# Patient Record
Sex: Male | Born: 1950
Health system: Southern US, Community
[De-identification: ages and names within clinical notes are randomized; demographics above are authoritative.]

## PROBLEM LIST (undated history)

## (undated) DIAGNOSIS — E785 Hyperlipidemia, unspecified: Secondary | ICD-10-CM

## (undated) DIAGNOSIS — G4730 Sleep apnea, unspecified: Secondary | ICD-10-CM

## (undated) DIAGNOSIS — M6530 Trigger finger, unspecified finger: Secondary | ICD-10-CM

## (undated) DIAGNOSIS — I10 Essential (primary) hypertension: Secondary | ICD-10-CM

## (undated) DIAGNOSIS — D563 Thalassemia minor: Secondary | ICD-10-CM

## (undated) DIAGNOSIS — R9439 Abnormal result of other cardiovascular function study: Secondary | ICD-10-CM

## (undated) DIAGNOSIS — Z972 Presence of dental prosthetic device (complete) (partial): Secondary | ICD-10-CM

## (undated) DIAGNOSIS — Z955 Presence of coronary angioplasty implant and graft: Secondary | ICD-10-CM

## (undated) DIAGNOSIS — F0390 Unspecified dementia without behavioral disturbance: Secondary | ICD-10-CM

## (undated) DIAGNOSIS — N183 Chronic kidney disease, stage 3 unspecified: Secondary | ICD-10-CM

## (undated) DIAGNOSIS — I2510 Atherosclerotic heart disease of native coronary artery without angina pectoris: Secondary | ICD-10-CM

## (undated) DIAGNOSIS — I493 Ventricular premature depolarization: Secondary | ICD-10-CM

## (undated) DIAGNOSIS — Z974 Presence of external hearing-aid: Secondary | ICD-10-CM

## (undated) HISTORY — DX: Essential (primary) hypertension: I10

## (undated) HISTORY — PX: APPENDECTOMY: SHX54

## (undated) HISTORY — PX: JOINT REPLACEMENT: SHX530

## (undated) HISTORY — PX: CORONARY ARTERY BYPASS GRAFT: SHX141

## (undated) HISTORY — PX: VASECTOMY: SHX75

## (undated) HISTORY — DX: Trigger finger, unspecified finger: M65.30

## (undated) HISTORY — DX: Abnormal result of other cardiovascular function study: R94.39

## (undated) HISTORY — PX: CARPAL TUNNEL RELEASE: SHX101

## (undated) HISTORY — DX: Atherosclerotic heart disease of native coronary artery without angina pectoris: I25.10

## (undated) HISTORY — PX: REPLACEMENT TOTAL KNEE: SUR1224

## (undated) HISTORY — DX: Presence of coronary angioplasty implant and graft: Z95.5

## (undated) HISTORY — PX: EYE SURGERY: SHX253

## (undated) HISTORY — PX: TONSILLECTOMY: SUR1361

## (undated) HISTORY — DX: Hyperlipidemia, unspecified: E78.5

---

## 2007-12-08 HISTORY — PX: CORONARY ANGIOPLASTY: SHX604

## 2008-07-07 ENCOUNTER — Ambulatory Visit: Payer: Self-pay | Admitting: Internal Medicine

## 2008-07-11 ENCOUNTER — Ambulatory Visit: Payer: Self-pay | Admitting: Internal Medicine

## 2008-07-21 ENCOUNTER — Ambulatory Visit: Payer: Self-pay | Admitting: Internal Medicine

## 2008-07-28 ENCOUNTER — Ambulatory Visit: Payer: Self-pay | Admitting: Internal Medicine

## 2009-04-06 ENCOUNTER — Encounter: Payer: Self-pay | Admitting: Internal Medicine

## 2009-04-08 ENCOUNTER — Encounter: Payer: Self-pay | Admitting: Internal Medicine

## 2009-04-19 ENCOUNTER — Ambulatory Visit: Payer: Self-pay | Admitting: Internal Medicine

## 2009-04-25 ENCOUNTER — Ambulatory Visit: Payer: Self-pay | Admitting: Oncology

## 2009-05-03 LAB — MORPHOLOGY - CHCC SATELLITE

## 2009-05-03 LAB — CBC WITH DIFFERENTIAL (CANCER CENTER ONLY)
BASO#: 0 10*3/uL (ref 0.0–0.2)
EOS%: 2.8 % (ref 0.0–7.0)
HGB: 12.4 g/dL — ABNORMAL LOW (ref 13.0–17.1)
LYMPH#: 1.8 10*3/uL (ref 0.9–3.3)
MCH: 21.8 pg — ABNORMAL LOW (ref 28.0–33.4)
MCHC: 33.6 g/dL (ref 32.0–35.9)
MONO%: 7.3 % (ref 0.0–13.0)
NEUT#: 3.6 10*3/uL (ref 1.5–6.5)
Platelets: 263 10*3/uL (ref 145–400)
RBC: 5.69 10*6/uL (ref 4.20–5.70)

## 2009-05-03 LAB — CMP (CANCER CENTER ONLY)
Alkaline Phosphatase: 96 U/L — ABNORMAL HIGH (ref 26–84)
BUN, Bld: 14 mg/dL (ref 7–22)
Creat: 0.8 mg/dl (ref 0.6–1.2)
Glucose, Bld: 105 mg/dL (ref 73–118)
Total Bilirubin: 0.6 mg/dl (ref 0.20–1.60)

## 2009-05-07 LAB — IRON AND TIBC
%SAT: 23 % (ref 20–55)
Iron: 71 ug/dL (ref 42–165)
TIBC: 306 ug/dL (ref 215–435)
UIBC: 235 ug/dL

## 2009-05-07 LAB — HEMOGLOBINOPATHY EVALUATION
Hemoglobin Other: 0 % (ref 0.0–0.0)
Hgb A2 Quant: 5.2 % — ABNORMAL HIGH (ref 2.2–3.2)
Hgb A: 92.3 % — ABNORMAL LOW (ref 96.8–97.8)
Hgb S Quant: 0 % (ref 0.0–0.0)

## 2009-05-07 LAB — FERRITIN: Ferritin: 25 ng/mL (ref 22–322)

## 2009-05-09 ENCOUNTER — Encounter: Payer: Self-pay | Admitting: Internal Medicine

## 2009-05-15 ENCOUNTER — Inpatient Hospital Stay (HOSPITAL_COMMUNITY): Admission: RE | Admit: 2009-05-15 | Discharge: 2009-05-18 | Payer: Self-pay | Admitting: Orthopedic Surgery

## 2009-07-31 ENCOUNTER — Ambulatory Visit: Payer: Self-pay | Admitting: Oncology

## 2009-08-07 LAB — CBC WITH DIFFERENTIAL (CANCER CENTER ONLY)
BASO#: 0 10*3/uL (ref 0.0–0.2)
BASO%: 0.6 % (ref 0.0–2.0)
EOS%: 2.9 % (ref 0.0–7.0)
LYMPH#: 2 10*3/uL (ref 0.9–3.3)
MCH: 21.7 pg — ABNORMAL LOW (ref 28.0–33.4)
MCHC: 32.3 g/dL (ref 32.0–35.9)
MONO%: 8.4 % (ref 0.0–13.0)
NEUT#: 3.7 10*3/uL (ref 1.5–6.5)
NEUT%: 57 % (ref 40.0–80.0)
RDW: 15.6 % — ABNORMAL HIGH (ref 10.5–14.6)

## 2009-08-07 LAB — FERRITIN: Ferritin: 13 ng/mL — ABNORMAL LOW (ref 22–322)

## 2009-08-07 LAB — IRON AND TIBC
Iron: 47 ug/dL (ref 42–165)
UIBC: 265 ug/dL

## 2009-08-21 ENCOUNTER — Ambulatory Visit: Payer: Self-pay | Admitting: Internal Medicine

## 2009-09-11 ENCOUNTER — Encounter: Payer: Self-pay | Admitting: Cardiovascular Disease

## 2009-09-12 ENCOUNTER — Encounter: Payer: Self-pay | Admitting: Cardiovascular Disease

## 2009-11-16 ENCOUNTER — Ambulatory Visit: Payer: BC Managed Care – PPO | Admitting: Internal Medicine

## 2009-11-19 ENCOUNTER — Encounter: Payer: Self-pay | Admitting: Cardiovascular Disease

## 2009-11-22 DIAGNOSIS — E785 Hyperlipidemia, unspecified: Secondary | ICD-10-CM | POA: Insufficient documentation

## 2009-11-22 DIAGNOSIS — I10 Essential (primary) hypertension: Secondary | ICD-10-CM | POA: Insufficient documentation

## 2009-11-22 DIAGNOSIS — I2510 Atherosclerotic heart disease of native coronary artery without angina pectoris: Secondary | ICD-10-CM | POA: Insufficient documentation

## 2009-12-06 ENCOUNTER — Telehealth: Payer: Self-pay | Admitting: Cardiovascular Disease

## 2010-01-02 ENCOUNTER — Encounter: Payer: Self-pay | Admitting: Cardiovascular Disease

## 2010-01-03 ENCOUNTER — Encounter: Payer: Self-pay | Admitting: Cardiovascular Disease

## 2010-01-07 ENCOUNTER — Ambulatory Visit: Payer: Self-pay | Admitting: Cardiovascular Disease

## 2010-01-16 ENCOUNTER — Encounter: Payer: Self-pay | Admitting: Cardiovascular Disease

## 2010-01-24 ENCOUNTER — Ambulatory Visit: Payer: BC Managed Care – PPO | Admitting: Gastroenterology

## 2010-02-12 ENCOUNTER — Telehealth: Payer: Self-pay | Admitting: Cardiovascular Disease

## 2010-02-22 ENCOUNTER — Ambulatory Visit: Payer: Self-pay | Admitting: Oncology

## 2010-03-19 ENCOUNTER — Emergency Department: Payer: BC Managed Care – PPO | Admitting: Emergency Medicine

## 2010-03-29 ENCOUNTER — Encounter: Payer: Self-pay | Admitting: Cardiovascular Disease

## 2010-04-17 ENCOUNTER — Ambulatory Visit: Payer: Self-pay | Admitting: Cardiovascular Disease

## 2010-05-08 ENCOUNTER — Telehealth: Payer: Self-pay | Admitting: Cardiovascular Disease

## 2010-05-23 ENCOUNTER — Telehealth: Payer: Self-pay | Admitting: Cardiovascular Disease

## 2010-05-23 ENCOUNTER — Ambulatory Visit: Payer: BC Managed Care – PPO | Admitting: Internal Medicine

## 2010-05-31 ENCOUNTER — Ambulatory Visit: Payer: BC Managed Care – PPO | Admitting: Internal Medicine

## 2010-08-16 ENCOUNTER — Telehealth: Payer: Self-pay | Admitting: Cardiovascular Disease

## 2010-08-19 ENCOUNTER — Encounter: Payer: Self-pay | Admitting: Cardiovascular Disease

## 2010-08-21 ENCOUNTER — Ambulatory Visit: Payer: Self-pay | Admitting: Cardiovascular Disease

## 2010-08-22 ENCOUNTER — Encounter: Payer: Self-pay | Admitting: Cardiovascular Disease

## 2010-08-23 LAB — CONVERTED CEMR LAB
ALT: 29 units/L (ref 0–53)
Bilirubin, Direct: 0.2 mg/dL (ref 0.0–0.3)
Cholesterol: 136 mg/dL (ref 0–200)
Indirect Bilirubin: 0.8 mg/dL (ref 0.0–0.9)
LDL Cholesterol: 80 mg/dL (ref 0–99)
Total Bilirubin: 1 mg/dL (ref 0.3–1.2)
Total CHOL/HDL Ratio: 3
VLDL: 11 mg/dL (ref 0–40)

## 2010-10-08 NOTE — Assessment & Plan Note (Signed)
Summary: ec6   CC:  ROV; Diaphoresis-last episode 1 month ago.  History of Present Illness: [name] Is a very pleasant 60 year old gentleman last seen by myself at [facility] January 2011 who has a history of coronary artery disease, bare-metal stents placed to his proximal RCA and LAD in April 2009, history of total knee replacements bilaterally, hypertension, hyperlipidemia, anxiety and ADHD who presents for routine followup.  He states that overall he's been doing well. He has been having periods of nausea starting in the morning lasting until noon. He had an ultrasound which was negative per his report. Zofran did not seem to help his symptoms. He had a low fat snack with his Niaspan before bed and this seemed to help his symptoms.  He also states that he has had episodes of sweating, across his back and underneath his pectoral region and sometimes on his face and in his care. This comes on after a argument with his wife typically over the phone and not with exertion. He does have a history of anxiety. He takes Klonopin for his symptoms on a p.r.n. basis.  He has been walking without chest pain, right his bike that was low resistance with no symptoms.   Problems Prior to Update: 1)  Hyperlipidemia-mixed  (ICD-272.4) 2)  Hypertension, Benign  (ICD-401.1) 3)  Cad, Native Vessel  (ICD-414.01)  Medications Prior to Update: 1)  Plavix 75 Mg Tabs (Clopidogrel Bisulfate) .... Take One Tablet By Mouth Daily 2)  Lipitor 40 Mg Tabs (Atorvastatin Calcium) .... Take One Tablet By Mouth Daily.  Current Medications (verified): 1)  Plavix 75 Mg Tabs (Clopidogrel Bisulfate) .... Take One Tablet By Mouth Daily 2)  Lipitor 40 Mg Tabs (Atorvastatin Calcium) .... Take One Tablet By Mouth Every Other Day Take Half Tablet The Other Days 3)  Niaspan 1000 Mg Cr-Tabs (Niacin (Antihyperlipidemic)) .... Take 1 By Mouth At Bedtime 4)  Requip 0.25 Mg Tabs (Ropinirole Hcl) .... Take  3 By Mouth Once Daily 5)  Wellbutrin Sr 200 Mg Xr12h-Tab (Bupropion Hcl) .... Take 1 By Mouth Once Daily 6)  Wellbutrin Xl 150 Mg Xr24h-Tab (Bupropion Hcl) .... Take 1 By Mouth in The Afternoon 7)  Atenolol 25 Mg Tabs (Atenolol) .... Take Half  Tablet By Mouth Daily 8)  Aspirin 81 Mg Tbec (Aspirin) .... Take One Tablet By Mouth Daily 9)  Caltrate 600+d Plus 600-400 Mg-Unit Tabs (Calcium Carbonate-Vit D-Min) .... Take 2 By Mouth Once Daily  Allergies (verified): 1)  ! Penicillin 2)  ! Codeine 3)  ! Biaxin 4)  ! * Ivp Dye  Past History:  Past Medical History: Last updated: 11/22/2009 CAD Bare metal stents placed to his proximal RCA adn LAD in April 2009 Hyperlipidemia Hypertension stress test Jan 2010 no significant ischemia  Past Surgical History: Last updated: 11/22/2009 total knee replacements  Review of Systems  The patient denies fever, weight loss, weight gain, vision loss, decreased hearing, hoarseness, chest pain, syncope, dyspnea on exertion, peripheral edema, prolonged cough, abdominal pain, incontinence, muscle weakness, depression, and enlarged lymph nodes.         Sweating after stressful phone calls and situations, nausea in Am now resolved.  Vital Signs:  Patient profile:   60 year old male Height:      64 inches Weight:      180 pounds BMI:     31.01 Pulse rate:   69 / minute BP sitting:   118 / 68  (left arm) Cuff size:  regular  Vitals Entered By: Stanton Kidney, EMT-P (Jan 07, 2010 11:40 AM)  Physical Exam  General:  Well-appearing middle-aged gentleman in no apparent distress, HEENT exam is benign, oropharynx is clear, neck is supple with no JVP or carotid bruits, heart sounds are regular with S1-S2 and no murmurs appreciated, lungs are clear to auscultation with no wheezes or rales, abdominal exam is benign with no widened aortic pulsation, no significant lower extremity edema, neurologic exam is grossly nonfocal, skin is warm and  dry.    EKG  Procedure date:  01/07/2010  Findings:      Maisie Fus has rhythm with rate 69 beats per minute, right bundle branch block, left anterior fascicular block. This was previously seen on old EKGs dated January 2000 and  Impression & Recommendations:  Problem # 1:  CAD, NATIVE VESSEL (ICD-414.01) hof coronary artery disease with bare-metal stents placed 2 proximal RCA and LAD in April 2009. Negative stress test in January 2010. His Lipitor had been cut back in the past 2 to a low LDL and low testosterone. We'll try to obtain his most recent labs from Dr. Darrick Huntsman. He will likely be due for a repeat cholesterol check and possibly a repeat testosterone level. If his testosterone level continues to be very low and LDL within adequate range, we could consider decreasing his Lipitor to 10 mg daily.  We did talk about his ADHD and given his underlying anxiety and nervousness about his coronary disease and any sweating and shortness of breath, we have suggested that he try alternatives to ADHD medication. As a last resort if he does need ADHD medication, perhaps we could use very low doses as he does get very symptomatic from elevated heart rates and this may likely cause undue stress and worry about his underlying coronary disease. I would agree with the plan to advance his Wellbutrin, increase his exercise, possibly even try other antianxiety medications.  For GERD, it would be safe to restart a proton pump inhibitor and we will change his Plavix to effient 10 mg daily.   His updated medication list for this problem includes:    Plavix 75 Mg Tabs (Clopidogrel bisulfate) ..... Hold    Atenolol 25 Mg Tabs (Atenolol) .Marland Kitchen... Take half  tablet by mouth daily    Aspirin 81 Mg Tbec (Aspirin) .Marland Kitchen... Take one tablet by mouth daily    Effient 10 Mg Tabs (Prasugrel hcl) .Marland Kitchen... Take 1 tablet by mouth once a day  Problem # 2:  HYPERTENSION, BENIGN (ICD-401.1) Blood pressure is well controlled on his half  dose of atenolol daily, 12.5 mg daily.he has not been taking his lisinopril as his blood pressure has been controlled.  His updated medication list for this problem includes:    Atenolol 12.5 Mg Tabs (Atenolol) .Marland Kitchen... Take half  tablet by mouth daily    Aspirin 81 Mg Tbec (Aspirin) .Marland Kitchen... Take one tablet by mouth daily  Problem # 3:  HYPERLIPIDEMIA-MIXED (ICD-272.4) LDL was low I do not have his actual lab values are available at this time as he is just joined the office at [facility]. We'll try to obtain his most recent lipid panel from Dr. to low or from [facility]. I suspect that we'll need to check his lipid panel again soon on a lower dose of Lipitor. He does take Niaspan 2000 mg daily. If he continues to have episodes of sweating, nausea, we could try to cut back his Niaspan to 1000 mg a day  or try 1500 mg.  His updated medication list for this problem includes:    Lipitor 40 Mg Tabs (Atorvastatin calcium) .Marland Kitchen... Take one tablet by mouth every other day take half tablet the other days    Niaspan 2000 Mg Cr-tabs (Niacin (antihyperlipidemic)) .Marland Kitchen... Take 1 by mouth at bedtime  Patient Instructions: 1)  Your physician recommends that you schedule a follow-up appointment in: 6 months. 2)  Your physician has recommended you make the following change in your medication: Stop your Plavix, and start on Effient 10mg  once daily. A prescription has been [facility] to [facility] pharmacy [facility]. Prescriptions: EFFIENT 10 MG TABS (PRASUGREL HCL) Take 1 tablet by mouth once a day  #30 x 6   Entered by:   Cloyde Reams RN   Authorized by:   Dossie Arbour MD   Signed by:   Cloyde Reams RN on 01/07/2010   Method used:   [facility]        [facility]  [place] (retail)       [address]       [place], [place]  [address]       Ph: [phone] or [phone]       Fax: [phone]   RxID:   [id number]

## 2010-10-08 NOTE — Miscellaneous (Signed)
Summary: plavix  Clinical Lists Changes  Medications: Added new medication of PLAVIX 75 MG TABS (CLOPIDOGREL BISULFATE) Take one tablet by mouth daily - Signed Rx of PLAVIX 75 MG TABS (CLOPIDOGREL BISULFATE) Take one tablet by mouth daily;  #30 x 11;  Signed;  Entered by: Charlena Cross, RN, BSN;  Authorized by: Dossie Arbour MD;  Method used: [facility]  [place], [place], [place]  [address], Ph: [phone] or [phone], Fax: [phone]    Prescriptions: PLAVIX 75 MG TABS (CLOPIDOGREL BISULFATE) Take one tablet by mouth daily  #30 x 11   Entered by:   Charlena Cross, RN, BSN   Authorized by:   Dossie Arbour MD   Signed by:   Charlena Cross, RN, BSN on 11/19/2009   Method used:   [facility]ronically [facility]        [facility]  [place] (retail)       [address]       [place], [place]  [address]       Ph: [phone] or [phone]       Fax: [phone]   RxID:   [id number]

## 2010-10-08 NOTE — Progress Notes (Signed)
Summary: LAB  Phone Note Call from Patient Call back at Home Phone [phone]   Caller: SELF Call For: [facility] Summary of Call: WAITING TO HEAR BACK ABOUT CHOLESTEROL LABS THAT TULLO DREW-WAS TOLD BY Mariah Milling THAT THEY WOULD HEAR FROM OUR OFFICE  Initial call taken by: Harlon Flor,  February 12, 2010 3:17 PM  Follow-up for Phone Call        called pt informed them that the results were not available yet but i would contact Dr. Melina Schools office again.  Follow-up by: Benedict Needy, RN,  February 12, 2010 4:42 PM

## 2010-10-08 NOTE — Progress Notes (Signed)
Summary: SWEATS  Phone Note Call from Patient Call back at Home Phone [phone]   Caller: SELF Call For: [facility] Summary of Call: PT IS GETTING SWEATS AND DOESNT KNOW IF IT ANXIETY Initial call taken by: Harlon Flor,  December 06, 2009 4:12 PM  Follow-up for Phone Call        Left message to call. Charlena Cross RN BSN  Follow-up by: Charlena Cross, RN, BSN,  December 06, 2009 4:50 PM  Additional Follow-up for Phone Call Additional follow up Details #1::        Patient's wife called back and would like for RN to call her today    Additional Follow-up for Phone Call Additional follow up Details #2::    pt would like to be seen by dr Mariah Milling sooner.  pcp wants to start pt on new medications and pt has questions.  also having panic attacks that have been associated with sweating.  Follow-up by: Charlena Cross, RN, BSN,  December 10, 2009 12:20 PM

## 2010-10-08 NOTE — Assessment & Plan Note (Signed)
Summary: ROV   Visit Type:  Follow-up Primary Provider:  Dr. Darrick Huntsman  CC:  c/o mood swings, personality changes, and night insomnia and occas. chest pain with anxiety spells..  History of Present Illness: Mr Formby Is a very pleasant 60 year old gentleman who has a history of coronary artery disease, bare-metal stents placed to his proximal RCA and LAD in April 2009, history of total knee replacements bilaterally, hypertension, hyperlipidemia, anxiety and ADHD who presents for routine followup.  he reports having recent significant problems with his supplemental testosterone. It caused mood swings, irritation, lashing out at his wife. Since he has stopped the testosterone, his symptoms have improved though he reports that he does not feel quite right. He is exercising periodically with his bike but not on a consistent basis. His wife on the other hand has lost significant weight and is exercising for an hour at a time. He does have sweating when he exercises but this is nothing new for him. He denies chest pain, shortness of breath. Otherwise he has no complaints.He is due to see behavioral health and an associate physician. They will look at his medications to determine if any of his current medications might be affecting his behavioral health   He does have a history of anxiety. He takes Klonopin for his symptoms on a p.r.n. basis.    Current Medications (verified): 1)  Niaspan 1000 Mg Cr-Tabs (Niacin (Antihyperlipidemic)) .... Take Two Tablets Daily 2)  Requip 0.25 Mg Tabs (Ropinirole Hcl) .... Take 3 By Mouth Once Daily 3)  Wellbutrin Sr 200 Mg Xr12h-Tab (Bupropion Hcl) .... Take 1 By Mouth Once Daily 4)  Wellbutrin Xl 150 Mg Xr24h-Tab (Bupropion Hcl) .... Take 1 By Mouth in The Afternoon 5)  Atenolol 25 Mg Tabs (Atenolol) .... Take Half  Tablet By Mouth Daily 6)  Aspirin 81 Mg Tbec (Aspirin) .... Take One Tablet By Mouth Daily 7)  Caltrate 600+d Plus 600-400 Mg-Unit Tabs (Calcium  Carbonate-Vit D-Min) .... Take 2 By Mouth Once Daily 8)  Effient 10 Mg Tabs (Prasugrel Hcl) .... Take One Tablet By Mouth Daily 9)  Lipitor 20 Mg Tabs (Atorvastatin Calcium) .Marland Kitchen.. 1 Tablet At Bedtime 10)  Protonix 40 Mg Tbec (Pantoprazole Sodium) .Marland Kitchen.. 1 Once Daily 11)  Uroxatral 10 Mg Xr24h-Tab (Alfuzosin Hcl) .Marland Kitchen.. 1 Once Daily 12)  Klonopin 0.5 Mg Tabs (Clonazepam) .... One To Two Tablets Once Daily  Allergies (verified): 1)  ! Penicillin 2)  ! Codeine 3)  ! Biaxin 4)  ! * Ivp Dye  Past History:  Past Medical History: Last updated: 11/22/2009 CAD Bare metal stents placed to his proximal RCA adn LAD in April 2009 Hyperlipidemia Hypertension stress test Jan 2010 no significant ischemia  Past Surgical History: Last updated: 11/22/2009 total knee replacements  Review of Systems  The patient denies fever, weight loss, weight gain, vision loss, decreased hearing, hoarseness, chest pain, syncope, dyspnea on exertion, peripheral edema, prolonged cough, abdominal pain, incontinence, muscle weakness, depression, and enlarged lymph nodes.         mood swings, anxiety,irritable  Vital Signs:  Patient profile:   60 year old male Height:      64 inches Weight:      175.75 pounds BMI:     30.28 Pulse rate:   68 / minute BP sitting:   105 / 66  (left arm) Cuff size:   regular  Vitals Entered By: Bishop Dublin, CMA (April 17, 2010 2:33 PM)  Physical Exam  General:  Well-appearing middle-aged gentleman in  no apparent distress, HEENT exam is benign, oropharynx is clear, neck is supple with no JVP or carotid bruits, heart sounds are regular with S1-S2 and no murmurs appreciated, lungs are clear to auscultation with no wheezes or rales, abdominal exam is benign with no widened aortic pulsation, no significant lower extremity edema, neurologic exam is grossly nonfocal, skin is warm and dry.   Impression & Recommendations:  Problem # 1:  CAD, NATIVE VESSEL (ICD-414.01) no symptoms  chest pain or worsening shortness of breath concerning for angina. I have encouraged him to increase his biking to several times per week. This may also help with his mood and sleep. He is tolerating his medications well with no symptoms.  The following medications were removed from the medication list:    Plavix 75 Mg Tabs (Clopidogrel bisulfate) ..... Hold His updated medication list for this problem includes:    Atenolol 25 Mg Tabs (Atenolol) .Marland Kitchen... Take half  tablet by mouth daily    Aspirin 81 Mg Tbec (Aspirin) .Marland Kitchen... Take one tablet by mouth daily    Effient 10 Mg Tabs (Prasugrel hcl) .Marland Kitchen... Take one tablet by mouth daily  Problem # 2:  HYPERLIPIDEMIA-MIXED (ICD-272.4) He is currently taking Lipitor 20. We have suggested he could decrease his Niaspan to 1000 mg daily as he is concerned about some medication side effects. His LDL has previously been well controlled and we will check his cholesterol in one month's time. ideally call LDL should be less than 70.  The following medications were removed from the medication list:    Lipitor 40 Mg Tabs (Atorvastatin calcium) .Marland Kitchen... Take one tablet by mouth every other day take half tablet the other days His updated medication list for this problem includes:    Niaspan 1000 Mg Cr-tabs (Niacin (antihyperlipidemic)) .Marland Kitchen... Take two tablets daily    Lipitor 20 Mg Tabs (Atorvastatin calcium) .Marland Kitchen... 1 tablet at bedtime  Problem # 3:  HYPERTENSION, BENIGN (ICD-401.1) Blood pressure has been reasonably well controlled on his current medication regimen.  His updated medication list for this problem includes:    Atenolol 25 Mg Tabs (Atenolol) .Marland Kitchen... Take half  tablet by mouth daily    Aspirin 81 Mg Tbec (Aspirin) .Marland Kitchen... Take one tablet by mouth daily  Patient Instructions: 1)  Your physician recommends that you return for a FASTING lipid profile: LIP/LFT in 1 month  2)  Your physician wants you to follow-up in:   12 months You will receive a reminder  letter in the mail two months in advance. If you don't receive a letter, please call our office to schedule the follow-up appointment.

## 2010-10-08 NOTE — Consult Note (Signed)
Summary: Consultation Report  Consultation Report   Imported By: [place] 04/01/2010 08:07:22  _____________________________________________________________________  External Attachment:    Type:   Image     Comment:   External Document

## 2010-10-08 NOTE — Progress Notes (Signed)
Summary: RELEASE  RELEASE   Imported By: Frazier Butt Chriscoe 01/03/2010 11:52:23  _____________________________________________________________________  External Attachment:    Type:   Image     Comment:   External Document

## 2010-10-08 NOTE — Miscellaneous (Signed)
Summary: lipitor  Clinical Lists Changes  Medications: Added new medication of LIPITOR 40 MG TABS (ATORVASTATIN CALCIUM) Take one tablet by mouth daily. - Signed Rx of LIPITOR 40 MG TABS (ATORVASTATIN CALCIUM) Take one tablet by mouth daily.;  #30 x 6;  Signed;  Entered by: Charlena Cross, RN, BSN;  Authorized by: Dossie Arbour MD;  Method used: [facility]  [place], [place], [place]  [address], Ph: [phone] or [phone], Fax: [phone]    Prescriptions: LIPITOR 40 MG TABS (ATORVASTATIN CALCIUM) Take one tablet by mouth daily.  #30 x 6   Entered by:   Charlena Cross, RN, BSN   Authorized by:   Dossie Arbour MD   Signed by:   Charlena Cross, RN, BSN on 01/02/2010   Method used:   Electronically to        [facility]  [place] (retail)       [address]       [place], [place]  [address]       Ph: [phone] or [phone]       Fax: [phone]   RxID:   [id number]

## 2010-10-08 NOTE — Progress Notes (Signed)
Summary: STENTS  Phone Note Call from Patient Call back at Home Phone [phone]   Caller: WIFE Call For: [facility] Summary of Call: WAS SENT TO [facility] FOR AN MRI BY DR TULLO-NEEDS INFORMATION ABOUT HIS STENTS-PER [facility], WE NEED TO CALL [facility] AND GIVE THEM THE INFORMATION OVER THE PHONE ABOUT WHAT KIND OF STENTS HE HAS Initial call taken by: Harlon Flor,  May 23, 2010 11:15 AM  Follow-up for Phone Call        [facility] at [facility] in MRI department Benedict Needy, RN  May 23, 2010 2:40 PM   Our records only name the stent as bare metal. [facility] need the brand and model they are in contact with [facility] where the stent was placed Benedict Needy, RN  May 24, 2010 10:51 AM  Follow-up by: Benedict Needy, RN,  May 24, 2010 10:51 AM

## 2010-10-08 NOTE — Letter (Signed)
Summary: [facility]  [facility]   Imported By: Harlon Flor 01/17/2010 11:33:51  _____________________________________________________________________  External Attachment:    Type:   Image     Comment:   External Document

## 2010-10-08 NOTE — Letter (Signed)
Summary: PHI  PHI   Imported By: Harlon Flor 01/14/2010 14:23:18  _____________________________________________________________________  External Attachment:    Type:   Image     Comment:   External Document

## 2010-10-08 NOTE — Letter (Signed)
Summary: [facility]  [facility]   Imported By: Harlon Flor 01/14/2010 14:55:02  _____________________________________________________________________  External Attachment:    Type:   Image     Comment:   External Document

## 2010-10-08 NOTE — Progress Notes (Signed)
Summary: MEDICATIONS  Phone Note Call from Patient Call back at Home Phone [phone]   Caller: SELF Call For: [facility] Summary of Call: PT IS HAVING BRUISING IN ARM-SHOULD HE CONTINUE TO TAKE BAYER ASPIRIN Initial call taken by: Harlon Flor,  May 08, 2010 10:05 AM  Follow-up for Phone Call        Pt has noticed increased bruising on arms. Pt wanted to know if he should stop taking aspirin. With pt's hx of stents would not advise pt to stop ASA at this time. Asked pt to call  office to report any black or blood in stools.  Follow-up by: Benedict Needy, RN,  May 08, 2010 10:18 AM

## 2010-10-10 NOTE — Miscellaneous (Signed)
  Clinical Lists Changes  Medications: Added new medication of NITROSTAT 0.4 MG SUBL (NITROGLYCERIN) take as directed - Signed Rx of NITROSTAT 0.4 MG SUBL (NITROGLYCERIN) take as directed;  #25 x 3;  Signed;  Entered by: Lysbeth Galas CMA;  Authorized by: Dossie Arbour MD;  Method used: [facility]  [place] [place], [place]  [address], Ph: [phone] or [phone], Fax: [phone]    Prescriptions: NITROSTAT 0.4 MG SUBL (NITROGLYCERIN) take as directed  #25 x 3   Entered by:   Lysbeth Galas CMA   Authorized by:   Dossie Arbour MD   Signed by:   Lysbeth Galas CMA on 08/19/2010   Method used:   Electronically to        [facility]  [place] (retail)       [address]       [place], [place]  [address]       Ph: [phone] or [phone]       Fax: [phone]   RxID:   [id number]

## 2010-10-10 NOTE — Progress Notes (Signed)
  Phone Note Refill Request Message from:  Fax from Pharmacy on August 16, 2010 8:40 AM  Refills Requested: Medication #1:  nitroglycerin 0.4mg  not in patient's medication list. Do you want to refill?  Initial call taken by: Lysbeth Galas CMA,  August 16, 2010 8:41 AM  Follow-up for Phone Call        ok to presribe NTG SL 0.4 mg  1 bottle , 25 tabs     Appended Document:  pt's [facility] [place]

## 2010-10-16 ENCOUNTER — Ambulatory Visit: Payer: BC Managed Care – PPO | Admitting: Internal Medicine

## 2010-11-06 ENCOUNTER — Ambulatory Visit: Payer: BC Managed Care – PPO | Admitting: Internal Medicine

## 2010-11-27 ENCOUNTER — Other Ambulatory Visit: Payer: BC Managed Care – PPO | Admitting: Internal Medicine

## 2010-12-13 LAB — ABO/RH: ABO/RH(D): B POS

## 2010-12-13 LAB — PROTIME-INR
INR: 1.1 (ref 0.00–1.49)
INR: 2.2 — ABNORMAL HIGH (ref 0.00–1.49)
Prothrombin Time: 14 seconds (ref 11.6–15.2)
Prothrombin Time: 20.9 seconds — ABNORMAL HIGH (ref 11.6–15.2)
Prothrombin Time: 23.8 seconds — ABNORMAL HIGH (ref 11.6–15.2)

## 2010-12-13 LAB — BASIC METABOLIC PANEL
BUN: 10 mg/dL (ref 6–23)
CO2: 27 mEq/L (ref 19–32)
Calcium: 8.5 mg/dL (ref 8.4–10.5)
Chloride: 105 mEq/L (ref 96–112)
Creatinine, Ser: 0.84 mg/dL (ref 0.4–1.5)
GFR calc Af Amer: >60 mL/min (ref >60)
GFR calc Af Amer: >60 mL/min (ref >60)
GFR calc non Af Amer: >60 mL/min (ref >60)
GFR calc non Af Amer: >60 mL/min (ref >60)
Potassium: 3.4 mEq/L — ABNORMAL LOW (ref 3.5–5.1)
Sodium: 138 mEq/L (ref 135–145)

## 2010-12-13 LAB — CBC
HCT: 29.7 % — ABNORMAL LOW (ref 39.0–52.0)
Hemoglobin: 10.2 g/dL — ABNORMAL LOW (ref 13.0–17.0)
MCHC: 32 g/dL (ref 30.0–36.0)
Platelets: 166 10*3/uL (ref 150–400)
RBC: 4.2 MIL/uL — ABNORMAL LOW (ref 4.22–5.81)
RBC: 4.59 MIL/uL (ref 4.22–5.81)
WBC: 10 10*3/uL (ref 4.0–10.5)
WBC: 12.9 10*3/uL — ABNORMAL HIGH (ref 4.0–10.5)

## 2010-12-13 LAB — TYPE AND SCREEN
ABO/RH(D): B POS
Antibody Screen: NEGATIVE

## 2010-12-14 LAB — DIFFERENTIAL
Basophils Absolute: 0 10*3/uL (ref 0.0–0.1)
Eosinophils Relative: 2 % (ref 0–5)
Lymphocytes Relative: 27 % (ref 12–46)
Neutro Abs: 3.8 10*3/uL (ref 1.7–7.7)
Neutrophils Relative %: 64 % (ref 43–77)

## 2010-12-14 LAB — BASIC METABOLIC PANEL
BUN: 10 mg/dL (ref 6–23)
Calcium: 9.3 mg/dL (ref 8.4–10.5)
GFR calc non Af Amer: >60 mL/min (ref >60)
Glucose, Bld: 134 mg/dL — ABNORMAL HIGH (ref 70–99)

## 2010-12-14 LAB — URINALYSIS, ROUTINE W REFLEX MICROSCOPIC
Bilirubin Urine: NEGATIVE
Ketones, ur: NEGATIVE mg/dL
Nitrite: NEGATIVE
Specific Gravity, Urine: 1.022 (ref 1.005–1.030)
Urobilinogen, UA: 0.2 mg/dL (ref 0.0–1.0)

## 2010-12-14 LAB — CBC
Platelets: 214 10*3/uL (ref 150–400)
RDW: 15.3 % (ref 11.5–15.5)

## 2010-12-14 LAB — PROTIME-INR
INR: 1 (ref 0.00–1.49)
Prothrombin Time: 13.4 seconds (ref 11.6–15.2)

## 2010-12-14 LAB — APTT: aPTT: 28 seconds (ref 24–37)

## 2010-12-26 ENCOUNTER — Telehealth: Payer: Self-pay | Admitting: Emergency Medicine

## 2010-12-26 NOTE — Telephone Encounter (Signed)
Dr. Melina Schools office called today wondering if Mr. Edwin Woodward is cleared to exercise with a trainer from a cardiac standpoint.

## 2010-12-26 NOTE — Telephone Encounter (Signed)
Yes...like his wife..he can workout

## 2010-12-27 NOTE — Telephone Encounter (Signed)
Edwin Woodward notified/sab

## 2011-01-21 NOTE — H&P (Signed)
Edwin Woodward, Edwin Woodward            ACCOUNT NO.:  [id number]   MEDICAL RECORD NO.:  [id number]          PATIENT TYPE:  INP   LOCATION:  NA                           FACILITY:  [facility]   PHYSICIAN:  Madlyn Frankel. Charlann Boxer, M.D.  DATE OF BIRTH:  22-Jul-1951   DATE OF ADMISSION:  05/15/2009  DATE OF DISCHARGE:                              HISTORY & PHYSICAL   PLAN:  A right total knee replacement.   CHIEF COMPLAINT:  Right knee pain.   HISTORY OF PRESENT ILLNESS:  A 59 year old male with a history of right  knee pain secondary to osteoarthritis.  It has been refractory to all  conservative treatment.  He also has history of a left total knee  replacement in the past.  He has been presurgically assessed prior to  this knee replacement surgery.   PRIMARY CARE PHYSICIAN:  Dr. Duncan Dull.   CARDIOLOGIST:  Dr. Dossie Arbour.   HEMATOLOGIST:  Dr. Drue Second.   MEDICAL HISTORY:  1. Osteoarthritis.  2. Hypertension.  3. Coronary artery disease.  4. Hypercholesterolemia.  5. Heart stenting.  6. Anxiety/depression.  7. Reflux disease.  8. Irritable bowel syndrome.  9. Thalassemia.  10.Degenerative disk disease.  11.Osteoporosis.   PREVIOUS SURGERIES:  1. Appendectomy.  2. Tonsillectomy.  3. Carpal tunnel release.  4. Left total knee replacement.   DRUG ALLERGIES:  Include:  1. IRON.  2. PENICILLIN.  3. CODEINE CAUSES NAUSEA AND VOMITING.   MEDICATIONS:  1. Lipitor 40 mg p.o. daily.  2. Atenolol 12.5 mg p.o. daily.  3. Niacin 2000 mg daily.  4. Wellbutrin 150 mg p.o. b.i.d.  5. Klonopin 0.5 mg p.o. b.i.d.  6. Plavix 75 mg p.o. daily.  7. Ranitidine 150 mg p.o. b.i.d.  8. Enteric-coated aspirin 325 mg daily.  9. Zestril 5 mg p.r.n.  10.Nitrostat p.r.n.  11.Requip 0.75 mg daily.  12.Vitamin B6 at 100 mg 4 times a day.  13.Vitamin D3 400 units daily.  14.Calcium b.i.d.   REVIEW OF SYSTEMS:  GENERAL:  He has some memory loss and fatigue.  HEENT:  He has blackout spells and  hearing loss.  RESPIRATORY:  He has  allergies.  GENITOURINARY:  He has some incontinence and increased  urination at night.  MUSCULOSKELETAL:  He has joint pain, joint  swelling, back pain, muscular weakness.  Otherwise, see HPI.   PHYSICAL EXAMINATION:  Pulse 72.  Respirations 16.  Blood pressure  114/80.  GENERAL:  Awake, alert, and oriented.  HEENT:  Normocephalic.  NECK:  Supple.  No carotid bruits.  CHEST:  Lung sounds clear to auscultation bilaterally.  BREASTS:  Deferred.  HEART:  S1 and S2, distinct.  ABDOMEN:  Soft, nontender, nondistended.  Bowel sounds present.  PELVIS:  Stable.  GENITOURINARY:  Deferred.  EXTREMITIES:  Right knee, increased pain with weightbearing.  SKIN:  No cellulitis, right lower extremity.  NEUROLOGIC:  Intact distal sensibilities.   LABORATORY DATA:  Labs, EKG, and chest x-ray all pending.   IMPRESSION:  Right knee osteoarthritis.   PLAN OF ACTION:  A right total knee replacement by Dr. Charlann Boxer at [facility], May 15, 2009.  All questions were encouraged, answered, and  reviewed.   Patient cannot tolerate iron and is contraindicated due to his  thalassemia.  He will require clindamycin for antibiotics prior to  surgery as previous use of Ancef has caused allergic reaction.  He is  stopping his Plavix 5 days before surgery and is going to be bridging  with Lovenox.  We did discuss regional anesthesia for his knee  replacement.     ______________________________  Yetta Glassman. [address], [place]      Madlyn Frankel. Charlann Boxer, M.D.  Electronically Signed    BLM/MEDQ  D:  05/04/2009  T:  05/04/2009  Job:  [id number]   cc:   Duncan Dull, M.D.  Fax: [phone]   Antonieta Iba, MD  Fax: [phone]   Drue Second, MD  Fax: [phone]

## 2011-03-25 ENCOUNTER — Encounter: Payer: Self-pay | Admitting: Cardiovascular Disease

## 2011-04-09 ENCOUNTER — Ambulatory Visit: Payer: Self-pay | Admitting: Cardiovascular Disease

## 2011-04-11 ENCOUNTER — Ambulatory Visit (INDEPENDENT_AMBULATORY_CARE_PROVIDER_SITE_OTHER): Payer: Medicare Other | Admitting: Cardiovascular Disease

## 2011-04-11 ENCOUNTER — Encounter: Payer: Self-pay | Admitting: Cardiovascular Disease

## 2011-04-11 DIAGNOSIS — E669 Obesity, unspecified: Secondary | ICD-10-CM

## 2011-04-11 DIAGNOSIS — G4733 Obstructive sleep apnea (adult) (pediatric): Secondary | ICD-10-CM

## 2011-04-11 DIAGNOSIS — I2510 Atherosclerotic heart disease of native coronary artery without angina pectoris: Secondary | ICD-10-CM

## 2011-04-11 DIAGNOSIS — Z9989 Dependence on other enabling machines and devices: Secondary | ICD-10-CM | POA: Insufficient documentation

## 2011-04-11 DIAGNOSIS — I10 Essential (primary) hypertension: Secondary | ICD-10-CM

## 2011-04-11 DIAGNOSIS — E785 Hyperlipidemia, unspecified: Secondary | ICD-10-CM

## 2011-04-11 MED ORDER — PRASUGREL HCL 10 MG PO TABS: 10.0000 mg | ORAL_TABLET | Freq: Every day | ORAL | Status: DC

## 2011-04-11 MED ORDER — LISINOPRIL 10 MG PO TABS: 10.0000 mg | ORAL_TABLET | Freq: Every day | ORAL | Status: DC

## 2011-04-11 MED ORDER — ATORVASTATIN CALCIUM 40 MG PO TABS: 40.0000 mg | ORAL_TABLET | Freq: Every day | ORAL | Status: DC

## 2011-04-11 MED ORDER — NITROGLYCERIN 0.4 MG SL SUBL: 0.4000 mg | SUBLINGUAL_TABLET | SUBLINGUAL | Status: AC | PRN

## 2011-04-11 MED ORDER — ATENOLOL 25 MG PO TABS: 12.5000 mg | ORAL_TABLET | Freq: Every day | ORAL | Status: DC

## 2011-04-11 NOTE — Patient Instructions (Addendum)
You are doing well. No medication changes were made. Please call us if you have new issues that need to be addressed before your next appt.  We will call you for a follow up Appt. In 6 months  

## 2011-04-11 NOTE — Assessment & Plan Note (Signed)
We have encouraged him to be more compliant with his CPAP.

## 2011-04-11 NOTE — Assessment & Plan Note (Signed)
Cholesterol is at goal on the current lipid regimen. No changes to the medications were made.  

## 2011-04-11 NOTE — Assessment & Plan Note (Signed)
Blood pressure is well controlled on today's visit. No changes made to the medications. 

## 2011-04-11 NOTE — Assessment & Plan Note (Signed)
Currently with no symptoms of angina. No further workup at this time. Continue current medication regimen. 

## 2011-04-11 NOTE — Progress Notes (Signed)
Patient ID: Edwin Woodward, male    DOB: 11-Dec-1950, 60 y.o.   MRN: [id number]  HPI Comments: Edwin Woodward Is a very pleasant 60 year old gentleman who has a history of coronary artery disease, bare-metal stents placed to his proximal RCA and LAD in April 2009, history of total knee replacements bilaterally, hypertension, hyperlipidemia, anxiety and ADHD who presents for routine followup.   Overall he is doing well. His weight is up 5 pounds from his last visit in 2011. 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 He is exercising periodically. Is not eating a regular diet and eats sporadically. His wife does not think that he drinks enough and does not eat very well. He has had his anxiety medications changed and he believes this works better. Is not using his CPAP at nighttime for no particular reason.   Cholesterol numbers by report have been excellent in April of this year. Number/ear were under good control. Blood pressure has also been typically well-controlled. EKG shows normal sinus rhythm with rate 68 beats per minute, right bundle branch block, no significant ST or T wave changes, left axis deviation.        Outpatient Encounter Prescriptions as of 04/11/2011  Medication Sig Dispense Refill  . alfuzosin (UROXATRAL) 10 MG 24 hr tablet Take 10 mg by mouth daily.        Marland Kitchen ALPRAZolam (XANAX XR) 1 MG 24 hr tablet Take 1 mg by mouth every morning.        Marland Kitchen ALPRAZolam (XANAX) 0.5 MG tablet Take 0.5 mg by mouth at bedtime as needed.        Marland Kitchen aspirin 81 MG EC tablet Take 81 mg by mouth daily.        Marland Kitchen atenolol (TENORMIN) 25 MG tablet Take 0.5 tablets (12.5 mg total) by mouth daily.  90 tablet  3  . atorvastatin (LIPITOR) 40 MG tablet Take 1 tablet (40 mg total) by mouth daily.  90 tablet  3  . buPROPion (WELLBUTRIN) 100 MG tablet Take 100 mg by mouth 2 (two) times daily.        . Calcium Carbonate-Vitamin D (CALTRATE 600+D) 600-400 MG-UNIT per tablet  Take 2 tablets by mouth daily.        Marland Kitchen lisinopril (PRINIVIL,ZESTRIL) 10 MG tablet Take 1 tablet (10 mg total) by mouth daily.  90 tablet  3  . niacin (NIASPAN) 1000 MG CR tablet Take 2,000 mg by mouth daily.        . nitroGLYCERIN (NITROSTAT) 0.4 MG SL tablet Place 1 tablet (0.4 mg total) under the tongue every 5 (five) minutes as needed.  90 tablet  3  . pantoprazole (PROTONIX) 40 MG tablet Take 40 mg by mouth daily.        Marland Kitchen PARoxetine (PAXIL-CR) 25 MG 24 hr tablet Take 25 mg by mouth every morning.        . prasugrel (EFFIENT) 10 MG TABS Take 1 tablet (10 mg total) by mouth daily.  30 tablet  6  . rOPINIRole (REQUIP) 0.25 MG tablet Take 0.25 mg by mouth 3 (three) times daily.          Review of Systems  Constitutional: Negative.   HENT: Negative.   Eyes: Negative.   Respiratory: Negative.   Cardiovascular: Negative.   Gastrointestinal: Negative.   Musculoskeletal: Positive for joint swelling.  Skin: Negative.   Neurological: Negative.   Hematological: Negative.   Psychiatric/Behavioral: The patient  is nervous/anxious.   All other systems reviewed and are negative.    BP 134/87  Pulse 71  Ht 5\' 4"  (1.626 m)  Wt 181 lb (82.101 kg)  BMI 31.07 kg/m2  Physical Exam  Nursing note and vitals reviewed. Constitutional: He is oriented to person, place, and time. He appears well-developed and well-nourished.       Mild obesity  HENT:  Head: Normocephalic.  Nose: Nose normal.  Mouth/Throat: Oropharynx is clear and moist.  Eyes: Conjunctivae are normal. Pupils are equal, round, and reactive to light.  Neck: Normal range of motion. Neck supple. No JVD present.  Cardiovascular: Normal rate, regular rhythm, S1 normal, S2 normal, normal heart sounds and intact distal pulses.  Exam reveals no gallop and no friction rub.   No murmur heard. Pulmonary/Chest: Effort normal and breath sounds normal. No respiratory distress. He has no wheezes. He has no rales. He exhibits no tenderness.    Abdominal: Soft. Bowel sounds are normal. He exhibits no distension. There is no tenderness.  Musculoskeletal: Normal range of motion. He exhibits no edema and no tenderness.  Lymphadenopathy:    He has no cervical adenopathy.  Neurological: He is alert and oriented to person, place, and time. Coordination normal.  Skin: Skin is warm and dry. No rash noted. No erythema.  Psychiatric: He has a normal mood and affect. His behavior is normal. Judgment and thought content normal.           Assessment and Plan

## 2011-04-11 NOTE — Assessment & Plan Note (Signed)
We have encouraged continued exercise, careful diet management in an effort to lose weight. 

## 2011-04-21 ENCOUNTER — Telehealth: Payer: Self-pay | Admitting: Cardiovascular Disease

## 2011-04-21 NOTE — Telephone Encounter (Signed)
The patient's wife called back with concern of what to do regarding Edwin Woodward and his cough/pneumonia symptoms.  Told the wife the patient needs to be seen by one of the local acute care clinics.  Told the wife to also after seen by the acute care clinic call after the 20th and get an appointment with Dr.Tullo if not any better. The wife will relay the message to Edwin Woodward.

## 2011-04-21 NOTE — Telephone Encounter (Signed)
Attempted to contact pt, LMOM TCB.  

## 2011-04-21 NOTE — Telephone Encounter (Signed)
Pt states that he has had a feeling of "someone sitting on his chest" off an on for several weeks.  Also states that he is coughing up green stuff and that he thinks he might have pneumonia.  Wanted to be seen today to get an antibiotic.  I explained to him that we do not see patients pneumonia and that he needs to see his primary care.  His primary care doctor is Tullo and he stated that he could not get an appointment with her until the 20th, as she is starting at the new location then.  I explained to him that her old office should be accommodating to her patients that need to be seen and that they should be able to work him in with someone else.  He would still like a return call.

## 2011-04-29 ENCOUNTER — Ambulatory Visit: Payer: BC Managed Care – PPO | Admitting: Internal Medicine

## 2011-04-29 ENCOUNTER — Telehealth: Payer: Self-pay | Admitting: Internal Medicine

## 2011-04-29 ENCOUNTER — Ambulatory Visit (INDEPENDENT_AMBULATORY_CARE_PROVIDER_SITE_OTHER): Payer: Medicare Other | Admitting: Internal Medicine

## 2011-04-29 ENCOUNTER — Encounter: Payer: Self-pay | Admitting: Internal Medicine

## 2011-04-29 VITALS — BP 139/69 | HR 82 | Temp 98.7°F | Resp 16 | Ht 64.0 in | Wt 181.0 lb

## 2011-04-29 DIAGNOSIS — J189 Pneumonia, unspecified organism: Secondary | ICD-10-CM

## 2011-04-29 DIAGNOSIS — R05 Cough: Secondary | ICD-10-CM

## 2011-04-29 DIAGNOSIS — R059 Cough, unspecified: Secondary | ICD-10-CM

## 2011-04-29 DIAGNOSIS — R058 Other specified cough: Secondary | ICD-10-CM

## 2011-04-29 MED ORDER — HYDROCODONE-ACETAMINOPHEN 7.5-500 MG PO TABS: 1.0000 | ORAL_TABLET | ORAL | Status: DC | PRN

## 2011-04-29 MED ORDER — AZITHROMYCIN 500 MG PO TABS: 500.0000 mg | ORAL_TABLET | Freq: Every day | ORAL | Status: DC

## 2011-04-29 MED ORDER — GUAIFENESIN ER 600 MG PO TB12: 600.0000 mg | ORAL_TABLET | Freq: Two times a day (BID) | ORAL | Status: DC | PRN

## 2011-04-29 MED ORDER — AZITHROMYCIN 500 MG PO TABS: 500.0000 mg | ORAL_TABLET | Freq: Every day | ORAL | Status: AC

## 2011-04-29 NOTE — Telephone Encounter (Signed)
Patient is coming into see Dr. Dan Humphreys today for sinus infection.

## 2011-04-29 NOTE — Progress Notes (Signed)
Subjective:    Patient ID: Edwin Woodward, male    DOB: September 27, 1950, 60 y.o.   MRN: [id number]  Cough This is a new problem. The current episode started 1 to 4 weeks ago. The problem has been gradually worsening. The problem occurs constantly. The cough is productive of purulent sputum. Associated symptoms include chest pain, chills, ear pain, a fever, nasal congestion, postnasal drip, rhinorrhea and shortness of breath. Pertinent negatives include no headaches, myalgias, rash, sore throat, sweats or wheezing. Risk factors for lung disease include smoking/tobacco exposure. He has tried OTC cough suppressant (took 2 of his wife's azithromycin and mucinex) for the symptoms. The treatment provided mild relief.      Review of Systems  Constitutional: Positive for fever, chills and fatigue. Negative for diaphoresis and appetite change.  HENT: Positive for ear pain, congestion, rhinorrhea, neck pain (chronic) and postnasal drip. Negative for sore throat, trouble swallowing and neck stiffness.   Eyes: Negative for visual disturbance.  Respiratory: Positive for cough, chest tightness and shortness of breath. Negative for wheezing.   Cardiovascular: Positive for chest pain.  Gastrointestinal: Negative for nausea, abdominal pain and diarrhea.  Musculoskeletal: Negative for myalgias and arthralgias.  Skin: Negative for color change, pallor, rash and wound.  Neurological: Negative for dizziness, weakness and headaches.  Hematological: Negative for adenopathy.  Psychiatric/Behavioral: The patient is nervous/anxious.    BP 139/69  Pulse 82  Temp(Src) 98.7 F (37.1 C) (Oral)  Resp 16  Ht 5\' 4"  (1.626 m)  Wt 181 lb (82.101 kg)  BMI 31.07 kg/m2  SpO2 97% Outpatient Encounter Prescriptions as of 04/29/2011  Medication Sig Dispense Refill  . alfuzosin (UROXATRAL) 10 MG 24 hr tablet Take 10 mg by mouth daily.        Marland Kitchen ALPRAZolam (XANAX XR) 1 MG 24 hr tablet Take 1 mg by mouth every morning.          Marland Kitchen ALPRAZolam (XANAX) 0.5 MG tablet Take 0.5 mg by mouth at bedtime as needed.        Marland Kitchen aspirin 81 MG EC tablet Take 81 mg by mouth daily.        Marland Kitchen atenolol (TENORMIN) 25 MG tablet Take 0.5 tablets (12.5 mg total) by mouth daily.  90 tablet  3  . atorvastatin (LIPITOR) 40 MG tablet Take 1 tablet (40 mg total) by mouth daily.  90 tablet  3  . buPROPion (WELLBUTRIN) 100 MG tablet Take 100 mg by mouth 2 (two) times daily.        . Calcium Carbonate-Vitamin D (CALTRATE 600+D) 600-400 MG-UNIT per tablet Take 2 tablets by mouth daily.        Marland Kitchen lisinopril (PRINIVIL,ZESTRIL) 10 MG tablet Take 1 tablet (10 mg total) by mouth daily.  90 tablet  3  . niacin (NIASPAN) 1000 MG CR tablet Take 2,000 mg by mouth daily.        . nitroGLYCERIN (NITROSTAT) 0.4 MG SL tablet Place 1 tablet (0.4 mg total) under the tongue every 5 (five) minutes as needed.  90 tablet  3  . pantoprazole (PROTONIX) 40 MG tablet Take 40 mg by mouth daily.        Marland Kitchen PARoxetine (PAXIL-CR) 25 MG 24 hr tablet Take 25 mg by mouth every morning.        . prasugrel (EFFIENT) 10 MG TABS Take 1 tablet (10 mg total) by mouth daily.  30 tablet  6  . rOPINIRole (REQUIP) 0.25 MG tablet Take 0.25 mg by mouth 3 (three)  times daily.             Objective:   Physical Exam  Constitutional: He is oriented to person, place, and time. He appears well-developed and well-nourished. No distress.  HENT:  Head: Normocephalic and atraumatic.  Right Ear: External ear normal.  Left Ear: External ear normal.  Nose: Nose normal.  Mouth/Throat: Oropharynx is clear and moist. No oropharyngeal exudate.  Eyes: Conjunctivae and EOM are normal. Pupils are equal, round, and reactive to light. Right eye exhibits no discharge. Left eye exhibits no discharge. No scleral icterus.  Neck: Normal range of motion. Neck supple. No tracheal deviation present. No thyromegaly present.  Cardiovascular: Normal rate, regular rhythm and normal heart sounds.  Exam reveals no gallop and  no friction rub.   No murmur heard. Pulmonary/Chest: Effort normal. No accessory muscle usage. Not tachypneic. No respiratory distress. He has no wheezes. He has rales in the left lower field. He exhibits no tenderness.  Musculoskeletal: Normal range of motion. He exhibits no edema.  Lymphadenopathy:    He has no cervical adenopathy.  Neurological: He is alert and oriented to person, place, and time. No cranial nerve deficit. Coordination normal.  Skin: Skin is warm and dry. No rash noted. He is not diaphoretic. No erythema. No pallor.  Psychiatric: He has a normal mood and affect. His behavior is normal. Judgment and thought content normal.       Assessment:    Community acquired pneumonia  Pt has h/o cough for approximately 4 weeks, associated with fever/chills and left sided pleuritic chest pain. Crackles noted left lower lobe on exam.  Consistent with pneumonia.  Plan:   Will get CXR.  The diagnosis was discussed along with the usual course and treatment. Agricultural engineer distributed. Started azithromycin antibiotics per medication orders. Will use hydrocodone and mucinex for cough (pt has codeine allergy, but has tolerated hydrocodone several times in the past, per his report.) Follow up with PCP in 1 weeks.  He will call sooner if any problems, worsening cough, fever >101.22F.

## 2011-04-29 NOTE — Telephone Encounter (Signed)
Patient is calling back to get results of chest xray

## 2011-04-29 NOTE — Telephone Encounter (Signed)
Patient called back again requesting to be seen today.

## 2011-04-29 NOTE — Patient Instructions (Signed)
Take azithromycin as directed. Use vicodin and mucinex for cough. Follow up in 1 week.  Pneumonia Pneumonia is an infection of the lungs. It may be caused by a bacteria or virus. Most forms are bacterial. Usually, these infections are caused by breathing infectious particles into the lungs (respiratory tract). SYMPTOMS  The most common problems (symptoms) are:   Cough.  Fever.   Chest pain.  Increased rate of breathing.   Wheezing.  Mucus production.   DIAGNOSIS  Often these infections are diagnosed on exam by your caregiver. Sometimes the diagnosis may require:   Chest X-rays.   Blood analysis.   Cultures. Blood cultures may be done to help find the cause of your pneumonia.  Your caregiver may do tests (blood gasses or pulse oximetry) to see how well your lungs are working. TREATMENT The bacterial pneumonias generally respond well to medicines (antibiotics) that kill germs. Viral infections must run their course. These infections will not respond to antibiotics. A pneumococcal shot (vaccine) is available to prevent a common bacterial pneumonia. This is usually suggested for the elderly and for other groups of higher risk individuals, such as those on chemotherapy or those who have problems with their immune system.  You will have pneumococcal screening or vaccination if you are over 42 years old and are not immunized.   If you are a smoker, it is time to quit. You may receive instructions on how to best stop smoking. Your caregiver can provide medicines and counseling to help you quit.  HOME CARE INSTRUCTIONS  Cough suppressants may be used if you are losing too much rest. However, coughing protects you by clearing your lungs. This is one reason for not using cough suppressants, if able, as they take away this protection.   Your caregiver may have prescribed an antibiotic if she or he feels your cough is caused by a bacterial infection. Take all your medicine until you are  finished.   Your caregiver may also prescribe an expectorant to loosen the mucus to be coughed up.   Only take over-the-counter or prescription medicines for pain, discomfort, or fever as directed by your caregiver.   Smoking is a common cause of bronchitis and can contribute to pneumonia. Stopping this habit is an important self-help step.   If you are a smoker and continue to smoke, your cough may last several weeks after your pneumonia has cleared.   A cold steam vaporizer or humidifier in your room or home may help loosen mucus.   Coughing is often worse at night. Sleeping in a semi-upright position in a recliner or using a couple pillows under your head will help with this.   Get rest as you feel it is needed. Your body will usually let you know when to rest.  SEEK IMMEDIATE MEDICAL CARE IF:  You develop pus-like mucus (sputum) or your illness becomes worse. This is especially true if you are elderly or weakened from any other disease.   You cannot control your cough with suppressants and are losing sleep.   You begin coughing up blood.   You develop pain which is getting worse or is uncontrolled with medicines.   You or your child has an oral temperature above 101.5, not controlled by medicine.   Any of the symptoms which initially brought you in for treatment are getting worse rather than better.   You develop shortness of breath or chest pain.  MAKE SURE YOU:   Understand these instructions.   Will watch  your condition.   Will get help right away if you are not doing well or get worse.  Document Released: 08/25/2005 Document Re-Released: 02/12/2010 [facility] Patient Information 2011 [place], [place].

## 2011-04-30 NOTE — Telephone Encounter (Signed)
Can you let pt know that his CXR showed possible early pneumonia in left lower lung? I left message on his answering machine but he may not have received it.

## 2011-05-01 ENCOUNTER — Telehealth: Payer: Self-pay | Admitting: Internal Medicine

## 2011-05-02 NOTE — Telephone Encounter (Signed)
Is the pain not alleviated with vicodin? Is he taking 2 tablets at one time? If not, he can take 2 tablets.  If he is already taking 2 tablets, we can start Flexeril 5mg  po tid prn, disp 30 no refill

## 2011-05-02 NOTE — Telephone Encounter (Signed)
Informed pt. That his CXR showed possible early pneumonia in lower left lung.

## 2011-05-02 NOTE — Telephone Encounter (Signed)
Addended by: Virgina Evener on: 05/02/2011 03:03 PM   Modules accepted: Orders

## 2011-05-02 NOTE — Telephone Encounter (Signed)
Patient wants a refill on his Hydrocodone-APAP since we informed him he can take 2 at one time.  It says that to take Q6hr do you still want him to take 2 tablets every 6 hours.  Please advise.

## 2011-05-02 NOTE — Telephone Encounter (Signed)
Vicodin 2 tablets as needed every 6 hours. No more than 6 tablets per day, absolute maximum. If he is needing this much pain medication,then should be seen back.

## 2011-05-02 NOTE — Telephone Encounter (Signed)
Informed patient that he can take 2 tablets at one time. He was taking 1.5 at a time and informed him if that didn't help to call me back and I would call him in Flexeril.

## 2011-05-05 ENCOUNTER — Telehealth: Payer: Self-pay | Admitting: Internal Medicine

## 2011-05-05 MED ORDER — CYCLOBENZAPRINE HCL 5 MG PO TABS: 5.0000 mg | ORAL_TABLET | Freq: Three times a day (TID) | ORAL | Status: DC

## 2011-05-05 NOTE — Telephone Encounter (Signed)
Not feeling any better  Send to [facility] in [place]

## 2011-05-05 NOTE — Telephone Encounter (Signed)
Patient says that he tried taking 2 of the hydrocodone at one time and he is still feeling terrible. Asked that we call in the flexeril. Flexeril sent to pharmacy.

## 2011-05-08 ENCOUNTER — Other Ambulatory Visit: Payer: Self-pay | Admitting: *Deleted

## 2011-05-08 DIAGNOSIS — J069 Acute upper respiratory infection, unspecified: Secondary | ICD-10-CM

## 2011-05-08 MED ORDER — AZITHROMYCIN 250 MG PO TABS: 250.0000 mg | ORAL_TABLET | Freq: Two times a day (BID) | ORAL | Status: DC

## 2011-05-08 NOTE — Telephone Encounter (Signed)
Patient says that he is still not feeling any better and is still coughing up green stuff. He is asking if he could get another round of antibiotics called in. Also he says that the flexeril is not helping, he would like to go back to the hydrocodone, but will need rx called in.

## 2011-05-08 NOTE — Telephone Encounter (Signed)
We can call in 7 more days of azithromycin. If no improvement after this, he needs to be seen back.

## 2011-05-08 NOTE — Telephone Encounter (Signed)
Fine to fill the azithromycin. But, we cannot refill the hydrocodone.

## 2011-05-08 NOTE — Telephone Encounter (Signed)
Needs to be seen if he has used all hydrocodone and pain persistent

## 2011-05-13 ENCOUNTER — Encounter: Payer: Self-pay | Admitting: Internal Medicine

## 2011-05-13 ENCOUNTER — Ambulatory Visit (INDEPENDENT_AMBULATORY_CARE_PROVIDER_SITE_OTHER): Payer: Medicare Other | Admitting: Internal Medicine

## 2011-05-13 DIAGNOSIS — F411 Generalized anxiety disorder: Secondary | ICD-10-CM

## 2011-05-13 DIAGNOSIS — J189 Pneumonia, unspecified organism: Secondary | ICD-10-CM

## 2011-05-13 DIAGNOSIS — F419 Anxiety disorder, unspecified: Secondary | ICD-10-CM

## 2011-05-13 MED ORDER — AZITHROMYCIN 500 MG PO TABS: ORAL_TABLET | ORAL | Status: DC

## 2011-05-13 NOTE — Patient Instructions (Signed)
increase the paxil to 25 mg starting tomorrow.  Take the wellbutrin tonight but omit the morning dose for the next week,  And after one week ,  Stop the evening dose.    Take a second 250 mg dose of azithromycin today,  Then start the 500 mg daily dose tomorrow.   We will repeat your chest x ray in 4 weeks (early October)

## 2011-05-14 DIAGNOSIS — F411 Generalized anxiety disorder: Secondary | ICD-10-CM | POA: Insufficient documentation

## 2011-05-14 NOTE — Progress Notes (Signed)
Subjective:    Patient ID: Edwin Woodward, male    DOB: 1950/12/20, 60 y.o.   MRN: [id number]  HPI  60 yo male recently treated for pneumonia with short dose of azithromycin and cough suppressant returns for continued malaise and purulent sputum.  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   Past Medical History  Diagnosis Date  . CAD (coronary artery disease)   . Presence of bare metal stent in right coronary artery     proximal RCA adn LAD in April 2009  . Hyperlipidemia   . Hypertension   . Cardiovascular stress test abnormal     Jan 2010 no significant ischemia    Current Outpatient Prescriptions on File Prior to Visit  Medication Sig Dispense Refill  . alfuzosin (UROXATRAL) 10 MG 24 hr tablet Take 10 mg by mouth daily.        Marland Kitchen ALPRAZolam (XANAX XR) 1 MG 24 hr tablet Take 1 mg by mouth every morning.        Marland Kitchen ALPRAZolam (XANAX) 0.5 MG tablet Take 0.5 mg by mouth at bedtime as needed.        Marland Kitchen aspirin 81 MG EC tablet Take 81 mg by mouth daily.        Marland Kitchen atenolol (TENORMIN) 25 MG tablet Take 0.5 tablets (12.5 mg total) by mouth daily.  90 tablet  3  . atorvastatin (LIPITOR) 40 MG tablet Take 1 tablet (40 mg total) by mouth daily.  90 tablet  3  . azithromycin (ZITHROMAX Z-PAK) 250 MG tablet Take 1 tablet (250 mg total) by mouth 2 (two) times daily.  14 each  0  . buPROPion (WELLBUTRIN) 100 MG tablet Take 100 mg by mouth 2 (two) times daily.        . Calcium Carbonate-Vitamin D (CALTRATE 600+D) 600-400 MG-UNIT per tablet Take 2 tablets by mouth daily.        Marland Kitchen guaiFENesin (MUCINEX) 600 MG 12 hr tablet Take 1 tablet (600 mg total) by mouth 2 (two) times daily as needed for congestion.  60 tablet  2  . lisinopril (PRINIVIL,ZESTRIL) 10 MG tablet Take 1 tablet (10 mg total) by mouth daily.  90 tablet  3  . niacin (NIASPAN) 1000 MG CR tablet Take 2,000 mg by mouth daily.        .  nitroGLYCERIN (NITROSTAT) 0.4 MG SL tablet Place 1 tablet (0.4 mg total) under the tongue every 5 (five) minutes as needed.  90 tablet  3  . pantoprazole (PROTONIX) 40 MG tablet Take 40 mg by mouth daily.        Marland Kitchen PARoxetine (PAXIL-CR) 25 MG 24 hr tablet Take 25 mg by mouth every morning.        . prasugrel (EFFIENT) 10 MG TABS Take 1 tablet (10 mg total) by mouth daily.  30 tablet  6  . rOPINIRole (REQUIP) 0.25 MG tablet Take 0.25 mg by mouth 3 (three) times daily.               Review of Systems  Constitutional: Positive for fever, chills and fatigue. Negative for diaphoresis, activity change, appetite change and unexpected weight change.  HENT: Positive for ear pain, congestion, rhinorrhea, neck pain (chronic) and postnasal drip. Negative for hearing loss, nosebleeds, sore throat, facial swelling, sneezing, drooling, mouth sores, trouble swallowing, neck stiffness, dental problem, voice change, sinus pressure, tinnitus and ear  discharge.   Eyes: Negative for photophobia, pain, discharge, redness, itching and visual disturbance.  Respiratory: Positive for cough, chest tightness and shortness of breath. Negative for apnea, choking, wheezing and stridor.   Cardiovascular: Positive for chest pain. Negative for palpitations and leg swelling.  Gastrointestinal: Negative for nausea, vomiting, abdominal pain, diarrhea, constipation, blood in stool, abdominal distention, anal bleeding and rectal pain.  Genitourinary: Negative for dysuria, urgency, frequency, hematuria, flank pain, decreased urine volume, scrotal swelling, difficulty urinating and testicular pain.  Musculoskeletal: Negative for myalgias, back pain, joint swelling, arthralgias and gait problem.  Skin: Negative for color change, pallor, rash and wound.  Neurological: Negative for dizziness, tremors, seizures, syncope, speech difficulty, weakness, light-headedness, numbness and headaches.  Hematological: Negative for adenopathy.    Psychiatric/Behavioral: Negative for suicidal ideas, hallucinations, behavioral problems, confusion, sleep disturbance, dysphoric mood, decreased concentration and agitation. The patient is nervous/anxious.    BP 152/84  Pulse 87  Temp(Src) 98.3 F (36.8 C) (Oral)  Resp 14  Wt 178 lb 4 oz (80.854 kg)  SpO2 96%      Objective:   Physical Exam  Constitutional: He is oriented to person, place, and time. He appears well-developed and well-nourished. No distress.  HENT:  Head: Normocephalic and atraumatic.  Right Ear: External ear normal.  Left Ear: External ear normal.  Nose: Nose normal.  Mouth/Throat: Oropharynx is clear and moist. No oropharyngeal exudate.  Eyes: Conjunctivae and EOM are normal. Pupils are equal, round, and reactive to light. Right eye exhibits no discharge. Left eye exhibits no discharge. No scleral icterus.  Neck: Normal range of motion. Neck supple. No tracheal deviation present. No thyromegaly present.  Cardiovascular: Normal rate, regular rhythm and normal heart sounds.  Exam reveals no gallop and no friction rub.   No murmur heard. Pulmonary/Chest: Effort normal. No accessory muscle usage. Not tachypneic. No respiratory distress. He has no wheezes. He has rales in the left lower field. He exhibits no tenderness.  Musculoskeletal: Normal range of motion. He exhibits no edema.  Lymphadenopathy:    He has no cervical adenopathy.  Neurological: He is alert and oriented to person, place, and time. No cranial nerve deficit. Coordination normal.  Skin: Skin is warm and dry. No rash noted. He is not diaphoretic. No erythema. No pallor.  Psychiatric: He has a normal mood and affect. His behavior is normal. Judgment and thought content normal.          Assessment & Plan:  Community acquired pneumonia:  Suggested by exam, confiemd by last weeks CXR.  He has responded to short course of axithromycin but continues to have purulent sputum.  Since he is not having  fevers and has multiple drug allergies,  Will continue azithromycin at 500 mg aily for 5 more days.  Avoiding f/q due to risk of tendon rupture given his current history of tendonitis.   CTS:  Pt is scheduled to see Dr. 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

## 2011-05-14 NOTE — Assessment & Plan Note (Signed)
We are in the process of weanign him from wellbutrin and changing to Paxil for better relief of symptoms.  Cross Tapering instructions provided.

## 2011-05-23 ENCOUNTER — Encounter: Payer: Self-pay | Admitting: Internal Medicine

## 2011-06-12 ENCOUNTER — Telehealth: Payer: Self-pay | Admitting: Internal Medicine

## 2011-06-12 NOTE — Telephone Encounter (Signed)
Patients wife called and stated you had been trying to taper patient off of Wellbutrin, but he has not been doing well on this low dose.  She told him today that he was taking his last pill today and he got upset stating he just could not get off this medication completley.  She wanted to know if he could go back on the 150 mg dose in the afternoon.  Please advise.

## 2011-06-13 MED ORDER — BUPROPION HCL ER (SR) 150 MG PO TB12: 150.0000 mg | ORAL_TABLET | Freq: Every day | ORAL | Status: DC

## 2011-06-13 NOTE — Telephone Encounter (Signed)
Notified patient he can try the wellbutrin in the afternoon, Rx has been called in.

## 2011-06-13 NOTE — Telephone Encounter (Signed)
Yes, he can try taking 150 mg once daily in the afternoon.  Call him in #30 with 3 refills of wellbutrin sr

## 2011-06-23 ENCOUNTER — Telehealth: Payer: Self-pay | Admitting: Internal Medicine

## 2011-06-23 DIAGNOSIS — J189 Pneumonia, unspecified organism: Secondary | ICD-10-CM

## 2011-06-23 NOTE — Telephone Encounter (Signed)
I believe Edwin Woodward was supposed to handle this as a 6 wk followup on previosu cxr  But nothing is documented.  Did the patient have the x ray done?  If Not he needs to. I will place order in EPIc.  Please forward to Edwin Woodward so we can figure this out when she returns.

## 2011-06-23 NOTE — Telephone Encounter (Signed)
Radiology request form was faxed to [facility] patient will go over on Wednesday or Thursday of this week .

## 2011-06-23 NOTE — Telephone Encounter (Signed)
Went to have follow up CXR Friday Oct. 12,2012 no order was in the system. Patient coughing up green mucous .

## 2011-06-27 ENCOUNTER — Ambulatory Visit: Payer: BC Managed Care – PPO | Admitting: Internal Medicine

## 2011-06-27 NOTE — Telephone Encounter (Signed)
Pt called to check status of cxr results.   Pt advised that results have not been received and if resulted before 5 pm, he will receive a call

## 2011-06-28 NOTE — Telephone Encounter (Signed)
I have not received the results but will check on Monday

## 2011-06-30 ENCOUNTER — Telehealth: Payer: Self-pay | Admitting: Internal Medicine

## 2011-06-30 NOTE — Telephone Encounter (Signed)
See below Wants to know results  From chest xray he had done Friday [facility].   [phone] home   [phone] cell Please call cell will be out all day  She called dr Mariah Milling office they explained why he was seening dr Graciela Husbands instead of dr Mariah Milling

## 2011-06-30 NOTE — Telephone Encounter (Signed)
Sent message to [place] earlier re chest x ray all abnormalities have completely resolved

## 2011-06-30 NOTE — Telephone Encounter (Signed)
Patient's  wife wants to speak to Dr. Darrick Huntsman about her husbands dx's and why he was referred to a cardiologist that was not Dr. Mariah Milling .

## 2011-06-30 NOTE — Telephone Encounter (Signed)
Left voicemail asking patient to call the office to make an appt.

## 2011-06-30 NOTE — Telephone Encounter (Signed)
I can't do anything else by phone,.  Please have him make appt so I can reevaluate him.

## 2011-06-30 NOTE — Telephone Encounter (Signed)
Notified patient of results.  He stated he is still spitting up green mucus and wanted to know what else should he do for this.  Please advise.

## 2011-07-08 ENCOUNTER — Institutional Professional Consult (permissible substitution): Payer: Medicare Other | Admitting: Internal Medicine

## 2011-07-16 ENCOUNTER — Encounter: Payer: Self-pay | Admitting: Internal Medicine

## 2011-07-17 ENCOUNTER — Other Ambulatory Visit: Payer: Self-pay | Admitting: Internal Medicine

## 2011-07-17 NOTE — Telephone Encounter (Signed)
[phone] zantax    [facility] [place]  Pt wants you to call him when you call in his rx  [facility] [place] [facility] not [facility] it

## 2011-07-18 MED ORDER — ALPRAZOLAM 0.5 MG PO TABS: 0.5000 mg | ORAL_TABLET | Freq: Every evening | ORAL | Status: DC | PRN

## 2011-07-18 NOTE — Telephone Encounter (Signed)
Correction: the qty on the alprazolam refill is #30 no 330

## 2011-07-18 NOTE — Telephone Encounter (Signed)
Ok to caLL IN REFILL ON ALPRAZOLAM 0.5 MG  330 with 4 refills

## 2011-07-29 ENCOUNTER — Telehealth: Payer: Self-pay | Admitting: Internal Medicine

## 2011-07-29 MED ORDER — ALPRAZOLAM ER 1 MG PO TB24: 1.0000 mg | ORAL_TABLET | ORAL | Status: DC

## 2011-07-29 NOTE — Telephone Encounter (Signed)
Rx for xanax was called in on 07-18-11. I spoke with the pharmacy and they do have that rx and per the pharmacy they did not tell the patient that we had denied it. I tried calling patient to tell them that the rx should be ready for him, but got no answer. Left message asking patient to call me.

## 2011-07-29 NOTE — Telephone Encounter (Signed)
Yes, ok to call in the 1 mg alprazolam XR one tabllt daily  #30 with 5 refills

## 2011-07-29 NOTE — Telephone Encounter (Signed)
[phone] Pt wife called Edwin Woodward has only 2 pills left of xanax xr 1 mg  [facility] [place] Please advise pt if there is a problem with this rx.  He only has 2 pills left   Please advise when this is called in pt wife is making him appointment with dr Darrick Huntsman

## 2011-07-29 NOTE — Telephone Encounter (Signed)
Spoke with patient's wife, she says that they did get the rx for the 0.5 mg alprazolam, they are requesting the 1mg  xr. We never received the request from the pharmacy for this. Is it okay to call in?

## 2011-07-30 NOTE — Telephone Encounter (Signed)
Rx phoned to pharmacy, patient notified.

## 2011-08-13 ENCOUNTER — Encounter: Payer: Self-pay | Admitting: Internal Medicine

## 2011-08-13 ENCOUNTER — Ambulatory Visit (INDEPENDENT_AMBULATORY_CARE_PROVIDER_SITE_OTHER): Payer: Medicare Other | Admitting: Internal Medicine

## 2011-08-13 DIAGNOSIS — D569 Thalassemia, unspecified: Secondary | ICD-10-CM

## 2011-08-13 DIAGNOSIS — D510 Vitamin B12 deficiency anemia due to intrinsic factor deficiency: Secondary | ICD-10-CM

## 2011-08-13 DIAGNOSIS — Z23 Encounter for immunization: Secondary | ICD-10-CM

## 2011-08-13 DIAGNOSIS — M65839 Other synovitis and tenosynovitis, unspecified forearm: Secondary | ICD-10-CM

## 2011-08-13 DIAGNOSIS — E785 Hyperlipidemia, unspecified: Secondary | ICD-10-CM

## 2011-08-13 DIAGNOSIS — I10 Essential (primary) hypertension: Secondary | ICD-10-CM

## 2011-08-13 DIAGNOSIS — D649 Anemia, unspecified: Secondary | ICD-10-CM

## 2011-08-13 DIAGNOSIS — I2510 Atherosclerotic heart disease of native coronary artery without angina pectoris: Secondary | ICD-10-CM

## 2011-08-13 DIAGNOSIS — M65849 Other synovitis and tenosynovitis, unspecified hand: Secondary | ICD-10-CM

## 2011-08-13 DIAGNOSIS — M778 Other enthesopathies, not elsewhere classified: Secondary | ICD-10-CM

## 2011-08-13 NOTE — Patient Instructions (Signed)
Requip taper:  2 pills per night for one week, then 1 pill per night for one week, then stop  (requires #21)  DeXA scan to be scheduled at [facility] in January   Vitamin D level tpday  Ask pharmacist about taking caltrate with effient   You are getting  a flu shot today

## 2011-08-14 LAB — CBC WITH DIFFERENTIAL/PLATELET
Basophils Absolute: 0 10*3/uL (ref 0.0–0.1)
Eosinophils Relative: 1.3 % (ref 0.0–5.0)
HCT: 39.7 % (ref 39.0–52.0)
Lymphocytes Relative: 30.3 % (ref 12.0–46.0)
Lymphs Abs: 2.6 10*3/uL (ref 0.7–4.0)
Monocytes Relative: 11.1 % (ref 3.0–12.0)
Platelets: 209 10*3/uL (ref 150.0–400.0)
RDW: 16.5 % — ABNORMAL HIGH (ref 11.5–14.6)
WBC: 8.4 10*3/uL (ref 4.5–10.5)

## 2011-08-14 LAB — FOLATE RBC: RBC Folate: 1750 ng/mL (ref >=366)

## 2011-08-14 LAB — VITAMIN B12: Vitamin B-12: 520 pg/mL (ref 211–911)

## 2011-08-14 LAB — VITAMIN D 25 HYDROXY (VIT D DEFICIENCY, FRACTURES): Vit D, 25-Hydroxy: 43 ng/mL (ref 30–89)

## 2011-08-16 ENCOUNTER — Encounter: Payer: Self-pay | Admitting: Internal Medicine

## 2011-08-16 DIAGNOSIS — D569 Thalassemia, unspecified: Secondary | ICD-10-CM | POA: Insufficient documentation

## 2011-08-16 DIAGNOSIS — M778 Other enthesopathies, not elsewhere classified: Secondary | ICD-10-CM | POA: Insufficient documentation

## 2011-08-16 NOTE — Assessment & Plan Note (Signed)
With prior CABG and PTCA/stent.  Continue asa, ACE Inhibitor, statin and beta blocker .  He has had no anginal symptoms in over 6 months.

## 2011-08-16 NOTE — Progress Notes (Signed)
Subjective:    Patient ID: Edwin Woodward, male    DOB: Apr 02, 1951, 60 y.o.   MRN: [id number]  HPIMr. Behrens returns for followup of chronic medical issues including anxiety, thalassemia, and recurrent right hand pain.  He has been evaluated by Dr. Amanda Pea who has initiated conservative therapy with a brace and total rest of the hand ( requiring suspension of all drumming activities) for 6 weeks priro to consideration of need for surgery,  Mr. Acy has had prior surgeries, including carpal tunnel release.   Past Medical History  Diagnosis Date  . CAD (coronary artery disease)   . Presence of bare metal stent in right coronary artery     proximal RCA adn LAD in April 2009  . Hyperlipidemia   . Hypertension   . Cardiovascular stress test abnormal     Jan 2010 no significant ischemia   Past Surgical History  Procedure Date  . Replacement total knee   . Coronary artery bypass graft     2 vessel  . Joint replacement     total knee  . Appendectomy   . Carpal tunnel release   . Vasectomy     Current Outpatient Prescriptions on File Prior to Visit  Medication Sig Dispense Refill  . alfuzosin (UROXATRAL) 10 MG 24 hr tablet Take 10 mg by mouth daily.        Marland Kitchen ALPRAZolam (XANAX XR) 1 MG 24 hr tablet Take 1 tablet (1 mg total) by mouth every morning.  30 tablet  5  . ALPRAZolam (XANAX) 0.5 MG tablet Take 1 tablet (0.5 mg total) by mouth at bedtime as needed.  30 tablet  3  . aspirin 81 MG EC tablet Take 81 mg by mouth daily.        Marland Kitchen atenolol (TENORMIN) 25 MG tablet Take 0.5 tablets (12.5 mg total) by mouth daily.  90 tablet  3  . atorvastatin (LIPITOR) 40 MG tablet Take 1 tablet (40 mg total) by mouth daily.  90 tablet  3  . buPROPion (WELLBUTRIN SR) 150 MG 12 hr tablet Take 1 tablet (150 mg total) by mouth daily at 2 PM daily at 2 PM.  30 tablet  3  . Calcium Carbonate-Vitamin D (CALTRATE 600+D) 600-400 MG-UNIT per tablet Take 2 tablets by mouth daily.        Marland Kitchen lisinopril  (PRINIVIL,ZESTRIL) 10 MG tablet Take 1 tablet (10 mg total) by mouth daily.  90 tablet  3  . niacin (NIASPAN) 1000 MG CR tablet Take 2,000 mg by mouth daily.        . nitroGLYCERIN (NITROSTAT) 0.4 MG SL tablet Place 1 tablet (0.4 mg total) under the tongue every 5 (five) minutes as needed.  90 tablet  3  . pantoprazole (PROTONIX) 40 MG tablet Take 40 mg by mouth daily.        Marland Kitchen PARoxetine (PAXIL-CR) 25 MG 24 hr tablet Take 25 mg by mouth every morning.        . prasugrel (EFFIENT) 10 MG TABS Take 1 tablet (10 mg total) by mouth daily.  30 tablet  6  . rOPINIRole (REQUIP) 0.25 MG tablet Take 0.25 mg by mouth 3 (three) times daily.           Review of Systems  Constitutional: Negative for fever, chills, diaphoresis, activity change, appetite change, fatigue and unexpected weight change.  HENT: Negative for hearing loss, ear pain, nosebleeds, congestion, sore throat, facial swelling, rhinorrhea, sneezing, drooling, mouth sores, trouble swallowing, neck pain,  neck stiffness, dental problem, voice change, postnasal drip, sinus pressure, tinnitus and ear discharge.   Eyes: Negative for photophobia, pain, discharge, redness, itching and visual disturbance.  Respiratory: Negative for apnea, cough, choking, chest tightness, shortness of breath, wheezing and stridor.   Cardiovascular: Negative for chest pain, palpitations and leg swelling.  Gastrointestinal: Negative for nausea, vomiting, abdominal pain, diarrhea, constipation, blood in stool, abdominal distention, anal bleeding and rectal pain.  Genitourinary: Negative for dysuria, urgency, frequency, hematuria, flank pain, decreased urine volume, scrotal swelling, difficulty urinating and testicular pain.  Musculoskeletal: Negative for myalgias, back pain, joint swelling, arthralgias and gait problem.  Skin: Negative for color change, rash and wound.  Neurological: Negative for dizziness, tremors, seizures, syncope, speech difficulty, weakness,  light-headedness, numbness and headaches.  Psychiatric/Behavioral: Positive for sleep disturbance and decreased concentration. Negative for suicidal ideas, hallucinations, behavioral problems, confusion and dysphoric mood. The patient is nervous/anxious.        Objective:   Physical Exam  Constitutional: He is oriented to person, place, and time.  HENT:  Head: Normocephalic and atraumatic.  Mouth/Throat: Oropharynx is clear and moist.  Eyes: Conjunctivae and EOM are normal.  Neck: Normal range of motion. Neck supple. No JVD present. No thyromegaly present.  Cardiovascular: Normal rate, regular rhythm and normal heart sounds.   Pulmonary/Chest: Effort normal and breath sounds normal. He has no wheezes. He has no rales.  Abdominal: Soft. Bowel sounds are normal. He exhibits no mass. There is no tenderness. There is no rebound.  Musculoskeletal: Normal range of motion. He exhibits no edema.  Neurological: He is alert and oriented to person, place, and time.  Skin: Skin is warm and dry.  Psychiatric: He has a normal mood and affect.          Assessment & Plan:   HYPERTENSION, BENIGN Well controlled on current regimen. No changes today.  HYPERLIPIDEMIA-MIXED Managed with lipitor 40 mg daily ,LDL currently 80,  Total 136.  No changes today.   CAD, NATIVE VESSEL With prior CABG and PTCA/stent.  Continue asa, ACE Inhibitor, statin and beta blocker .  He has had no anginal symptoms in over 6 months.   Thalassemia He hgb is stable at 12 by recheck today. B12 and folate levels are norma.  Tendonitis of wrist, right continue conservative therapy and rest.  Advised again recurrent massaging of hand which he appears to have been doing excessively to aid healing.  Follow up with Gramig as directed.     Updated Medication List Outpatient Encounter Prescriptions as of 08/13/2011  Medication Sig Dispense Refill  . alfuzosin (UROXATRAL) 10 MG 24 hr tablet Take 10 mg by mouth daily.          Marland Kitchen ALPRAZolam (XANAX XR) 1 MG 24 hr tablet Take 1 tablet (1 mg total) by mouth every morning.  30 tablet  5  . ALPRAZolam (XANAX) 0.5 MG tablet Take 1 tablet (0.5 mg total) by mouth at bedtime as needed.  30 tablet  3  . aspirin 81 MG EC tablet Take 81 mg by mouth daily.        Marland Kitchen atenolol (TENORMIN) 25 MG tablet Take 0.5 tablets (12.5 mg total) by mouth daily.  90 tablet  3  . atorvastatin (LIPITOR) 40 MG tablet Take 1 tablet (40 mg total) by mouth daily.  90 tablet  3  . buPROPion (WELLBUTRIN SR) 150 MG 12 hr tablet Take 1 tablet (150 mg total) by mouth daily at 2 PM daily at 2 PM.  30 tablet  3  . Calcium Carbonate-Vitamin D (CALTRATE 600+D) 600-400 MG-UNIT per tablet Take 2 tablets by mouth daily.        Marland Kitchen lisinopril (PRINIVIL,ZESTRIL) 10 MG tablet Take 1 tablet (10 mg total) by mouth daily.  90 tablet  3  . niacin (NIASPAN) 1000 MG CR tablet Take 2,000 mg by mouth daily.        . nitroGLYCERIN (NITROSTAT) 0.4 MG SL tablet Place 1 tablet (0.4 mg total) under the tongue every 5 (five) minutes as needed.  90 tablet  3  . pantoprazole (PROTONIX) 40 MG tablet Take 40 mg by mouth daily.        Marland Kitchen PARoxetine (PAXIL-CR) 25 MG 24 hr tablet Take 25 mg by mouth every morning.        . prasugrel (EFFIENT) 10 MG TABS Take 1 tablet (10 mg total) by mouth daily.  30 tablet  6  . rOPINIRole (REQUIP) 0.25 MG tablet Take 0.25 mg by mouth 3 (three) times daily.        Marland Kitchen DISCONTD: azithromycin (ZITHROMAX Z-PAK) 250 MG tablet Take 1 tablet (250 mg total) by mouth 2 (two) times daily.  14 each  0  . DISCONTD: azithromycin (ZITHROMAX) 500 MG tablet Take 1 tablet daily for 5 days  5 tablet  0  . DISCONTD: guaiFENesin (MUCINEX) 600 MG 12 hr tablet Take 1 tablet (600 mg total) by mouth 2 (two) times daily as needed for congestion.  60 tablet  2

## 2011-08-16 NOTE — Assessment & Plan Note (Addendum)
Well-controlled on current regimen. No changes today. 

## 2011-08-16 NOTE — Assessment & Plan Note (Signed)
continue conservative therapy and rest.  Advised again recurrent massaging of hand which he appears to have been doing excessively to aid healing.  Follow up with Gramig as directed.

## 2011-08-16 NOTE — Assessment & Plan Note (Addendum)
He hgb is stable at 12 by recheck today. B12 and folate levels are norma.

## 2011-08-16 NOTE — Assessment & Plan Note (Addendum)
Managed with lipitor 40 mg daily ,LDL currently 80,  Total 136.  No changes today.

## 2011-08-19 ENCOUNTER — Encounter: Payer: Self-pay | Admitting: Internal Medicine

## 2011-08-26 ENCOUNTER — Telehealth: Payer: Self-pay

## 2011-08-26 MED ORDER — NIACIN ER (ANTIHYPERLIPIDEMIC) 1000 MG PO TBCR: 2000.0000 mg | EXTENDED_RELEASE_TABLET | Freq: Every day | ORAL | Status: DC

## 2011-08-26 NOTE — Telephone Encounter (Signed)
Refill sent for niaspan

## 2011-09-09 HISTORY — PX: CATARACT EXTRACTION: SUR2

## 2011-09-15 DIAGNOSIS — F331 Major depressive disorder, recurrent, moderate: Secondary | ICD-10-CM | POA: Diagnosis not present

## 2011-09-22 ENCOUNTER — Telehealth: Payer: Self-pay | Admitting: Internal Medicine

## 2011-09-22 DIAGNOSIS — F331 Major depressive disorder, recurrent, moderate: Secondary | ICD-10-CM | POA: Diagnosis not present

## 2011-09-22 NOTE — Telephone Encounter (Signed)
Pt wife called pt has sore throat,coughing fever very tired

## 2011-09-22 NOTE — Telephone Encounter (Signed)
Patient has an appt Friday  

## 2011-09-23 ENCOUNTER — Ambulatory Visit: Payer: Medicare Other | Admitting: Internal Medicine

## 2011-09-26 ENCOUNTER — Encounter: Payer: Self-pay | Admitting: Internal Medicine

## 2011-09-26 ENCOUNTER — Ambulatory Visit (INDEPENDENT_AMBULATORY_CARE_PROVIDER_SITE_OTHER): Payer: Medicare Other | Admitting: Internal Medicine

## 2011-09-26 VITALS — BP 122/60 | HR 74 | Temp 97.6°F | Wt 187.0 lb

## 2011-09-26 DIAGNOSIS — J310 Chronic rhinitis: Secondary | ICD-10-CM

## 2011-09-26 DIAGNOSIS — H6690 Otitis media, unspecified, unspecified ear: Secondary | ICD-10-CM

## 2011-09-26 MED ORDER — LEVOFLOXACIN 500 MG PO TABS: 500.0000 mg | ORAL_TABLET | Freq: Every day | ORAL | Status: AC

## 2011-09-26 MED ORDER — MOMETASONE FUROATE 50 MCG/ACT NA SUSP: 2.0000 | Freq: Every day | NASAL | Status: DC

## 2011-09-26 NOTE — Progress Notes (Signed)
Subjective:    Patient ID: Edwin [facility], male    DOB: 1950/11/17, 61 y.o.   MRN: [id number]  HPI  Mr. villella is a 61 yr old white male with a history of generalized anxiety disorder who presents with a one week history of ear fullness, right greater than left, and cough productive of green sputum.Marland Kitchen  He reports subjective fevers , frontal and maxillary facial pain, and his eyes have been watering .  He has been using OTC decongestants and mucinex without any improvement. Past Medical History  Diagnosis Date  . CAD (coronary artery disease)   . Presence of bare metal stent in right coronary artery     proximal RCA adn LAD in April 2009  . Hyperlipidemia   . Hypertension   . Cardiovascular stress test abnormal     Jan 2010 no significant ischemia   Current Outpatient Prescriptions on File Prior to Visit  Medication Sig Dispense Refill  . alfuzosin (UROXATRAL) 10 MG 24 hr tablet Take 10 mg by mouth daily.        Marland Kitchen ALPRAZolam (XANAX XR) 1 MG 24 hr tablet Take 1 tablet (1 mg total) by mouth every morning.  30 tablet  5  . ALPRAZolam (XANAX) 0.5 MG tablet Take 1 tablet (0.5 mg total) by mouth at bedtime as needed.  30 tablet  3  . aspirin 81 MG EC tablet Take 81 mg by mouth daily.        Marland Kitchen atenolol (TENORMIN) 25 MG tablet Take 0.5 tablets (12.5 mg total) by mouth daily.  90 tablet  3  . atorvastatin (LIPITOR) 40 MG tablet Take 1 tablet (40 mg total) by mouth daily.  90 tablet  3  . buPROPion (WELLBUTRIN SR) 150 MG 12 hr tablet Take 1 tablet (150 mg total) by mouth daily at 2 PM daily at 2 PM.  30 tablet  3  . Calcium Carbonate-Vitamin D (CALTRATE 600+D) 600-400 MG-UNIT per tablet Take 2 tablets by mouth daily.        Marland Kitchen lisinopril (PRINIVIL,ZESTRIL) 10 MG tablet Take 1 tablet (10 mg total) by mouth daily.  90 tablet  3  . niacin (NIASPAN) 1000 MG CR tablet Take 2 tablets (2,000 mg total) by mouth daily.  60 tablet  11  . nitroGLYCERIN (NITROSTAT) 0.4 MG SL tablet Place 1 tablet (0.4 mg  total) under the tongue every 5 (five) minutes as needed.  90 tablet  3  . pantoprazole (PROTONIX) 40 MG tablet Take 40 mg by mouth daily.        Marland Kitchen PARoxetine (PAXIL-CR) 25 MG 24 hr tablet Take 25 mg by mouth every morning.        . prasugrel (EFFIENT) 10 MG TABS Take 1 tablet (10 mg total) by mouth daily.  30 tablet  6  . rOPINIRole (REQUIP) 0.25 MG tablet Take 0.25 mg by mouth 3 (three) times daily.          Review of Systems  Constitutional: Negative for fever, chills, diaphoresis, activity change, appetite change, fatigue and unexpected weight change.  HENT: Positive for ear pain, congestion, rhinorrhea, postnasal drip and sinus pressure. Negative for hearing loss, nosebleeds, sore throat, facial swelling, sneezing, drooling, mouth sores, trouble swallowing, neck pain, neck stiffness, dental problem, voice change, tinnitus and ear discharge.   Eyes: Negative for photophobia, pain, discharge, redness, itching and visual disturbance.  Respiratory: Positive for cough. Negative for apnea, choking, chest tightness, shortness of breath, wheezing and stridor.   Cardiovascular: Negative  for chest pain, palpitations and leg swelling.  Gastrointestinal: Negative for nausea, vomiting, abdominal pain, diarrhea, constipation, blood in stool, abdominal distention, anal bleeding and rectal pain.  Genitourinary: Negative for dysuria, urgency, frequency, hematuria, flank pain, decreased urine volume, scrotal swelling, difficulty urinating and testicular pain.  Musculoskeletal: Negative for myalgias, back pain, joint swelling, arthralgias and gait problem.  Skin: Negative for color change, rash and wound.  Neurological: Negative for dizziness, tremors, seizures, syncope, speech difficulty, weakness, light-headedness, numbness and headaches.  Psychiatric/Behavioral: Negative for suicidal ideas, hallucinations, behavioral problems, confusion, sleep disturbance, dysphoric mood, decreased concentration and  agitation. The patient is not nervous/anxious.        Objective:   Physical Exam  Constitutional: He is oriented to person, place, and time.  HENT:  Head: Normocephalic and atraumatic.  Right Ear: A middle ear effusion is present.  Left Ear: A middle ear effusion is present.  Mouth/Throat: Oropharynx is clear and moist.  Eyes: Conjunctivae and EOM are normal.  Neck: Normal range of motion. Neck supple. No JVD present. No thyromegaly present.  Cardiovascular: Normal rate, regular rhythm and normal heart sounds.   Pulmonary/Chest: Effort normal and breath sounds normal. He has no wheezes. He has no rales.  Abdominal: Soft. Bowel sounds are normal. He exhibits no mass. There is no tenderness. There is no rebound.  Musculoskeletal: Normal range of motion. He exhibits no edema.  Neurological: He is alert and oriented to person, place, and time.  Skin: Skin is warm and dry.  Psychiatric: He has a normal mood and affect.          Assessment & Plan:

## 2011-09-26 NOTE — Patient Instructions (Signed)
Use Delsym for cough suppression (over the counter) or Mucinex DM which has both a mucolytic and cough suppressant  Use Simply Saline nasal rise twice daily,  And Afrin twice daily for 3 days,  Then once daily for 2 days, then stop Afrin   Levaquin  1 tablet daly for 7 days for infection.    Call if you need something stronger for the cough.     Start the Nasonex spray to overlap wit the effect of Afrin .    1) Saline,  Then Nasonex,  Then Afrin   Continue Nasonex and saline indefinitely

## 2011-09-28 DIAGNOSIS — H6690 Otitis media, unspecified, unspecified ear: Secondary | ICD-10-CM | POA: Insufficient documentation

## 2011-09-28 NOTE — Assessment & Plan Note (Signed)
Secondary to prolonged sinus congestion .  Levaquin, Afrin, steorid nasal spray and saline rinses.

## 2011-09-29 DIAGNOSIS — F331 Major depressive disorder, recurrent, moderate: Secondary | ICD-10-CM | POA: Diagnosis not present

## 2011-09-30 ENCOUNTER — Other Ambulatory Visit: Payer: Self-pay | Admitting: *Deleted

## 2011-10-01 MED ORDER — BUPROPION HCL ER (SR) 150 MG PO TB12: ORAL_TABLET | ORAL | Status: DC

## 2011-10-06 DIAGNOSIS — F331 Major depressive disorder, recurrent, moderate: Secondary | ICD-10-CM | POA: Diagnosis not present

## 2011-10-09 DIAGNOSIS — M6530 Trigger finger, unspecified finger: Secondary | ICD-10-CM | POA: Diagnosis not present

## 2011-10-20 DIAGNOSIS — F331 Major depressive disorder, recurrent, moderate: Secondary | ICD-10-CM | POA: Diagnosis not present

## 2011-10-27 DIAGNOSIS — F331 Major depressive disorder, recurrent, moderate: Secondary | ICD-10-CM | POA: Diagnosis not present

## 2011-11-03 DIAGNOSIS — F331 Major depressive disorder, recurrent, moderate: Secondary | ICD-10-CM | POA: Diagnosis not present

## 2011-11-05 ENCOUNTER — Other Ambulatory Visit: Payer: Self-pay | Admitting: Internal Medicine

## 2011-11-05 MED ORDER — ALPRAZOLAM 0.5 MG PO TABS: 0.5000 mg | ORAL_TABLET | Freq: Every evening | ORAL | Status: DC | PRN

## 2011-11-07 DIAGNOSIS — K209 Esophagitis, unspecified without bleeding: Secondary | ICD-10-CM | POA: Diagnosis not present

## 2011-11-10 DIAGNOSIS — F331 Major depressive disorder, recurrent, moderate: Secondary | ICD-10-CM | POA: Diagnosis not present

## 2011-11-14 ENCOUNTER — Telehealth: Payer: Self-pay | Admitting: Internal Medicine

## 2011-11-14 NOTE — Telephone Encounter (Signed)
I talked to patient he stated when this happened before he wore a boot Dr. Penni Bombard prescribed.  He stated he put the boot on this afternoon and will wear it until he can get into see Dr. Penni Bombard.  He is on a cancellation list with Dr. Penni Bombard as well.  He is fine with waiting to see Dr. Penni Bombard.

## 2011-11-14 NOTE — Telephone Encounter (Signed)
[phone] Pt wife called to get appointment with dr Darrick Huntsman today. Pt broke toe 10 month ago  Had 2 pedicure   Last month and 2 months ago.  Pt stated the lady broke his toe again.  Now he cannot  walk without pain, pt states the pain goes all the way up to his groan.  He walk on the side of the foot and this is causing his foot hurt.  Dr Penni Bombard office can not see him till him next week (thursday)

## 2011-11-18 ENCOUNTER — Other Ambulatory Visit: Payer: Self-pay | Admitting: Cardiovascular Disease

## 2011-11-18 ENCOUNTER — Telehealth: Payer: Self-pay | Admitting: Internal Medicine

## 2011-11-18 NOTE — Telephone Encounter (Signed)
[phone] Pt went to see nureo muscular therpist  Harriett Sine yow She wanted mr Sawin to call you to see if you could call him in voltaren or ibprophen cream [facility]   Pt stated that the shot in his hands did not work with [facility]

## 2011-11-19 ENCOUNTER — Telehealth: Payer: Self-pay | Admitting: Internal Medicine

## 2011-11-19 DIAGNOSIS — M6530 Trigger finger, unspecified finger: Secondary | ICD-10-CM

## 2011-11-19 MED ORDER — DICLOFENAC SODIUM 1 % TD GEL: 1.0000 "application " | Freq: Four times a day (QID) | TRANSDERMAL | Status: DC

## 2011-11-19 NOTE — Telephone Encounter (Signed)
Patients wife notified

## 2011-11-19 NOTE — Telephone Encounter (Signed)
volatren cream sent to [facility]

## 2011-11-19 NOTE — Telephone Encounter (Signed)
Patients wife called and stated he recently saw a therapist for his hand and the injections he is receiving are not working.  She suggested Voltaren cream or an ibuprofen cream.  She wanted to know if you would prescribe it.  Please advise.

## 2011-11-20 DIAGNOSIS — M79609 Pain in unspecified limb: Secondary | ICD-10-CM | POA: Diagnosis not present

## 2011-11-25 DIAGNOSIS — F331 Major depressive disorder, recurrent, moderate: Secondary | ICD-10-CM | POA: Diagnosis not present

## 2011-12-01 DIAGNOSIS — F331 Major depressive disorder, recurrent, moderate: Secondary | ICD-10-CM | POA: Diagnosis not present

## 2011-12-10 DIAGNOSIS — F331 Major depressive disorder, recurrent, moderate: Secondary | ICD-10-CM | POA: Diagnosis not present

## 2011-12-15 DIAGNOSIS — F331 Major depressive disorder, recurrent, moderate: Secondary | ICD-10-CM | POA: Diagnosis not present

## 2011-12-17 ENCOUNTER — Other Ambulatory Visit: Payer: Self-pay | Admitting: Internal Medicine

## 2011-12-17 MED ORDER — PAROXETINE HCL ER 25 MG PO TB24: 25.0000 mg | ORAL_TABLET | ORAL | Status: DC

## 2011-12-22 ENCOUNTER — Ambulatory Visit (INDEPENDENT_AMBULATORY_CARE_PROVIDER_SITE_OTHER): Payer: Medicare Other | Admitting: Internal Medicine

## 2011-12-22 ENCOUNTER — Encounter: Payer: Self-pay | Admitting: Internal Medicine

## 2011-12-22 VITALS — BP 114/74 | HR 72 | Temp 97.9°F | Resp 14 | Wt 182.5 lb

## 2011-12-22 DIAGNOSIS — F329 Major depressive disorder, single episode, unspecified: Secondary | ICD-10-CM

## 2011-12-22 DIAGNOSIS — F411 Generalized anxiety disorder: Secondary | ICD-10-CM | POA: Diagnosis not present

## 2011-12-22 DIAGNOSIS — M778 Other enthesopathies, not elsewhere classified: Secondary | ICD-10-CM

## 2011-12-22 DIAGNOSIS — F32A Depression, unspecified: Secondary | ICD-10-CM

## 2011-12-22 DIAGNOSIS — F331 Major depressive disorder, recurrent, moderate: Secondary | ICD-10-CM | POA: Diagnosis not present

## 2011-12-22 DIAGNOSIS — M65839 Other synovitis and tenosynovitis, unspecified forearm: Secondary | ICD-10-CM | POA: Diagnosis not present

## 2011-12-22 DIAGNOSIS — F3289 Other specified depressive episodes: Secondary | ICD-10-CM

## 2011-12-22 DIAGNOSIS — M65849 Other synovitis and tenosynovitis, unspecified hand: Secondary | ICD-10-CM

## 2011-12-22 NOTE — Progress Notes (Signed)
Patient ID: Edwin Woodward, male   DOB: 01/12/1951, 61 y.o.   MRN: [id number]   Patient Active Problem List  Diagnoses  . HYPERLIPIDEMIA-MIXED  . HYPERTENSION, BENIGN  . CAD, NATIVE VESSEL  . Obesity  . OSA on CPAP  . GAD (generalized anxiety disorder)  . Thalassemia  . Tendonitis of wrist, right  . Otitis media    Subjective:  CC:   Chief Complaint  Patient presents with  . Follow-up    HPI:   Edwin Woodward a 61 y.o. male who presentsFollow up on generalized anxiety disorder complicated by depression and ADD and decreased ability to play drums due to his right hand pain. He has episodes of despair and frustration but is not suicidal.   He has multiple somatic complaints today, none of which are major or require further workup. He has seen Dr. Amanda Woodward who has treated him with steroids shots to the palm but is apprehensive about more surgery.  He is seeing [facility] for Neuromuscular therapy since Edwin Woodward has given 3 shots to the palm .  He does have relief with the holistic therapy but it is quite expensive.  He has been smoking a lot of marijuana to manage his anxiety,  Eating poorly so Edwin Woodward has stopped in and started cooking for him since he has declined since their separation. She is with him today.     Past Medical History  Diagnosis Date  . CAD (coronary artery disease)   . Presence of bare metal stent in right coronary artery     proximal RCA adn LAD in April 2009  . Hyperlipidemia   . Hypertension   . Cardiovascular stress test abnormal     Jan 2010 no significant ischemia    Past Surgical History  Procedure Date  . Replacement total knee   . Coronary artery bypass graft     2 vessel  . Joint replacement     total knee  . Appendectomy   . Carpal tunnel release   . Vasectomy          The following portions of the patient's history were reviewed and updated as appropriate: Allergies, current medications, and problem list.    Review of  Systems:   12 Pt  review of systems was negative except those addressed in the HPI,     History   Social History  . Marital Status: Married    Spouse Name: N/A    Number of Children: N/A  . Years of Education: N/A   Occupational History  . Not on file.   Social History Main Topics  . Smoking status: Former Smoker    Types: Cigarettes  . Smokeless tobacco: Never Used  . Alcohol Use: No  . Drug Use: Yes    Special: Marijuana  . Sexually Active: Not on file   Other Topics Concern  . Not on file   Social History Narrative   Lives with wife.    Objective:  BP 114/74  Pulse 72  Temp(Src) 97.9 F (36.6 C) (Oral)  Resp 14  Wt 182 lb 8 oz (82.781 kg)  SpO2 95%  General appearance: alert, cooperative and appears stated age Ears: normal TM's and external ear canals both ears Throat: lips, mucosa, and tongue normal; teeth and gums normal Neck: no adenopathy, no carotid bruit, supple, symmetrical, trachea midline and thyroid not enlarged, symmetric, no tenderness/mass/nodules Back: symmetric, no curvature. ROM normal. No CVA tenderness. Lungs: clear to auscultation bilaterally Heart: regular rate  and rhythm, S1, S2 normal, no murmur, click, rub or gallop Abdomen: soft, non-tender; bowel sounds normal; no masses,  no organomegaly Pulses: 2+ and symmetric Skin: Skin color, texture, turgor normal. No rashes or lesions Lymph nodes: Cervical, supraclavicular, and axillary nodes normal.  Assessment and Plan:  Tendonitis of wrist, right Being managed conservatively with steroid injections and PT>  Will refer to [facility] as the MD there does acupuncture and may provide insurance-covered therapy.   GAD (generalized anxiety disorder) He has not responded well to titration of wellbutrin doses and there is concern about concurrent ADD and depression vs possible bipolar disorder.  He was unhappy with prior management by [facility] bc he only  saw her NP.  I have dicsussed referral to [facility] at [facility] and he is willing to see him. In the interim weill increase the wellbutrin to twice daily 150 jmg,  Followed by increase in morning dose to 200 mg after one week.     Updated Medication List Outpatient Encounter Prescriptions as of 12/22/2011  Medication Sig Dispense Refill  . alfuzosin (UROXATRAL) 10 MG 24 hr tablet Take 10 mg by mouth daily.        Marland Kitchen ALPRAZolam (XANAX XR) 1 MG 24 hr tablet Take 1 tablet (1 mg total) by mouth every morning.  30 tablet  5  . ALPRAZolam (XANAX) 0.5 MG tablet Take 1 tablet (0.5 mg total) by mouth at bedtime as needed.  30 tablet  3  . aspirin 81 MG EC tablet Take 81 mg by mouth daily.        Marland Kitchen atenolol (TENORMIN) 25 MG tablet Take 0.5 tablets (12.5 mg total) by mouth daily.  90 tablet  3  . atorvastatin (LIPITOR) 40 MG tablet Take 1 tablet (40 mg total) by mouth daily.  90 tablet  3  . buPROPion (WELLBUTRIN SR) 150 MG 12 hr tablet One tablet daily at 2 pm  30 tablet  6  . Calcium Carbonate-Vitamin D (CALTRATE 600+D) 600-400 MG-UNIT per tablet Take 2 tablets by mouth daily.        . diclofenac sodium (VOLTAREN) 1 % GEL Apply 1 application topically 4 (four) times daily.  100 g  3  . EFFIENT 10 MG TABS TAKE 1 TABLET BY MOUTH DAILY  30 tablet  6  . lisinopril (PRINIVIL,ZESTRIL) 10 MG tablet Take 1 tablet (10 mg total) by mouth daily.  90 tablet  3  . mometasone (NASONEX) 50 MCG/ACT nasal spray Place 2 sprays into the nose daily.  17 g  12  . niacin (NIASPAN) 1000 MG CR tablet Take 2 tablets (2,000 mg total) by mouth daily.  60 tablet  11  . nitroGLYCERIN (NITROSTAT) 0.4 MG SL tablet Place 1 tablet (0.4 mg total) under the tongue every 5 (five) minutes as needed.  90 tablet  3  . pantoprazole (PROTONIX) 40 MG tablet Take 40 mg by mouth daily.        Marland Kitchen PARoxetine (PAXIL-CR) 25 MG 24 hr tablet Take 1 tablet (25 mg total) by mouth every morning.  30 tablet  6  . DISCONTD: rOPINIRole (REQUIP) 0.25 MG tablet  Take 0.25 mg by mouth 3 (three) times daily.           Orders Placed This Encounter  Procedures  . Ambulatory referral to Psychiatry  . Ambulatory referral to Physical Therapy    No Follow-up on file.

## 2011-12-22 NOTE — Patient Instructions (Addendum)
Increase the wellbutrin to 150 mg twice daily for one week,  Then increase the morning dose to 200 mg and continue 150 mg in the afternoon   Continue the paxil daily and the alprazolam as you are doing   I am referring you to Dr. Antonietta Breach,  The psychiatrist at [facility].     Please do 20 to 3 minutes of fast  walking or bicycling daily to get your heart rate up and help you lose weight    Consider the Low Glycemic Index Diet and 6 smaller meals daily :   7 AM Low carbohydrate Protein  Shakes (EAS Carb Control  Or Atkins ,  Available everywhere,   In  cases at [employer] )  2.5 carbs  (Add or substitute a toasted sandwhich thin w/ peanut butter)  10 AM: Protein bar by Atkins (snack size,  Chocolate lover's variety at  [employer])    Lunch: sandwich on pita bread or flatbread (Joseph's makes a pita bread and a flat bread , available at [employer] and [employer]; Toufayah makes a low carb flatbread available at [employer] and [employer])   3 PM:  Mid day :  Another protein bar,  Or a  cheese stick, 1/4 cup of almonds, walnuts, pistachios, pecans, peanuts,  Macadamia nuts  6 PM  Dinner:  "mean and green:"  Meat/chicken/fish, salad, and green veggie : use ranch, vinagrette,  Blue cheese, etc  9 PM snack : Breyer's low carb fudgiscle or  ice cream bar (Carb Smart) Weight Watcher's ice cream bar , or another protein shake

## 2011-12-23 ENCOUNTER — Telehealth: Payer: Self-pay | Admitting: Internal Medicine

## 2011-12-23 NOTE — Telephone Encounter (Signed)
He is supposed to take 150 mg mg twice daily , and Carley Hammed has some 200's at home that she was going to reuse when we increase his dose.  Just call in the 150's for refill. #60 with 1 refill

## 2011-12-23 NOTE — Telephone Encounter (Signed)
Amanda at [facility] called and stated the wellbutrin tablets can't be cut in half for the 200 mg morning dose because they are sustained release tablets.  She stated he could do 300 mg in the morning but she didn't know if that would be too much for him.  Please advise.

## 2011-12-24 ENCOUNTER — Telehealth: Payer: Self-pay | Admitting: Internal Medicine

## 2011-12-24 NOTE — Telephone Encounter (Signed)
Yes, she is the one I want him to see. Thank you

## 2011-12-24 NOTE — Assessment & Plan Note (Signed)
Being managed conservatively with steroid injections and PT>  Will refer to [facility] as the MD there does acupuncture and may provide insurance-covered therapy.

## 2011-12-24 NOTE — Telephone Encounter (Signed)
[facility] is not making appointments for patients at this time.  The only dr Lenise Arena I could find at [facility] is Lenice Pressman who specializes in hand therapy.  Do you want me to schedule this patient with her.  No one in the physical therapy department does acupuncture.

## 2011-12-24 NOTE — Telephone Encounter (Signed)
Pharmacy has been notified.  Rx called in.

## 2011-12-24 NOTE — Assessment & Plan Note (Addendum)
He has not responded well to titration of wellbutrin doses and there is concern about concurrent ADD and depression vs possible bipolar disorder.  He was unhappy with prior management by [facility] psychiatrist Nolen Mu bc he only saw her NP.  I have dicsussed referral to Rico Junker at [facility] and he is willing to see him. In the interim weill increase the wellbutrin to twice daily 150 jmg,  Followed by increase in morning dose to 200 mg after one week.

## 2011-12-29 ENCOUNTER — Telehealth: Payer: Self-pay | Admitting: Internal Medicine

## 2011-12-29 DIAGNOSIS — F331 Major depressive disorder, recurrent, moderate: Secondary | ICD-10-CM | POA: Diagnosis not present

## 2011-12-29 DIAGNOSIS — M79641 Pain in right hand: Secondary | ICD-10-CM

## 2011-12-29 NOTE — Telephone Encounter (Signed)
SPOKE WITH SUE MEYER.  SHE SAID THE ORDER NEEDS TO READ OCCUPATIONAL THERAPY NOT PHYSICAL THERAPY.  PLEASE CHANGE THE ORDER TO REFLECT THAT.  ALSO THE ORDER NEEDS TO BE SIGNED BY DR Darrick Huntsman AND THEN FAXED TO [phone] WHICH IS THE SCHEDULER FOR SUE MEYER.  THEY WILL CALL THE PATIENT AND SCHEDULE

## 2011-12-29 NOTE — Telephone Encounter (Signed)
thanks

## 2011-12-29 NOTE — Telephone Encounter (Signed)
Order has been faxed

## 2011-12-31 NOTE — Telephone Encounter (Signed)
Close  

## 2012-01-01 ENCOUNTER — Telehealth: Payer: Self-pay | Admitting: Internal Medicine

## 2012-01-01 DIAGNOSIS — M65839 Other synovitis and tenosynovitis, unspecified forearm: Secondary | ICD-10-CM

## 2012-01-01 NOTE — Telephone Encounter (Signed)
The referral for physical therapy needs to be changed to an order for occupational therapy and faxed to [facility] [phone].  Her staff will call and schedule with the patient

## 2012-01-02 NOTE — Telephone Encounter (Signed)
Can you please place order for occupational therapy instead of physical therapy.  Per Marj see previous note.

## 2012-01-02 NOTE — Telephone Encounter (Signed)
The order has been ordered, signed, and faxed to [facility].

## 2012-01-05 ENCOUNTER — Other Ambulatory Visit: Payer: Self-pay

## 2012-01-05 DIAGNOSIS — E785 Hyperlipidemia, unspecified: Secondary | ICD-10-CM

## 2012-01-05 DIAGNOSIS — I2510 Atherosclerotic heart disease of native coronary artery without angina pectoris: Secondary | ICD-10-CM

## 2012-01-12 DIAGNOSIS — F331 Major depressive disorder, recurrent, moderate: Secondary | ICD-10-CM | POA: Diagnosis not present

## 2012-01-13 ENCOUNTER — Other Ambulatory Visit: Payer: Medicare Other

## 2012-01-13 DIAGNOSIS — I2510 Atherosclerotic heart disease of native coronary artery without angina pectoris: Secondary | ICD-10-CM

## 2012-01-13 DIAGNOSIS — E785 Hyperlipidemia, unspecified: Secondary | ICD-10-CM | POA: Diagnosis not present

## 2012-01-14 LAB — LIPID PANEL
Chol/HDL Ratio: 2.1 ratio units (ref 0.0–5.0)
Cholesterol, Total: 97 mg/dL — ABNORMAL LOW (ref 100–199)
LDL Calculated: 40 mg/dL (ref 0–99)
Triglycerides: 56 mg/dL (ref 0–149)
VLDL Cholesterol Cal: 11 mg/dL (ref 5–40)

## 2012-01-14 LAB — HEPATIC FUNCTION PANEL
Albumin: 4.4 g/dL (ref 3.6–4.8)
Alkaline Phosphatase: 83 IU/L (ref 25–160)
Total Protein: 6.8 g/dL (ref 6.0–8.5)

## 2012-01-19 DIAGNOSIS — F331 Major depressive disorder, recurrent, moderate: Secondary | ICD-10-CM | POA: Diagnosis not present

## 2012-01-26 DIAGNOSIS — F331 Major depressive disorder, recurrent, moderate: Secondary | ICD-10-CM | POA: Diagnosis not present

## 2012-01-27 ENCOUNTER — Other Ambulatory Visit: Payer: Self-pay | Admitting: Internal Medicine

## 2012-01-28 MED ORDER — ALPRAZOLAM ER 1 MG PO TB24: 1.0000 mg | ORAL_TABLET | ORAL | Status: DC

## 2012-01-29 ENCOUNTER — Ambulatory Visit: Payer: Medicare Other | Admitting: Cardiovascular Disease

## 2012-01-30 ENCOUNTER — Encounter: Payer: Self-pay | Admitting: Cardiovascular Disease

## 2012-01-30 ENCOUNTER — Ambulatory Visit (INDEPENDENT_AMBULATORY_CARE_PROVIDER_SITE_OTHER): Payer: Medicare Other | Admitting: Cardiovascular Disease

## 2012-01-30 VITALS — BP 120/80 | HR 67 | Ht 63.0 in | Wt 174.0 lb

## 2012-01-30 DIAGNOSIS — I10 Essential (primary) hypertension: Secondary | ICD-10-CM

## 2012-01-30 DIAGNOSIS — E785 Hyperlipidemia, unspecified: Secondary | ICD-10-CM

## 2012-01-30 DIAGNOSIS — I2510 Atherosclerotic heart disease of native coronary artery without angina pectoris: Secondary | ICD-10-CM

## 2012-01-30 DIAGNOSIS — E669 Obesity, unspecified: Secondary | ICD-10-CM

## 2012-01-30 MED ORDER — CLOPIDOGREL BISULFATE 75 MG PO TABS: 75.0000 mg | ORAL_TABLET | Freq: Every day | ORAL | Status: DC

## 2012-01-30 NOTE — Patient Instructions (Signed)
You are doing well.  Take lipitor 40 mg alternating with 20 mg pill every other day  When you run out of effient, change to plavix Continue on aspirin 81 mg daily  Please call us if you have new issues that need to be addressed before your next appt.  Your physician wants you to follow-up in: 6 months.  You will receive a reminder letter in the mail two months in advance. If you don't receive a letter, please call our office to schedule the follow-up appointment.

## 2012-01-30 NOTE — Assessment & Plan Note (Signed)
Cholesterol is very low. I have suggested he take Lipitor 40 alternating with 20 mg every other day. His weight has been slowly trending downward which may explain the drop in his cholesterol.

## 2012-01-30 NOTE — Assessment & Plan Note (Signed)
Blood pressure is well controlled on today's visit. No changes made to the medications. 

## 2012-01-30 NOTE — Progress Notes (Signed)
Patient ID: Edwin Woodward, male    DOB: Apr 17, 1951, 61 y.o.   MRN: [id number]  HPI Comments: Edwin Woodward Is a very pleasant 61 year old gentleman who has a history of coronary artery disease, bare-metal stents placed to his proximal RCA and LAD in April 2009, history of total knee replacements bilaterally, hypertension, hyperlipidemia, anxiety and ADHD who presents for routine followup.   Overall he is doing well. He is currently separated from his wife though she still takes care of his medications and bills. He has been receiving massage treatment for his trigger finger which has been helping. He continues to play with his drums. He would like to start working out with his friend who is a Pharmacist, hospital. He has no new complaints. No significant chest pain or shortness of breath.  Most recent cholesterol is in the 90 range   EKG shows normal sinus rhythm with rate 67 beats per minute, right bundle branch block, no significant ST or T wave changes, LAFB        Outpatient Encounter Prescriptions as of 01/30/2012  Medication Sig Dispense Refill  . alfuzosin (UROXATRAL) 10 MG 24 hr tablet Take 10 mg by mouth daily.        Marland Kitchen ALPRAZolam (XANAX XR) 1 MG 24 hr tablet Take 1 tablet (1 mg total) by mouth every morning.  30 tablet  5  . ALPRAZolam (XANAX) 0.5 MG tablet Take 1 tablet (0.5 mg total) by mouth at bedtime as needed.  30 tablet  3  . aspirin 81 MG EC tablet Take 81 mg by mouth daily.        Marland Kitchen atenolol (TENORMIN) 12.5 mg TABS Take 12.5 mg by mouth daily.      Marland Kitchen atorvastatin (LIPITOR) 40 MG tablet Take 1 tablet (40 mg total) by mouth daily.  90 tablet  3  . buPROPion (WELLBUTRIN SR) 150 MG 12 hr tablet Take one tablet in the am & one tablet @@ 2 pm daily.      . Calcium Carbonate-Vitamin D (CALTRATE 600+D) 600-400 MG-UNIT per tablet Take 2 tablets by mouth daily.        . diclofenac sodium (VOLTAREN) 1 % GEL Apply 1 application topically 4 (four) times daily.  100 g  3  . lisinopril  (PRINIVIL,ZESTRIL) 5 MG tablet Take 5 mg by mouth daily as needed.      . mometasone (NASONEX) 50 MCG/ACT nasal spray Place 2 sprays into the nose daily.  17 g  12  . niacin (NIASPAN) 1000 MG CR tablet Take 1,000 mg by mouth daily.      . nitroGLYCERIN (NITROSTAT) 0.4 MG SL tablet Place 1 tablet (0.4 mg total) under the tongue every 5 (five) minutes as needed.  90 tablet  3  . pantoprazole (PROTONIX) 40 MG tablet Take 40 mg by mouth daily.        Marland Kitchen PARoxetine (PAXIL-CR) 25 MG 24 hr tablet Take 1 tablet (25 mg total) by mouth every morning.  30 tablet  6  .  EFFIENT 10 MG TABS TAKE 1 TABLET BY MOUTH DAILY  30 tablet  6      Review of Systems  Constitutional: Negative.   HENT: Negative.   Eyes: Negative.   Respiratory: Negative.   Cardiovascular: Negative.   Gastrointestinal: Negative.   Musculoskeletal:       Trigger finger  Skin: Negative.   Neurological: Negative.   Hematological: Negative.   Psychiatric/Behavioral: The patient is nervous/anxious.   All other systems  reviewed and are negative.    BP 120/80  Pulse 67  Ht 5\' 3"  (1.6 m)  Wt 174 lb (78.926 kg)  BMI 30.82 kg/m2  Physical Exam  Nursing note and vitals reviewed. Constitutional: He is oriented to person, place, and time. He appears well-developed and well-nourished.       Mild obesity  HENT:  Head: Normocephalic.  Nose: Nose normal.  Mouth/Throat: Oropharynx is clear and moist.  Eyes: Conjunctivae are normal. Pupils are equal, round, and reactive to light.  Neck: Normal range of motion. Neck supple. No JVD present.  Cardiovascular: Normal rate, regular rhythm, S1 normal, S2 normal, normal heart sounds and intact distal pulses.  Exam reveals no gallop and no friction rub.   No murmur heard. Pulmonary/Chest: Effort normal and breath sounds normal. No respiratory distress. He has no wheezes. He has no rales. He exhibits no tenderness.  Abdominal: Soft. Bowel sounds are normal. He exhibits no distension. There is  no tenderness.  Musculoskeletal: Normal range of motion. He exhibits no edema and no tenderness.  Lymphadenopathy:    He has no cervical adenopathy.  Neurological: He is alert and oriented to person, place, and time. Coordination normal.  Skin: Skin is warm and dry. No rash noted. No erythema.  Psychiatric: He has a normal mood and affect. His behavior is normal. Judgment and thought content normal.           Assessment and Plan

## 2012-01-30 NOTE — Assessment & Plan Note (Signed)
We have encouraged continued exercise, careful diet management in an effort to lose weight. 

## 2012-01-30 NOTE — Assessment & Plan Note (Addendum)
Currently with no symptoms of angina. No further workup at this time. Continue current medication regimen. We will change his effient to Plavix. Effient is no longer needed as it has been years since his stent placement and he is not a diabetic.

## 2012-02-05 DIAGNOSIS — F321 Major depressive disorder, single episode, moderate: Secondary | ICD-10-CM | POA: Diagnosis not present

## 2012-02-05 DIAGNOSIS — F411 Generalized anxiety disorder: Secondary | ICD-10-CM | POA: Diagnosis not present

## 2012-02-12 DIAGNOSIS — F331 Major depressive disorder, recurrent, moderate: Secondary | ICD-10-CM | POA: Diagnosis not present

## 2012-02-16 DIAGNOSIS — H2589 Other age-related cataract: Secondary | ICD-10-CM | POA: Diagnosis not present

## 2012-02-26 DIAGNOSIS — F331 Major depressive disorder, recurrent, moderate: Secondary | ICD-10-CM | POA: Diagnosis not present

## 2012-03-02 DIAGNOSIS — F331 Major depressive disorder, recurrent, moderate: Secondary | ICD-10-CM | POA: Diagnosis not present

## 2012-03-15 DIAGNOSIS — IMO0002 Reserved for concepts with insufficient information to code with codable children: Secondary | ICD-10-CM | POA: Diagnosis not present

## 2012-03-18 DIAGNOSIS — F331 Major depressive disorder, recurrent, moderate: Secondary | ICD-10-CM | POA: Diagnosis not present

## 2012-03-18 DIAGNOSIS — IMO0002 Reserved for concepts with insufficient information to code with codable children: Secondary | ICD-10-CM | POA: Diagnosis not present

## 2012-03-25 DIAGNOSIS — F331 Major depressive disorder, recurrent, moderate: Secondary | ICD-10-CM | POA: Diagnosis not present

## 2012-03-28 ENCOUNTER — Emergency Department: Payer: Self-pay | Admitting: Emergency Medicine

## 2012-03-28 DIAGNOSIS — K089 Disorder of teeth and supporting structures, unspecified: Secondary | ICD-10-CM | POA: Diagnosis not present

## 2012-03-28 DIAGNOSIS — R51 Headache: Secondary | ICD-10-CM | POA: Diagnosis not present

## 2012-03-28 LAB — CBC WITH DIFFERENTIAL/PLATELET
Basophil #: 0 10*3/uL (ref 0.0–0.1)
Eosinophil %: 1.2 %
Lymphocyte %: 21.7 %
MCV: 68 fL — ABNORMAL LOW (ref 80–100)
Monocyte #: 1.1 x10 3/mm — ABNORMAL HIGH (ref 0.2–1.0)
Monocyte %: 10.4 %
Platelet: 223 10*3/uL (ref 150–440)
RBC: 6.14 10*6/uL — ABNORMAL HIGH (ref 4.40–5.90)
RDW: 15.9 % — ABNORMAL HIGH (ref 11.5–14.5)
WBC: 10.7 10*3/uL — ABNORMAL HIGH (ref 3.8–10.6)

## 2012-03-28 LAB — URINALYSIS, COMPLETE
Blood: NEGATIVE
Nitrite: NEGATIVE
Protein: NEGATIVE
RBC,UR: 1 /HPF (ref 0–5)
Specific Gravity: 1.026 (ref 1.003–1.030)
Squamous Epithelial: NONE SEEN
WBC UR: 5 /HPF (ref 0–5)

## 2012-03-28 LAB — BASIC METABOLIC PANEL
BUN: 18 mg/dL (ref 7–18)
Chloride: 104 mmol/L (ref 98–107)
Creatinine: 0.87 mg/dL (ref 0.60–1.30)
EGFR (African American): >60
EGFR (Non-African Amer.): >60
Glucose: 100 mg/dL — ABNORMAL HIGH (ref 65–99)
Osmolality: 281 (ref 275–301)
Potassium: 4.1 mmol/L (ref 3.5–5.1)

## 2012-03-29 ENCOUNTER — Telehealth: Payer: Self-pay

## 2012-03-29 NOTE — Telephone Encounter (Signed)
Pt to have tooth extraction in near future. DDS wants to know if ok to hold plavix/ASA. Last stent 2009. Thanks

## 2012-04-01 NOTE — Telephone Encounter (Signed)
Would hold Plavix, continue aspirin 81 mg daily This should not cause excessive bleeding

## 2012-04-02 NOTE — Telephone Encounter (Signed)
LMTCB

## 2012-04-02 NOTE — Telephone Encounter (Signed)
Pt's wife called back.   Says we can fax info to [facility] in [place] Will fax to them

## 2012-04-05 ENCOUNTER — Telehealth: Payer: Self-pay | Admitting: Internal Medicine

## 2012-04-05 ENCOUNTER — Encounter: Payer: Self-pay | Admitting: Internal Medicine

## 2012-04-05 DIAGNOSIS — R0789 Other chest pain: Secondary | ICD-10-CM

## 2012-04-05 DIAGNOSIS — F331 Major depressive disorder, recurrent, moderate: Secondary | ICD-10-CM | POA: Diagnosis not present

## 2012-04-05 NOTE — Telephone Encounter (Signed)
I will send x ray order to [facility] to rule out fracture. I can call him in hydrocodone for the pain if he can tolerate Vicodin. He has a codeine allergy. I mean L., ; he needs to apply ice to the swollen area for 15 minutes at a time several times daily.

## 2012-04-05 NOTE — Telephone Encounter (Signed)
Caller: Eva/Spouse; PCP: Duncan Dull; CB#: [phone]; Call regarding Pt Fell On 07/20 x 2 - FSBG "in diabetic range" by paramedics on the scene.  Went to [facility] 7/21.  Spent 10 hours at [facility] and he fell again on 7/22- landing on his ribs. ,  but refused to go to ED.  Dx w/ Tooth abscess that caused inner ear problems and that seems to have improved.      Right Rib pain.  Softball to melon sized bruising  on rib area. He will not wear C-PAP at night because lying on the right side causes pain; he is irritable, not sleeping.   See in 4 hours per Chest Injury protocol.  No appointments available at office today.  Note to office for follow up. Home care for the interim.

## 2012-04-06 NOTE — Telephone Encounter (Signed)
Left message asking patient to return my call.

## 2012-04-07 ENCOUNTER — Ambulatory Visit (INDEPENDENT_AMBULATORY_CARE_PROVIDER_SITE_OTHER)
Admission: RE | Admit: 2012-04-07 | Discharge: 2012-04-07 | Disposition: A | Discharge status: Home / Self Care | Payer: Medicare Other | Source: Ambulatory Visit | Attending: Internal Medicine | Admitting: Internal Medicine

## 2012-04-07 ENCOUNTER — Other Ambulatory Visit: Payer: Self-pay

## 2012-04-07 ENCOUNTER — Telehealth: Payer: Self-pay | Admitting: Internal Medicine

## 2012-04-07 ENCOUNTER — Other Ambulatory Visit: Payer: Self-pay | Admitting: Internal Medicine

## 2012-04-07 DIAGNOSIS — R0789 Other chest pain: Secondary | ICD-10-CM

## 2012-04-07 DIAGNOSIS — Z043 Encounter for examination and observation following other accident: Secondary | ICD-10-CM | POA: Diagnosis not present

## 2012-04-07 DIAGNOSIS — S2239XA Fracture of one rib, unspecified side, initial encounter for closed fracture: Secondary | ICD-10-CM | POA: Diagnosis not present

## 2012-04-07 MED ORDER — CLINDAMYCIN HCL 300 MG PO CAPS: ORAL_CAPSULE | ORAL | Status: DC

## 2012-04-07 NOTE — Telephone Encounter (Signed)
Pt calling [facility] to get anitbodic for dentist appointment today need rx called in by 10 am today.   [facility].

## 2012-04-07 NOTE — Telephone Encounter (Signed)
Pt called to say he is having dental work at 1100 this am and needs prophylactic abx sent to pharmacy by 1000 this am. He denies having valve replacement/other cardiac conditions that require prophylactic abx prior to dental work. I explained this to him. He verb. Understanding and thinks it is more for knee replacement protocol. I told him Dr. Mariah Milling would be in office this am and I will ask him at that time  I realized Dr. Mariah Milling not in office until 1000 this morning and Dr. Ignacia Palma. Dan Humphreys have been the MDs in the past that have been prescribing his abx for dental work. I was able to call their office and leave urgent message for them to send abx RX by 1000  Pt's wife made aware. Understanding verb.

## 2012-04-07 NOTE — Telephone Encounter (Signed)
Patients wife came by the office and I gave her the message, she stated he does not need the hydrocodone because of past problems.  He will go for the x ray.

## 2012-04-08 DIAGNOSIS — F321 Major depressive disorder, single episode, moderate: Secondary | ICD-10-CM | POA: Diagnosis not present

## 2012-04-10 ENCOUNTER — Other Ambulatory Visit: Payer: Self-pay | Admitting: Cardiovascular Disease

## 2012-04-12 ENCOUNTER — Telehealth: Payer: Self-pay | Admitting: Internal Medicine

## 2012-04-12 ENCOUNTER — Other Ambulatory Visit: Payer: Self-pay | Admitting: *Deleted

## 2012-04-12 MED ORDER — ATORVASTATIN CALCIUM 40 MG PO TABS: 40.0000 mg | ORAL_TABLET | Freq: Every day | ORAL | Status: DC

## 2012-04-12 NOTE — Telephone Encounter (Signed)
Refilled Lipitor

## 2012-04-12 NOTE — Telephone Encounter (Signed)
[facility] told him to call us For his rx lipator Pt is going out of town today on emergency  Can you call this in ASAP Please adivse pt when called him

## 2012-04-12 NOTE — Telephone Encounter (Signed)
Dr. Windell Hummingbird office has already called this medication in today.  [facility] and [facility] did [facility] the [facility].

## 2012-04-22 ENCOUNTER — Other Ambulatory Visit: Payer: Self-pay | Admitting: Cardiovascular Disease

## 2012-04-22 MED ORDER — NIACIN ER (ANTIHYPERLIPIDEMIC) 1000 MG PO TBCR: 1000.0000 mg | EXTENDED_RELEASE_TABLET | Freq: Every day | ORAL | Status: DC

## 2012-04-22 NOTE — Telephone Encounter (Signed)
Refilled Niaspan. 

## 2012-04-22 NOTE — Telephone Encounter (Signed)
Pt would like script sent into pharmacy also. Picked up samples

## 2012-04-26 DIAGNOSIS — IMO0002 Reserved for concepts with insufficient information to code with codable children: Secondary | ICD-10-CM | POA: Diagnosis not present

## 2012-04-26 DIAGNOSIS — F331 Major depressive disorder, recurrent, moderate: Secondary | ICD-10-CM | POA: Diagnosis not present

## 2012-05-04 ENCOUNTER — Ambulatory Visit: Payer: Self-pay | Admitting: Ophthalmology

## 2012-05-04 DIAGNOSIS — IMO0002 Reserved for concepts with insufficient information to code with codable children: Secondary | ICD-10-CM | POA: Diagnosis not present

## 2012-05-04 DIAGNOSIS — N400 Enlarged prostate without lower urinary tract symptoms: Secondary | ICD-10-CM | POA: Diagnosis not present

## 2012-05-04 DIAGNOSIS — K219 Gastro-esophageal reflux disease without esophagitis: Secondary | ICD-10-CM | POA: Diagnosis not present

## 2012-05-04 DIAGNOSIS — D563 Thalassemia minor: Secondary | ICD-10-CM | POA: Diagnosis not present

## 2012-05-04 DIAGNOSIS — R51 Headache: Secondary | ICD-10-CM | POA: Diagnosis not present

## 2012-05-04 DIAGNOSIS — F411 Generalized anxiety disorder: Secondary | ICD-10-CM | POA: Diagnosis not present

## 2012-05-04 DIAGNOSIS — R0602 Shortness of breath: Secondary | ICD-10-CM | POA: Diagnosis not present

## 2012-05-04 DIAGNOSIS — I10 Essential (primary) hypertension: Secondary | ICD-10-CM | POA: Diagnosis not present

## 2012-05-04 DIAGNOSIS — M19049 Primary osteoarthritis, unspecified hand: Secondary | ICD-10-CM | POA: Diagnosis not present

## 2012-05-04 DIAGNOSIS — G4730 Sleep apnea, unspecified: Secondary | ICD-10-CM | POA: Diagnosis not present

## 2012-05-04 DIAGNOSIS — F909 Attention-deficit hyperactivity disorder, unspecified type: Secondary | ICD-10-CM | POA: Diagnosis not present

## 2012-05-04 DIAGNOSIS — Z79899 Other long term (current) drug therapy: Secondary | ICD-10-CM | POA: Diagnosis not present

## 2012-05-04 DIAGNOSIS — Z96659 Presence of unspecified artificial knee joint: Secondary | ICD-10-CM | POA: Diagnosis not present

## 2012-05-04 DIAGNOSIS — H9190 Unspecified hearing loss, unspecified ear: Secondary | ICD-10-CM | POA: Diagnosis not present

## 2012-05-04 DIAGNOSIS — E7800 Pure hypercholesterolemia, unspecified: Secondary | ICD-10-CM | POA: Diagnosis not present

## 2012-05-04 DIAGNOSIS — Z7982 Long term (current) use of aspirin: Secondary | ICD-10-CM | POA: Diagnosis not present

## 2012-05-04 DIAGNOSIS — Z87891 Personal history of nicotine dependence: Secondary | ICD-10-CM | POA: Diagnosis not present

## 2012-05-04 DIAGNOSIS — R209 Unspecified disturbances of skin sensation: Secondary | ICD-10-CM | POA: Diagnosis not present

## 2012-05-04 DIAGNOSIS — M949 Disorder of cartilage, unspecified: Secondary | ICD-10-CM | POA: Diagnosis not present

## 2012-05-04 DIAGNOSIS — F988 Other specified behavioral and emotional disorders with onset usually occurring in childhood and adolescence: Secondary | ICD-10-CM | POA: Diagnosis not present

## 2012-05-04 DIAGNOSIS — Z9109 Other allergy status, other than to drugs and biological substances: Secondary | ICD-10-CM | POA: Diagnosis not present

## 2012-05-04 DIAGNOSIS — M899 Disorder of bone, unspecified: Secondary | ICD-10-CM | POA: Diagnosis not present

## 2012-05-12 DIAGNOSIS — F331 Major depressive disorder, recurrent, moderate: Secondary | ICD-10-CM | POA: Diagnosis not present

## 2012-05-19 DIAGNOSIS — F331 Major depressive disorder, recurrent, moderate: Secondary | ICD-10-CM | POA: Diagnosis not present

## 2012-05-27 DIAGNOSIS — F331 Major depressive disorder, recurrent, moderate: Secondary | ICD-10-CM | POA: Diagnosis not present

## 2012-05-31 DIAGNOSIS — F331 Major depressive disorder, recurrent, moderate: Secondary | ICD-10-CM | POA: Diagnosis not present

## 2012-06-07 ENCOUNTER — Telehealth: Payer: Self-pay

## 2012-06-07 NOTE — Telephone Encounter (Signed)
What other medication do you recommend he replace his niaspan since taken off the market. Please advise what to do. Patient would like [facility] [place]. His phone number is not listed at this moment.

## 2012-06-07 NOTE — Telephone Encounter (Signed)
Would do without for now. No other good substitute Will recheck lipids in follow up If hdl ok, may not need

## 2012-06-07 NOTE — Telephone Encounter (Signed)
The patient states we may call his home phone number with instructions.

## 2012-06-08 DIAGNOSIS — F321 Major depressive disorder, single episode, moderate: Secondary | ICD-10-CM | POA: Diagnosis not present

## 2012-06-08 NOTE — Telephone Encounter (Signed)
No answer

## 2012-06-09 DIAGNOSIS — F331 Major depressive disorder, recurrent, moderate: Secondary | ICD-10-CM | POA: Diagnosis not present

## 2012-06-09 NOTE — Telephone Encounter (Signed)
Notified patient per Dr. Mariah Milling would do without for now; no other substitute and would need to have lipids checked. Told if HDL ok, may not need the niaspan.

## 2012-06-14 DIAGNOSIS — F331 Major depressive disorder, recurrent, moderate: Secondary | ICD-10-CM | POA: Diagnosis not present

## 2012-06-17 ENCOUNTER — Other Ambulatory Visit: Payer: Self-pay | Admitting: Cardiovascular Disease

## 2012-06-18 ENCOUNTER — Other Ambulatory Visit: Payer: Self-pay | Admitting: *Deleted

## 2012-06-18 MED ORDER — ATENOLOL 12.5 MG HALF TABLET: 12.5000 mg | ORAL_TABLET | Freq: Every day | ORAL | Status: DC

## 2012-06-18 NOTE — Telephone Encounter (Signed)
Refilled Atenolol

## 2012-06-21 DIAGNOSIS — F331 Major depressive disorder, recurrent, moderate: Secondary | ICD-10-CM | POA: Diagnosis not present

## 2012-06-28 DIAGNOSIS — K4090 Unilateral inguinal hernia, without obstruction or gangrene, not specified as recurrent: Secondary | ICD-10-CM | POA: Insufficient documentation

## 2012-06-28 DIAGNOSIS — N401 Enlarged prostate with lower urinary tract symptoms: Secondary | ICD-10-CM | POA: Diagnosis not present

## 2012-06-28 DIAGNOSIS — N430 Encysted hydrocele: Secondary | ICD-10-CM | POA: Diagnosis not present

## 2012-06-28 DIAGNOSIS — N411 Chronic prostatitis: Secondary | ICD-10-CM | POA: Diagnosis not present

## 2012-07-15 DIAGNOSIS — F331 Major depressive disorder, recurrent, moderate: Secondary | ICD-10-CM | POA: Diagnosis not present

## 2012-07-21 ENCOUNTER — Telehealth: Payer: Self-pay

## 2012-07-21 NOTE — Telephone Encounter (Signed)
Refill request for Xanax XR 1 mg ok to refill

## 2012-07-23 ENCOUNTER — Other Ambulatory Visit: Payer: Self-pay

## 2012-07-23 MED ORDER — ALPRAZOLAM ER 1 MG PO TB24: 1.0000 mg | ORAL_TABLET | ORAL | Status: DC

## 2012-07-23 NOTE — Telephone Encounter (Signed)
Xanax 1 mg XR [facility]

## 2012-07-23 NOTE — Telephone Encounter (Signed)
Ok to refill,  Authorized in epic 

## 2012-07-23 NOTE — Telephone Encounter (Signed)
Refill request for Xanax XR 1. Ok to refill?

## 2012-07-30 ENCOUNTER — Encounter: Payer: Self-pay | Admitting: Internal Medicine

## 2012-07-30 ENCOUNTER — Ambulatory Visit (INDEPENDENT_AMBULATORY_CARE_PROVIDER_SITE_OTHER): Payer: Medicare Other | Admitting: Internal Medicine

## 2012-07-30 VITALS — BP 130/76 | HR 67 | Temp 98.2°F | Resp 12 | Ht 63.5 in | Wt 195.5 lb

## 2012-07-30 DIAGNOSIS — J069 Acute upper respiratory infection, unspecified: Secondary | ICD-10-CM | POA: Diagnosis not present

## 2012-07-30 DIAGNOSIS — Z139 Encounter for screening, unspecified: Secondary | ICD-10-CM

## 2012-07-30 LAB — POCT INFLUENZA A/B: Influenza B, POC: NEGATIVE

## 2012-07-30 MED ORDER — LEVOFLOXACIN 500 MG PO TABS: 500.0000 mg | ORAL_TABLET | Freq: Every day | ORAL | Status: DC

## 2012-07-30 NOTE — Patient Instructions (Addendum)
You do not have the flu  You have a sinus/ear infection. I am prescribing an antibiotic (levaquin)  To manage the infection   I also advise use of the following OTC meds to help with your other symptoms.   Take generic OTC benadryl 25 mg every 8 hours for the drainage,  Sudafed PE  10 to 30 mg every 8 hours for the congestion, you may substitute Afrin nasal spray for the nighttime dose of sudafed PE  If needed to prevent insomnia.  flushes your sinuses twice daily with Simply Saline (do over the sink because if you do it right you will spit out globs of mucus)  Use  OTC  Delsym (dextromethorphan, a cough suppressant)   FOR THE COUGH.  Use mucinex/ guaifenesin (expectorant) to thin out the mucus  Gargle with salt water as needed for sore throat.

## 2012-07-30 NOTE — Progress Notes (Signed)
Patient ID: Edwin Woodward, male   DOB: 1951/04/15, 61 y.o.   MRN: [id number]  Patient Active Problem List  Diagnosis  . HYPERLIPIDEMIA-MIXED  . HYPERTENSION, BENIGN  . CAD, NATIVE VESSEL  . Obesity  . OSA on CPAP  . GAD (generalized anxiety disorder)  . Thalassemia  . Tendonitis of wrist, right  . Otitis media  . Acute URI of multiple sites    Subjective:  CC:   Chief Complaint  Patient presents with  . Cough    HPI:   Edwin Woodward a 61 y.o. male who presents with   Post nasal drip,  Occasional cough,  Ears feel full. Subjective fever 2 days ago .  No usual myalgias.   symptoms started about 2 weeks ago and nothing has improved.  Has been taking mucinex 2 days ago along with nasonex. Morning sputum production is brown and green for over a week,.     Past Medical History  Diagnosis Date  . CAD (coronary artery disease)   . Presence of bare metal stent in right coronary artery     proximal RCA adn LAD in April 2009  . Hyperlipidemia   . Hypertension   . Cardiovascular stress test abnormal     Jan 2010 no significant ischemia  . Trigger finger     Past Surgical History  Procedure Date  . Replacement total knee   . Coronary artery bypass graft     2 vessel  . Joint replacement     total knee  . Appendectomy   . Carpal tunnel release   . Vasectomy   . Coronary angioplasty 12/2007  . Appendectomy   . Tonsillectomy          The following portions of the patient's history were reviewed and updated as appropriate: Allergies, current medications, and problem list.    Review of Systems:   12 Pt  review of systems was negative except those addressed in the HPI,     History   Social History  . Marital Status: Married    Spouse Name: N/A    Number of Children: N/A  . Years of Education: N/A   Occupational History  . Not on file.   Social History Main Topics  . Smoking status: Current Some Day Smoker  . Smokeless tobacco: Never Used    . Alcohol Use: No  . Drug Use: Yes    Special: Marijuana  . Sexually Active: Not on file   Other Topics Concern  . Not on file   Social History Narrative   Lives with wife.    Objective:  BP 130/76  Pulse 67  Temp 98.2 F (36.8 C) (Oral)  Resp 12  Ht 5' 3.5" (1.613 m)  Wt 195 lb 8 oz (88.678 kg)  BMI 34.09 kg/m2  SpO2 96%  General appearance: alert, cooperative and appears stated age Ears: erythematous left TM with serous effusion.  Normal Right TM Nose: maxillary sinus tenderness on the left.  Throat: lips, mucosa, and tongue normal; teeth and gums normal Neck: cervical LADF noted, no  carotid bruit, supple, symmetrical, trachea midline and thyroid not enlarged, symmetric, no tenderness/mass/nodules Back: symmetric, no curvature. ROM normal. No CVA tenderness. Lungs: clear to auscultation bilaterally Heart: regular rate and rhythm, S1, S2 normal, no murmur, click, rub or gallop Abdomen: soft, non-tender; bowel sounds normal; no masses,  no organomegaly Pulses: 2+ and symmetric Skin: Skin color, texture, turgor normal. No rashes or lesions Lymph nodes: Cervical, supraclavicular, and  axillary nodes normal.  Assessment and Plan:  Acute URI of multiple sites With sinusitis and otitis on exam. Levaquin prescribed over-the-counter decongestants cough suppressants also prescribed. Influenza test was negative.   Updated Medication List Outpatient Encounter Prescriptions as of 07/30/2012  Medication Sig Dispense Refill  . alfuzosin (UROXATRAL) 10 MG 24 hr tablet Take 10 mg by mouth daily.        Marland Kitchen ALPRAZolam (XANAX XR) 1 MG 24 hr tablet Take 1 tablet (1 mg total) by mouth every morning.  30 tablet  5  . ALPRAZolam (XANAX) 0.5 MG tablet Take 1 tablet (0.5 mg total) by mouth at bedtime as needed.  30 tablet  3  . aspirin 81 MG EC tablet Take 81 mg by mouth daily.        Marland Kitchen atenolol (TENORMIN) 12.5 mg TABS Take 0.5 tablets (12.5 mg total) by mouth daily.  30 each  3  .  atenolol (TENORMIN) 25 MG tablet TAKE 1/2 TABLET BY MOUTH DAILY  90 tablet  2  . atorvastatin (LIPITOR) 40 MG tablet Take 1 tablet (40 mg total) by mouth daily.  90 tablet  3  . buPROPion (WELLBUTRIN SR) 150 MG 12 hr tablet Take one tablet in the am & one tablet @@ 2 pm daily.      . Calcium Carbonate-Vitamin D (CALTRATE 600+D) 600-400 MG-UNIT per tablet Take 2 tablets by mouth daily.        . clindamycin (CLEOCIN) 300 MG capsule 2 capsules one hour before dental procedure  2 capsule  1  . clopidogrel (PLAVIX) 75 MG tablet Take 1 tablet (75 mg total) by mouth daily.  90 tablet  3  . diclofenac sodium (VOLTAREN) 1 % GEL Apply 1 application topically 4 (four) times daily.  100 g  3  . lisinopril (PRINIVIL,ZESTRIL) 5 MG tablet Take 5 mg by mouth daily as needed.      . mometasone (NASONEX) 50 MCG/ACT nasal spray Place 2 sprays into the nose daily.  17 g  12  . niacin (NIASPAN) 1000 MG CR tablet Take 1 tablet (1,000 mg total) by mouth daily.  30 tablet  5  . nitroGLYCERIN (NITROSTAT) 0.4 MG SL tablet Place 1 tablet (0.4 mg total) under the tongue every 5 (five) minutes as needed.  90 tablet  3  . pantoprazole (PROTONIX) 40 MG tablet Take 40 mg by mouth daily.        Marland Kitchen PARoxetine (PAXIL-CR) 25 MG 24 hr tablet Take 1 tablet (25 mg total) by mouth every morning.  30 tablet  6  . levofloxacin (LEVAQUIN) 500 MG tablet Take 1 tablet (500 mg total) by mouth daily.  7 tablet  0     Orders Placed This Encounter  Procedures  . POCT Influenza A/B    No Follow-up on file.

## 2012-08-01 ENCOUNTER — Encounter: Payer: Self-pay | Admitting: Internal Medicine

## 2012-08-01 DIAGNOSIS — J069 Acute upper respiratory infection, unspecified: Secondary | ICD-10-CM | POA: Insufficient documentation

## 2012-08-01 NOTE — Assessment & Plan Note (Signed)
With sinusitis and otitis on exam. Levaquin prescribed over-the-counter decongestants cough suppressants also prescribed. Influenza test was negative.

## 2012-08-02 DIAGNOSIS — H1130 Conjunctival hemorrhage, unspecified eye: Secondary | ICD-10-CM | POA: Diagnosis not present

## 2012-08-13 ENCOUNTER — Ambulatory Visit (INDEPENDENT_AMBULATORY_CARE_PROVIDER_SITE_OTHER): Payer: Medicare Other | Admitting: Internal Medicine

## 2012-08-13 ENCOUNTER — Encounter: Payer: Self-pay | Admitting: Internal Medicine

## 2012-08-13 VITALS — BP 120/76 | HR 72 | Temp 98.5°F | Resp 12 | Ht 63.0 in | Wt 193.5 lb

## 2012-08-13 DIAGNOSIS — H6690 Otitis media, unspecified, unspecified ear: Secondary | ICD-10-CM | POA: Diagnosis not present

## 2012-08-13 MED ORDER — AZITHROMYCIN 500 MG PO TABS: 500.0000 mg | ORAL_TABLET | Freq: Every day | ORAL | Status: DC

## 2012-08-13 MED ORDER — PREDNISONE (PAK) 10 MG PO TABS: ORAL_TABLET | ORAL | Status: DC

## 2012-08-13 NOTE — Patient Instructions (Addendum)
Your left ear looks infected.  I am starting you on another antibiotic to take for 7 days, along with a prednisone taper for the swelling   Start the azithromycin today and the prednisone tomorrow.  We gave you a shot of Depo Medrol today.  Continue the the nasal sprays.   Meclizine to use as needed for dizziness.

## 2012-08-13 NOTE — Progress Notes (Signed)
Patient ID: Edwin Woodward, male   DOB: 1950-12-14, 61 y.o.   MRN: [id number]  Patient Active Problem List  Diagnosis  . HYPERLIPIDEMIA-MIXED  . HYPERTENSION, BENIGN  . CAD, NATIVE VESSEL  . Obesity  . OSA on CPAP  . GAD (generalized anxiety disorder)  . Thalassemia  . Tendonitis of wrist, right  . Otitis media  . Acute URI of multiple sites    Subjective:  CC:   Chief Complaint  Patient presents with  . Follow-up    HPI:   Edwin Woodward a 61 y.o. male who presents for follow up on recent treatment for acute URI with Levaquin x 7 days,  Decongestants (oral)) avoided due to cardiac history.  He has new complaint of vertigo, cheek pain and left ear pain.  Sputum and nasal discharge is green.     Past Medical History  Diagnosis Date  . CAD (coronary artery disease)   . Presence of bare metal stent in right coronary artery     proximal RCA adn LAD in April 2009  . Hyperlipidemia   . Hypertension   . Cardiovascular stress test abnormal     Jan 2010 no significant ischemia  . Trigger finger     Past Surgical History  Procedure Date  . Replacement total knee   . Coronary artery bypass graft     2 vessel  . Joint replacement     total knee  . Appendectomy   . Carpal tunnel release   . Vasectomy   . Coronary angioplasty 12/2007  . Appendectomy   . Tonsillectomy     The following portions of the patient's history were reviewed and updated as appropriate: Allergies, current medications, and problem list.    Review of Systems:   12 Pt  review of systems was negative except those addressed in the HPI,     History   Social History  . Marital Status: Married    Spouse Name: N/A    Number of Children: N/A  . Years of Education: N/A   Occupational History  . Not on file.   Social History Main Topics  . Smoking status: Current Some Day Smoker  . Smokeless tobacco: Never Used  . Alcohol Use: No  . Drug Use: Yes    Special: Marijuana  .  Sexually Active: Not on file   Other Topics Concern  . Not on file   Social History Narrative   Lives with wife.    Objective:  BP 120/76  Pulse 72  Temp 98.5 F (36.9 C) (Oral)  Resp 12  Ht 5\' 3"  (1.6 m)  Wt 193 lb 8 oz (87.771 kg)  BMI 34.28 kg/m2  SpO2 97%  General appearance: alert, cooperative and appears stated age Ears: left TM is bulging and red, Right TM is normal. normal external ear canals both ears Throat: lips, mucosa, and tongue normal; teeth and gums normal Neck: cervical adenopathy, no carotid bruit, supple, symmetrical, trachea midline and thyroid not enlarged, symmetric, no tenderness/mass/nodules Back: symmetric, no curvature. ROM normal. No CVA tenderness. Lungs: clear to auscultation bilaterally Heart: regular rate and rhythm, S1, S2 normal, no murmur, click, rub or gallop Abdomen: soft, non-tender; bowel sounds normal; no masses,  no organomegaly Pulses: 2+ and symmetric Skin: Skin color, texture, turgor normal. No rashes or lesions Lymph nodes: Cervical, supraclavicular, and axillary nodes normal.  Assessment and Plan:  Otitis media Despite 7 days of levaquin. Change to azithromycin,,  Add prednisone taper and IM dose  today, topical afrin.  meclizine for vertigo.    Updated Medication List Outpatient Encounter Prescriptions as of 08/13/2012  Medication Sig Dispense Refill  . alfuzosin (UROXATRAL) 10 MG 24 hr tablet Take 10 mg by mouth daily.        Marland Kitchen ALPRAZolam (XANAX XR) 1 MG 24 hr tablet Take 1 tablet (1 mg total) by mouth every morning.  30 tablet  5  . ALPRAZolam (XANAX) 0.5 MG tablet Take 1 tablet (0.5 mg total) by mouth at bedtime as needed.  30 tablet  3  . aspirin 81 MG EC tablet Take 81 mg by mouth daily.        Marland Kitchen atenolol (TENORMIN) 12.5 mg TABS Take 0.5 tablets (12.5 mg total) by mouth daily.  30 each  3  . atenolol (TENORMIN) 25 MG tablet TAKE 1/2 TABLET BY MOUTH DAILY  90 tablet  2  . atorvastatin (LIPITOR) 40 MG tablet Take 1 tablet  (40 mg total) by mouth daily.  90 tablet  3  . buPROPion (WELLBUTRIN SR) 150 MG 12 hr tablet Take one tablet in the am & one tablet @@ 2 pm daily.      . Calcium Carbonate-Vitamin D (CALTRATE 600+D) 600-400 MG-UNIT per tablet Take 2 tablets by mouth daily.        . clindamycin (CLEOCIN) 300 MG capsule 2 capsules one hour before dental procedure  2 capsule  1  . clopidogrel (PLAVIX) 75 MG tablet Take 1 tablet (75 mg total) by mouth daily.  90 tablet  3  . diclofenac sodium (VOLTAREN) 1 % GEL Apply 1 application topically 4 (four) times daily.  100 g  3  . levofloxacin (LEVAQUIN) 500 MG tablet Take 1 tablet (500 mg total) by mouth daily.  7 tablet  0  . lisinopril (PRINIVIL,ZESTRIL) 5 MG tablet Take 5 mg by mouth daily as needed.      . mometasone (NASONEX) 50 MCG/ACT nasal spray Place 2 sprays into the nose daily.  17 g  12  . niacin (NIASPAN) 1000 MG CR tablet Take 1 tablet (1,000 mg total) by mouth daily.  30 tablet  5  . nitroGLYCERIN (NITROSTAT) 0.4 MG SL tablet Place 1 tablet (0.4 mg total) under the tongue every 5 (five) minutes as needed.  90 tablet  3  . pantoprazole (PROTONIX) 40 MG tablet Take 40 mg by mouth daily.        Marland Kitchen PARoxetine (PAXIL-CR) 25 MG 24 hr tablet Take 1 tablet (25 mg total) by mouth every morning.  30 tablet  6  . azithromycin (ZITHROMAX) 500 MG tablet Take 1 tablet (500 mg total) by mouth daily.  7 tablet  0  . predniSONE (STERAPRED UNI-PAK) 10 MG tablet 6 tablets on day 1, decrease by tablet daily until gone  21 tablet  0     No orders of the defined types were placed in this encounter.    No Follow-up on file.

## 2012-08-15 ENCOUNTER — Encounter: Payer: Self-pay | Admitting: Internal Medicine

## 2012-08-15 NOTE — Assessment & Plan Note (Signed)
Despite 7 days of levaquin. Change to azithromycin,,  Add prednisone taper and IM dose today, topical afrin.  meclizine for vertigo.

## 2012-08-24 DIAGNOSIS — F321 Major depressive disorder, single episode, moderate: Secondary | ICD-10-CM | POA: Diagnosis not present

## 2012-08-24 DIAGNOSIS — F411 Generalized anxiety disorder: Secondary | ICD-10-CM | POA: Diagnosis not present

## 2012-08-24 DIAGNOSIS — F331 Major depressive disorder, recurrent, moderate: Secondary | ICD-10-CM | POA: Diagnosis not present

## 2012-09-02 ENCOUNTER — Telehealth: Payer: Self-pay | Admitting: Internal Medicine

## 2012-09-02 DIAGNOSIS — H6690 Otitis media, unspecified, unspecified ear: Secondary | ICD-10-CM

## 2012-09-02 NOTE — Telephone Encounter (Signed)
without more information I will have to assume what he is asking for is a referral to Dr. Jearld Fenton for unresolved otitis media given his office visit on Dec 6th .  Edwin Woodward, in the future can you get a little more information (what is the reason for the referral?  ) it would save me about 10 minutes of  time.  Thanks

## 2012-09-02 NOTE — Telephone Encounter (Signed)
Pt is saying that ENT office is saying that they need a note from Dr. Darrick Huntsman saying that he needs to follow up with Dr. Jearld Fenton.  Pt goes to [facility] he sees Dr. Jearld Fenton.

## 2012-09-10 DIAGNOSIS — F331 Major depressive disorder, recurrent, moderate: Secondary | ICD-10-CM | POA: Diagnosis not present

## 2012-09-10 DIAGNOSIS — N430 Encysted hydrocele: Secondary | ICD-10-CM | POA: Diagnosis not present

## 2012-09-10 DIAGNOSIS — K4090 Unilateral inguinal hernia, without obstruction or gangrene, not specified as recurrent: Secondary | ICD-10-CM | POA: Diagnosis not present

## 2012-09-10 DIAGNOSIS — N401 Enlarged prostate with lower urinary tract symptoms: Secondary | ICD-10-CM | POA: Diagnosis not present

## 2012-09-10 DIAGNOSIS — R339 Retention of urine, unspecified: Secondary | ICD-10-CM | POA: Diagnosis not present

## 2012-09-17 DIAGNOSIS — J019 Acute sinusitis, unspecified: Secondary | ICD-10-CM | POA: Diagnosis not present

## 2012-09-17 DIAGNOSIS — H6500 Acute serous otitis media, unspecified ear: Secondary | ICD-10-CM | POA: Diagnosis not present

## 2012-09-17 DIAGNOSIS — H905 Unspecified sensorineural hearing loss: Secondary | ICD-10-CM | POA: Diagnosis not present

## 2012-09-21 DIAGNOSIS — F331 Major depressive disorder, recurrent, moderate: Secondary | ICD-10-CM | POA: Diagnosis not present

## 2012-09-28 DIAGNOSIS — F331 Major depressive disorder, recurrent, moderate: Secondary | ICD-10-CM | POA: Diagnosis not present

## 2012-10-05 DIAGNOSIS — F331 Major depressive disorder, recurrent, moderate: Secondary | ICD-10-CM | POA: Diagnosis not present

## 2012-10-11 ENCOUNTER — Other Ambulatory Visit: Payer: Self-pay

## 2012-10-11 DIAGNOSIS — I2510 Atherosclerotic heart disease of native coronary artery without angina pectoris: Secondary | ICD-10-CM

## 2012-10-11 DIAGNOSIS — E785 Hyperlipidemia, unspecified: Secondary | ICD-10-CM

## 2012-10-12 ENCOUNTER — Ambulatory Visit (INDEPENDENT_AMBULATORY_CARE_PROVIDER_SITE_OTHER): Payer: Medicare Other

## 2012-10-12 ENCOUNTER — Ambulatory Visit: Payer: Medicare Other | Admitting: Cardiovascular Disease

## 2012-10-12 DIAGNOSIS — I2510 Atherosclerotic heart disease of native coronary artery without angina pectoris: Secondary | ICD-10-CM | POA: Diagnosis not present

## 2012-10-12 DIAGNOSIS — E785 Hyperlipidemia, unspecified: Secondary | ICD-10-CM | POA: Diagnosis not present

## 2012-10-13 ENCOUNTER — Telehealth: Payer: Self-pay | Admitting: Internal Medicine

## 2012-10-13 DIAGNOSIS — M778 Other enthesopathies, not elsewhere classified: Secondary | ICD-10-CM

## 2012-10-13 LAB — LIPID PANEL
Chol/HDL Ratio: 3.4 ratio (ref 0.0–5.0)
Cholesterol, Total: 124 mg/dL (ref 100–199)
HDL: 37 mg/dL — ABNORMAL LOW (ref >39)
LDL Calculated: 73 mg/dL (ref 0–99)
Triglycerides: 68 mg/dL (ref 0–149)
VLDL Cholesterol Cal: 14 mg/dL (ref 5–40)

## 2012-10-13 LAB — HEPATIC FUNCTION PANEL
ALT: 29 IU/L (ref 0–44)
AST: 35 IU/L (ref 0–40)
Bilirubin, Direct: 0.17 mg/dL (ref 0.00–0.40)

## 2012-10-13 NOTE — Telephone Encounter (Signed)
Pt found hand Dr. In [facility] and they are saying that we need to put in referral for this. Pt says to please contact him with any questions.

## 2012-10-13 NOTE — Telephone Encounter (Signed)
Called pt back and was advised that pt found a therapist in [facility] called [facility]. Pt wants to know if pt needs to be seen again since you evaluated him for this condition last spring or could we place a referral for this? Pt  Had been seeing an independent neuomuscular therapist called Harriett Sine yow. Pt cannot afford to continue this treatment.

## 2012-10-15 NOTE — Telephone Encounter (Signed)
Referral to hand therapy in process.

## 2012-10-15 NOTE — Assessment & Plan Note (Signed)
Referral  to [facility] in [facility] requested.

## 2012-10-16 NOTE — Telephone Encounter (Signed)
patient states that does not need an appt made with [facility] bc he has  one on monday

## 2012-10-16 NOTE — Telephone Encounter (Signed)
Pt notified. Pt has a consult with [facility] on Monday stated he can do this without the referral being completed.

## 2012-10-18 DIAGNOSIS — M25549 Pain in joints of unspecified hand: Secondary | ICD-10-CM | POA: Diagnosis not present

## 2012-10-19 ENCOUNTER — Encounter: Payer: Self-pay | Admitting: Cardiovascular Disease

## 2012-10-19 ENCOUNTER — Ambulatory Visit (INDEPENDENT_AMBULATORY_CARE_PROVIDER_SITE_OTHER): Payer: Medicare Other | Admitting: Cardiovascular Disease

## 2012-10-19 VITALS — BP 120/70 | HR 74 | Ht 64.0 in | Wt 197.2 lb

## 2012-10-19 DIAGNOSIS — E785 Hyperlipidemia, unspecified: Secondary | ICD-10-CM | POA: Diagnosis not present

## 2012-10-19 DIAGNOSIS — I10 Essential (primary) hypertension: Secondary | ICD-10-CM | POA: Diagnosis not present

## 2012-10-19 DIAGNOSIS — I2510 Atherosclerotic heart disease of native coronary artery without angina pectoris: Secondary | ICD-10-CM

## 2012-10-19 DIAGNOSIS — E669 Obesity, unspecified: Secondary | ICD-10-CM | POA: Diagnosis not present

## 2012-10-19 NOTE — Assessment & Plan Note (Signed)
Currently with no symptoms of angina. No further workup at this time. Continue current medication regimen. 

## 2012-10-19 NOTE — Assessment & Plan Note (Signed)
We spent most of his visit discussing his weight which has jumped exponentially. He is living alone, eating the wrong foods. He we'll try to eat better, start increasing his exercise.

## 2012-10-19 NOTE — Assessment & Plan Note (Signed)
Cholesterol is at goal on the current lipid regimen. No changes to the medications were made.  

## 2012-10-19 NOTE — Patient Instructions (Addendum)
You are doing well. No medication changes were made.  Please call us if you have new issues that need to be addressed before your next appt.  Your physician wants you to follow-up in: 6 months.  You will receive a reminder letter in the mail two months in advance. If you don't receive a letter, please call our office to schedule the follow-up appointment.   

## 2012-10-19 NOTE — Progress Notes (Signed)
Patient ID: REDMOND WHITTLEY, male    DOB: [birth year], 62 y.o.   MRN: [id number]  HPI Comments: Mr Lunt is a very pleasant 62 year old gentleman who has a history of coronary artery disease, bare-metal stents placed to his proximal RCA and LAD in April 2009, history of total knee replacements bilaterally, hypertension, hyperlipidemia, anxiety and ADHD who presents for routine followup.   Overall he is doing well. He has gained over 20 pounds since his last clinic visit in the middle of 2013. He was separated from his wife, eating a poor diet. His ex-wife reports that he is eating the wrong foods, eating late at night, lots of pizza. She still takes care of his medications and bills. He continues to play with his drums though only short periods of time.  He has no new complaints. No significant chest pain or shortness of breath.  Most recent cholesterol is in the 120 range up from the 90 range  EKG shows normal sinus rhythm with rate 74 beats per minute, right bundle branch block, no significant ST or T wave changes, LAFB        Outpatient Encounter Prescriptions as of 10/19/2012  Medication Sig Dispense Refill  . alfuzosin (UROXATRAL) 10 MG 24 hr tablet Take 10 mg by mouth daily.        Marland Kitchen ALPRAZolam (XANAX XR) 1 MG 24 hr tablet Take 1 tablet (1 mg total) by mouth every morning.  30 tablet  5  . aspirin 81 MG EC tablet Take 81 mg by mouth daily.        Marland Kitchen atenolol (TENORMIN) 25 MG tablet TAKE 1/2 TABLET BY MOUTH DAILY  90 tablet  2  . atorvastatin (LIPITOR) 40 MG tablet 20 mg every other day.      Marland Kitchen buPROPion (WELLBUTRIN SR) 150 MG 12 hr tablet Take one tablet in the am & one tablet @@ 2 pm daily.      . Calcium Carbonate-Vitamin D (CALTRATE 600+D) 600-400 MG-UNIT per tablet Take 2 tablets by mouth daily.        . clindamycin (CLEOCIN) 300 MG capsule as needed. 2 capsules one hour before dental procedure      . clopidogrel (PLAVIX) 75 MG tablet Take 1 tablet (75 mg total) by mouth daily.   90 tablet  3  . diclofenac sodium (VOLTAREN) 1 % GEL Apply 1 application topically as needed.      Marland Kitchen guaiFENesin (MUCINEX) 600 MG 12 hr tablet Take 1,200 mg by mouth as needed for congestion.      Marland Kitchen ibuprofen (ADVIL) 200 MG tablet Take 200 mg by mouth every 6 (six) hours as needed for pain.      Marland Kitchen lisinopril (PRINIVIL,ZESTRIL) 5 MG tablet Take 5 mg by mouth daily as needed.      . loratadine (CLARITIN) 10 MG tablet Take 10 mg by mouth as needed for allergies.      . mometasone (NASONEX) 50 MCG/ACT nasal spray Place 2 sprays into the nose as needed.      . naproxen (NAPROSYN) 500 MG tablet Take 500 mg by mouth as needed.      . naproxen sodium (ANAPROX) 220 MG tablet Take 220 mg by mouth as needed.      . nitroGLYCERIN (NITROSTAT) 0.4 MG SL tablet Place 1 tablet (0.4 mg total) under the tongue every 5 (five) minutes as needed.  90 tablet  3  . pantoprazole (PROTONIX) 40 MG tablet Take 40 mg by mouth daily.        Marland Kitchen  PARoxetine (PAXIL-CR) 25 MG 24 hr tablet Take 1 tablet (25 mg total) by mouth every morning.  30 tablet  6  . QUEtiapine (SEROQUEL) 25 MG tablet Take 50 mg by mouth at bedtime.       No facility-administered encounter medications on file as of 10/19/2012.      Review of Systems  Constitutional: Positive for appetite change and unexpected weight change.  HENT: Negative.   Eyes: Negative.   Respiratory: Negative.   Cardiovascular: Negative.   Gastrointestinal: Negative.   Musculoskeletal: Negative.        Trigger finger  Skin: Negative.   Neurological: Negative.   Psychiatric/Behavioral: The patient is nervous/anxious.   All other systems reviewed and are negative.    BP 120/70  Pulse 74  Ht 5\' 4"  (1.626 m)  Wt 197 lb 4 oz (89.472 kg)  BMI 33.84 kg/m2  Physical Exam  Nursing note and vitals reviewed. Constitutional: He is oriented to person, place, and time. He appears well-developed and well-nourished.  Mild obesity  HENT:  Head: Normocephalic.  Nose: Nose  normal.  Mouth/Throat: Oropharynx is clear and moist.  Eyes: Conjunctivae are normal. Pupils are equal, round, and reactive to light.  Neck: Normal range of motion. Neck supple. No JVD present.  Cardiovascular: Normal rate, regular rhythm, S1 normal, S2 normal, normal heart sounds and intact distal pulses.  Exam reveals no gallop and no friction rub.   No murmur heard. Pulmonary/Chest: Effort normal and breath sounds normal. No respiratory distress. He has no wheezes. He has no rales. He exhibits no tenderness.  Abdominal: Soft. Bowel sounds are normal. He exhibits no distension. There is no tenderness.  Musculoskeletal: Normal range of motion. He exhibits no edema and no tenderness.  Lymphadenopathy:    He has no cervical adenopathy.  Neurological: He is alert and oriented to person, place, and time. Coordination normal.  Skin: Skin is warm and dry. No rash noted. No erythema.  Psychiatric: He has a normal mood and affect. His behavior is normal. Judgment and thought content normal.      Assessment and Plan

## 2012-10-19 NOTE — Assessment & Plan Note (Signed)
Blood pressure for the most part is well controlled. When he is stressed, blood pressure does go up and he takes lisinopril 5 mg as needed for his headaches and for high blood pressure. This seems to work for him.

## 2012-10-25 DIAGNOSIS — M25549 Pain in joints of unspecified hand: Secondary | ICD-10-CM | POA: Diagnosis not present

## 2012-10-27 DIAGNOSIS — F331 Major depressive disorder, recurrent, moderate: Secondary | ICD-10-CM | POA: Diagnosis not present

## 2012-11-01 DIAGNOSIS — M25549 Pain in joints of unspecified hand: Secondary | ICD-10-CM | POA: Diagnosis not present

## 2012-11-03 DIAGNOSIS — F331 Major depressive disorder, recurrent, moderate: Secondary | ICD-10-CM | POA: Diagnosis not present

## 2012-11-10 DIAGNOSIS — F331 Major depressive disorder, recurrent, moderate: Secondary | ICD-10-CM | POA: Diagnosis not present

## 2012-11-15 DIAGNOSIS — M25549 Pain in joints of unspecified hand: Secondary | ICD-10-CM | POA: Diagnosis not present

## 2012-11-17 DIAGNOSIS — F331 Major depressive disorder, recurrent, moderate: Secondary | ICD-10-CM | POA: Diagnosis not present

## 2012-11-19 ENCOUNTER — Telehealth: Payer: Self-pay | Admitting: General Practice

## 2012-11-19 DIAGNOSIS — M778 Other enthesopathies, not elsewhere classified: Secondary | ICD-10-CM

## 2012-11-19 NOTE — Telephone Encounter (Signed)
Pt wife called today and stated that her husband does indeed still need the referral to Hand therapy please see previous phone note. States she misspoke when she advised me last month.

## 2012-11-21 NOTE — Addendum Note (Signed)
Addended by: Sherlene Shams on: 11/21/2012 04:50 PM   Modules accepted: Orders

## 2012-11-21 NOTE — Telephone Encounter (Signed)
Amber ,  the previous referral that was cancelled  Per patiet's wifee needs to be reinstated ,  Please see preio rreferal for specific group he is requesting referral to.  thanks

## 2012-11-22 DIAGNOSIS — M25549 Pain in joints of unspecified hand: Secondary | ICD-10-CM | POA: Diagnosis not present

## 2012-11-23 DIAGNOSIS — K219 Gastro-esophageal reflux disease without esophagitis: Secondary | ICD-10-CM | POA: Diagnosis not present

## 2012-11-24 DIAGNOSIS — F331 Major depressive disorder, recurrent, moderate: Secondary | ICD-10-CM | POA: Diagnosis not present

## 2012-11-29 DIAGNOSIS — M25549 Pain in joints of unspecified hand: Secondary | ICD-10-CM | POA: Diagnosis not present

## 2012-12-01 DIAGNOSIS — F331 Major depressive disorder, recurrent, moderate: Secondary | ICD-10-CM | POA: Diagnosis not present

## 2012-12-08 DIAGNOSIS — F331 Major depressive disorder, recurrent, moderate: Secondary | ICD-10-CM | POA: Diagnosis not present

## 2012-12-14 ENCOUNTER — Ambulatory Visit (INDEPENDENT_AMBULATORY_CARE_PROVIDER_SITE_OTHER): Payer: Medicare Other | Admitting: Adult Health

## 2012-12-14 ENCOUNTER — Encounter: Payer: Self-pay | Admitting: Adult Health

## 2012-12-14 VITALS — BP 124/70 | HR 70 | Temp 98.0°F | Resp 18 | Wt 198.5 lb

## 2012-12-14 DIAGNOSIS — J329 Chronic sinusitis, unspecified: Secondary | ICD-10-CM | POA: Diagnosis not present

## 2012-12-14 MED ORDER — DOXYCYCLINE HYCLATE 100 MG PO TABS: 100.0000 mg | ORAL_TABLET | Freq: Two times a day (BID) | ORAL | Status: DC

## 2012-12-14 NOTE — Assessment & Plan Note (Signed)
Start doxycycline 100 mg bid x 10 days. Start using your Nasonex nasal spray daily. Continue using saline spray as needed. RTC if no improvement in symptoms within 3-4 days.

## 2012-12-14 NOTE — Progress Notes (Signed)
Subjective:    Patient ID: Edwin Woodward, male    DOB: 1951-07-10, 62 y.o.   MRN: [id number]  HPI  Patient is a 62 y/o male who presents to clinic with c/o sinus pressure and pressure within ears. He has also been experiencing some episodes of lightheadedness with his sinus symptoms. Patient reports having a bout of diarrhea approximately 2 weeks ago which lasted 2 days. This has subsided. He denies fever, chills, shortness of breath.  Current Outpatient Prescriptions on File Prior to Visit  Medication Sig Dispense Refill  . alfuzosin (UROXATRAL) 10 MG 24 hr tablet Take 10 mg by mouth daily.        Marland Kitchen ALPRAZolam (XANAX XR) 1 MG 24 hr tablet Take 1 tablet (1 mg total) by mouth every morning.  30 tablet  5  . aspirin 81 MG EC tablet Take 81 mg by mouth daily.        Marland Kitchen atenolol (TENORMIN) 25 MG tablet TAKE 1/2 TABLET BY MOUTH DAILY  90 tablet  2  . atorvastatin (LIPITOR) 40 MG tablet 20 mg every other day.      Marland Kitchen buPROPion (WELLBUTRIN SR) 150 MG 12 hr tablet Take one tablet in the am & one tablet @@ 2 pm daily.      . Calcium Carbonate-Vitamin D (CALTRATE 600+D) 600-400 MG-UNIT per tablet Take 2 tablets by mouth daily.        . clopidogrel (PLAVIX) 75 MG tablet Take 1 tablet (75 mg total) by mouth daily.  90 tablet  3  . diclofenac sodium (VOLTAREN) 1 % GEL Apply 1 application topically as needed.      Marland Kitchen guaiFENesin (MUCINEX) 600 MG 12 hr tablet Take 1,200 mg by mouth as needed for congestion.      Marland Kitchen ibuprofen (ADVIL) 200 MG tablet Take 200 mg by mouth every 6 (six) hours as needed for pain.      Marland Kitchen lisinopril (PRINIVIL,ZESTRIL) 5 MG tablet Take 5 mg by mouth daily as needed.      . loratadine (CLARITIN) 10 MG tablet Take 10 mg by mouth as needed for allergies.      . naproxen (NAPROSYN) 500 MG tablet Take 500 mg by mouth as needed.      . naproxen sodium (ANAPROX) 220 MG tablet Take 220 mg by mouth as needed.      . nitroGLYCERIN (NITROSTAT) 0.4 MG SL tablet Place 1 tablet (0.4 mg total)  under the tongue every 5 (five) minutes as needed.  90 tablet  3  . pantoprazole (PROTONIX) 40 MG tablet Take 40 mg by mouth daily.        Marland Kitchen PARoxetine (PAXIL-CR) 25 MG 24 hr tablet Take 1 tablet (25 mg total) by mouth every morning.  30 tablet  6  . QUEtiapine (SEROQUEL) 25 MG tablet Take 50 mg by mouth at bedtime.      . mometasone (NASONEX) 50 MCG/ACT nasal spray Place 2 sprays into the nose as needed.       No current facility-administered medications on file prior to visit.     Review of Systems  Constitutional: Positive for fatigue. Negative for chills.  HENT: Positive for ear pain, congestion, postnasal drip and sinus pressure.   Respiratory: Positive for cough.   Gastrointestinal: Positive for diarrhea.       Diarrhea approximately 2 weeks ago which lasted 2 days.   Neurological: Positive for headaches.   BP 124/70  Pulse 70  Temp(Src) 98 F (36.7 C) (Oral)  Resp 18  Wt 198 lb 8 oz (90.039 kg)  BMI 34.06 kg/m2  SpO2 94%     Objective:   Physical Exam  Constitutional: He is oriented to person, place, and time. He appears well-developed and well-nourished. No distress.  HENT:  Right Ear: External ear normal.  Left Ear: External ear normal.  Erythema posterior pharynx with evidence of drainage  Cardiovascular: Normal rate and regular rhythm.   Pulmonary/Chest: Effort normal and breath sounds normal. No respiratory distress. He has no wheezes. He has no rales.  Lymphadenopathy:    He has no cervical adenopathy.  Neurological: He is alert and oriented to person, place, and time.  Psychiatric: He has a normal mood and affect. His behavior is normal. Judgment and thought content normal.  Appears nervous and anxious          Assessment & Plan:

## 2012-12-14 NOTE — Patient Instructions (Addendum)
Start your antibiotic today. You will take this twice daily for 10 days.  Use your Nasonex daily and your saline nasal spray.   What is sinusitis? - Sinusitis is a condition that can cause a stuffy nose, pain in the face, and yellow or green discharge (mucus) from the nose. The sinuses are hollow areas in the bones of the face. They have a thin lining that normally makes a small amount of mucus. When this lining gets infected, it swells and makes extra mucus. This causes symptoms.   Sinusitis can occur when a person gets sick with a cold. The germs causing the cold can also infect the sinuses. Many times, a person feels like his or her cold is getting better. But then he or she gets sinusitis and begins to feel sick again.  What are the symptoms of sinusitis? - Common symptoms of sinusitis include: Stuffy or blocked nose  Thick yellow or green discharge from the nose  Pain in the teeth  Pain or pressure in the face - This often feels worse when a person bends forward.   People with sinusitis can also have other symptoms that include: Fever  Cough  Trouble smelling  Ear pressure or fullness  Headache  Bad breath  Feeling tired   Most of the time, symptoms start to improve in 7 to 10 days.  Should I see a doctor or nurse? - See your doctor or nurse if your symptoms last more than 7 days, or if your symptoms get better at first but then get worse. Sometimes, sinusitis can lead to serious problems. See your doctor or nurse right away (do not wait 7 days) if you have: Fever higher than 102.39F (39.2C)  Sudden and severe pain in the face and head  Trouble seeing or seeing double  Trouble thinking clearly  Swelling or redness around 1 or both eyes  Trouble breathing or a stiff neck   Is there anything I can do on my own to feel better? - Yes. To reduce your symptoms, you can: Take an over-the-counter pain reliever to reduce the pain  Rinse your nose and sinuses with salt water a  few times a day - Ask your doctor or nurse about the best way to do this.  Use a decongestant nose spray - These sprays are sold in a pharmacy. But do not use decongestant nose sprays for more than 2 to 3 days in a row. Using them more than 3 days in a row can make symptoms worse.   You should NOT take an antihistamine for sinusitis. Common antihistamines include diphenhydramine (sample brand name: Benadryl), chlorpheniramine (sample brand name: Chlor-Trimeton), loratadine (sample brand name: Claritin), and cetirizine (sample brand name: Zyrtec). They can treat allergies, but not sinus infections, and could increase your discomfort by drying the lining of your nose and sinuses, or making you tired.   Your doctor might also prescribe a steroid nose spray to reduce the swelling in your nose. (Steroid nose sprays do not contain the same steroids that athletes take to build muscle.)  How is sinusitis treated? - Most of the time, sinusitis does not need to be treated with antibiotic medicines. This is because most sinusitis is caused by viruses - not bacteria - and antibiotics do not kill viruses. Many people get over sinus infections without antibiotics.  Some people with sinusitis do need treatment with antibiotics. If your symptoms have not improved after 7 to 10 days, ask your doctor if you should  take antibiotics. Your doctor might recommend that you wait 1 more week to see if your symptoms improve. But if you have symptoms such as a fever or a lot of pain, he or she might prescribe antibiotics. It is important to follow your doctor's instructions about taking your antibiotics.  What if my symptoms do not get better? - If your symptoms do not get better, talk with your doctor or nurse. He or she might order tests to figure out why you still have symptoms. These can include:  CT scan or other imaging tests - Imaging tests create pictures of the inside of the body.  A test to look inside the sinuses -  For this test, a doctor puts a thin tube with a camera on the end into the nose and up into the sinuses.   Some people get a lot of sinus infections or have symptoms that last at least 3 months. These people can have a different type of sinusitis called "chronic sinusitis." Chronic sinusitis can be caused by different things. For example, some people have growths in their nose or sinuses that are called "polyps." Other people have allergies that cause their symptoms.  Chronic sinusitis can be treated in different ways. If you have chronic sinusitis, talk with your doctor about which treatments are right for you.

## 2012-12-15 DIAGNOSIS — F331 Major depressive disorder, recurrent, moderate: Secondary | ICD-10-CM | POA: Diagnosis not present

## 2012-12-20 DIAGNOSIS — M25549 Pain in joints of unspecified hand: Secondary | ICD-10-CM | POA: Diagnosis not present

## 2012-12-22 DIAGNOSIS — F331 Major depressive disorder, recurrent, moderate: Secondary | ICD-10-CM | POA: Diagnosis not present

## 2012-12-29 DIAGNOSIS — F331 Major depressive disorder, recurrent, moderate: Secondary | ICD-10-CM | POA: Diagnosis not present

## 2013-01-03 DIAGNOSIS — M25549 Pain in joints of unspecified hand: Secondary | ICD-10-CM | POA: Diagnosis not present

## 2013-01-05 DIAGNOSIS — H2510 Age-related nuclear cataract, unspecified eye: Secondary | ICD-10-CM | POA: Diagnosis not present

## 2013-01-05 DIAGNOSIS — F331 Major depressive disorder, recurrent, moderate: Secondary | ICD-10-CM | POA: Diagnosis not present

## 2013-01-12 DIAGNOSIS — F331 Major depressive disorder, recurrent, moderate: Secondary | ICD-10-CM | POA: Diagnosis not present

## 2013-01-17 DIAGNOSIS — M25549 Pain in joints of unspecified hand: Secondary | ICD-10-CM | POA: Diagnosis not present

## 2013-01-19 DIAGNOSIS — F331 Major depressive disorder, recurrent, moderate: Secondary | ICD-10-CM | POA: Diagnosis not present

## 2013-01-24 DIAGNOSIS — M25549 Pain in joints of unspecified hand: Secondary | ICD-10-CM | POA: Diagnosis not present

## 2013-01-25 DIAGNOSIS — F321 Major depressive disorder, single episode, moderate: Secondary | ICD-10-CM | POA: Diagnosis not present

## 2013-01-25 DIAGNOSIS — F411 Generalized anxiety disorder: Secondary | ICD-10-CM | POA: Diagnosis not present

## 2013-01-26 DIAGNOSIS — F331 Major depressive disorder, recurrent, moderate: Secondary | ICD-10-CM | POA: Diagnosis not present

## 2013-02-01 DIAGNOSIS — M25549 Pain in joints of unspecified hand: Secondary | ICD-10-CM | POA: Diagnosis not present

## 2013-02-02 DIAGNOSIS — F331 Major depressive disorder, recurrent, moderate: Secondary | ICD-10-CM | POA: Diagnosis not present

## 2013-02-07 DIAGNOSIS — M25549 Pain in joints of unspecified hand: Secondary | ICD-10-CM | POA: Diagnosis not present

## 2013-02-10 ENCOUNTER — Other Ambulatory Visit: Payer: Self-pay | Admitting: Cardiovascular Disease

## 2013-02-10 NOTE — Telephone Encounter (Signed)
Refilled Plavix sent to [facility]. #[phone] R#3

## 2013-02-14 DIAGNOSIS — M25549 Pain in joints of unspecified hand: Secondary | ICD-10-CM | POA: Diagnosis not present

## 2013-02-23 DIAGNOSIS — F331 Major depressive disorder, recurrent, moderate: Secondary | ICD-10-CM | POA: Diagnosis not present

## 2013-02-28 DIAGNOSIS — M25549 Pain in joints of unspecified hand: Secondary | ICD-10-CM | POA: Diagnosis not present

## 2013-03-02 DIAGNOSIS — F331 Major depressive disorder, recurrent, moderate: Secondary | ICD-10-CM | POA: Diagnosis not present

## 2013-03-07 DIAGNOSIS — M25549 Pain in joints of unspecified hand: Secondary | ICD-10-CM | POA: Diagnosis not present

## 2013-03-14 DIAGNOSIS — M25549 Pain in joints of unspecified hand: Secondary | ICD-10-CM | POA: Diagnosis not present

## 2013-03-16 DIAGNOSIS — F331 Major depressive disorder, recurrent, moderate: Secondary | ICD-10-CM | POA: Diagnosis not present

## 2013-03-23 DIAGNOSIS — N430 Encysted hydrocele: Secondary | ICD-10-CM | POA: Diagnosis not present

## 2013-03-23 DIAGNOSIS — N401 Enlarged prostate with lower urinary tract symptoms: Secondary | ICD-10-CM | POA: Diagnosis not present

## 2013-03-23 DIAGNOSIS — F331 Major depressive disorder, recurrent, moderate: Secondary | ICD-10-CM | POA: Diagnosis not present

## 2013-03-23 DIAGNOSIS — E291 Testicular hypofunction: Secondary | ICD-10-CM | POA: Diagnosis not present

## 2013-03-23 DIAGNOSIS — N139 Obstructive and reflux uropathy, unspecified: Secondary | ICD-10-CM | POA: Diagnosis not present

## 2013-03-23 DIAGNOSIS — N411 Chronic prostatitis: Secondary | ICD-10-CM | POA: Diagnosis not present

## 2013-03-28 DIAGNOSIS — M25549 Pain in joints of unspecified hand: Secondary | ICD-10-CM | POA: Diagnosis not present

## 2013-03-30 DIAGNOSIS — F331 Major depressive disorder, recurrent, moderate: Secondary | ICD-10-CM | POA: Diagnosis not present

## 2013-04-06 DIAGNOSIS — F331 Major depressive disorder, recurrent, moderate: Secondary | ICD-10-CM | POA: Diagnosis not present

## 2013-04-11 DIAGNOSIS — M25549 Pain in joints of unspecified hand: Secondary | ICD-10-CM | POA: Diagnosis not present

## 2013-04-13 DIAGNOSIS — F331 Major depressive disorder, recurrent, moderate: Secondary | ICD-10-CM | POA: Diagnosis not present

## 2013-04-25 DIAGNOSIS — M25549 Pain in joints of unspecified hand: Secondary | ICD-10-CM | POA: Diagnosis not present

## 2013-04-27 DIAGNOSIS — F331 Major depressive disorder, recurrent, moderate: Secondary | ICD-10-CM | POA: Diagnosis not present

## 2013-05-04 DIAGNOSIS — F331 Major depressive disorder, recurrent, moderate: Secondary | ICD-10-CM | POA: Diagnosis not present

## 2013-05-05 ENCOUNTER — Other Ambulatory Visit: Payer: Self-pay | Admitting: Internal Medicine

## 2013-05-11 DIAGNOSIS — F331 Major depressive disorder, recurrent, moderate: Secondary | ICD-10-CM | POA: Diagnosis not present

## 2013-05-11 NOTE — Telephone Encounter (Signed)
Refill sent patient to see dentist this week.

## 2013-05-16 DIAGNOSIS — M25549 Pain in joints of unspecified hand: Secondary | ICD-10-CM | POA: Diagnosis not present

## 2013-05-18 DIAGNOSIS — F331 Major depressive disorder, recurrent, moderate: Secondary | ICD-10-CM | POA: Diagnosis not present

## 2013-05-19 ENCOUNTER — Other Ambulatory Visit: Payer: Self-pay | Admitting: Cardiovascular Disease

## 2013-05-24 ENCOUNTER — Telehealth: Payer: Self-pay | Admitting: Internal Medicine

## 2013-05-24 MED ORDER — PANTOPRAZOLE SODIUM 40 MG PO TBEC: 40.0000 mg | DELAYED_RELEASE_TABLET | Freq: Every day | ORAL | Status: DC

## 2013-05-24 NOTE — Telephone Encounter (Signed)
rx for protonix sent to [facility].  What is the reason for the referral?  Anything going on right now?  i can refill the protonix indefinitely

## 2013-05-24 NOTE — Telephone Encounter (Signed)
Needs medication prescribed by [facility], which is now closed.  Medication:  Protonix generic = pantoprazole soddr 40 mg tab.   Pt now has no gastroenterologist.  Dr. Darrick Huntsman told them if they were in a bind to just let her know because the pt cannot just stop taking his meds.  Said Dr. Darrick Huntsman told them she would check with their pharmacy to confirm his medication and that she would continue to prescribe it until they could find a new provider.  Pt will now need to be referred to new gastroenterologist.  States pt only has #1 pill left. Pt states they tried to contact [facility] for the rx but they have shut the building down.  Pharmacy:  [facility].

## 2013-05-25 DIAGNOSIS — F331 Major depressive disorder, recurrent, moderate: Secondary | ICD-10-CM | POA: Diagnosis not present

## 2013-05-25 NOTE — Telephone Encounter (Signed)
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 Just assumed he needed a GI doctor to prescribe. FYI

## 2013-06-01 DIAGNOSIS — F331 Major depressive disorder, recurrent, moderate: Secondary | ICD-10-CM | POA: Diagnosis not present

## 2013-06-14 ENCOUNTER — Ambulatory Visit (INDEPENDENT_AMBULATORY_CARE_PROVIDER_SITE_OTHER): Payer: Medicare Other

## 2013-06-14 DIAGNOSIS — Z23 Encounter for immunization: Secondary | ICD-10-CM | POA: Diagnosis not present

## 2013-06-22 DIAGNOSIS — F331 Major depressive disorder, recurrent, moderate: Secondary | ICD-10-CM | POA: Diagnosis not present

## 2013-07-05 DIAGNOSIS — N139 Obstructive and reflux uropathy, unspecified: Secondary | ICD-10-CM | POA: Diagnosis not present

## 2013-07-05 DIAGNOSIS — R972 Elevated prostate specific antigen [PSA]: Secondary | ICD-10-CM | POA: Diagnosis not present

## 2013-07-05 DIAGNOSIS — N4340 Spermatocele of epididymis, unspecified: Secondary | ICD-10-CM | POA: Diagnosis not present

## 2013-07-05 DIAGNOSIS — F331 Major depressive disorder, recurrent, moderate: Secondary | ICD-10-CM | POA: Diagnosis not present

## 2013-07-05 DIAGNOSIS — N430 Encysted hydrocele: Secondary | ICD-10-CM | POA: Diagnosis not present

## 2013-07-05 DIAGNOSIS — N401 Enlarged prostate with lower urinary tract symptoms: Secondary | ICD-10-CM | POA: Diagnosis not present

## 2013-07-12 DIAGNOSIS — D239 Other benign neoplasm of skin, unspecified: Secondary | ICD-10-CM | POA: Diagnosis not present

## 2013-07-12 DIAGNOSIS — L821 Other seborrheic keratosis: Secondary | ICD-10-CM | POA: Diagnosis not present

## 2013-07-12 DIAGNOSIS — D1800 Hemangioma unspecified site: Secondary | ICD-10-CM | POA: Diagnosis not present

## 2013-07-13 DIAGNOSIS — F331 Major depressive disorder, recurrent, moderate: Secondary | ICD-10-CM | POA: Diagnosis not present

## 2013-07-20 DIAGNOSIS — F331 Major depressive disorder, recurrent, moderate: Secondary | ICD-10-CM | POA: Diagnosis not present

## 2013-07-27 DIAGNOSIS — F331 Major depressive disorder, recurrent, moderate: Secondary | ICD-10-CM | POA: Diagnosis not present

## 2013-08-17 DIAGNOSIS — F331 Major depressive disorder, recurrent, moderate: Secondary | ICD-10-CM | POA: Diagnosis not present

## 2013-08-25 DIAGNOSIS — F331 Major depressive disorder, recurrent, moderate: Secondary | ICD-10-CM | POA: Diagnosis not present

## 2013-08-29 ENCOUNTER — Encounter: Payer: Self-pay | Admitting: Cardiovascular Disease

## 2013-08-29 ENCOUNTER — Encounter (INDEPENDENT_AMBULATORY_CARE_PROVIDER_SITE_OTHER): Payer: Self-pay

## 2013-08-29 ENCOUNTER — Ambulatory Visit (INDEPENDENT_AMBULATORY_CARE_PROVIDER_SITE_OTHER): Payer: Medicare Other | Admitting: Cardiovascular Disease

## 2013-08-29 VITALS — BP 130/82 | HR 58 | Ht 63.0 in | Wt 206.5 lb

## 2013-08-29 DIAGNOSIS — E785 Hyperlipidemia, unspecified: Secondary | ICD-10-CM | POA: Diagnosis not present

## 2013-08-29 DIAGNOSIS — E669 Obesity, unspecified: Secondary | ICD-10-CM | POA: Diagnosis not present

## 2013-08-29 DIAGNOSIS — Z79899 Other long term (current) drug therapy: Secondary | ICD-10-CM

## 2013-08-29 DIAGNOSIS — I2510 Atherosclerotic heart disease of native coronary artery without angina pectoris: Secondary | ICD-10-CM | POA: Diagnosis not present

## 2013-08-29 DIAGNOSIS — I10 Essential (primary) hypertension: Secondary | ICD-10-CM

## 2013-08-29 NOTE — Progress Notes (Signed)
Patient ID: Edwin Woodward, male    DOB: Apr 15, 1951, 62 y.o.   MRN: [id number]  HPI Comments: Mr Nix is a very pleasant 62 year-old gentleman who has a history of coronary artery disease, bare-metal stents placed to his proximal RCA and LAD in April 2009, history of total knee replacements bilaterally, hypertension, hyperlipidemia, anxiety and ADHD who presents for routine followup.   Overall he is doing well. He reports that he continues to gain weight. Weight is now more than 200 pounds, up to 206 which is 9 pounds higher than February 2014. Otherwise she reports that he feels well. He is not exercising on a regular basis. He continues to play with his drums though only short periods of time.  He has no new complaints. No significant chest pain or shortness of breath.   cholesterol at goal less than 150  EKG shows normal sinus rhythm with rate 58 beats per minute, right bundle branch block, no significant ST or T wave changes, LAFB        Outpatient Encounter Prescriptions as of 08/29/2013  Medication Sig  . alfuzosin (UROXATRAL) 10 MG 24 hr tablet Take 10 mg by mouth daily.    Marland Kitchen ALPRAZolam (XANAX XR) 1 MG 24 hr tablet Take 1 tablet (1 mg total) by mouth every morning.  Marland Kitchen aspirin 81 MG EC tablet Take 81 mg by mouth daily.    Marland Kitchen atenolol (TENORMIN) 25 MG tablet TAKE 1/2 TABLET BY MOUTH DAILY  . atorvastatin (LIPITOR) 40 MG tablet 20 mg every other day.  Marland Kitchen buPROPion (WELLBUTRIN SR) 150 MG 12 hr tablet Take one tablet in the am & one tablet @@ 2 pm daily.  . Calcium Carbonate-Vitamin D (CALTRATE 600+D) 600-400 MG-UNIT per tablet Take 2 tablets by mouth daily.    . clindamycin (CLEOCIN) 300 MG capsule 2 CAPSULES ONE HOUR BEFORE DENTAL PROCEDURE  . diclofenac sodium (VOLTAREN) 1 % GEL Apply 1 application topically as needed.  . doxycycline (VIBRA-TABS) 100 MG tablet Take 1 tablet (100 mg total) by mouth 2 (two) times daily.  Marland Kitchen guaiFENesin (MUCINEX) 600 MG 12 hr tablet Take 1,200 mg  by mouth as needed for congestion.  Marland Kitchen ibuprofen (ADVIL) 200 MG tablet Take 200 mg by mouth every 6 (six) hours as needed for pain.  Marland Kitchen LIPITOR 40 MG tablet TAKE 1 TABLET (40 MG TOTAL) BY MOUTH DAILY.  Marland Kitchen lisinopril (PRINIVIL,ZESTRIL) 5 MG tablet Take 5 mg by mouth daily as needed.  . loratadine (CLARITIN) 10 MG tablet Take 10 mg by mouth as needed for allergies.  . mometasone (NASONEX) 50 MCG/ACT nasal spray Place 2 sprays into the nose as needed.  . naproxen (NAPROSYN) 500 MG tablet Take 500 mg by mouth as needed.  . naproxen sodium (ANAPROX) 220 MG tablet Take 220 mg by mouth as needed.  . nitroGLYCERIN (NITROSTAT) 0.4 MG SL tablet Place 1 tablet (0.4 mg total) under the tongue every 5 (five) minutes as needed.  . pantoprazole (PROTONIX) 40 MG tablet Take 1 tablet (40 mg total) by mouth daily.  Marland Kitchen PARoxetine (PAXIL-CR) 25 MG 24 hr tablet Take 1 tablet (25 mg total) by mouth every morning.  Marland Kitchen PLAVIX 75 MG tablet TAKE 1 TABLET (75 MG TOTAL) BY MOUTH DAILY.  Marland Kitchen QUEtiapine (SEROQUEL) 25 MG tablet Take 50 mg by mouth at bedtime.  . [DISCONTINUED] atenolol (TENORMIN) 25 MG tablet TAKE 1/2 TABLETS (12.5 MG TOTAL) BY MOUTH DAILY.      Review of Systems  Constitutional: Positive  for unexpected weight change.  HENT: Negative.   Eyes: Negative.   Respiratory: Negative.   Cardiovascular: Negative.   Gastrointestinal: Negative.   Endocrine: Negative.   Musculoskeletal: Negative.        Trigger finger  Skin: Negative.   Allergic/Immunologic: Negative.   Neurological: Negative.   Hematological: Negative.   Psychiatric/Behavioral: The patient is nervous/anxious.   All other systems reviewed and are negative.    BP 130/82  Pulse 58  Ht 5\' 3"  (1.6 m)  Wt 206 lb 8 oz (93.668 kg)  BMI 36.59 kg/m2  Physical Exam  Nursing note and vitals reviewed. Constitutional: He is oriented to person, place, and time. He appears well-developed and well-nourished.  obesity  HENT:  Head: Normocephalic.   Nose: Nose normal.  Mouth/Throat: Oropharynx is clear and moist.  Eyes: Conjunctivae are normal. Pupils are equal, round, and reactive to light.  Neck: Normal range of motion. Neck supple. No JVD present.  Cardiovascular: Normal rate, regular rhythm, S1 normal, S2 normal, normal heart sounds and intact distal pulses.  Exam reveals no gallop and no friction rub.   No murmur heard. Pulmonary/Chest: Effort normal and breath sounds normal. No respiratory distress. He has no wheezes. He has no rales. He exhibits no tenderness.  Abdominal: Soft. Bowel sounds are normal. He exhibits no distension. There is no tenderness.  Musculoskeletal: Normal range of motion. He exhibits no edema and no tenderness.  Lymphadenopathy:    He has no cervical adenopathy.  Neurological: He is alert and oriented to person, place, and time. Coordination normal.  Skin: Skin is warm and dry. No rash noted. No erythema.  Psychiatric: He has a normal mood and affect. His behavior is normal. Judgment and thought content normal.      Assessment and Plan

## 2013-08-29 NOTE — Assessment & Plan Note (Signed)
Cholesterol is at goal on the current lipid regimen. No changes to the medications were made.  

## 2013-08-29 NOTE — Patient Instructions (Addendum)
You are doing well. No medication changes were made.  Please hold plavix for 5 days before the tooth extraction Stay on aspirin  Go back on plavix after surgery 1 to 2 days when bleeding stops  Please call us if you have new issues that need to be addressed before your next appt.  Your physician wants you to follow-up in: 12 months.  You will receive a reminder letter in the mail two months in advance. If you don't receive a letter, please call our office to schedule the follow-up appointment.

## 2013-08-29 NOTE — Assessment & Plan Note (Signed)
We have encouraged continued exercise, careful diet management in an effort to lose weight. 

## 2013-08-29 NOTE — Assessment & Plan Note (Signed)
Blood pressure is well controlled on today's visit. No changes made to the medications. 

## 2013-08-29 NOTE — Addendum Note (Signed)
Addended by: Festus Aloe on: 08/29/2013 02:48 PM   Modules accepted: Orders

## 2013-08-29 NOTE — Assessment & Plan Note (Signed)
Currently with no symptoms of angina. No further workup at this time. Continue current medication regimen. 

## 2013-08-30 LAB — LIPID PANEL
Chol/HDL Ratio: 3.1 ratio units (ref 0.0–5.0)
LDL Calculated: 64 mg/dL (ref 0–99)
Triglycerides: 84 mg/dL (ref 0–149)

## 2013-08-30 LAB — HEPATIC FUNCTION PANEL
AST: 22 IU/L (ref 0–40)
Albumin: 4.2 g/dL (ref 3.6–4.8)
Alkaline Phosphatase: 86 IU/L (ref 39–117)
Bilirubin, Direct: 0.1 mg/dL (ref 0.00–0.40)
Total Bilirubin: 0.3 mg/dL (ref 0.0–1.2)
Total Protein: 6.4 g/dL (ref 6.0–8.5)

## 2013-08-30 LAB — BASIC METABOLIC PANEL
BUN: 13 mg/dL (ref 8–27)
CO2: 23 mmol/L (ref 18–29)
Calcium: 9.1 mg/dL (ref 8.6–10.2)
Creatinine, Ser: 0.77 mg/dL (ref 0.76–1.27)
GFR calc non Af Amer: 97 mL/min/{1.73_m2} (ref >59)
Glucose: 98 mg/dL (ref 65–99)

## 2013-09-14 DIAGNOSIS — F331 Major depressive disorder, recurrent, moderate: Secondary | ICD-10-CM | POA: Diagnosis not present

## 2013-09-28 DIAGNOSIS — F331 Major depressive disorder, recurrent, moderate: Secondary | ICD-10-CM | POA: Diagnosis not present

## 2013-10-04 ENCOUNTER — Other Ambulatory Visit: Payer: Self-pay | Admitting: Cardiovascular Disease

## 2013-10-05 ENCOUNTER — Ambulatory Visit (INDEPENDENT_AMBULATORY_CARE_PROVIDER_SITE_OTHER): Payer: Medicare Other | Admitting: Cardiovascular Disease

## 2013-10-05 ENCOUNTER — Encounter: Payer: Self-pay | Admitting: Cardiovascular Disease

## 2013-10-05 ENCOUNTER — Other Ambulatory Visit: Payer: Self-pay | Admitting: *Deleted

## 2013-10-05 VITALS — BP 155/80 | Ht 63.0 in | Wt 206.0 lb

## 2013-10-05 DIAGNOSIS — I10 Essential (primary) hypertension: Secondary | ICD-10-CM | POA: Diagnosis not present

## 2013-10-05 DIAGNOSIS — I2510 Atherosclerotic heart disease of native coronary artery without angina pectoris: Secondary | ICD-10-CM

## 2013-10-05 DIAGNOSIS — F331 Major depressive disorder, recurrent, moderate: Secondary | ICD-10-CM | POA: Diagnosis not present

## 2013-10-05 DIAGNOSIS — F411 Generalized anxiety disorder: Secondary | ICD-10-CM | POA: Diagnosis not present

## 2013-10-05 MED ORDER — ATENOLOL 25 MG PO TABS: ORAL_TABLET | ORAL | Status: DC

## 2013-10-05 MED ORDER — ATORVASTATIN CALCIUM 40 MG PO TABS: ORAL_TABLET | ORAL | Status: DC

## 2013-10-05 MED ORDER — LISINOPRIL 10 MG PO TABS: 10.0000 mg | ORAL_TABLET | Freq: Every day | ORAL | Status: DC

## 2013-10-05 NOTE — Assessment & Plan Note (Signed)
He reports no chest pain or dyspnea. EKG is unchanged. Continue medical therapy.

## 2013-10-05 NOTE — Assessment & Plan Note (Signed)
BP  has been consistently running high and is associated with a headache. I recommend taking lisinopril on a regular basis and increasing the dose to 10 mg once daily. This can be increased if needed. I've asked him to continue monitoring blood pressure heart rate at home. Atenolol cannot be increased due to bifascicular block.

## 2013-10-05 NOTE — Progress Notes (Signed)
Patient ID: Edwin Woodward, male    DOB: 06-Oct-1950, 63 y.o.   MRN: [id number]  HPI Comments: Edwin Woodward is a very pleasant 63 year-old gentleman (patient of Dr.Gollan) who has a history of coronary artery disease, bare-metal stents placed to his proximal RCA and LAD in April 2009, history of total knee replacements bilaterally, hypertension, hyperlipidemia, anxiety and ADHD who presents for urgent evaluation regarding elevated blood pressure..   He had significant stress and anxiety related to a stressful situation with his ex-wife. She called him yesterday and they had an argument. After that, he started getting dizziness, weakness and headache. He reports chronic headaches related to stress. He had a routine visit with his dentist this morning but was noted to have a blood pressure of 140/106. Thus, he was sent to [place]. He denies any chest pain or shortness of breath. He is on small dose atenolol. He takes lisinopril 5 mg only as needed has been using this on a regular basis lately due to elevated blood pressure.      Outpatient Encounter Prescriptions as of 10/05/2013  Medication Sig  . alfuzosin (UROXATRAL) 10 MG 24 hr tablet Take 10 mg by mouth daily.    Marland Kitchen ALPRAZolam (XANAX XR) 1 MG 24 hr tablet Take 1 tablet (1 mg total) by mouth every morning.  Marland Kitchen aspirin 81 MG EC tablet Take 81 mg by mouth daily.    Marland Kitchen atenolol (TENORMIN) 25 MG tablet TAKE 1/2 TABLET BY MOUTH DAILY.  Marland Kitchen atenolol (TENORMIN) 25 MG tablet TAKE 1/2 TABLET BY MOUTH DAILY  . atorvastatin (LIPITOR) 40 MG tablet 20 mg every other day.  Marland Kitchen buPROPion (WELLBUTRIN SR) 150 MG 12 hr tablet Take one tablet in the am & one tablet @@ 2 pm daily.  . Calcium Carbonate-Vitamin D (CALTRATE 600+D) 600-400 MG-UNIT per tablet Take 2 tablets by mouth daily.    . clindamycin (CLEOCIN) 300 MG capsule 2 CAPSULES ONE HOUR BEFORE DENTAL PROCEDURE  . diclofenac sodium (VOLTAREN) 1 % GEL Apply 1 application topically as needed.  . doxycycline  (VIBRA-TABS) 100 MG tablet Take 1 tablet (100 mg total) by mouth 2 (two) times daily.  Marland Kitchen guaiFENesin (MUCINEX) 600 MG 12 hr tablet Take 1,200 mg by mouth as needed for congestion.  Marland Kitchen ibuprofen (ADVIL) 200 MG tablet Take 200 mg by mouth every 6 (six) hours as needed for pain.  Marland Kitchen LIPITOR 40 MG tablet TAKE 1 TABLET (40 MG TOTAL) BY MOUTH DAILY.  Marland Kitchen lisinopril (PRINIVIL,ZESTRIL) 5 MG tablet Take 5 mg by mouth daily as needed.  . loratadine (CLARITIN) 10 MG tablet Take 10 mg by mouth as needed for allergies.  . mometasone (NASONEX) 50 MCG/ACT nasal spray Place 2 sprays into the nose as needed.  . naproxen (NAPROSYN) 500 MG tablet Take 500 mg by mouth as needed.  . naproxen sodium (ANAPROX) 220 MG tablet Take 220 mg by mouth as needed.  . nitroGLYCERIN (NITROSTAT) 0.4 MG SL tablet Place 1 tablet (0.4 mg total) under the tongue every 5 (five) minutes as needed.  . pantoprazole (PROTONIX) 40 MG tablet Take 1 tablet (40 mg total) by mouth daily.  Marland Kitchen PARoxetine (PAXIL-CR) 25 MG 24 hr tablet Take 1 tablet (25 mg total) by mouth every morning.  Marland Kitchen PLAVIX 75 MG tablet TAKE 1 TABLET (75 MG TOTAL) BY MOUTH DAILY.  Marland Kitchen QUEtiapine (SEROQUEL) 25 MG tablet Take 50 mg by mouth at bedtime.      Review of Systems  Constitutional: Positive for unexpected  weight change.  HENT: Negative.   Eyes: Negative.   Respiratory: Negative.   Cardiovascular: Negative.   Gastrointestinal: Negative.   Endocrine: Negative.   Musculoskeletal: Negative.        Trigger finger  Skin: Negative.   Allergic/Immunologic: Negative.   Neurological: Negative.   Hematological: Negative.   Psychiatric/Behavioral: The patient is nervous/anxious.   All other systems reviewed and are negative.    There were no vitals taken for this visit.  Physical Exam  Nursing note and vitals reviewed. Constitutional: He is oriented to person, place, and time. He appears well-developed and well-nourished.  obesity  HENT:  Head: Normocephalic.   Nose: Nose normal.  Mouth/Throat: Oropharynx is clear and moist.  Eyes: Conjunctivae are normal. Pupils are equal, round, and reactive to light.  Neck: Normal range of motion. Neck supple. No JVD present.  Cardiovascular: Normal rate, regular rhythm, S1 normal, S2 normal, normal heart sounds and intact distal pulses.  Exam reveals no gallop and no friction rub.   No murmur heard. Pulmonary/Chest: Effort normal and breath sounds normal. No respiratory distress. He has no wheezes. He has no rales. He exhibits no tenderness.  Abdominal: Soft. Bowel sounds are normal. He exhibits no distension. There is no tenderness.  Musculoskeletal: Normal range of motion. He exhibits no edema and no tenderness.  Lymphadenopathy:    He has no cervical adenopathy.  Neurological: He is alert and oriented to person, place, and time. Coordination normal.  Skin: Skin is warm and dry. No rash noted. No erythema.  Psychiatric: He has a normal mood and affect. His behavior is normal. Judgment and thought content normal.     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   Assessment and Plan

## 2013-10-05 NOTE — Telephone Encounter (Signed)
Requested Prescriptions   Signed Prescriptions Disp Refills  . atenolol (TENORMIN) 25 MG tablet 90 tablet 3    Sig: TAKE 1/2 TABLET BY MOUTH DAILY    Authorizing Provider: Minna Merritts    Ordering User: Britt Bottom

## 2013-10-05 NOTE — Patient Instructions (Signed)
Your physician has recommended you make the following change in your medication:  Increase Lisinopril 10 mg daily   Keep your follow appt with Dr. Rockey Situ

## 2013-10-05 NOTE — Assessment & Plan Note (Signed)
He is followed by psychiatry for this.

## 2013-10-10 DIAGNOSIS — F331 Major depressive disorder, recurrent, moderate: Secondary | ICD-10-CM | POA: Diagnosis not present

## 2013-10-19 DIAGNOSIS — F331 Major depressive disorder, recurrent, moderate: Secondary | ICD-10-CM | POA: Diagnosis not present

## 2013-11-02 DIAGNOSIS — F331 Major depressive disorder, recurrent, moderate: Secondary | ICD-10-CM | POA: Diagnosis not present

## 2013-11-09 DIAGNOSIS — F331 Major depressive disorder, recurrent, moderate: Secondary | ICD-10-CM | POA: Diagnosis not present

## 2013-11-16 DIAGNOSIS — F331 Major depressive disorder, recurrent, moderate: Secondary | ICD-10-CM | POA: Diagnosis not present

## 2013-11-18 ENCOUNTER — Other Ambulatory Visit: Payer: Self-pay | Admitting: *Deleted

## 2013-11-18 MED ORDER — PANTOPRAZOLE SODIUM 40 MG PO TBEC: 40.0000 mg | DELAYED_RELEASE_TABLET | Freq: Every day | ORAL | Status: DC

## 2013-11-18 NOTE — Telephone Encounter (Signed)
Please advise to fill no oV since 4/14

## 2013-11-23 DIAGNOSIS — F331 Major depressive disorder, recurrent, moderate: Secondary | ICD-10-CM | POA: Diagnosis not present

## 2013-12-01 ENCOUNTER — Encounter: Payer: Self-pay | Admitting: Internal Medicine

## 2013-12-01 ENCOUNTER — Ambulatory Visit (INDEPENDENT_AMBULATORY_CARE_PROVIDER_SITE_OTHER): Payer: Medicare Other | Admitting: Internal Medicine

## 2013-12-01 ENCOUNTER — Encounter (INDEPENDENT_AMBULATORY_CARE_PROVIDER_SITE_OTHER): Payer: Self-pay

## 2013-12-01 VITALS — BP 138/84 | HR 88 | Temp 98.5°F | Resp 16 | Wt 196.8 lb

## 2013-12-01 DIAGNOSIS — E669 Obesity, unspecified: Secondary | ICD-10-CM

## 2013-12-01 DIAGNOSIS — I2510 Atherosclerotic heart disease of native coronary artery without angina pectoris: Secondary | ICD-10-CM

## 2013-12-01 DIAGNOSIS — J309 Allergic rhinitis, unspecified: Secondary | ICD-10-CM

## 2013-12-01 DIAGNOSIS — Z9989 Dependence on other enabling machines and devices: Secondary | ICD-10-CM

## 2013-12-01 DIAGNOSIS — I10 Essential (primary) hypertension: Secondary | ICD-10-CM

## 2013-12-01 DIAGNOSIS — G4733 Obstructive sleep apnea (adult) (pediatric): Secondary | ICD-10-CM | POA: Diagnosis not present

## 2013-12-01 MED ORDER — PREDNISONE (PAK) 10 MG PO TABS: ORAL_TABLET | ORAL | Status: DC

## 2013-12-01 NOTE — Patient Instructions (Addendum)
Your ears do NOT look infected.  But the pressure you are feeling may be due to inflammation from allergies  I have called in a 6 day prednisone taper for the full feeling and inflammation in your ear .   You can add Afrin nasal spray for a few days if needed to resolve the pressure in your ear and resume Zyrtec (cetirizine) for allergies   2) Obesity:  congratulations on the weight loss !  keep going!  Your first goal is 19 lbs (10% of your body weight )   Your  ultimate goal is 168 lbs ,  To get your BMI < 30 (the threshhold for obesity)

## 2013-12-01 NOTE — Progress Notes (Signed)
Patient ID: Edwin Woodward, male   DOB: Aug 27, 1951, 63 y.o.   MRN: [id number]  Patient Active Problem List   Diagnosis Date Noted  . Allergic rhinitis 12/04/2013  . Sinusitis Jan 13, 2013  . Acute URI of multiple sites 08/01/2012  . Otitis media 09/28/2011  . Thalassemia 08/16/2011  . Tendonitis of wrist, right 08/16/2011  . GAD (generalized anxiety disorder) 05/14/2011  . Obesity 04/11/2011  . OSA on CPAP 04/11/2011  . HYPERLIPIDEMIA-MIXED 11/22/2009  . HYPERTENSION, BENIGN 11/22/2009  . CAD, NATIVE VESSEL 11/22/2009    Subjective:  CC:   Chief Complaint  Patient presents with  . Depression    Due to death in family since 2023-01-14 things have been really bad.    HPI:   Edwin Woodward is a 63 y.o. male who presents for  Multiple issues to discuss:  1) tongue ulcer 1.5 wks ago in the setting of head cold  And ears popping and allergies.  Has been using an OTC med for canker sore, and nasonex , saline nasal lmist,  And flushing with Netti pot   2) Obesity .  Last seen in 2013  Has lost weight of  10 lb since December .   3) allergic rhinitis.  Taking nasonex at night  but not allegra. Not sneezing a lot bur feels off balance feels like he has fluid on the ear.    4) Needs supplies for CPAP .last sleep study was 3 yrs ago in [place]   5) anxiety  Seeing Dr Weber Cooks for management of GAD  .   Eva's mother died 3 weeks ago after a progressive decline and this has been a stress on him .      Past Medical History  Diagnosis Date  . CAD (coronary artery disease)   . Presence of bare metal stent in right coronary artery     proximal RCA adn LAD in April 2009  . Hyperlipidemia   . Hypertension   . Cardiovascular stress test abnormal     Jan 2010 no significant ischemia  . Trigger finger     Past Surgical History  Procedure Laterality Date  . Replacement total knee    . Coronary artery bypass graft      2 vessel  . Joint replacement      total knee  . Appendectomy     . Carpal tunnel release    . Vasectomy    . Coronary angioplasty  12/2007  . Appendectomy    . Tonsillectomy    . Cataract extraction      right       The following portions of the patient's history were reviewed and updated as appropriate: Allergies, current medications, and problem list.    Review of Systems:   Patient denies headache, fevers, malaise, unintentional weight loss, skin rash, eye pain, sinus congestion and sinus pain, sore throat, dysphagia,  hemoptysis , cough, dyspnea, wheezing, chest pain, palpitations, orthopnea, edema, abdominal pain, nausea, melena, diarrhea, constipation, flank pain, dysuria, hematuria, urinary  Frequency, nocturia, numbness, tingling, seizures,  Focal weakness, Loss of consciousness,  Tremor, insomnia, depression, anxiety, and suicidal ideation.     History   Social History  . Marital Status: Married    Spouse Name: N/A    Number of Children: N/A  . Years of Education: N/A   Occupational History  . Not on file.   Social History Main Topics  . Smoking status: Former Research scientist (life sciences)  . Smokeless tobacco: Never Used  . Alcohol  Use: No  . Drug Use: Yes    Special: Marijuana  . Sexual Activity: Not on file   Other Topics Concern  . Not on file   Social History Narrative   Lives with wife.    Objective:  Filed Vitals:   12/01/13 1522  BP: 138/84  Pulse: 88  Temp: 98.5 F (36.9 C)  Resp: 16     General appearance: alert, cooperative and appears stated age Ears: normal TM's and external ear canals both ears Throat: lips, mucosa, and tongue normal; teeth and gums normal Neck: no adenopathy, no carotid bruit, supple, symmetrical, trachea midline and thyroid not enlarged, symmetric, no tenderness/mass/nodules Back: symmetric, no curvature. ROM normal. No CVA tenderness. Lungs: clear to auscultation bilaterally Heart: regular rate and rhythm, S1, S2 normal, no murmur, click, rub or gallop Abdomen: soft, non-tender; bowel sounds  normal; no masses,  no organomegaly Pulses: 2+ and symmetric Skin: Skin color, texture, turgor normal. No rashes or lesions Lymph nodes: Cervical, supraclavicular, and axillary nodes normal.  Assessment and Plan:  HYPERTENSION, BENIGN Well controlled on current regimen. Renal function stable, no changes today.  Lab Results  Component Value Date   CREATININE 0.77 08/29/2013     Obesity I have addressed  BMI and recommended wt loss of 10% of body weigh over the next 6 months using a low glycemic index diet and regular exercise a minimum of 5 days per week.    OSA on CPAP Diagnosed by sleep study. he is wearing her CPAP every night a minimum of 6 hours per night.   Allergic rhinitis With some congestion reported.   Exam normal  Prednisone taper , Afrin and zyrtec.    Updated Medication List Outpatient Encounter Prescriptions as of 12/01/2013  Medication Sig  . alfuzosin (UROXATRAL) 10 MG 24 hr tablet Take 10 mg by mouth daily.    Marland Kitchen ALPRAZolam (XANAX XR) 1 MG 24 hr tablet Take 1 tablet (1 mg total) by mouth every morning.  Marland Kitchen aspirin 81 MG EC tablet Take 81 mg by mouth daily.    Marland Kitchen atenolol (TENORMIN) 25 MG tablet TAKE 1/2 TABLET BY MOUTH DAILY.  Marland Kitchen atorvastatin (LIPITOR) 40 MG tablet Take 40 mg alt with 20 mg daily.  Marland Kitchen atorvastatin (LIPITOR) 40 MG tablet TAKE 1 TABLET (40 MG TOTAL) BY MOUTH DAILY.  Marland Kitchen buPROPion (WELLBUTRIN SR) 150 MG 12 hr tablet Take one tablet in the am & one tablet @@ 2 pm daily.  . Calcium Carbonate-Vitamin D (CALTRATE 600+D) 600-400 MG-UNIT per tablet Take 2 tablets by mouth daily.    . diclofenac sodium (VOLTAREN) 1 % GEL Apply 1 application topically as needed.  Marland Kitchen ibuprofen (ADVIL) 200 MG tablet Take 200 mg by mouth every 6 (six) hours as needed for pain.  Marland Kitchen lisinopril (PRINIVIL,ZESTRIL) 10 MG tablet Take 1 tablet (10 mg total) by mouth daily.  Marland Kitchen loratadine (CLARITIN) 10 MG tablet Take 10 mg by mouth as needed for allergies.  . naproxen (NAPROSYN) 500 MG  tablet Take 500 mg by mouth as needed.  . nitroGLYCERIN (NITROSTAT) 0.4 MG SL tablet Place 1 tablet (0.4 mg total) under the tongue every 5 (five) minutes as needed.  . pantoprazole (PROTONIX) 40 MG tablet Take 1 tablet (40 mg total) by mouth daily.  Marland Kitchen PARoxetine (PAXIL-CR) 25 MG 24 hr tablet Take 1 tablet (25 mg total) by mouth every morning.  Marland Kitchen PLAVIX 75 MG tablet TAKE 1 TABLET (75 MG TOTAL) BY MOUTH DAILY.  Marland Kitchen QUEtiapine (SEROQUEL) 25  MG tablet Take 50 mg by mouth at bedtime.  . clindamycin (CLEOCIN) 300 MG capsule 2 CAPSULES ONE HOUR BEFORE DENTAL PROCEDURE  . mometasone (NASONEX) 50 MCG/ACT nasal spray Place 2 sprays into the nose as needed.  . predniSONE (STERAPRED UNI-PAK) 10 MG tablet 6 tablets on Day 1 , then reduce by 1 tablet daily until gone     No orders of the defined types were placed in this encounter.    Return in about 6 months (around 06/03/2014).

## 2013-12-04 DIAGNOSIS — J309 Allergic rhinitis, unspecified: Secondary | ICD-10-CM | POA: Insufficient documentation

## 2013-12-04 NOTE — Assessment & Plan Note (Addendum)
With some congestion reported.   Exam normal  Prednisone taper , Afrin and zyrtec.

## 2013-12-04 NOTE — Assessment & Plan Note (Signed)
I have addressed  BMI and recommended wt loss of 10% of body weigh over the next 6 months using a low glycemic index diet and regular exercise a minimum of 5 days per week.   

## 2013-12-04 NOTE — Assessment & Plan Note (Signed)
Well controlled on current regimen. Renal function stable, no changes today.  Lab Results  Component Value Date   CREATININE 0.77 08/29/2013

## 2013-12-04 NOTE — Assessment & Plan Note (Signed)
Diagnosed by sleep study. he is wearing her CPAP every night a minimum of 6 hours per night.  

## 2013-12-07 DIAGNOSIS — F331 Major depressive disorder, recurrent, moderate: Secondary | ICD-10-CM | POA: Diagnosis not present

## 2013-12-14 DIAGNOSIS — F331 Major depressive disorder, recurrent, moderate: Secondary | ICD-10-CM | POA: Diagnosis not present

## 2013-12-21 DIAGNOSIS — F331 Major depressive disorder, recurrent, moderate: Secondary | ICD-10-CM | POA: Diagnosis not present

## 2014-01-03 ENCOUNTER — Telehealth: Payer: Self-pay | Admitting: Internal Medicine

## 2014-01-03 NOTE — Telephone Encounter (Signed)
Patient has an apointment with Raquel for 01/04/14 please advise, patient currently in shower and cannot come to phone.

## 2014-01-03 NOTE — Telephone Encounter (Signed)
Tried to call patient , was reported patient in bed could not talk to patient informed by wife patient sleeping informed patient wife I could not Discuss with her due to no DPR signed.

## 2014-01-03 NOTE — Telephone Encounter (Signed)
Since Friday having trouble walking.  If sits down he cannot get up, no momentum.  Fever on and off.  Herniated disk in upper and lower back.  Concerned because equilibrium is off.  Says no vehicle today, will have a vehicle tomorrow.  Would like to speak with someone for advice.  Transferred to triage but pt states it asked for a call back number.  Pt states he borrowed a phone and does not know what the number is so cannot leave a call back number.  Pt is going to make an appt to be seen tomorrow but would like to speak with someone today about his concerns.

## 2014-01-04 ENCOUNTER — Ambulatory Visit (INDEPENDENT_AMBULATORY_CARE_PROVIDER_SITE_OTHER): Payer: Medicare Other | Admitting: Adult Health

## 2014-01-04 ENCOUNTER — Encounter: Payer: Self-pay | Admitting: Adult Health

## 2014-01-04 VITALS — BP 130/84 | HR 73 | Temp 98.0°F | Resp 16 | Wt 191.8 lb

## 2014-01-04 DIAGNOSIS — R519 Headache, unspecified: Secondary | ICD-10-CM

## 2014-01-04 DIAGNOSIS — I2510 Atherosclerotic heart disease of native coronary artery without angina pectoris: Secondary | ICD-10-CM

## 2014-01-04 DIAGNOSIS — R51 Headache: Secondary | ICD-10-CM | POA: Diagnosis not present

## 2014-01-04 DIAGNOSIS — IMO0001 Reserved for inherently not codable concepts without codable children: Secondary | ICD-10-CM | POA: Diagnosis not present

## 2014-01-04 DIAGNOSIS — M791 Myalgia, unspecified site: Secondary | ICD-10-CM

## 2014-01-04 MED ORDER — METHOCARBAMOL 750 MG PO TABS: 750.0000 mg | ORAL_TABLET | Freq: Three times a day (TID) | ORAL | Status: DC | PRN

## 2014-01-04 MED ORDER — PREDNISONE 10 MG PO TABS: ORAL_TABLET | ORAL | Status: DC

## 2014-01-04 NOTE — Progress Notes (Signed)
Pre visit review using our clinic review tool, if applicable. No additional management support is needed unless otherwise documented below in the visit note. 

## 2014-01-04 NOTE — Progress Notes (Signed)
Subjective:    Patient ID: Edwin Woodward, male    DOB: 31-May-1951, 63 y.o.   MRN: [id number]  HPI Pt is a pleasant 63 year old male with history of 3 herniated discs in the cervical spine presents to clinic with an 11 day history of increasing neck pain. He reports the pain is a shooting pain with certain movements. He also reports that the shooting sensation will move into his jaw. He also reports occasional temporal pain bilaterally. He was seen one month ago by Dr. Derrel Nip with c/o feeling "off balance". He was treated with a prednisone taper and reports improvement. He denies any recent illnesses. Denies fever, headache. He denies any chest pain or shortness of breath. He c/o dry mouth but no trouble swallowing.    Past Medical History  Diagnosis Date  . CAD (coronary artery disease)   . Presence of bare metal stent in right coronary artery     proximal RCA adn LAD in April 2009  . Hyperlipidemia   . Hypertension   . Cardiovascular stress test abnormal     Jan 2010 no significant ischemia  . Trigger finger      Past Surgical History  Procedure Laterality Date  . Replacement total knee    . Coronary artery bypass graft      2 vessel  . Joint replacement      total knee  . Appendectomy    . Carpal tunnel release    . Vasectomy    . Coronary angioplasty  12/2007  . Appendectomy    . Tonsillectomy    . Cataract extraction      right     Family History  Problem Relation Age of Onset  . Hypertension Mother   . Diabetes Mother   . Thalassemia Mother   . Heart disease Father      History   Social History  . Marital Status: Married    Spouse Name: N/A    Number of Children: N/A  . Years of Education: N/A   Occupational History  . Not on file.   Social History Main Topics  . Smoking status: Former Research scientist (life sciences)  . Smokeless tobacco: Never Used  . Alcohol Use: No  . Drug Use: Yes    Special: Marijuana  . Sexual Activity: Not on file   Other Topics Concern  .  Not on file   Social History Narrative   Lives with wife.    Review of Systems  Constitutional: Negative for fever and chills.  Respiratory: Negative for chest tightness and shortness of breath.   Cardiovascular: Negative for chest pain and palpitations.  Musculoskeletal: Positive for neck pain and neck stiffness.       Shooting pain in neck with certain movements of his head  Neurological: Negative for dizziness and headaches.       Reports occasional temporal pain       Objective:   Physical Exam  Constitutional: He is oriented to person, place, and time.  Appears shaky and uncomfortable. No distress.  HENT:  Head: Normocephalic and atraumatic.  Eyes: Conjunctivae and EOM are normal.  Neck: Neck supple.  Cardiovascular: Normal rate and regular rhythm.   Pulmonary/Chest: Effort normal. No respiratory distress.  Musculoskeletal: He exhibits tenderness.  Tenderness with palpation of cervical spine. Pt with decreased neck ROM. Stiff. Moving all 4 extremities.  Neurological: He is alert and oriented to person, place, and time.  Skin: Skin is warm and dry.  Psychiatric: He has a normal  mood and affect. His behavior is normal. Judgment and thought content normal.      Assessment & Plan:   1. Muscle pain Suspect his neck pain is 2/2 herniated disk flare up. Also having some muscle pain 2/2 compensatory response. I have started him a a prednisone taper. Instructed not to take any NSAIDs while on the taper. I am checking labs to make sure this may not be rhabdomyolysis. He does not have any fever so I do not suspect infection. I have also given him a short course of muscle relaxers.  - CBC with Differential - Comprehensive metabolic panel - CK  2. Temporal pain As he was getting ready to leave, he mentioned that he did have some temporal pain that was intermittent. I will check a sed rate. If elevated will refer for further evaluation of temporal arteritis.  - Sedimentation  rate

## 2014-01-04 NOTE — Patient Instructions (Addendum)
  Start prednisone taper as follows:  Day #1 - take 6 tablets Day #2 - take 5 tablets Day #3 - take 4 tablets Day #4 - take 3 tablets Day #5 - take 2 tablets Day #6 - take 1 tablet   Do not take any over the counter NSAIDS such as Aleve, ibuprofen, motrin, aspirin while taking the prednisone taper. Once you are done you can resume taking your anti-inflammatory medications.  Start Robaxin (muscle relaxer) 3 times a day for muscle spasms.  I will call you with the lab results and further instruction, if any, once the labs are available.  I want you to return to see Dr. Derrel Nip for follow up on May 11 and 2:30pm. Please arrive 10 min before your appointment.

## 2014-01-05 ENCOUNTER — Telehealth: Payer: Self-pay | Admitting: *Deleted

## 2014-01-05 LAB — COMPREHENSIVE METABOLIC PANEL
ALT: 27 U/L (ref 0–53)
AST: 27 U/L (ref 0–37)
Albumin: 3.6 g/dL (ref 3.5–5.2)
Alkaline Phosphatase: 61 U/L (ref 39–117)
BUN: 13 mg/dL (ref 6–23)
CALCIUM: 9.6 mg/dL (ref 8.4–10.5)
CHLORIDE: 100 meq/L (ref 96–112)
CO2: 29 meq/L (ref 19–32)
CREATININE: 0.8 mg/dL (ref 0.4–1.5)
GFR: 97.9 mL/min (ref >60.00)
GLUCOSE: 81 mg/dL (ref 70–99)
POTASSIUM: 4.1 meq/L (ref 3.5–5.1)
Sodium: 139 mEq/L (ref 135–145)
Total Bilirubin: 0.9 mg/dL (ref 0.3–1.2)
Total Protein: 7.4 g/dL (ref 6.0–8.3)

## 2014-01-05 LAB — SEDIMENTATION RATE: SED RATE: 64 mm/h — AB (ref 0–22)

## 2014-01-05 LAB — CK: Total CK: 103 U/L (ref 7–232)

## 2014-01-05 NOTE — Telephone Encounter (Signed)
Lab called they would like another sample for the cbc due to clumping

## 2014-01-05 NOTE — Telephone Encounter (Signed)
Can you call pt and let him know? Thanks

## 2014-01-05 NOTE — Telephone Encounter (Signed)
Called pt didn't get an answer left a vmail for pt to call back

## 2014-01-10 ENCOUNTER — Other Ambulatory Visit: Payer: Self-pay | Admitting: Adult Health

## 2014-01-10 DIAGNOSIS — R700 Elevated erythrocyte sedimentation rate: Secondary | ICD-10-CM

## 2014-01-10 NOTE — Progress Notes (Signed)
Spoke with patient and he request that I speak with Harmon Pier (his wife) patient could not hear me very well. Notified wife of Raquel's comments. Lab appointment schedule for Thursday 01/12/14 at 2pm for repeat sed rate.

## 2014-01-10 NOTE — Progress Notes (Signed)
Called patient no answer unable to leave a voicemail.

## 2014-01-12 ENCOUNTER — Other Ambulatory Visit (INDEPENDENT_AMBULATORY_CARE_PROVIDER_SITE_OTHER): Payer: Medicare Other

## 2014-01-12 DIAGNOSIS — IMO0001 Reserved for inherently not codable concepts without codable children: Secondary | ICD-10-CM

## 2014-01-12 DIAGNOSIS — R700 Elevated erythrocyte sedimentation rate: Secondary | ICD-10-CM | POA: Diagnosis not present

## 2014-01-12 LAB — SEDIMENTATION RATE: Sed Rate: 22 mm/hr (ref 0–22)

## 2014-01-13 LAB — CBC WITH DIFFERENTIAL/PLATELET
BASOS ABS: 0 10*3/uL (ref 0.0–0.1)
Basophils Relative: 0.2 % (ref 0.0–3.0)
Eosinophils Absolute: 0.1 10*3/uL (ref 0.0–0.7)
Eosinophils Relative: 1 % (ref 0.0–5.0)
HEMATOCRIT: 37.6 % — AB (ref 39.0–52.0)
Hemoglobin: 12 g/dL — ABNORMAL LOW (ref 13.0–17.0)
LYMPHS ABS: 2.4 10*3/uL (ref 0.7–4.0)
Lymphocytes Relative: 26 % (ref 12.0–46.0)
MCHC: 31.8 g/dL (ref 30.0–36.0)
MCV: 68.2 fl — ABNORMAL LOW (ref 78.0–100.0)
MONO ABS: 0.6 10*3/uL (ref 0.1–1.0)
Monocytes Relative: 6.4 % (ref 3.0–12.0)
NEUTROS PCT: 66.4 % (ref 43.0–77.0)
Neutro Abs: 6 10*3/uL (ref 1.4–7.7)
Platelets: 510 10*3/uL — ABNORMAL HIGH (ref 150.0–400.0)
RBC: 5.51 Mil/uL (ref 4.22–5.81)
RDW: 16.2 % — AB (ref 11.5–15.5)
WBC: 9.1 10*3/uL (ref 4.0–10.5)

## 2014-01-16 ENCOUNTER — Ambulatory Visit: Payer: Medicare Other | Admitting: Internal Medicine

## 2014-01-17 ENCOUNTER — Other Ambulatory Visit: Payer: Self-pay | Admitting: Cardiovascular Disease

## 2014-01-18 ENCOUNTER — Encounter: Payer: Self-pay | Admitting: Internal Medicine

## 2014-01-18 ENCOUNTER — Ambulatory Visit (INDEPENDENT_AMBULATORY_CARE_PROVIDER_SITE_OTHER): Payer: Medicare Other | Admitting: Internal Medicine

## 2014-01-18 VITALS — BP 124/78 | HR 85 | Temp 98.6°F | Resp 16 | Wt 193.2 lb

## 2014-01-18 DIAGNOSIS — D649 Anemia, unspecified: Secondary | ICD-10-CM | POA: Diagnosis not present

## 2014-01-18 DIAGNOSIS — E669 Obesity, unspecified: Secondary | ICD-10-CM

## 2014-01-18 DIAGNOSIS — Z9989 Dependence on other enabling machines and devices: Secondary | ICD-10-CM

## 2014-01-18 DIAGNOSIS — R5383 Other fatigue: Secondary | ICD-10-CM

## 2014-01-18 DIAGNOSIS — D62 Acute posthemorrhagic anemia: Secondary | ICD-10-CM

## 2014-01-18 DIAGNOSIS — R5381 Other malaise: Secondary | ICD-10-CM

## 2014-01-18 DIAGNOSIS — D569 Thalassemia, unspecified: Secondary | ICD-10-CM

## 2014-01-18 DIAGNOSIS — G4733 Obstructive sleep apnea (adult) (pediatric): Secondary | ICD-10-CM | POA: Diagnosis not present

## 2014-01-18 DIAGNOSIS — M542 Cervicalgia: Secondary | ICD-10-CM

## 2014-01-18 LAB — COMPREHENSIVE METABOLIC PANEL
ALT: 26 U/L (ref 0–53)
AST: 30 U/L (ref 0–37)
Albumin: 3.7 g/dL (ref 3.5–5.2)
Alkaline Phosphatase: 62 U/L (ref 39–117)
BUN: 12 mg/dL (ref 6–23)
CALCIUM: 8.8 mg/dL (ref 8.4–10.5)
CHLORIDE: 100 meq/L (ref 96–112)
CO2: 29 mEq/L (ref 19–32)
Creatinine, Ser: 0.9 mg/dL (ref 0.4–1.5)
GFR: 91.57 mL/min (ref >60.00)
GLUCOSE: 81 mg/dL (ref 70–99)
Potassium: 3.8 mEq/L (ref 3.5–5.1)
Sodium: 140 mEq/L (ref 135–145)
Total Bilirubin: 0.8 mg/dL (ref 0.2–1.2)
Total Protein: 6.3 g/dL (ref 6.0–8.3)

## 2014-01-18 LAB — IRON AND TIBC
%SAT: 24 % (ref 20–55)
Iron: 75 ug/dL (ref 42–165)
TIBC: 317 ug/dL (ref 215–435)
UIBC: 242 ug/dL (ref 125–400)

## 2014-01-18 LAB — VITAMIN B12: VITAMIN B 12: 556 pg/mL (ref 211–911)

## 2014-01-18 LAB — FERRITIN: Ferritin: 101.6 ng/mL (ref 22.0–322.0)

## 2014-01-18 LAB — TSH: TSH: 2.1 u[IU]/mL (ref 0.35–4.50)

## 2014-01-18 NOTE — Progress Notes (Signed)
Patient ID: Edwin Woodward, male   DOB: 1951-02-19, 63 y.o.   MRN: [id number]   Patient Active Problem List   Diagnosis Date Noted  . Musculoskeletal neck pain 01/19/2014  . Allergic rhinitis 12/04/2013  . Thalassemia 08/16/2011  . Tendonitis of wrist, right 08/16/2011  . GAD (generalized anxiety disorder) 05/14/2011  . Obesity 04/11/2011  . OSA on CPAP 04/11/2011  . HYPERLIPIDEMIA-MIXED 11/22/2009  . HYPERTENSION, BENIGN 11/22/2009  . CAD, NATIVE VESSEL 11/22/2009    Subjective:  CC:   Chief Complaint  Patient presents with  . Follow-up    Lab results and folowing up on pain in neck.    HPI: Follow up on abnormal labs. Patient was evaluated recently by NP Raquel Rey for neck pain and was referred back to me for evaluation of anemia.   His neck pain has improved with prednisone taper   .has a history of thalassemia,  And he has no recent episodes of bleeding but hgb has dropped a little bit. He denies rectal bleeding .  He takes  protonix for GI prophylaxis  For NSAIDs taken during hand therapy and has not had any symptoms of reflux.    Edwin Woodward is a 63 y.o. male who presents for   Past Medical History  Diagnosis Date  . CAD (coronary artery disease)   . Presence of bare metal stent in right coronary artery     proximal RCA adn LAD in April 2009  . Hyperlipidemia   . Hypertension   . Cardiovascular stress test abnormal     Jan 2010 no significant ischemia  . Trigger finger     Past Surgical History  Procedure Laterality Date  . Replacement total knee    . Coronary artery bypass graft      2 vessel  . Joint replacement      total knee  . Appendectomy    . Carpal tunnel release    . Vasectomy    . Coronary angioplasty  12/2007  . Appendectomy    . Tonsillectomy    . Cataract extraction      right       The following portions of the patient's history were reviewed and updated as appropriate: Allergies, current medications, and problem  list.    Review of Systems:   Patient denies headache, fevers, malaise, unintentional weight loss, skin rash, eye pain, sinus congestion and sinus pain, sore throat, dysphagia,  hemoptysis , cough, dyspnea, wheezing, chest pain, palpitations, orthopnea, edema, abdominal pain, nausea, melena, diarrhea, constipation, flank pain, dysuria, hematuria, urinary  Frequency, nocturia, numbness, tingling, seizures,  Focal weakness, Loss of consciousness,  Tremor, insomnia, depression, anxiety, and suicidal ideation.     History   Social History  . Marital Status: Married    Spouse Name: N/A    Number of Children: N/A  . Years of Education: N/A   Occupational History  . Not on file.   Social History Main Topics  . Smoking status: Former Research scientist (life sciences)  . Smokeless tobacco: Never Used  . Alcohol Use: No  . Drug Use: Yes    Special: Marijuana  . Sexual Activity: Not on file   Other Topics Concern  . Not on file   Social History Narrative   Lives with wife.    Objective:  Filed Vitals:   01/18/14 1038  BP: 124/78  Pulse: 85  Temp: 98.6 F (37 C)  Resp: 16     General appearance: alert, cooperative and appears stated age  Ears: normal TM's and external ear canals both ears Throat: lips, mucosa, and tongue normal; teeth and gums normal Neck: no adenopathy, no carotid bruit, supple, symmetrical, trachea midline and thyroid not enlarged, symmetric, no tenderness/mass/nodules Back: symmetric, no curvature. ROM normal. No CVA tenderness. Lungs: clear to auscultation bilaterally Heart: regular rate and rhythm, S1, S2 normal, no murmur, click, rub or gallop Abdomen: soft, non-tender; bowel sounds normal; no masses,  no organomegaly Pulses: 2+ and symmetric Skin: Skin color, texture, turgor normal. No rashes or lesions Lymph nodes: Cervical, supraclavicular, and axillary nodes normal.  Assessment and Plan:  OSA on CPAP Diagnosed by sleep study. he is not wearing his CPAP consistently.   Discussed the risk of untreated sleep apnea and advised patient to increase compliance .     Thalassemia His mild drop in hgb is asymptomatic.  There is no sign of  Concurrent iron , b12 or folate deficiency  Obesity I have addressed  BMI and recommended wt loss of 10% of body weigh over the next 6 months using a low glycemic index diet and regular exercise a minimum of 5 days per week.    Musculoskeletal neck pain Aggravated by drumming,  Now improved.  May resume naproxen and PPI   Updated Medication List Outpatient Encounter Prescriptions as of 01/18/2014  Medication Sig  . alfuzosin (UROXATRAL) 10 MG 24 hr tablet Take 10 mg by mouth daily.    Marland Kitchen ALPRAZolam (XANAX XR) 1 MG 24 hr tablet Take 1 tablet (1 mg total) by mouth every morning.  Marland Kitchen aspirin 81 MG EC tablet Take 81 mg by mouth daily.    Marland Kitchen atenolol (TENORMIN) 25 MG tablet TAKE 1/2 TABLET BY MOUTH DAILY.  Marland Kitchen atorvastatin (LIPITOR) 40 MG tablet Take 40 mg alt with 20 mg daily.  Marland Kitchen buPROPion (WELLBUTRIN SR) 150 MG 12 hr tablet Take one tablet in the am & one tablet @@ 2 pm daily.  . Calcium Carbonate-Vitamin D (CALTRATE 600+D) 600-400 MG-UNIT per tablet Take 2 tablets by mouth daily.    . Cetirizine HCl (ZYRTEC ALLERGY) 10 MG CAPS Take 10 mg by mouth daily.  . clindamycin (CLEOCIN) 300 MG capsule 2 CAPSULES ONE HOUR BEFORE DENTAL PROCEDURE  . diclofenac sodium (VOLTAREN) 1 % GEL Apply 1 application topically as needed.  Marland Kitchen ibuprofen (ADVIL) 200 MG tablet Take 200 mg by mouth every 6 (six) hours as needed for pain.  Marland Kitchen lisinopril (PRINIVIL,ZESTRIL) 10 MG tablet Take 1 tablet (10 mg total) by mouth daily.  . methocarbamol (ROBAXIN) 750 MG tablet Take 1 tablet (750 mg total) by mouth 3 (three) times daily as needed for muscle spasms.  . nitroGLYCERIN (NITROSTAT) 0.4 MG SL tablet Place 1 tablet (0.4 mg total) under the tongue every 5 (five) minutes as needed.  . pantoprazole (PROTONIX) 40 MG tablet Take 1 tablet (40 mg total) by mouth  daily.  Marland Kitchen PARoxetine (PAXIL-CR) 25 MG 24 hr tablet Take 1 tablet (25 mg total) by mouth every morning.  Marland Kitchen PLAVIX 75 MG tablet TAKE 1 TABLET (75 MG TOTAL) BY MOUTH DAILY.  Marland Kitchen QUEtiapine (SEROQUEL) 25 MG tablet Take 50 mg by mouth at bedtime.  . mometasone (NASONEX) 50 MCG/ACT nasal spray Place 2 sprays into the nose as needed.  . naproxen (NAPROSYN) 500 MG tablet Take 500 mg by mouth as needed.  . predniSONE (DELTASONE) 10 MG tablet Take 60 mg (6 tablets) on the first day then decrease by 10 mg (1 tablet) daily until done.  . [DISCONTINUED] atorvastatin (  LIPITOR) 40 MG tablet TAKE 1 TABLET (40 MG TOTAL) BY MOUTH DAILY.  . [DISCONTINUED] LIPITOR 40 MG tablet TAKE 1 TABLET (40 MG TOTAL) BY MOUTH DAILY.     Orders Placed This Encounter  Procedures  . TSH  . Ferritin  . Iron and TIBC  . Comp Met (CMET)  . Vitamin B12  . Folate RBC    No Follow-up on file.

## 2014-01-18 NOTE — Progress Notes (Signed)
Pre-visit discussion using our clinic review tool. No additional management support is needed unless otherwise documented below in the visit note.  

## 2014-01-18 NOTE — Patient Instructions (Addendum)
We will suspend protonix for a few months to see if you have reflux. You can continue to use the muscle relaxer     If your neck pain worsens in one week after the prednisone has worn off you may resume naproxen 500 mg twice daily and you can use tylenol  Up to 2000 mg daily (500 mg every 6 hours)   We are checking iron  Studies  And thyroid today   This is  One version of a  "Low GI"  Diet:  It will still lower your blood sugars and allow you to lose 4 to 8  lbs  per month if you follow it carefully.  Your goal with exercise is a minimum of 30 minutes of aerobic exercise 5 days per week (Walking does not count once it becomes easy!)    All of the foods can be found at grocery stores and in bulk at [employer].  The Atkins protein bars and shakes are available in more varieties at [employer], [employer] and [place].     7 AM Breakfast:  Choose from the following:  Low carbohydrate Protein  Shakes (I recommend the EAS AdvantEdge "Carb Control" shakes  Or the low carb shakes by Atkins.    2.5 carbs   Arnold's "Sandwhich Thin"toasted  w/ peanut butter (no jelly: about 20 net carbs  "Bagel Thin" with cream cheese and salmon: about 20 carbs   a scrambled egg/bacon/cheese burrito made with Mission's "carb balance" whole wheat tortilla  (about 10 net carbs )   Avoid cereal and bananas, oatmeal and cream of wheat and grits. They are loaded with carbohydrates!   10 AM: high protein snack  Protein bar by Atkins (the snack size, under 200 cal, usually < 6 net carbs).    A stick of cheese:  Around 1 carb,  100 cal     Dannon Light n Fit [place] Yogurt  (80 cal, 8 carbs)  Other so called "protein bars" and Greek yogurts tend to be loaded with carbohydrates.  Remember, in food advertising, the word "energy" is synonymous for " carbohydrate."  Lunch:   A Sandwich using the bread choices listed, Can use any  Eggs,  lunchmeat, grilled meat or canned tuna), avocado, regular mayo/mustard  and cheese.  A Salad  using blue cheese, ranch,  Goddess or vinagrette,  No croutons or "confetti" and no "candied nuts" but regular nuts OK.   No pretzels or chips.  Pickles and miniature sweet peppers are a good low carb alternative that provide a "crunch"  The bread is the only source of carbohydrate in a sandwich and  can be decreased by trying some of these alternatives to traditional loaf bread  Joseph's makes a pita bread and a flat bread that are 50 cal and 4 net carbs available at [place] and [place].  This can be toasted to use with hummous as well  Toufayan makes a low carb flatbread that's 100 cal and 9 net carbs available at [employer] and [employer]J's makes 2 sizes of  Low carb whole wheat tortilla  (The large one is 210 cal and 6 net carbs) Avoid "Low fat dressings, as well as Barry Brunner and [place] dressings They are loaded with sugar!   3 PM/ Mid day  Snack:  Consider  1 ounce of  almonds, walnuts, pistachios, pecans, peanuts,  Macadamia nuts or a nut medley.  Avoid "granola"; the dried cranberries and raisins are loaded with carbohydrates. Mixed  nuts as long as there are no raisins,  cranberries or dried fruit.    Try the prosciutto/mozzarella cheese sticks by Fiorruci  In deli /backery section   High protein      6 PM  Dinner:     Meat/fowl/fish with a green salad, and either broccoli, cauliflower, green beans, spinach, brussel sprouts or  Lima beans. DO NOT BREAD THE PROTEIN!!      There is a low carb pasta by Dreamfield's that is acceptable and tastes great: only 5 digestible carbs/serving.( All grocery stores but BJs carry it )  Try Hurley Cisco Angelo's chicken piccata or chicken or eggplant parm over low carb pasta.(Lowes and BJs)   Marjory Lies Sanchez's "Carnitas" (pulled pork, no sauce,  0 carbs) or his beef pot roast to make a dinner burrito (at BJ's)  Pesto over low carb pasta (bj's sells a good quality pesto in the center refrigerated section of the deli   Try satueeing  Cheral Marker with  mushroooms  Whole wheat pasta is still full of digestible carbs and  Not as low in glycemic index as Dreamfield's.   Brown rice is still rice,  So skip the rice and noodles if you eat [place] or [place] (or at least limit to 1/2 cup)  9 PM snack :   Breyer's "low carb" fudgsicle or  ice cream bar (Carb Smart line), or  Weight Watcher's ice cream bar , or another "no sugar added" ice cream;  a serving of fresh berries/cherries with whipped cream   Cheese or DANNON'S LlGHT N FIT G[place] YOGURT  8 ounces of [place] unsweetened almond/cococunut milk    Avoid bananas, pineapple, grapes  and watermelon on a regular basis because they are high in sugar.  THINK OF THEM AS DESSERT  Remember that snack Substitutions should be less than 10 NET carbs per serving and meals < 20 carbs. Remember to subtract fiber grams to get the "net carbs."

## 2014-01-19 ENCOUNTER — Encounter: Payer: Self-pay | Admitting: Internal Medicine

## 2014-01-19 DIAGNOSIS — M542 Cervicalgia: Secondary | ICD-10-CM | POA: Insufficient documentation

## 2014-01-19 DIAGNOSIS — F331 Major depressive disorder, recurrent, moderate: Secondary | ICD-10-CM | POA: Diagnosis not present

## 2014-01-19 LAB — FOLATE RBC: RBC Folate: 952 ng/mL (ref >280)

## 2014-01-19 NOTE — Assessment & Plan Note (Signed)
His mild drop in hgb is asymptomatic.  There is no sign of  Concurrent iron , b12 or folate deficiency

## 2014-01-19 NOTE — Assessment & Plan Note (Signed)
I have addressed  BMI and recommended wt loss of 10% of body weigh over the next 6 months using a low glycemic index diet and regular exercise a minimum of 5 days per week.   

## 2014-01-19 NOTE — Assessment & Plan Note (Signed)
Aggravated by drumming,  Now improved.  May resume naproxen and PPI

## 2014-01-19 NOTE — Assessment & Plan Note (Signed)
Diagnosed by sleep study. he is not wearing his CPAP consistently.  Discussed the risk of untreated sleep apnea and advised patient to increase compliance .

## 2014-01-20 ENCOUNTER — Encounter: Payer: Self-pay | Admitting: *Deleted

## 2014-02-01 DIAGNOSIS — F331 Major depressive disorder, recurrent, moderate: Secondary | ICD-10-CM | POA: Diagnosis not present

## 2014-02-15 DIAGNOSIS — F331 Major depressive disorder, recurrent, moderate: Secondary | ICD-10-CM | POA: Diagnosis not present

## 2014-02-16 ENCOUNTER — Other Ambulatory Visit: Payer: Self-pay | Admitting: Cardiovascular Disease

## 2014-03-14 DIAGNOSIS — F411 Generalized anxiety disorder: Secondary | ICD-10-CM | POA: Diagnosis not present

## 2014-03-15 DIAGNOSIS — F331 Major depressive disorder, recurrent, moderate: Secondary | ICD-10-CM | POA: Diagnosis not present

## 2014-03-29 DIAGNOSIS — F331 Major depressive disorder, recurrent, moderate: Secondary | ICD-10-CM | POA: Diagnosis not present

## 2014-05-12 ENCOUNTER — Other Ambulatory Visit: Payer: Self-pay | Admitting: Internal Medicine

## 2014-06-07 ENCOUNTER — Encounter: Payer: Self-pay | Admitting: Internal Medicine

## 2014-06-07 ENCOUNTER — Other Ambulatory Visit: Payer: Self-pay | Admitting: *Deleted

## 2014-06-07 ENCOUNTER — Ambulatory Visit (INDEPENDENT_AMBULATORY_CARE_PROVIDER_SITE_OTHER): Payer: Medicare Other | Admitting: Internal Medicine

## 2014-06-07 VITALS — BP 132/78 | HR 73 | Temp 98.2°F | Resp 16 | Ht 63.0 in | Wt 191.2 lb

## 2014-06-07 DIAGNOSIS — R4189 Other symptoms and signs involving cognitive functions and awareness: Secondary | ICD-10-CM

## 2014-06-07 DIAGNOSIS — F411 Generalized anxiety disorder: Secondary | ICD-10-CM

## 2014-06-07 DIAGNOSIS — Z23 Encounter for immunization: Secondary | ICD-10-CM

## 2014-06-07 DIAGNOSIS — R419 Unspecified symptoms and signs involving cognitive functions and awareness: Secondary | ICD-10-CM

## 2014-06-07 DIAGNOSIS — G252 Other specified forms of tremor: Secondary | ICD-10-CM | POA: Diagnosis not present

## 2014-06-07 DIAGNOSIS — M5430 Sciatica, unspecified side: Secondary | ICD-10-CM

## 2014-06-07 DIAGNOSIS — I2510 Atherosclerotic heart disease of native coronary artery without angina pectoris: Secondary | ICD-10-CM

## 2014-06-07 DIAGNOSIS — G4733 Obstructive sleep apnea (adult) (pediatric): Secondary | ICD-10-CM | POA: Diagnosis not present

## 2014-06-07 DIAGNOSIS — G250 Essential tremor: Secondary | ICD-10-CM | POA: Diagnosis not present

## 2014-06-07 DIAGNOSIS — M5432 Sciatica, left side: Secondary | ICD-10-CM

## 2014-06-07 DIAGNOSIS — Z9989 Dependence on other enabling machines and devices: Secondary | ICD-10-CM

## 2014-06-07 MED ORDER — ATENOLOL 25 MG PO TABS: ORAL_TABLET | ORAL | Status: DC

## 2014-06-07 NOTE — Assessment & Plan Note (Signed)
diagnosed with prior sleep study but treatment has been intermittent  by patient.  Discussed the long term history of OSA , the risks of long term damage to heart and the signs and symptoms attributable to OSA.  Advised patient to consider  significant weight loss and/or use of CPAP.

## 2014-06-07 NOTE — Progress Notes (Signed)
Patient ID: Edwin Woodward, male   DOB: 09/16/1950, 63 y.o.   MRN: [id number]   Patient Active Problem List   Diagnosis Date Noted  . Intention tremor 06/07/2014  . Cognitive complaints with normal exam 06/07/2014  . Left sided sciatica 06/07/2014  . Musculoskeletal neck pain 01/19/2014  . Allergic rhinitis 12/04/2013  . Thalassemia 08/16/2011  . Tendonitis of wrist, right 08/16/2011  . GAD (generalized anxiety disorder) 05/14/2011  . Obesity 04/11/2011  . OSA on CPAP 04/11/2011  . HYPERLIPIDEMIA-MIXED 11/22/2009  . HYPERTENSION, BENIGN 11/22/2009  . CAD, NATIVE VESSEL 11/22/2009    Subjective:  CC:   Chief Complaint  Patient presents with  . Follow-up    Saw NP for allergies, Patient staed he is having sciatic pain and goes into arch of foot on left side    HPI:   Edwin Woodward is a 63 y.o. male who presents for  Follow up multiple chronic and acute issues  1) Back pain with radiculopathy: He has DDD involving three lumbar disks  With episodes of exacerbation and radiculopathy.  Has been having increased low back pain with radiation to the left foot on and off for a week,  Improves when he ices his back .  Worried that his foot pain was due to shingles.  No blisters,  Has  pain and numbness in the left leg. Usually takes aleve or advil but has not taken any this week.    His chronic DDD is treated with manipulation and ice, also has d 3 cervical disks .  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 we discussed gabapentin and MRI  Wants to hold off on both for now  2) Tremor:  Getting worse.   Tremor is worse with outstretched hands not present at rest. History of  Carpal tunnel,  Not operable due to prior surgeries , and cervical DDD.    Tremor not present with drumming,  Only with signing the name . 2 uncles with Parkinson's disease. Worried he may be getting it.   3) allergic rhinitis ,  Ears popping and congestion.  Using nasonex ,  Stopped the zyrtec .   Prednisone  makes him too jittery   4) OSA:  Continues to use his  CPAP intermittently despite prior discussion of the  L/T effects of untreated OSA on his heart.  5) Word finding difficulty:  Describes frequent episodes of 'drawing a blank" while having a conversation.  Not getting lost or forgetting appts.  Worried about getting dementia.    Past Medical History  Diagnosis Date  . CAD (coronary artery disease)   . Presence of bare metal stent in right coronary artery     proximal RCA adn LAD in April 2009  . Hyperlipidemia   . Hypertension   . Cardiovascular stress test abnormal     Jan 2010 no significant ischemia  . Trigger finger     Past Surgical History  Procedure Laterality Date  . Replacement total knee    . Coronary artery bypass graft      2 vessel  . Joint replacement      total knee  . Appendectomy    . Carpal tunnel release    . Vasectomy    . Coronary angioplasty  12/2007  . Appendectomy    . Tonsillectomy    . Cataract extraction      right       The following portions of the patient's history were reviewed and updated  as appropriate: Allergies, current medications, and problem list.    Review of Systems:   Patient denies headache, fevers, malaise, unintentional weight loss, skin rash, eye pain, sinus congestion and sinus pain, sore throat, dysphagia,  hemoptysis , cough, dyspnea, wheezing, chest pain, palpitations, orthopnea, edema, abdominal pain, nausea, melena, diarrhea, constipation, flank pain, dysuria, hematuria, urinary  Frequency, nocturia, numbness, tingling, seizures,  Focal weakness, Loss of consciousness,  Tremor, insomnia, depression, anxiety, and suicidal ideation.     History   Social History  . Marital Status: Married    Spouse Name: N/A    Number of Children: N/A  . Years of Education: N/A   Occupational History  . Not on file.   Social History Main Topics  . Smoking status: Former Research scientist (life sciences)  . Smokeless tobacco: Never Used   . Alcohol Use: No  . Drug Use: Yes    Special: Marijuana  . Sexual Activity: Not on file   Other Topics Concern  . Not on file   Social History Narrative   Lives with wife.    Objective:  Filed Vitals:   06/07/14 1051  BP: 132/78  Pulse: 73  Temp: 98.2 F (36.8 C)  Resp: 16     General appearance: alert, cooperative and appears stated age Ears: normal TM's and external ear canals both ears Throat: lips, mucosa, and tongue normal; teeth and gums normal Neck: no adenopathy, no carotid bruit, supple, symmetrical, trachea midline and thyroid not enlarged, symmetric, no tenderness/mass/nodules Back: symmetric, no curvature. ROM normal. No CVA tenderness. Lungs: clear to auscultation bilaterally Heart: regular rate and rhythm, S1, S2 normal, no murmur, click, rub or gallop Abdomen: soft, non-tender; bowel sounds normal; no masses,  no organomegaly Pulses: 2+ and symmetric Skin: Skin color, texture, turgor normal. No rashes or lesions Lymph nodes: Cervical, supraclavicular, and axillary nodes normal.  Assessment and Plan:  Intention tremor He does not have the pill rolling tremor , the masked facies,  Or bradykinesia characteristic of PD.  His tremor appears to be aggravated by stretching out his arms which may be due to muscle weakness imposed by his cervical DDD.Marland Kitchen  Referral to neurology offered but deferred for now.   GAD (generalized anxiety disorder) He remains very anxious in affect despite management by psychiatry.   OSA on CPAP diagnosed with prior sleep study but treatment has been intermittent  by patient.  Discussed the long term history of OSA , the risks of long term damage to heart and the signs and symptoms attributable to OSA.  Advised patient to consider  significant weight loss and/or use of CPAP.   Cognitive complaints with normal exam MMSE done today.  His deficits appear to be due to "freezing" under pressure. Reassurance provided.   Left sided  sciatica Reassurance provided that he does not appear to have shingles.  Treatment with prednisone taper ad gabapentin offered but deferred.   A total of 40 minutes was spent with patient more than half of which was spent in counseling patient on the above mentioned issues  and coordination of care.  Updated Medication List Outpatient Encounter Prescriptions as of 06/07/2014  Medication Sig  . alfuzosin (UROXATRAL) 10 MG 24 hr tablet Take 10 mg by mouth daily.    Marland Kitchen ALPRAZolam (XANAX XR) 1 MG 24 hr tablet Take 1 tablet (1 mg total) by mouth every morning.  Marland Kitchen aspirin 81 MG EC tablet Take 81 mg by mouth daily.    Marland Kitchen atorvastatin (LIPITOR) 40 MG tablet Take 40  mg alt with 20 mg daily.  Marland Kitchen buPROPion (WELLBUTRIN SR) 150 MG 12 hr tablet Take one tablet in the am & one tablet @@ 2 pm daily.  . Calcium Carbonate-Vitamin D (CALTRATE 600+D) 600-400 MG-UNIT per tablet Take 2 tablets by mouth daily.    . Cetirizine HCl (ZYRTEC ALLERGY) 10 MG CAPS Take 10 mg by mouth daily.  . clindamycin (CLEOCIN) 300 MG capsule 2 CAPSULES ONE HOUR BEFORE DENTAL PROCEDURE  . diclofenac sodium (VOLTAREN) 1 % GEL Apply 1 application topically as needed.  Marland Kitchen ibuprofen (ADVIL) 200 MG tablet Take 200 mg by mouth every 6 (six) hours as needed for pain.  Marland Kitchen lisinopril (PRINIVIL,ZESTRIL) 10 MG tablet Take 1 tablet (10 mg total) by mouth daily.  . methocarbamol (ROBAXIN) 750 MG tablet Take 1 tablet (750 mg total) by mouth 3 (three) times daily as needed for muscle spasms.  . naproxen (NAPROSYN) 500 MG tablet Take 500 mg by mouth as needed.  Marland Kitchen NASONEX 50 MCG/ACT nasal spray USE 2 SPRAYS IN EACH NOSTRIL DAILY  . nitroGLYCERIN (NITROSTAT) 0.4 MG SL tablet Place 1 tablet (0.4 mg total) under the tongue every 5 (five) minutes as needed.  . pantoprazole (PROTONIX) 40 MG tablet Take 1 tablet (40 mg total) by mouth daily.  Marland Kitchen PARoxetine (PAXIL-CR) 25 MG 24 hr tablet Take 1 tablet (25 mg total) by mouth every morning.  Marland Kitchen PLAVIX 75 MG tablet  TAKE 1 TABLET (75 MG TOTAL) BY MOUTH DAILY.  Marland Kitchen QUEtiapine (SEROQUEL) 25 MG tablet Take 50 mg by mouth at bedtime.  . [DISCONTINUED] atenolol (TENORMIN) 25 MG tablet TAKE 1/2 TABLET BY MOUTH DAILY.  . [DISCONTINUED] predniSONE (DELTASONE) 10 MG tablet Take 60 mg (6 tablets) on the first day then decrease by 10 mg (1 tablet) daily until done.  . mometasone (NASONEX) 50 MCG/ACT nasal spray Place 2 sprays into the nose as needed.     No orders of the defined types were placed in this encounter.    No Follow-up on file.

## 2014-06-07 NOTE — Patient Instructions (Addendum)
1) You can add Afrin nasal spray  1 squirt each side twice daily.  Continue the nasonex too once a day .    You need both  Medications to prevent getting addicted to the [place] .  The Afrin can be used as needed,  The nasonex should be continued indefinitely     2) The foot pain is NOT shingles.  It's coming from your back.  If the foot pain and back pain doesn't improve in 2 weeks  We should consider MRI of your lower spine     3)  Your tremor is NOT the  tremor of Parkinson's Disease  4) Your memory issues are very mild and do NOT suggest that you are developing dementia

## 2014-06-07 NOTE — Assessment & Plan Note (Signed)
He does not have the pill rolling tremor , the masked facies,  Or bradykinesia characteristic of PD.  His tremor appears to be aggravated by stretching out his arms which may be due to muscle weakness imposed by his cervical DDD.Marland Kitchen  Referral to neurology offered but deferred for now.

## 2014-06-07 NOTE — Progress Notes (Signed)
Pre-visit discussion using our clinic review tool. No additional management support is needed unless otherwise documented below in the visit note.  

## 2014-06-07 NOTE — Assessment & Plan Note (Signed)
Reassurance provided that he does not appear to have shingles.  Treatment with prednisone taper ad gabapentin offered but deferred.

## 2014-06-07 NOTE — Assessment & Plan Note (Signed)
He remains very anxious in affect despite management by psychiatry.

## 2014-06-07 NOTE — Assessment & Plan Note (Signed)
MMSE done today.  His deficits appear to be due to "freezing" under pressure. Reassurance provided.

## 2014-06-13 ENCOUNTER — Telehealth: Payer: Self-pay | Admitting: Internal Medicine

## 2014-06-13 NOTE — Telephone Encounter (Signed)
Notified patient ready for pick up and placed at front desk.

## 2014-06-13 NOTE — Telephone Encounter (Signed)
Patient brought in handi-cap parking placard form to be signed placed in quick sign.

## 2014-07-11 ENCOUNTER — Other Ambulatory Visit: Payer: Self-pay | Admitting: Internal Medicine

## 2014-07-21 ENCOUNTER — Ambulatory Visit (INDEPENDENT_AMBULATORY_CARE_PROVIDER_SITE_OTHER): Payer: Medicare Other | Admitting: Internal Medicine

## 2014-07-21 ENCOUNTER — Encounter: Payer: Self-pay | Admitting: Internal Medicine

## 2014-07-21 VITALS — BP 142/86 | HR 88 | Temp 98.2°F | Wt 188.0 lb

## 2014-07-21 DIAGNOSIS — I2510 Atherosclerotic heart disease of native coronary artery without angina pectoris: Secondary | ICD-10-CM | POA: Diagnosis not present

## 2014-07-21 DIAGNOSIS — M25571 Pain in right ankle and joints of right foot: Secondary | ICD-10-CM | POA: Diagnosis not present

## 2014-07-21 DIAGNOSIS — M25572 Pain in left ankle and joints of left foot: Secondary | ICD-10-CM | POA: Diagnosis not present

## 2014-07-21 DIAGNOSIS — M79672 Pain in left foot: Secondary | ICD-10-CM

## 2014-07-21 LAB — URIC ACID: URIC ACID, SERUM: 5.2 mg/dL (ref 4.0–7.8)

## 2014-07-21 NOTE — Progress Notes (Signed)
Pre visit review using our clinic review tool, if applicable. No additional management support is needed unless otherwise documented below in the visit note. 

## 2014-07-21 NOTE — Progress Notes (Signed)
Subjective:    Patient ID: Edwin Woodward, male    DOB: 05-01-1951, 63 y.o.   MRN: [id number]  HPI   Pt presents to the clinic today with c/o left foot pain.  Pt states the pain started 4-5 days ago.  Pt denies any injury to the foot, fall from great height or previous injury.  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  Pt has an extensive orthopedic history including b/l knee replacements.  Pt is able to put minimal weight on the foot.  Pt denies any radiation of the pain, with it staying in the bottom of his foot, heel area and ankle joint.  Pt denies fever, n/v, weakness or tingling.   Review of Systems  Past Medical History  Diagnosis Date  . CAD (coronary artery disease)   . Presence of bare metal stent in right coronary artery     proximal RCA adn LAD in April 2009  . Hyperlipidemia   . Hypertension   . Cardiovascular stress test abnormal     Jan 2010 no significant ischemia  . Trigger finger     Current Outpatient Prescriptions  Medication Sig Dispense Refill  . alfuzosin (UROXATRAL) 10 MG 24 hr tablet Take 10 mg by mouth daily.      Marland Kitchen ALPRAZolam (XANAX XR) 1 MG 24 hr tablet Take 1 tablet (1 mg total) by mouth every morning. 30 tablet 5  . aspirin 81 MG EC tablet Take 81 mg by mouth daily.      Marland Kitchen atenolol (TENORMIN) 25 MG tablet TAKE 1/2 TABLET BY MOUTH DAILY. 30 tablet 3  . atorvastatin (LIPITOR) 40 MG tablet Take 40 mg alt with 20 mg daily.    Marland Kitchen buPROPion (WELLBUTRIN SR) 150 MG 12 hr tablet Take one tablet in the am & one tablet @@ 2 pm daily.    . Calcium Carbonate-Vitamin D (CALTRATE 600+D) 600-400 MG-UNIT per tablet Take 2 tablets by mouth daily.      . Cetirizine HCl (ZYRTEC ALLERGY) 10 MG CAPS Take 10 mg by mouth daily.    . clindamycin (CLEOCIN) 300 MG capsule 2 CAPSULES ONE HOUR BEFORE DENTAL PROCEDURE 2 capsule 0  . diclofenac sodium (VOLTAREN) 1 % GEL Apply 1 application topically as  needed.    Marland Kitchen ibuprofen (ADVIL) 200 MG tablet Take 200 mg by mouth every 6 (six) hours as needed for pain.    Marland Kitchen lisinopril (PRINIVIL,ZESTRIL) 10 MG tablet Take 1 tablet (10 mg total) by mouth daily. 90 tablet 3  . methocarbamol (ROBAXIN) 750 MG tablet Take 1 tablet (750 mg total) by mouth 3 (three) times daily as needed for muscle spasms. 45 tablet 0  . naproxen (NAPROSYN) 500 MG tablet Take 500 mg by mouth as needed.    Marland Kitchen NASONEX 50 MCG/ACT nasal spray USE 2 SPRAYS IN EACH NOSTRIL DAILY 17 g 5  . nitroGLYCERIN (NITROSTAT) 0.4 MG SL tablet Place 1 tablet (0.4 mg total) under the tongue every 5 (five) minutes as needed. 90 tablet 3  . pantoprazole (PROTONIX) 40 MG tablet TAKE 1 TABLET (40 MG TOTAL) BY MOUTH DAILY. 90 tablet 1  . PARoxetine (PAXIL-CR) 25 MG 24 hr tablet Take 1 tablet (25 mg total) by mouth every morning. 30 tablet 6  . PLAVIX 75 MG tablet TAKE 1 TABLET (75 MG TOTAL) BY MOUTH DAILY. 90 tablet 3  . QUEtiapine (SEROQUEL) 25 MG tablet  Take 50 mg by mouth at bedtime.    . mometasone (NASONEX) 50 MCG/ACT nasal spray Place 2 sprays into the nose as needed.     No current facility-administered medications for this visit.    Allergies  Allergen Reactions  . Biaxin [Clarithromycin]   . Clarithromycin   . Codeine   . Penicillins   . Promethazine     Family History  Problem Relation Age of Onset  . Hypertension Mother   . Diabetes Mother   . Thalassemia Mother   . Heart disease Father     History   Social History  . Marital Status: Married    Spouse Name: N/A    Number of Children: N/A  . Years of Education: N/A   Occupational History  . Not on file.   Social History Main Topics  . Smoking status: Former Research scientist (life sciences)  . Smokeless tobacco: Never Used  . Alcohol Use: No  . Drug Use: Yes    Special: Marijuana  . Sexual Activity: Not on file   Other Topics Concern  . Not on file   Social History Narrative   Lives with wife.    Musculoskeletal: Positive difficulty  with gait, muscle pain, joint pain and swelling. Denies decrease in range of motion.  Skin:  Positive redness.  Denies rashes, lesions or ulcercations.   No other specific complaints in a complete review of systems (except as listed in HPI above).    Objective:   Physical Exam   BP 142/86 mmHg  Pulse 88  Temp(Src) 98.2 F (36.8 C) (Oral)  Wt 188 lb (85.276 kg)  SpO2 98% Wt Readings from Last 3 Encounters:  07/21/14 188 lb (85.276 kg)  06/07/14 191 lb 4 oz (86.75 kg)  01/18/14 193 lb 4 oz (87.658 kg)    General: Appears his stated age, very anxious, very unsteady on his feet. Skin: Warm, dry and intact. No rashes, lesions or ulcerations noted. Small amount of redness noted on medial ankle. Musculoskeletal: Normal flexion, extension and rotation of the left ankle.  No signs of joint swelling.  Pt is experiencing pain with palpation of calcaneous area of left foot, medial side of arch and around the ankle region of his foot.  Pt is extremely unsteady on his feet, needed assistance to the table from his chair Neurological: Coordination is abnormal.     BMET    Component Value Date/Time   NA 140 01/18/2014 1124   NA 141 08/29/2013 1448   NA 144 05/03/2009 1308   K 3.8 01/18/2014 1124   K 4.0 05/03/2009 1308   CL 100 01/18/2014 1124   CL 107 05/03/2009 1308   CO2 29 01/18/2014 1124   CO2 27 05/03/2009 1308   GLUCOSE 81 01/18/2014 1124   GLUCOSE 98 08/29/2013 1448   GLUCOSE 105 05/03/2009 1308   BUN 12 01/18/2014 1124   BUN 13 08/29/2013 1448   BUN 14 05/03/2009 1308   CREATININE 0.9 01/18/2014 1124   CREATININE 0.8 05/03/2009 1308   CALCIUM 8.8 01/18/2014 1124   CALCIUM 8.4 05/03/2009 1308   GFRNONAA 97 08/29/2013 1448   GFRAA 112 08/29/2013 1448    Lipid Panel     Component Value Date/Time   CHOL 136 08/22/2010 2158   TRIG 84 08/29/2013 1448   HDL 39* 08/29/2013 1448   HDL 45 08/22/2010 2158   CHOLHDL 3.1 08/29/2013 1448   CHOLHDL 3.0 Ratio 08/22/2010 2158    VLDL 11 08/22/2010 2158   LDLCALC 64 08/29/2013  [place] 80 08/22/2010 2158    CBC    Component Value Date/Time   WBC 9.1 01/12/2014 1354   WBC 6.5 08/07/2009 0940   RBC 5.51 01/12/2014 1354   RBC 5.59 08/07/2009 0940   HGB 12.0* 01/12/2014 1354   HGB 12.1* 08/07/2009 0940   HCT 37.6* 01/12/2014 1354   HCT 37.6* 08/07/2009 0940   PLT 510.0* 01/12/2014 1354   PLT 218 08/07/2009 0940   MCV 68.2 Repeated and verified X2.* 01/12/2014 1354   MCV 67* 08/07/2009 0940   MCH 21.7* 08/07/2009 0940   MCHC 31.8 01/12/2014 1354   MCHC 32.3 08/07/2009 0940   RDW 16.2* 01/12/2014 1354   RDW 15.6* 08/07/2009 0940   LYMPHSABS 2.4 01/12/2014 1354   LYMPHSABS 2.0 08/07/2009 0940   MONOABS 0.6 01/12/2014 1354   EOSABS 0.1 01/12/2014 1354   EOSABS 0.2 08/07/2009 0940   BASOSABS 0.0 01/12/2014 1354   BASOSABS 0.0 08/07/2009 0940    Hgb A1C No results found for: HGBA1C    Assessment & Plan:   Left Ankle Pain  -Likely secondary to tendonitis or plantar fascitis -Told pt to purchase shoe insoles, use ice and try resting his foot -Can try rolling bottom of foot on a can for comfort  -Told pt to take scheduled ibuprofen to reduce the inflammation -Told pt to follow-up with Dr. Freddie Apley. Gilford Rile if unsteadiness does not return to normal within a few days -He wants [place] to check for gout although he has no history of this, will order uric acid level  RTC as needed or if symptoms persist or worsen -RTC if foot pain does not resolve within a week or if the pain worsens.

## 2014-07-21 NOTE — Patient Instructions (Signed)
Plantar Fasciitis  Plantar fasciitis is a common condition that causes foot pain. It is soreness (inflammation) of the band of tough fibrous tissue on the bottom of the foot that runs from the heel bone (calcaneus) to the ball of the foot. The cause of this soreness may be from excessive standing, poor fitting shoes, running on hard surfaces, being overweight, having an abnormal walk, or overuse (this is common in runners) of the painful foot or feet. It is also common in aerobic exercise dancers and ballet dancers.  SYMPTOMS   Most people with plantar fasciitis complain of:   Severe pain in the morning on the bottom of their foot especially when taking the first steps out of bed. This pain recedes after a few minutes of walking.   Severe pain is experienced also during walking following a long period of inactivity.   Pain is worse when walking barefoot or up stairs  DIAGNOSIS    Your caregiver will diagnose this condition by examining and feeling your foot.   Special tests such as X-rays of your foot, are usually not needed.  PREVENTION    Consult a sports medicine professional before beginning a new exercise program.   Walking programs offer a good workout. With walking there is a lower chance of overuse injuries common to runners. There is less impact and less jarring of the joints.   Begin all new exercise programs slowly. If problems or pain develop, decrease the amount of time or distance until you are at a comfortable level.   Wear good shoes and replace them regularly.   Stretch your foot and the heel cords at the back of the ankle (Achilles tendon) both before and after exercise.   Run or exercise on even surfaces that are not hard. For example, asphalt is better than pavement.   Do not run barefoot on hard surfaces.   If using a treadmill, vary the incline.   Do not continue to workout if you have foot or joint problems. Seek professional help if they do not improve.  HOME CARE INSTRUCTIONS     Avoid activities that cause you pain until you recover.   Use ice or cold packs on the problem or painful areas after working out.   Only take over-the-counter or prescription medicines for pain, discomfort, or fever as directed by your caregiver.   Soft shoe inserts or athletic shoes with air or gel sole cushions may be helpful.   If problems continue or become more severe, consult a sports medicine caregiver or your own health care provider. Cortisone is a potent anti-inflammatory medication that may be injected into the painful area. You can discuss this treatment with your caregiver.  MAKE SURE YOU:    Understand these instructions.   Will watch your condition.   Will get help right away if you are not doing well or get worse.  Document Released: 05/20/2001 Document Revised: 11/17/2011 Document Reviewed: 07/19/2008  ExitCare Patient Information 2015 [employer]. This information is not intended to replace advice given to you by your health care provider. Make sure you discuss any questions you have with your health care provider.

## 2014-07-21 NOTE — Progress Notes (Signed)
Subjective:    Patient ID: Edwin Woodward, male    DOB: [birth year], 63 y.o.   MRN: [id number]  HPI   Pt presents to the clinic today with c/o left foot pain.  Pt states the pain started 4-5 days ago.  Pt denies any injury to the foot, fall from great height or previous injury.  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  Pt has an extensive orthopedic history including b/l knee replacements.  Pt is able to put minimal weight on the foot.  Pt denies any radiation of the pain, with it staying in the bottom of his foot, heel area and ankle joint.  Pt denies fever, n/v, weakness or tingling.   Review of Systems  Past Medical History  Diagnosis Date  . CAD (coronary artery disease)   . Presence of bare metal stent in right coronary artery     proximal RCA adn LAD in April 2009  . Hyperlipidemia   . Hypertension   . Cardiovascular stress test abnormal     Jan 2010 no significant ischemia  . Trigger finger     Current Outpatient Prescriptions  Medication Sig Dispense Refill  . alfuzosin (UROXATRAL) 10 MG 24 hr tablet Take 10 mg by mouth daily.      Marland Kitchen ALPRAZolam (XANAX XR) 1 MG 24 hr tablet Take 1 tablet (1 mg total) by mouth every morning. 30 tablet 5  . aspirin 81 MG EC tablet Take 81 mg by mouth daily.      Marland Kitchen atenolol (TENORMIN) 25 MG tablet TAKE 1/2 TABLET BY MOUTH DAILY. 30 tablet 3  . atorvastatin (LIPITOR) 40 MG tablet Take 40 mg alt with 20 mg daily.    Marland Kitchen buPROPion (WELLBUTRIN SR) 150 MG 12 hr tablet Take one tablet in the am & one tablet @@ 2 pm daily.    . Calcium Carbonate-Vitamin D (CALTRATE 600+D) 600-400 MG-UNIT per tablet Take 2 tablets by mouth daily.      . Cetirizine HCl (ZYRTEC ALLERGY) 10 MG CAPS Take 10 mg by mouth daily.    . clindamycin (CLEOCIN) 300 MG capsule 2 CAPSULES ONE HOUR BEFORE DENTAL PROCEDURE 2 capsule 0  . diclofenac sodium (VOLTAREN) 1 % GEL Apply 1 application topically as  needed.    Marland Kitchen ibuprofen (ADVIL) 200 MG tablet Take 200 mg by mouth every 6 (six) hours as needed for pain.    Marland Kitchen lisinopril (PRINIVIL,ZESTRIL) 10 MG tablet Take 1 tablet (10 mg total) by mouth daily. 90 tablet 3  . methocarbamol (ROBAXIN) 750 MG tablet Take 1 tablet (750 mg total) by mouth 3 (three) times daily as needed for muscle spasms. 45 tablet 0  . naproxen (NAPROSYN) 500 MG tablet Take 500 mg by mouth as needed.    Marland Kitchen NASONEX 50 MCG/ACT nasal spray USE 2 SPRAYS IN EACH NOSTRIL DAILY 17 g 5  . nitroGLYCERIN (NITROSTAT) 0.4 MG SL tablet Place 1 tablet (0.4 mg total) under the tongue every 5 (five) minutes as needed. 90 tablet 3  . pantoprazole (PROTONIX) 40 MG tablet TAKE 1 TABLET (40 MG TOTAL) BY MOUTH DAILY. 90 tablet 1  . PARoxetine (PAXIL-CR) 25 MG 24 hr tablet Take 1 tablet (25 mg total) by mouth every morning. 30 tablet 6  . PLAVIX 75 MG tablet TAKE 1 TABLET (75 MG TOTAL) BY MOUTH DAILY. 90 tablet 3  . QUEtiapine (SEROQUEL) 25 MG tablet  Take 50 mg by mouth at bedtime.    . mometasone (NASONEX) 50 MCG/ACT nasal spray Place 2 sprays into the nose as needed.     No current facility-administered medications for this visit.    Allergies  Allergen Reactions  . Biaxin [Clarithromycin]   . Clarithromycin   . Codeine   . Penicillins   . Promethazine     Family History  Problem Relation Age of Onset  . Hypertension Mother   . Diabetes Mother   . Thalassemia Mother   . Heart disease Father     History   Social History  . Marital Status: Married    Spouse Name: N/A    Number of Children: N/A  . Years of Education: N/A   Occupational History  . Not on file.   Social History Main Topics  . Smoking status: Former Research scientist (life sciences)  . Smokeless tobacco: Never Used  . Alcohol Use: No  . Drug Use: Yes    Special: Marijuana  . Sexual Activity: Not on file   Other Topics Concern  . Not on file   Social History Narrative   Lives with wife.    Musculoskeletal: Positive difficulty  with gait, muscle pain, joint pain and swelling. Denies decrease in range of motion.  Skin:  Positive redness.  Denies rashes, lesions or ulcercations.   No other specific complaints in a complete review of systems (except as listed in HPI above).    Objective:   Physical Exam   BP 142/86 mmHg  Pulse 88  Temp(Src) 98.2 F (36.8 C) (Oral)  Wt 188 lb (85.276 kg)  SpO2 98% Wt Readings from Last 3 Encounters:  07/21/14 188 lb (85.276 kg)  06/07/14 191 lb 4 oz (86.75 kg)  01/18/14 193 lb 4 oz (87.658 kg)    General: Appears his stated age, very anxious, very unsteady on his feet. Skin: Warm, dry and intact. No rashes, lesions or ulcerations noted. Small amount of redness noted on medial ankle. Musculoskeletal: Normal flexion, extension and rotation of the left ankle.  No signs of joint swelling.  Pt is experiencing pain with palpation of calcaneous area of left foot, medial side of arch and around the ankle region of his foot.  Pt is extremely unsteady on his feet, needed assistance to the table from his chair Neurological: Coordination is abnormal.     BMET    Component Value Date/Time   NA 140 01/18/2014 1124   NA 141 08/29/2013 1448   NA 144 05/03/2009 1308   K 3.8 01/18/2014 1124   K 4.0 05/03/2009 1308   CL 100 01/18/2014 1124   CL 107 05/03/2009 1308   CO2 29 01/18/2014 1124   CO2 27 05/03/2009 1308   GLUCOSE 81 01/18/2014 1124   GLUCOSE 98 08/29/2013 1448   GLUCOSE 105 05/03/2009 1308   BUN 12 01/18/2014 1124   BUN 13 08/29/2013 1448   BUN 14 05/03/2009 1308   CREATININE 0.9 01/18/2014 1124   CREATININE 0.8 05/03/2009 1308   CALCIUM 8.8 01/18/2014 1124   CALCIUM 8.4 05/03/2009 1308   GFRNONAA 97 08/29/2013 1448   GFRAA 112 08/29/2013 1448    Lipid Panel     Component Value Date/Time   CHOL 136 08/22/2010 2158   TRIG 84 08/29/2013 1448   HDL 39* 08/29/2013 1448   HDL 45 08/22/2010 2158   CHOLHDL 3.1 08/29/2013 1448   CHOLHDL 3.0 Ratio 08/22/2010 2158    VLDL 11 08/22/2010 2158   LDLCALC 64 08/29/2013  [facility] [place] [facility] 80 08/22/2010 2158    CBC    Component Value Date/Time   WBC 9.1 01/12/2014 1354   WBC 6.5 08/07/2009 0940   RBC 5.51 01/12/2014 1354   RBC 5.59 08/07/2009 0940   HGB 12.0* 01/12/2014 1354   HGB 12.1* 08/07/2009 0940   HCT 37.6* 01/12/2014 1354   HCT 37.6* 08/07/2009 0940   PLT 510.0* 01/12/2014 1354   PLT 218 08/07/2009 0940   MCV 68.2 Repeated and verified X2.* 01/12/2014 1354   MCV 67* 08/07/2009 0940   MCH 21.7* 08/07/2009 0940   MCHC 31.8 01/12/2014 1354   MCHC 32.3 08/07/2009 0940   RDW 16.2* 01/12/2014 1354   RDW 15.6* 08/07/2009 0940   LYMPHSABS 2.4 01/12/2014 1354   LYMPHSABS 2.0 08/07/2009 0940   MONOABS 0.6 01/12/2014 1354   EOSABS 0.1 01/12/2014 1354   EOSABS 0.2 08/07/2009 0940   BASOSABS 0.0 01/12/2014 1354   BASOSABS 0.0 08/07/2009 0940    Hgb A1C No results found for: HGBA1C    Assessment & Plan:   Left Ankle Pain  -Likely secondary to tendonitis or plantar fascitis -Told pt to purchase shoe insoles, use ice and try resting his foot -Can try rolling bottom of foot on a can for comfort  -Told pt to take scheduled ibuprofen to reduce the inflammation -Told pt to follow-up with Dr. Freddie Apley. Gilford Rile if unsteadiness does not return to normal within a few days -He wants [place] to check for gout although he has no history of this, will order uric acid level  RTC as needed or if symptoms persist or worsen -RTC if foot pain does not resolve within a week or if the pain worsens.

## 2014-07-27 ENCOUNTER — Telehealth: Payer: Self-pay | Admitting: *Deleted

## 2014-07-27 NOTE — Telephone Encounter (Signed)
Pt's wife called, wanting uric acid results from 07/21/14. States she is unable to reach anyone at the [facility] office. Notified of lab results.

## 2014-07-28 DIAGNOSIS — S99922A Unspecified injury of left foot, initial encounter: Secondary | ICD-10-CM | POA: Diagnosis not present

## 2014-07-28 DIAGNOSIS — M25572 Pain in left ankle and joints of left foot: Secondary | ICD-10-CM | POA: Diagnosis not present

## 2014-07-28 DIAGNOSIS — R51 Headache: Secondary | ICD-10-CM | POA: Diagnosis not present

## 2014-07-28 DIAGNOSIS — S99912A Unspecified injury of left ankle, initial encounter: Secondary | ICD-10-CM | POA: Diagnosis not present

## 2014-07-28 DIAGNOSIS — M13872 Other specified arthritis, left ankle and foot: Secondary | ICD-10-CM | POA: Diagnosis not present

## 2014-07-28 DIAGNOSIS — E876 Hypokalemia: Secondary | ICD-10-CM | POA: Diagnosis not present

## 2014-07-28 DIAGNOSIS — D72829 Elevated white blood cell count, unspecified: Secondary | ICD-10-CM | POA: Diagnosis not present

## 2014-07-28 DIAGNOSIS — M25579 Pain in unspecified ankle and joints of unspecified foot: Secondary | ICD-10-CM | POA: Diagnosis not present

## 2014-07-28 DIAGNOSIS — M79672 Pain in left foot: Secondary | ICD-10-CM | POA: Diagnosis not present

## 2014-07-28 DIAGNOSIS — K219 Gastro-esophageal reflux disease without esophagitis: Secondary | ICD-10-CM | POA: Diagnosis not present

## 2014-07-28 DIAGNOSIS — R4182 Altered mental status, unspecified: Secondary | ICD-10-CM | POA: Diagnosis not present

## 2014-07-28 DIAGNOSIS — I2510 Atherosclerotic heart disease of native coronary artery without angina pectoris: Secondary | ICD-10-CM | POA: Diagnosis not present

## 2014-07-28 DIAGNOSIS — M1310 Monoarthritis, not elsewhere classified, unspecified site: Secondary | ICD-10-CM | POA: Diagnosis not present

## 2014-07-28 LAB — BASIC METABOLIC PANEL
Anion Gap: 9 (ref 7–16)
BUN: 15 mg/dL (ref 7–18)
CALCIUM: 8.7 mg/dL (ref 8.5–10.1)
CREATININE: 0.99 mg/dL (ref 0.60–1.30)
Chloride: 100 mmol/L (ref 98–107)
Co2: 31 mmol/L (ref 21–32)
EGFR (Non-African Amer.): >60
GLUCOSE: 93 mg/dL (ref 65–99)
OSMOLALITY: 280 (ref 275–301)
POTASSIUM: 2.8 mmol/L — AB (ref 3.5–5.1)
Sodium: 140 mmol/L (ref 136–145)

## 2014-07-28 LAB — HEPATIC FUNCTION PANEL A (ARMC)
AST: 41 U/L — AB (ref 15–37)
Albumin: 3 g/dL — ABNORMAL LOW (ref 3.4–5.0)
Alkaline Phosphatase: 76 U/L
BILIRUBIN DIRECT: 0.2 mg/dL (ref 0.0–0.2)
Bilirubin,Total: 0.9 mg/dL (ref 0.2–1.0)
SGPT (ALT): 38 U/L
Total Protein: 7.9 g/dL (ref 6.4–8.2)

## 2014-07-28 LAB — CBC WITH DIFFERENTIAL/PLATELET
Basophil #: 0 10*3/uL (ref 0.0–0.1)
Basophil %: 0.2 %
Eosinophil #: 0 10*3/uL (ref 0.0–0.7)
Eosinophil %: 0.3 %
HCT: 35.3 % — ABNORMAL LOW (ref 40.0–52.0)
HGB: 11.1 g/dL — AB (ref 13.0–18.0)
Lymphocyte #: 1.1 10*3/uL (ref 1.0–3.6)
Lymphocyte %: 8.1 %
MCH: 20.8 pg — ABNORMAL LOW (ref 26.0–34.0)
MCHC: 31.4 g/dL — ABNORMAL LOW (ref 32.0–36.0)
MCV: 66 fL — AB (ref 80–100)
MONO ABS: 1.3 x10 3/mm — AB (ref 0.2–1.0)
Monocyte %: 9.8 %
NEUTROS ABS: 10.7 10*3/uL — AB (ref 1.4–6.5)
Neutrophil %: 81.6 %
PLATELETS: 373 10*3/uL (ref 150–440)
RBC: 5.32 10*6/uL (ref 4.40–5.90)
RDW: 14.9 % — ABNORMAL HIGH (ref 11.5–14.5)
WBC: 13.1 10*3/uL — AB (ref 3.8–10.6)

## 2014-07-28 LAB — ETHANOL: Ethanol: <3 mg/dL

## 2014-07-28 LAB — URIC ACID: URIC ACID: 4.4 mg/dL (ref 3.5–7.2)

## 2014-07-28 LAB — SEDIMENTATION RATE: Erythrocyte Sed Rate: 70 mm/hr — ABNORMAL HIGH (ref 0–20)

## 2014-07-28 LAB — MAGNESIUM: Magnesium: 1.9 mg/dL

## 2014-07-29 DIAGNOSIS — M109 Gout, unspecified: Secondary | ICD-10-CM | POA: Diagnosis not present

## 2014-07-29 DIAGNOSIS — M1310 Monoarthritis, not elsewhere classified, unspecified site: Secondary | ICD-10-CM | POA: Diagnosis not present

## 2014-07-29 DIAGNOSIS — N179 Acute kidney failure, unspecified: Secondary | ICD-10-CM | POA: Diagnosis not present

## 2014-07-29 DIAGNOSIS — E876 Hypokalemia: Secondary | ICD-10-CM | POA: Diagnosis not present

## 2014-07-29 DIAGNOSIS — I2510 Atherosclerotic heart disease of native coronary artery without angina pectoris: Secondary | ICD-10-CM | POA: Diagnosis not present

## 2014-07-29 LAB — URINALYSIS, COMPLETE
BACTERIA: NONE SEEN
Bilirubin,UR: NEGATIVE
Blood: NEGATIVE
Glucose,UR: NEGATIVE mg/dL (ref 0–75)
Hyaline Cast: 2
LEUKOCYTE ESTERASE: NEGATIVE
NITRITE: NEGATIVE
Ph: 5 (ref 4.5–8.0)
Protein: 30
RBC,UR: 1 /HPF (ref 0–5)
Specific Gravity: 1.018 (ref 1.003–1.030)
Squamous Epithelial: <1
WBC UR: 7 /HPF (ref 0–5)

## 2014-07-29 LAB — CBC WITH DIFFERENTIAL/PLATELET
Basophil #: 0 10*3/uL (ref 0.0–0.1)
Basophil %: 0.3 %
EOS ABS: 0.1 10*3/uL (ref 0.0–0.7)
Eosinophil %: 1.2 %
HCT: 30.7 % — ABNORMAL LOW (ref 40.0–52.0)
HGB: 9.8 g/dL — ABNORMAL LOW (ref 13.0–18.0)
LYMPHS ABS: 2.1 10*3/uL (ref 1.0–3.6)
Lymphocyte %: 21.1 %
MCH: 21 pg — ABNORMAL LOW (ref 26.0–34.0)
MCHC: 32.1 g/dL (ref 32.0–36.0)
MCV: 66 fL — ABNORMAL LOW (ref 80–100)
Monocyte #: 1.1 x10 3/mm — ABNORMAL HIGH (ref 0.2–1.0)
Monocyte %: 11 %
NEUTROS PCT: 66.4 %
Neutrophil #: 6.5 10*3/uL (ref 1.4–6.5)
PLATELETS: 355 10*3/uL (ref 150–440)
RBC: 4.68 10*6/uL (ref 4.40–5.90)
RDW: 14.9 % — ABNORMAL HIGH (ref 11.5–14.5)
WBC: 9.7 10*3/uL (ref 3.8–10.6)

## 2014-07-29 LAB — BASIC METABOLIC PANEL
ANION GAP: 7 (ref 7–16)
BUN: 21 mg/dL — AB (ref 7–18)
Calcium, Total: 8.4 mg/dL — ABNORMAL LOW (ref 8.5–10.1)
Chloride: 103 mmol/L (ref 98–107)
Co2: 31 mmol/L (ref 21–32)
Creatinine: 1.41 mg/dL — ABNORMAL HIGH (ref 0.60–1.30)
EGFR (African American): >60
EGFR (Non-African Amer.): 54 — ABNORMAL LOW
GLUCOSE: 83 mg/dL (ref 65–99)
OSMOLALITY: 283 (ref 275–301)
Potassium: 3.1 mmol/L — ABNORMAL LOW (ref 3.5–5.1)
SODIUM: 141 mmol/L (ref 136–145)

## 2014-07-30 ENCOUNTER — Inpatient Hospital Stay: Payer: Self-pay | Admitting: Internal Medicine

## 2014-07-30 DIAGNOSIS — E785 Hyperlipidemia, unspecified: Secondary | ICD-10-CM | POA: Diagnosis present

## 2014-07-30 DIAGNOSIS — Z955 Presence of coronary angioplasty implant and graft: Secondary | ICD-10-CM | POA: Diagnosis not present

## 2014-07-30 DIAGNOSIS — R2689 Other abnormalities of gait and mobility: Secondary | ICD-10-CM | POA: Diagnosis not present

## 2014-07-30 DIAGNOSIS — R700 Elevated erythrocyte sedimentation rate: Secondary | ICD-10-CM | POA: Diagnosis not present

## 2014-07-30 DIAGNOSIS — Z7902 Long term (current) use of antithrombotics/antiplatelets: Secondary | ICD-10-CM | POA: Diagnosis not present

## 2014-07-30 DIAGNOSIS — M1310 Monoarthritis, not elsewhere classified, unspecified site: Secondary | ICD-10-CM | POA: Diagnosis not present

## 2014-07-30 DIAGNOSIS — K219 Gastro-esophageal reflux disease without esophagitis: Secondary | ICD-10-CM | POA: Diagnosis present

## 2014-07-30 DIAGNOSIS — F419 Anxiety disorder, unspecified: Secondary | ICD-10-CM | POA: Diagnosis not present

## 2014-07-30 DIAGNOSIS — Z79899 Other long term (current) drug therapy: Secondary | ICD-10-CM | POA: Diagnosis not present

## 2014-07-30 DIAGNOSIS — M47896 Other spondylosis, lumbar region: Secondary | ICD-10-CM | POA: Diagnosis present

## 2014-07-30 DIAGNOSIS — N170 Acute kidney failure with tubular necrosis: Secondary | ICD-10-CM | POA: Diagnosis present

## 2014-07-30 DIAGNOSIS — Z888 Allergy status to other drugs, medicaments and biological substances status: Secondary | ICD-10-CM | POA: Diagnosis not present

## 2014-07-30 DIAGNOSIS — M6281 Muscle weakness (generalized): Secondary | ICD-10-CM | POA: Diagnosis not present

## 2014-07-30 DIAGNOSIS — R809 Proteinuria, unspecified: Secondary | ICD-10-CM | POA: Diagnosis not present

## 2014-07-30 DIAGNOSIS — E780 Pure hypercholesterolemia: Secondary | ICD-10-CM | POA: Diagnosis present

## 2014-07-30 DIAGNOSIS — R531 Weakness: Secondary | ICD-10-CM | POA: Diagnosis not present

## 2014-07-30 DIAGNOSIS — F329 Major depressive disorder, single episode, unspecified: Secondary | ICD-10-CM | POA: Diagnosis not present

## 2014-07-30 DIAGNOSIS — R7611 Nonspecific reaction to tuberculin skin test without active tuberculosis: Secondary | ICD-10-CM | POA: Diagnosis not present

## 2014-07-30 DIAGNOSIS — Z880 Allergy status to penicillin: Secondary | ICD-10-CM | POA: Diagnosis not present

## 2014-07-30 DIAGNOSIS — M65872 Other synovitis and tenosynovitis, left ankle and foot: Secondary | ICD-10-CM | POA: Diagnosis present

## 2014-07-30 DIAGNOSIS — I10 Essential (primary) hypertension: Secondary | ICD-10-CM | POA: Diagnosis not present

## 2014-07-30 DIAGNOSIS — Z91041 Radiographic dye allergy status: Secondary | ICD-10-CM | POA: Diagnosis not present

## 2014-07-30 DIAGNOSIS — N179 Acute kidney failure, unspecified: Secondary | ICD-10-CM | POA: Diagnosis not present

## 2014-07-30 DIAGNOSIS — M25572 Pain in left ankle and joints of left foot: Secondary | ICD-10-CM | POA: Diagnosis not present

## 2014-07-30 DIAGNOSIS — M179 Osteoarthritis of knee, unspecified: Secondary | ICD-10-CM | POA: Diagnosis present

## 2014-07-30 DIAGNOSIS — I2510 Atherosclerotic heart disease of native coronary artery without angina pectoris: Secondary | ICD-10-CM | POA: Diagnosis present

## 2014-07-30 DIAGNOSIS — E876 Hypokalemia: Secondary | ICD-10-CM | POA: Diagnosis not present

## 2014-07-30 DIAGNOSIS — F418 Other specified anxiety disorders: Secondary | ICD-10-CM | POA: Diagnosis present

## 2014-07-30 DIAGNOSIS — Z8261 Family history of arthritis: Secondary | ICD-10-CM | POA: Diagnosis not present

## 2014-07-30 DIAGNOSIS — N400 Enlarged prostate without lower urinary tract symptoms: Secondary | ICD-10-CM | POA: Diagnosis present

## 2014-07-30 DIAGNOSIS — M109 Gout, unspecified: Secondary | ICD-10-CM | POA: Diagnosis present

## 2014-07-30 DIAGNOSIS — F909 Attention-deficit hyperactivity disorder, unspecified type: Secondary | ICD-10-CM | POA: Diagnosis present

## 2014-07-30 DIAGNOSIS — Z8249 Family history of ischemic heart disease and other diseases of the circulatory system: Secondary | ICD-10-CM | POA: Diagnosis not present

## 2014-07-30 DIAGNOSIS — M722 Plantar fascial fibromatosis: Secondary | ICD-10-CM | POA: Diagnosis present

## 2014-07-30 DIAGNOSIS — M47892 Other spondylosis, cervical region: Secondary | ICD-10-CM | POA: Diagnosis present

## 2014-07-30 LAB — CBC WITH DIFFERENTIAL/PLATELET
BASOS ABS: 0 10*3/uL (ref 0.0–0.1)
Basophil %: 0.1 %
EOS ABS: 0 10*3/uL (ref 0.0–0.7)
EOS PCT: 0.1 %
HCT: 29.4 % — ABNORMAL LOW (ref 40.0–52.0)
HGB: 9.5 g/dL — ABNORMAL LOW (ref 13.0–18.0)
LYMPHS PCT: 6.2 %
Lymphocyte #: 0.6 10*3/uL — ABNORMAL LOW (ref 1.0–3.6)
MCH: 21.1 pg — ABNORMAL LOW (ref 26.0–34.0)
MCHC: 32.2 g/dL (ref 32.0–36.0)
MCV: 66 fL — ABNORMAL LOW (ref 80–100)
MONOS PCT: 3.1 %
Monocyte #: 0.3 x10 3/mm (ref 0.2–1.0)
NEUTROS ABS: 8.2 10*3/uL — AB (ref 1.4–6.5)
Neutrophil %: 90.5 %
Platelet: 347 10*3/uL (ref 150–440)
RBC: 4.49 10*6/uL (ref 4.40–5.90)
RDW: 15 % — ABNORMAL HIGH (ref 11.5–14.5)
WBC: 9 10*3/uL (ref 3.8–10.6)

## 2014-07-30 LAB — BASIC METABOLIC PANEL
ANION GAP: 8 (ref 7–16)
BUN: 38 mg/dL — AB (ref 7–18)
CALCIUM: 8 mg/dL — AB (ref 8.5–10.1)
CO2: 26 mmol/L (ref 21–32)
Chloride: 109 mmol/L — ABNORMAL HIGH (ref 98–107)
Creatinine: 2.35 mg/dL — ABNORMAL HIGH (ref 0.60–1.30)
EGFR (African American): 36 — ABNORMAL LOW
EGFR (Non-African Amer.): 30 — ABNORMAL LOW
Glucose: 122 mg/dL — ABNORMAL HIGH (ref 65–99)
Osmolality: 295 (ref 275–301)
Potassium: 3.9 mmol/L (ref 3.5–5.1)
Sodium: 143 mmol/L (ref 136–145)

## 2014-07-30 LAB — URIC ACID: Uric Acid: 5.2 mg/dL (ref 3.5–7.2)

## 2014-07-31 DIAGNOSIS — E876 Hypokalemia: Secondary | ICD-10-CM | POA: Diagnosis not present

## 2014-07-31 DIAGNOSIS — I2510 Atherosclerotic heart disease of native coronary artery without angina pectoris: Secondary | ICD-10-CM | POA: Diagnosis not present

## 2014-07-31 DIAGNOSIS — M1310 Monoarthritis, not elsewhere classified, unspecified site: Secondary | ICD-10-CM | POA: Diagnosis not present

## 2014-07-31 DIAGNOSIS — M109 Gout, unspecified: Secondary | ICD-10-CM | POA: Diagnosis not present

## 2014-07-31 DIAGNOSIS — N179 Acute kidney failure, unspecified: Secondary | ICD-10-CM | POA: Diagnosis not present

## 2014-07-31 LAB — BASIC METABOLIC PANEL
Anion Gap: 10 (ref 7–16)
BUN: 52 mg/dL — AB (ref 7–18)
CO2: 23 mmol/L (ref 21–32)
CREATININE: 2.36 mg/dL — AB (ref 0.60–1.30)
Calcium, Total: 7.9 mg/dL — ABNORMAL LOW (ref 8.5–10.1)
Chloride: 112 mmol/L — ABNORMAL HIGH (ref 98–107)
EGFR (Non-African Amer.): 30 — ABNORMAL LOW
GFR CALC AF AMER: 36 — AB
GLUCOSE: 116 mg/dL — AB (ref 65–99)
OSMOLALITY: 304 (ref 275–301)
Potassium: 3.7 mmol/L (ref 3.5–5.1)
SODIUM: 145 mmol/L (ref 136–145)

## 2014-07-31 LAB — PROTEIN / CREATININE RATIO, URINE
CREATININE, URINE: 67.3 mg/dL (ref 30.0–125.0)
Protein, Random Urine: 14 mg/dL — ABNORMAL HIGH (ref 0–12)
Protein/Creat. Ratio: 208 mg/gCREAT — ABNORMAL HIGH (ref 0–200)

## 2014-08-01 DIAGNOSIS — E876 Hypokalemia: Secondary | ICD-10-CM | POA: Diagnosis not present

## 2014-08-01 DIAGNOSIS — I2510 Atherosclerotic heart disease of native coronary artery without angina pectoris: Secondary | ICD-10-CM | POA: Diagnosis not present

## 2014-08-01 DIAGNOSIS — M1310 Monoarthritis, not elsewhere classified, unspecified site: Secondary | ICD-10-CM | POA: Diagnosis not present

## 2014-08-01 DIAGNOSIS — N179 Acute kidney failure, unspecified: Secondary | ICD-10-CM | POA: Diagnosis not present

## 2014-08-01 DIAGNOSIS — M109 Gout, unspecified: Secondary | ICD-10-CM | POA: Diagnosis not present

## 2014-08-01 LAB — RENAL FUNCTION PANEL
ALBUMIN: 2.6 g/dL — AB (ref 3.4–5.0)
Anion Gap: 9 (ref 7–16)
BUN: 51 mg/dL — AB (ref 7–18)
CO2: 24 mmol/L (ref 21–32)
CREATININE: 2.4 mg/dL — AB (ref 0.60–1.30)
Calcium, Total: 7.8 mg/dL — ABNORMAL LOW (ref 8.5–10.1)
Chloride: 115 mmol/L — ABNORMAL HIGH (ref 98–107)
EGFR (Non-African Amer.): 29 — ABNORMAL LOW
GFR CALC AF AMER: 35 — AB
Glucose: 107 mg/dL — ABNORMAL HIGH (ref 65–99)
Osmolality: 308 (ref 275–301)
POTASSIUM: 3.8 mmol/L (ref 3.5–5.1)
Phosphorus: 3.8 mg/dL (ref 2.5–4.9)
SODIUM: 148 mmol/L — AB (ref 136–145)

## 2014-08-02 ENCOUNTER — Telehealth: Payer: Self-pay

## 2014-08-02 DIAGNOSIS — M1A00X0 Idiopathic chronic gout, unspecified site, without tophus (tophi): Secondary | ICD-10-CM | POA: Diagnosis not present

## 2014-08-02 DIAGNOSIS — E876 Hypokalemia: Secondary | ICD-10-CM | POA: Diagnosis not present

## 2014-08-02 DIAGNOSIS — E785 Hyperlipidemia, unspecified: Secondary | ICD-10-CM | POA: Diagnosis not present

## 2014-08-02 DIAGNOSIS — R2689 Other abnormalities of gait and mobility: Secondary | ICD-10-CM | POA: Diagnosis not present

## 2014-08-02 DIAGNOSIS — R531 Weakness: Secondary | ICD-10-CM | POA: Diagnosis not present

## 2014-08-02 DIAGNOSIS — R262 Difficulty in walking, not elsewhere classified: Secondary | ICD-10-CM | POA: Diagnosis not present

## 2014-08-02 DIAGNOSIS — M779 Enthesopathy, unspecified: Secondary | ICD-10-CM | POA: Diagnosis not present

## 2014-08-02 DIAGNOSIS — R809 Proteinuria, unspecified: Secondary | ICD-10-CM | POA: Diagnosis not present

## 2014-08-02 DIAGNOSIS — I10 Essential (primary) hypertension: Secondary | ICD-10-CM | POA: Diagnosis not present

## 2014-08-02 DIAGNOSIS — R2681 Unsteadiness on feet: Secondary | ICD-10-CM | POA: Diagnosis not present

## 2014-08-02 DIAGNOSIS — R251 Tremor, unspecified: Secondary | ICD-10-CM | POA: Diagnosis not present

## 2014-08-02 DIAGNOSIS — N179 Acute kidney failure, unspecified: Secondary | ICD-10-CM | POA: Diagnosis not present

## 2014-08-02 DIAGNOSIS — F39 Unspecified mood [affective] disorder: Secondary | ICD-10-CM | POA: Diagnosis not present

## 2014-08-02 DIAGNOSIS — M6281 Muscle weakness (generalized): Secondary | ICD-10-CM | POA: Diagnosis not present

## 2014-08-02 DIAGNOSIS — G20 Parkinson's disease: Secondary | ICD-10-CM | POA: Diagnosis not present

## 2014-08-02 DIAGNOSIS — R410 Disorientation, unspecified: Secondary | ICD-10-CM | POA: Diagnosis not present

## 2014-08-02 DIAGNOSIS — M65872 Other synovitis and tenosynovitis, left ankle and foot: Secondary | ICD-10-CM | POA: Diagnosis not present

## 2014-08-02 DIAGNOSIS — J069 Acute upper respiratory infection, unspecified: Secondary | ICD-10-CM | POA: Diagnosis not present

## 2014-08-02 DIAGNOSIS — R7611 Nonspecific reaction to tuberculin skin test without active tuberculosis: Secondary | ICD-10-CM | POA: Diagnosis not present

## 2014-08-02 DIAGNOSIS — F329 Major depressive disorder, single episode, unspecified: Secondary | ICD-10-CM | POA: Diagnosis not present

## 2014-08-02 DIAGNOSIS — M109 Gout, unspecified: Secondary | ICD-10-CM | POA: Diagnosis not present

## 2014-08-02 DIAGNOSIS — N400 Enlarged prostate without lower urinary tract symptoms: Secondary | ICD-10-CM | POA: Diagnosis not present

## 2014-08-02 DIAGNOSIS — K219 Gastro-esophageal reflux disease without esophagitis: Secondary | ICD-10-CM | POA: Diagnosis not present

## 2014-08-02 DIAGNOSIS — D649 Anemia, unspecified: Secondary | ICD-10-CM | POA: Diagnosis not present

## 2014-08-02 DIAGNOSIS — M1310 Monoarthritis, not elsewhere classified, unspecified site: Secondary | ICD-10-CM | POA: Diagnosis not present

## 2014-08-02 DIAGNOSIS — F419 Anxiety disorder, unspecified: Secondary | ICD-10-CM | POA: Diagnosis not present

## 2014-08-02 DIAGNOSIS — M150 Primary generalized (osteo)arthritis: Secondary | ICD-10-CM | POA: Diagnosis not present

## 2014-08-02 DIAGNOSIS — I2510 Atherosclerotic heart disease of native coronary artery without angina pectoris: Secondary | ICD-10-CM | POA: Diagnosis not present

## 2014-08-02 LAB — CULTURE, BLOOD (SINGLE)

## 2014-08-02 LAB — BASIC METABOLIC PANEL
Anion Gap: 9 (ref 7–16)
BUN: 43 mg/dL — ABNORMAL HIGH (ref 7–18)
CO2: 24 mmol/L (ref 21–32)
CREATININE: 2.47 mg/dL — AB (ref 0.60–1.30)
Calcium, Total: 7.8 mg/dL — ABNORMAL LOW (ref 8.5–10.1)
Chloride: 115 mmol/L — ABNORMAL HIGH (ref 98–107)
EGFR (African American): 34 — ABNORMAL LOW
EGFR (Non-African Amer.): 28 — ABNORMAL LOW
Glucose: 85 mg/dL (ref 65–99)
OSMOLALITY: 304 (ref 275–301)
POTASSIUM: 3.1 mmol/L — AB (ref 3.5–5.1)
SODIUM: 148 mmol/L — AB (ref 136–145)

## 2014-08-02 LAB — PLATELET COUNT: Platelet: 302 10*3/uL (ref 150–440)

## 2014-08-02 NOTE — Telephone Encounter (Signed)
Patient was scheduled with NP 08/08/14 for follow up. Patient wife stated she called hear on the 11th was out on hold and when she was put on hold the phone went to props that told her to leave message patient wife left message on appointment desk, community voicemail, and triage voicemail and never received a call back . Patient wife took him to [facility] he was seen by NP and the patient was treated for Plantars fascitis, patient continued to get worse,and became rebellious and foot started getting red and hot patient became bed ridden and constipated from pain medication. Was taken By ambulance to ER on 07/28/14 and admitted for gout, infection and anxiety. Please advise.

## 2014-08-02 NOTE — Telephone Encounter (Signed)
Please move and change appointment to Dr. Derrel Nip. At 10.45 for 30 min. Patient aware.

## 2014-08-02 NOTE — Telephone Encounter (Signed)
Spoke with pt's wife, calling to review hospital discharge.  Pt's wife states that pt was sent to rehab and he will be at his apt on 08/11/14

## 2014-08-02 NOTE — Telephone Encounter (Signed)
Follow up dec 4th

## 2014-08-02 NOTE — Telephone Encounter (Signed)
Patient has been rescheduled with MD on 08/11/14 Patient is going [facility] until that time. Heron Nay will transport him here. FYI He has an appointment with Nephrology also stated but could not ell me whom, say that is also having some kidney failure.

## 2014-08-02 NOTE — Telephone Encounter (Signed)
Please schedule patient for 08/11/14 with dr. Derrel Nip  Use double same day spot thirty minutes.

## 2014-08-02 NOTE — Telephone Encounter (Signed)
Patient scheduled follow up with C.Doss.  Do you want him moved to Dr.Tullo's schedule? Or keep him on with C.Doss?

## 2014-08-02 NOTE — Telephone Encounter (Signed)
Pt is being discharged from the hospital today ( 08/02/14)

## 2014-08-03 ENCOUNTER — Encounter: Payer: Self-pay | Admitting: Internal Medicine

## 2014-08-07 DIAGNOSIS — M109 Gout, unspecified: Secondary | ICD-10-CM | POA: Diagnosis not present

## 2014-08-07 DIAGNOSIS — J069 Acute upper respiratory infection, unspecified: Secondary | ICD-10-CM | POA: Diagnosis not present

## 2014-08-07 DIAGNOSIS — I10 Essential (primary) hypertension: Secondary | ICD-10-CM | POA: Diagnosis not present

## 2014-08-07 DIAGNOSIS — N179 Acute kidney failure, unspecified: Secondary | ICD-10-CM | POA: Diagnosis not present

## 2014-08-07 DIAGNOSIS — D649 Anemia, unspecified: Secondary | ICD-10-CM | POA: Diagnosis not present

## 2014-08-08 ENCOUNTER — Ambulatory Visit: Payer: BC Managed Care – PPO | Admitting: Nurse Practitioner

## 2014-08-08 ENCOUNTER — Encounter: Payer: Self-pay | Admitting: Internal Medicine

## 2014-08-09 LAB — URINALYSIS, COMPLETE
BACTERIA: NONE SEEN
Bilirubin,UR: NEGATIVE
Blood: NEGATIVE
Glucose,UR: NEGATIVE mg/dL (ref 0–75)
KETONE: NEGATIVE
LEUKOCYTE ESTERASE: NEGATIVE
Nitrite: NEGATIVE
PH: 5 (ref 4.5–8.0)
PROTEIN: NEGATIVE
RBC,UR: NONE SEEN /HPF (ref 0–5)
SPECIFIC GRAVITY: 1.013 (ref 1.003–1.030)
SQUAMOUS EPITHELIAL: NONE SEEN

## 2014-08-10 DIAGNOSIS — M779 Enthesopathy, unspecified: Secondary | ICD-10-CM | POA: Diagnosis not present

## 2014-08-11 ENCOUNTER — Ambulatory Visit: Payer: BC Managed Care – PPO | Admitting: Internal Medicine

## 2014-08-11 LAB — RENAL FUNCTION PANEL
ANION GAP: 8 (ref 7–16)
Albumin: 3 g/dL — ABNORMAL LOW (ref 3.4–5.0)
BUN: 25 mg/dL — ABNORMAL HIGH (ref 7–18)
Calcium, Total: 7.4 mg/dL — ABNORMAL LOW (ref 8.5–10.1)
Chloride: 103 mmol/L (ref 98–107)
Co2: 33 mmol/L — ABNORMAL HIGH (ref 21–32)
Creatinine: 2.44 mg/dL — ABNORMAL HIGH (ref 0.60–1.30)
EGFR (African American): 35 — ABNORMAL LOW
EGFR (Non-African Amer.): 29 — ABNORMAL LOW
GLUCOSE: 112 mg/dL — AB (ref 65–99)
Osmolality: 292 (ref 275–301)
Phosphorus: 2.8 mg/dL (ref 2.5–4.9)
Potassium: 3.4 mmol/L — ABNORMAL LOW (ref 3.5–5.1)
Sodium: 144 mmol/L (ref 136–145)

## 2014-08-11 LAB — URINE CULTURE

## 2014-08-17 DIAGNOSIS — M1A00X0 Idiopathic chronic gout, unspecified site, without tophus (tophi): Secondary | ICD-10-CM | POA: Diagnosis not present

## 2014-08-17 DIAGNOSIS — G20 Parkinson's disease: Secondary | ICD-10-CM | POA: Diagnosis not present

## 2014-08-17 DIAGNOSIS — R251 Tremor, unspecified: Secondary | ICD-10-CM | POA: Diagnosis not present

## 2014-08-17 DIAGNOSIS — M150 Primary generalized (osteo)arthritis: Secondary | ICD-10-CM | POA: Diagnosis not present

## 2014-08-22 DIAGNOSIS — R251 Tremor, unspecified: Secondary | ICD-10-CM | POA: Diagnosis not present

## 2014-08-22 DIAGNOSIS — R2681 Unsteadiness on feet: Secondary | ICD-10-CM | POA: Diagnosis not present

## 2014-08-22 DIAGNOSIS — R262 Difficulty in walking, not elsewhere classified: Secondary | ICD-10-CM | POA: Diagnosis not present

## 2014-08-22 DIAGNOSIS — R410 Disorientation, unspecified: Secondary | ICD-10-CM | POA: Diagnosis not present

## 2014-08-23 ENCOUNTER — Telehealth: Payer: Self-pay

## 2014-08-23 NOTE — Telephone Encounter (Signed)
The patient's wife called and is hoping to get the patient in for a hospital follow up. She states he was discharged from [facility], then went to a SNF for two weeks, but is now hoping to see Dr.Tullo to discuss the new medications he is on.  Do you want him worked in? If so, where?   [phone] (wife's name - Harmon Pier)

## 2014-08-23 NOTE — Telephone Encounter (Signed)
11:30 on Thursday , unless that spot has already been taken

## 2014-08-23 NOTE — Telephone Encounter (Signed)
Pt scheduled.  Called wife and lvmom.

## 2014-08-24 ENCOUNTER — Ambulatory Visit (INDEPENDENT_AMBULATORY_CARE_PROVIDER_SITE_OTHER): Payer: Medicare Other | Admitting: Internal Medicine

## 2014-08-24 ENCOUNTER — Inpatient Hospital Stay: Payer: Self-pay | Admitting: Internal Medicine

## 2014-08-24 ENCOUNTER — Encounter: Payer: Self-pay | Admitting: Internal Medicine

## 2014-08-24 VITALS — BP 118/70 | HR 79 | Temp 98.8°F

## 2014-08-24 DIAGNOSIS — N179 Acute kidney failure, unspecified: Secondary | ICD-10-CM | POA: Diagnosis not present

## 2014-08-24 DIAGNOSIS — J441 Chronic obstructive pulmonary disease with (acute) exacerbation: Secondary | ICD-10-CM | POA: Diagnosis not present

## 2014-08-24 DIAGNOSIS — D509 Iron deficiency anemia, unspecified: Secondary | ICD-10-CM | POA: Diagnosis not present

## 2014-08-24 DIAGNOSIS — Z880 Allergy status to penicillin: Secondary | ICD-10-CM | POA: Diagnosis not present

## 2014-08-24 DIAGNOSIS — R0902 Hypoxemia: Secondary | ICD-10-CM | POA: Diagnosis not present

## 2014-08-24 DIAGNOSIS — Z9181 History of falling: Secondary | ICD-10-CM | POA: Diagnosis not present

## 2014-08-24 DIAGNOSIS — M479 Spondylosis, unspecified: Secondary | ICD-10-CM | POA: Diagnosis present

## 2014-08-24 DIAGNOSIS — F329 Major depressive disorder, single episode, unspecified: Secondary | ICD-10-CM | POA: Diagnosis not present

## 2014-08-24 DIAGNOSIS — M10072 Idiopathic gout, left ankle and foot: Secondary | ICD-10-CM | POA: Diagnosis not present

## 2014-08-24 DIAGNOSIS — R531 Weakness: Secondary | ICD-10-CM | POA: Diagnosis not present

## 2014-08-24 DIAGNOSIS — I2510 Atherosclerotic heart disease of native coronary artery without angina pectoris: Secondary | ICD-10-CM

## 2014-08-24 DIAGNOSIS — N189 Chronic kidney disease, unspecified: Secondary | ICD-10-CM | POA: Diagnosis not present

## 2014-08-24 DIAGNOSIS — J189 Pneumonia, unspecified organism: Secondary | ICD-10-CM | POA: Diagnosis not present

## 2014-08-24 DIAGNOSIS — N400 Enlarged prostate without lower urinary tract symptoms: Secondary | ICD-10-CM | POA: Diagnosis present

## 2014-08-24 DIAGNOSIS — M6281 Muscle weakness (generalized): Secondary | ICD-10-CM | POA: Diagnosis not present

## 2014-08-24 DIAGNOSIS — F0390 Unspecified dementia without behavioral disturbance: Secondary | ICD-10-CM | POA: Diagnosis present

## 2014-08-24 DIAGNOSIS — G250 Essential tremor: Secondary | ICD-10-CM | POA: Diagnosis present

## 2014-08-24 DIAGNOSIS — Z9981 Dependence on supplemental oxygen: Secondary | ICD-10-CM | POA: Diagnosis not present

## 2014-08-24 DIAGNOSIS — M659 Synovitis and tenosynovitis, unspecified: Secondary | ICD-10-CM | POA: Diagnosis present

## 2014-08-24 DIAGNOSIS — R918 Other nonspecific abnormal finding of lung field: Secondary | ICD-10-CM | POA: Diagnosis not present

## 2014-08-24 DIAGNOSIS — F418 Other specified anxiety disorders: Secondary | ICD-10-CM | POA: Diagnosis present

## 2014-08-24 DIAGNOSIS — Z8249 Family history of ischemic heart disease and other diseases of the circulatory system: Secondary | ICD-10-CM | POA: Diagnosis not present

## 2014-08-24 DIAGNOSIS — K219 Gastro-esophageal reflux disease without esophagitis: Secondary | ICD-10-CM | POA: Diagnosis not present

## 2014-08-24 DIAGNOSIS — Z888 Allergy status to other drugs, medicaments and biological substances status: Secondary | ICD-10-CM | POA: Diagnosis not present

## 2014-08-24 DIAGNOSIS — F09 Unspecified mental disorder due to known physiological condition: Secondary | ICD-10-CM | POA: Diagnosis not present

## 2014-08-24 DIAGNOSIS — S0990XA Unspecified injury of head, initial encounter: Secondary | ICD-10-CM | POA: Diagnosis not present

## 2014-08-24 DIAGNOSIS — G252 Other specified forms of tremor: Secondary | ICD-10-CM

## 2014-08-24 DIAGNOSIS — D638 Anemia in other chronic diseases classified elsewhere: Secondary | ICD-10-CM | POA: Diagnosis present

## 2014-08-24 DIAGNOSIS — F419 Anxiety disorder, unspecified: Secondary | ICD-10-CM | POA: Diagnosis not present

## 2014-08-24 DIAGNOSIS — G3289 Other specified degenerative disorders of nervous system in diseases classified elsewhere: Secondary | ICD-10-CM | POA: Diagnosis not present

## 2014-08-24 DIAGNOSIS — G919 Hydrocephalus, unspecified: Secondary | ICD-10-CM | POA: Diagnosis not present

## 2014-08-24 DIAGNOSIS — I129 Hypertensive chronic kidney disease with stage 1 through stage 4 chronic kidney disease, or unspecified chronic kidney disease: Secondary | ICD-10-CM | POA: Diagnosis present

## 2014-08-24 DIAGNOSIS — Z79899 Other long term (current) drug therapy: Secondary | ICD-10-CM | POA: Diagnosis not present

## 2014-08-24 DIAGNOSIS — Z91041 Radiographic dye allergy status: Secondary | ICD-10-CM | POA: Diagnosis not present

## 2014-08-24 DIAGNOSIS — N183 Chronic kidney disease, stage 3 (moderate): Secondary | ICD-10-CM | POA: Diagnosis not present

## 2014-08-24 DIAGNOSIS — J449 Chronic obstructive pulmonary disease, unspecified: Secondary | ICD-10-CM | POA: Diagnosis not present

## 2014-08-24 DIAGNOSIS — E785 Hyperlipidemia, unspecified: Secondary | ICD-10-CM | POA: Diagnosis present

## 2014-08-24 DIAGNOSIS — R062 Wheezing: Secondary | ICD-10-CM | POA: Diagnosis not present

## 2014-08-24 DIAGNOSIS — Z7902 Long term (current) use of antithrombotics/antiplatelets: Secondary | ICD-10-CM | POA: Diagnosis not present

## 2014-08-24 DIAGNOSIS — R296 Repeated falls: Secondary | ICD-10-CM | POA: Diagnosis not present

## 2014-08-24 DIAGNOSIS — M65172 Other infective (teno)synovitis, left ankle and foot: Secondary | ICD-10-CM | POA: Diagnosis not present

## 2014-08-24 DIAGNOSIS — E876 Hypokalemia: Secondary | ICD-10-CM | POA: Diagnosis not present

## 2014-08-24 DIAGNOSIS — Z048 Encounter for examination and observation for other specified reasons: Secondary | ICD-10-CM | POA: Diagnosis not present

## 2014-08-24 LAB — COMPREHENSIVE METABOLIC PANEL
ALT: 19 U/L
ANION GAP: 9 (ref 7–16)
Albumin: 3.1 g/dL — ABNORMAL LOW (ref 3.4–5.0)
Alkaline Phosphatase: 85 U/L
BUN: 20 mg/dL — ABNORMAL HIGH (ref 7–18)
Bilirubin,Total: 0.6 mg/dL (ref 0.2–1.0)
CO2: 28 mmol/L (ref 21–32)
CREATININE: 1.81 mg/dL — AB (ref 0.60–1.30)
Calcium, Total: 6.4 mg/dL — CL (ref 8.5–10.1)
Chloride: 103 mmol/L (ref 98–107)
EGFR (African American): 49 — ABNORMAL LOW
EGFR (Non-African Amer.): 40 — ABNORMAL LOW
GLUCOSE: 107 mg/dL — AB (ref 65–99)
OSMOLALITY: 282 (ref 275–301)
Potassium: 3.3 mmol/L — ABNORMAL LOW (ref 3.5–5.1)
SGOT(AST): 22 U/L (ref 15–37)
Sodium: 140 mmol/L (ref 136–145)
Total Protein: 7.4 g/dL (ref 6.4–8.2)

## 2014-08-24 LAB — CBC WITH DIFFERENTIAL/PLATELET
BASOS ABS: 0 10*3/uL (ref 0.0–0.1)
BASOS ABS: 0 10*3/uL (ref 0.0–0.1)
BASOS PCT: 0.3 %
BASOS PCT: 0.2 %
EOS ABS: 0.1 10*3/uL (ref 0.0–0.7)
EOS PCT: 0.9 %
EOS PCT: 1.1 %
Eosinophil #: 0.1 10*3/uL (ref 0.0–0.7)
HCT: 30.2 % — ABNORMAL LOW (ref 40.0–52.0)
HCT: 27.9 % — ABNORMAL LOW (ref 40.0–52.0)
HGB: 9.6 g/dL — AB (ref 13.0–18.0)
HGB: 9 g/dL — ABNORMAL LOW (ref 13.0–18.0)
LYMPHS ABS: 1.2 10*3/uL (ref 1.0–3.6)
LYMPHS ABS: 1.7 10*3/uL (ref 1.0–3.6)
LYMPHS PCT: 13.2 %
Lymphocyte %: 22.6 %
MCH: 20 pg — ABNORMAL LOW (ref 26.0–34.0)
MCH: 20.3 pg — ABNORMAL LOW (ref 26.0–34.0)
MCHC: 31.6 g/dL — ABNORMAL LOW (ref 32.0–36.0)
MCHC: 32.3 g/dL (ref 32.0–36.0)
MCV: 63 fL — ABNORMAL LOW (ref 80–100)
MCV: 63 fL — AB (ref 80–100)
MONO ABS: 0.8 x10 3/mm (ref 0.2–1.0)
MONOS PCT: 10.5 %
Monocyte #: 0.7 x10 3/mm (ref 0.2–1.0)
Monocyte %: 7.8 %
NEUTROS ABS: 6.9 10*3/uL — AB (ref 1.4–6.5)
NEUTROS PCT: 77.8 %
Neutrophil #: 5 10*3/uL (ref 1.4–6.5)
Neutrophil %: 65.6 %
PLATELETS: 183 10*3/uL (ref 150–440)
Platelet: 201 10*3/uL (ref 150–440)
RBC: 4.78 10*6/uL (ref 4.40–5.90)
RBC: 4.42 10*6/uL (ref 4.40–5.90)
RDW: 15.1 % — ABNORMAL HIGH (ref 11.5–14.5)
RDW: 14.8 % — ABNORMAL HIGH (ref 11.5–14.5)
WBC: 8.9 10*3/uL (ref 3.8–10.6)
WBC: 7.7 10*3/uL (ref 3.8–10.6)

## 2014-08-24 LAB — BASIC METABOLIC PANEL
Anion Gap: 10 (ref 7–16)
BUN: 21 mg/dL — ABNORMAL HIGH (ref 7–18)
CALCIUM: 6.5 mg/dL — AB (ref 8.5–10.1)
CHLORIDE: 104 mmol/L (ref 98–107)
Co2: 27 mmol/L (ref 21–32)
Creatinine: 1.76 mg/dL — ABNORMAL HIGH (ref 0.60–1.30)
EGFR (Non-African Amer.): 42 — ABNORMAL LOW
GFR CALC AF AMER: 50 — AB
Glucose: 110 mg/dL — ABNORMAL HIGH (ref 65–99)
Osmolality: 285 (ref 275–301)
Potassium: 3.4 mmol/L — ABNORMAL LOW (ref 3.5–5.1)
SODIUM: 141 mmol/L (ref 136–145)

## 2014-08-24 LAB — URINALYSIS, COMPLETE
BLOOD: NEGATIVE
Bacteria: NONE SEEN
Bilirubin,UR: NEGATIVE
Glucose,UR: NEGATIVE mg/dL (ref 0–75)
Ketone: NEGATIVE
Leukocyte Esterase: NEGATIVE
Nitrite: NEGATIVE
PROTEIN: NEGATIVE
Ph: 5 (ref 4.5–8.0)
RBC,UR: <1 /HPF (ref 0–5)
SQUAMOUS EPITHELIAL: NONE SEEN
Specific Gravity: 1.015 (ref 1.003–1.030)
WBC UR: 2 /HPF (ref 0–5)

## 2014-08-24 LAB — MAGNESIUM
Magnesium: <0.3 mg/dL
Magnesium: <0.2 mg/dL — ABNORMAL LOW

## 2014-08-24 LAB — PHOSPHORUS: Phosphorus: 2.8 mg/dL (ref 2.5–4.9)

## 2014-08-24 LAB — TROPONIN I: Troponin-I: <0.02 ng/mL

## 2014-08-24 MED ORDER — LEVOFLOXACIN 500 MG PO TABS: 500.0000 mg | ORAL_TABLET | Freq: Every day | ORAL | Status: DC

## 2014-08-24 NOTE — Progress Notes (Signed)
Pre visit review using our clinic review tool, if applicable. No additional management support is needed unless otherwise documented below in the visit note. 
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
## 2014-08-24 NOTE — Telephone Encounter (Signed)
Pt was seen today.

## 2014-08-24 NOTE — Patient Instructions (Addendum)
I am treating you for  Bronchitis/otitis with levaquin for 7 days.    I recommend that you return to the ER for disposition back to a rehabilitation facility because you are too weak to return home safely

## 2014-08-25 LAB — RENAL FUNCTION PANEL
Albumin: 3 g/dL — ABNORMAL LOW (ref 3.4–5.0)
Anion Gap: 10 (ref 7–16)
BUN: 14 mg/dL (ref 7–18)
CHLORIDE: 107 mmol/L (ref 98–107)
CO2: 25 mmol/L (ref 21–32)
Calcium, Total: 7.1 mg/dL — ABNORMAL LOW (ref 8.5–10.1)
Creatinine: 1.55 mg/dL — ABNORMAL HIGH (ref 0.60–1.30)
EGFR (Non-African Amer.): 48 — ABNORMAL LOW
GFR CALC AF AMER: 58 — AB
Glucose: 104 mg/dL — ABNORMAL HIGH (ref 65–99)
OSMOLALITY: 284 (ref 275–301)
PHOSPHORUS: 2.8 mg/dL (ref 2.5–4.9)
Potassium: 3.5 mmol/L (ref 3.5–5.1)
Sodium: 142 mmol/L (ref 136–145)

## 2014-08-25 LAB — MAGNESIUM
MAGNESIUM: 2.9 mg/dL — AB
Magnesium: 2.1 mg/dL

## 2014-08-26 DIAGNOSIS — R531 Weakness: Secondary | ICD-10-CM | POA: Insufficient documentation

## 2014-08-26 LAB — RENAL FUNCTION PANEL
Albumin: 3.1 g/dL — ABNORMAL LOW (ref 3.4–5.0)
Anion Gap: 8 (ref 7–16)
BUN: 9 mg/dL (ref 7–18)
Calcium, Total: 7.8 mg/dL — ABNORMAL LOW (ref 8.5–10.1)
Chloride: 104 mmol/L (ref 98–107)
Co2: 26 mmol/L (ref 21–32)
Creatinine: 1.51 mg/dL — ABNORMAL HIGH (ref 0.60–1.30)
EGFR (African American): >60
EGFR (Non-African Amer.): 50 — ABNORMAL LOW
Glucose: 138 mg/dL — ABNORMAL HIGH (ref 65–99)
OSMOLALITY: 277 (ref 275–301)
PHOSPHORUS: 2 mg/dL — AB (ref 2.5–4.9)
Potassium: 4.2 mmol/L (ref 3.5–5.1)
Sodium: 138 mmol/L (ref 136–145)

## 2014-08-26 LAB — MAGNESIUM
Magnesium: 2.5 mg/dL — ABNORMAL HIGH
Magnesium: 2.3 mg/dL

## 2014-08-26 LAB — IRON AND TIBC
Iron Bind.Cap.(Total): 177 ug/dL — ABNORMAL LOW (ref 250–450)
Iron Saturation: 11 %
Iron: 19 ug/dL — ABNORMAL LOW (ref 65–175)
Unbound Iron-Bind.Cap.: 158 ug/dL

## 2014-08-26 LAB — FERRITIN: Ferritin (ARMC): 582 ng/mL — ABNORMAL HIGH (ref 8–388)

## 2014-08-26 NOTE — Assessment & Plan Note (Signed)
His weakness is too profound to return home safely, even with a wheelchair due to transfer issues.  He was advised to return to ER.

## 2014-08-26 NOTE — Assessment & Plan Note (Signed)
Neurology evaluation by [facility] was reviewed through the EPIC portal.  Per patient's wife, the evaluation was cursory and did not adequately assess for Parkinsonism.  MRi and EEG were ordered.  A second opinion has been requested.

## 2014-08-26 NOTE — Assessment & Plan Note (Signed)
Severe and critical by testing in ER ,  Mg was 0.2,  Etiology unclear , but likely the cause of his weakness .  He was admitted for supplementation

## 2014-08-27 LAB — BASIC METABOLIC PANEL
Anion Gap: 8 (ref 7–16)
BUN: 18 mg/dL (ref 7–18)
CHLORIDE: 105 mmol/L (ref 98–107)
Calcium, Total: 8.7 mg/dL (ref 8.5–10.1)
Co2: 25 mmol/L (ref 21–32)
Creatinine: 1.44 mg/dL — ABNORMAL HIGH (ref 0.60–1.30)
EGFR (Non-African Amer.): 52 — ABNORMAL LOW
Glucose: 98 mg/dL (ref 65–99)
OSMOLALITY: 278 (ref 275–301)
Potassium: 4.8 mmol/L (ref 3.5–5.1)
Sodium: 138 mmol/L (ref 136–145)

## 2014-08-27 LAB — MAGNESIUM: Magnesium: 2.2 mg/dL

## 2014-08-28 LAB — PLATELET COUNT: PLATELETS: 380 10*3/uL (ref 150–440)

## 2014-08-30 LAB — BASIC METABOLIC PANEL
ANION GAP: 7 (ref 7–16)
BUN: 27 mg/dL — ABNORMAL HIGH (ref 7–18)
CHLORIDE: 100 mmol/L (ref 98–107)
Calcium, Total: 10.5 mg/dL — ABNORMAL HIGH (ref 8.5–10.1)
Co2: 25 mmol/L (ref 21–32)
Creatinine: 1.68 mg/dL — ABNORMAL HIGH (ref 0.60–1.30)
EGFR (African American): 53 — ABNORMAL LOW
EGFR (Non-African Amer.): 44 — ABNORMAL LOW
Glucose: 98 mg/dL (ref 65–99)
OSMOLALITY: 270 (ref 275–301)
POTASSIUM: 5.4 mmol/L — AB (ref 3.5–5.1)
SODIUM: 132 mmol/L — AB (ref 136–145)

## 2014-08-31 LAB — PHOSPHORUS: Phosphorus: 4.7 mg/dL (ref 2.5–4.9)

## 2014-08-31 LAB — BASIC METABOLIC PANEL
Anion Gap: 11 (ref 7–16)
BUN: 32 mg/dL — ABNORMAL HIGH (ref 7–18)
CHLORIDE: 96 mmol/L — AB (ref 98–107)
CO2: 25 mmol/L (ref 21–32)
CREATININE: 1.64 mg/dL — AB (ref 0.60–1.30)
Calcium, Total: 10.2 mg/dL — ABNORMAL HIGH (ref 8.5–10.1)
EGFR (African American): 55 — ABNORMAL LOW
EGFR (Non-African Amer.): 45 — ABNORMAL LOW
GLUCOSE: 101 mg/dL — AB (ref 65–99)
Osmolality: 272 (ref 275–301)
Potassium: 4.9 mmol/L (ref 3.5–5.1)
Sodium: 132 mmol/L — ABNORMAL LOW (ref 136–145)

## 2014-08-31 LAB — MAGNESIUM: Magnesium: 1.9 mg/dL

## 2014-08-31 LAB — TSH: Thyroid Stimulating Horm: 2.34 u[IU]/mL

## 2014-09-01 LAB — CREATININE, SERUM
Creatinine: 1.51 mg/dL — ABNORMAL HIGH (ref 0.60–1.30)
EGFR (Non-African Amer.): 50 — ABNORMAL LOW

## 2014-09-02 LAB — WBC: WBC: 36.2 10*3/uL — ABNORMAL HIGH (ref 3.8–10.6)

## 2014-09-03 LAB — WBC: WBC: 34.8 10*3/uL — AB (ref 3.8–10.6)

## 2014-09-04 ENCOUNTER — Telehealth: Payer: Self-pay | Admitting: Internal Medicine

## 2014-09-04 LAB — WBC: WBC: 38.1 10*3/uL — AB (ref 3.8–10.6)

## 2014-09-04 NOTE — Telephone Encounter (Signed)
Patient's wife wanted to report that her husband has been at [facility] since the 17th December and he is being discharged tomorrow 09/05/14 and he is going to [facility] 09/05/14 for rehab.

## 2014-09-05 DIAGNOSIS — F419 Anxiety disorder, unspecified: Secondary | ICD-10-CM | POA: Diagnosis not present

## 2014-09-05 DIAGNOSIS — Z9181 History of falling: Secondary | ICD-10-CM | POA: Diagnosis not present

## 2014-09-05 DIAGNOSIS — J189 Pneumonia, unspecified organism: Secondary | ICD-10-CM | POA: Diagnosis not present

## 2014-09-05 DIAGNOSIS — D649 Anemia, unspecified: Secondary | ICD-10-CM | POA: Diagnosis not present

## 2014-09-05 DIAGNOSIS — R251 Tremor, unspecified: Secondary | ICD-10-CM | POA: Diagnosis not present

## 2014-09-05 DIAGNOSIS — I10 Essential (primary) hypertension: Secondary | ICD-10-CM | POA: Diagnosis not present

## 2014-09-05 DIAGNOSIS — K219 Gastro-esophageal reflux disease without esophagitis: Secondary | ICD-10-CM | POA: Diagnosis not present

## 2014-09-05 DIAGNOSIS — R531 Weakness: Secondary | ICD-10-CM | POA: Diagnosis not present

## 2014-09-05 DIAGNOSIS — F0390 Unspecified dementia without behavioral disturbance: Secondary | ICD-10-CM | POA: Diagnosis not present

## 2014-09-05 DIAGNOSIS — M65172 Other infective (teno)synovitis, left ankle and foot: Secondary | ICD-10-CM | POA: Diagnosis not present

## 2014-09-05 DIAGNOSIS — J449 Chronic obstructive pulmonary disease, unspecified: Secondary | ICD-10-CM | POA: Diagnosis not present

## 2014-09-05 DIAGNOSIS — E785 Hyperlipidemia, unspecified: Secondary | ICD-10-CM | POA: Diagnosis not present

## 2014-09-05 DIAGNOSIS — D638 Anemia in other chronic diseases classified elsewhere: Secondary | ICD-10-CM | POA: Diagnosis not present

## 2014-09-05 DIAGNOSIS — N400 Enlarged prostate without lower urinary tract symptoms: Secondary | ICD-10-CM | POA: Diagnosis not present

## 2014-09-05 DIAGNOSIS — I129 Hypertensive chronic kidney disease with stage 1 through stage 4 chronic kidney disease, or unspecified chronic kidney disease: Secondary | ICD-10-CM | POA: Diagnosis not present

## 2014-09-05 DIAGNOSIS — J159 Unspecified bacterial pneumonia: Secondary | ICD-10-CM | POA: Diagnosis not present

## 2014-09-05 DIAGNOSIS — F329 Major depressive disorder, single episode, unspecified: Secondary | ICD-10-CM | POA: Diagnosis not present

## 2014-09-05 DIAGNOSIS — F09 Unspecified mental disorder due to known physiological condition: Secondary | ICD-10-CM | POA: Diagnosis not present

## 2014-09-05 DIAGNOSIS — I2510 Atherosclerotic heart disease of native coronary artery without angina pectoris: Secondary | ICD-10-CM | POA: Diagnosis not present

## 2014-09-05 DIAGNOSIS — N183 Chronic kidney disease, stage 3 (moderate): Secondary | ICD-10-CM | POA: Diagnosis not present

## 2014-09-05 DIAGNOSIS — Z9981 Dependence on supplemental oxygen: Secondary | ICD-10-CM | POA: Diagnosis not present

## 2014-09-05 LAB — BASIC METABOLIC PANEL
Anion Gap: 9 (ref 7–16)
BUN: 49 mg/dL — AB (ref 7–18)
CALCIUM: 9.9 mg/dL (ref 8.5–10.1)
Chloride: 99 mmol/L (ref 98–107)
Co2: 27 mmol/L (ref 21–32)
Creatinine: 1.56 mg/dL — ABNORMAL HIGH (ref 0.60–1.30)
GFR CALC AF AMER: 58 — AB
GFR CALC NON AF AMER: 48 — AB
GLUCOSE: 99 mg/dL (ref 65–99)
OSMOLALITY: 283 (ref 275–301)
Potassium: 4.5 mmol/L (ref 3.5–5.1)
Sodium: 135 mmol/L — ABNORMAL LOW (ref 136–145)

## 2014-09-05 LAB — CBC WITH DIFFERENTIAL/PLATELET
BASOS ABS: 0.1 10*3/uL (ref 0.0–0.1)
Basophil %: 0.2 %
EOS ABS: 0 10*3/uL (ref 0.0–0.7)
Eosinophil %: 0.1 %
HCT: 32.7 % — ABNORMAL LOW (ref 40.0–52.0)
HGB: 10.2 g/dL — ABNORMAL LOW (ref 13.0–18.0)
Lymphocyte #: 4 10*3/uL — ABNORMAL HIGH (ref 1.0–3.6)
Lymphocyte %: 10.7 %
MCH: 19.8 pg — AB (ref 26.0–34.0)
MCHC: 31.2 g/dL — ABNORMAL LOW (ref 32.0–36.0)
MCV: 63 fL — ABNORMAL LOW (ref 80–100)
Monocyte #: 1.9 x10 3/mm — ABNORMAL HIGH (ref 0.2–1.0)
Monocyte %: 5.2 %
Neutrophil #: 31.5 10*3/uL — ABNORMAL HIGH (ref 1.4–6.5)
Neutrophil %: 83.8 %
PLATELETS: 489 10*3/uL — AB (ref 150–440)
RBC: 5.16 10*6/uL (ref 4.40–5.90)
RDW: 15.1 % — ABNORMAL HIGH (ref 11.5–14.5)
WBC: 37.6 10*3/uL — ABNORMAL HIGH (ref 3.8–10.6)

## 2014-09-08 ENCOUNTER — Encounter: Payer: Self-pay | Admitting: Internal Medicine

## 2014-09-21 ENCOUNTER — Ambulatory Visit: Payer: BC Managed Care – PPO | Admitting: Internal Medicine

## 2014-09-22 DIAGNOSIS — R251 Tremor, unspecified: Secondary | ICD-10-CM | POA: Diagnosis not present

## 2014-09-23 DIAGNOSIS — F419 Anxiety disorder, unspecified: Secondary | ICD-10-CM | POA: Diagnosis not present

## 2014-09-23 DIAGNOSIS — F0390 Unspecified dementia without behavioral disturbance: Secondary | ICD-10-CM | POA: Diagnosis not present

## 2014-09-23 DIAGNOSIS — I129 Hypertensive chronic kidney disease with stage 1 through stage 4 chronic kidney disease, or unspecified chronic kidney disease: Secondary | ICD-10-CM | POA: Diagnosis not present

## 2014-09-23 DIAGNOSIS — J441 Chronic obstructive pulmonary disease with (acute) exacerbation: Secondary | ICD-10-CM | POA: Diagnosis not present

## 2014-09-23 DIAGNOSIS — F329 Major depressive disorder, single episode, unspecified: Secondary | ICD-10-CM | POA: Diagnosis not present

## 2014-09-23 DIAGNOSIS — N183 Chronic kidney disease, stage 3 (moderate): Secondary | ICD-10-CM | POA: Diagnosis not present

## 2014-09-26 DIAGNOSIS — I129 Hypertensive chronic kidney disease with stage 1 through stage 4 chronic kidney disease, or unspecified chronic kidney disease: Secondary | ICD-10-CM | POA: Diagnosis not present

## 2014-09-26 DIAGNOSIS — F419 Anxiety disorder, unspecified: Secondary | ICD-10-CM | POA: Diagnosis not present

## 2014-09-26 DIAGNOSIS — F329 Major depressive disorder, single episode, unspecified: Secondary | ICD-10-CM | POA: Diagnosis not present

## 2014-09-26 DIAGNOSIS — N183 Chronic kidney disease, stage 3 (moderate): Secondary | ICD-10-CM | POA: Diagnosis not present

## 2014-09-26 DIAGNOSIS — J441 Chronic obstructive pulmonary disease with (acute) exacerbation: Secondary | ICD-10-CM | POA: Diagnosis not present

## 2014-09-26 DIAGNOSIS — F0390 Unspecified dementia without behavioral disturbance: Secondary | ICD-10-CM | POA: Diagnosis not present

## 2014-09-27 ENCOUNTER — Telehealth: Payer: Self-pay | Admitting: Internal Medicine

## 2014-09-27 MED ORDER — ALPRAZOLAM ER 0.5 MG PO TB24: 0.5000 mg | ORAL_TABLET | Freq: Every day | ORAL | Status: DC

## 2014-09-27 NOTE — Telephone Encounter (Signed)
Alprazolam ER 0.5 mg fillled per chart,  Not 1 mg bc it can't be cut in half.   Ok to fill metoprolol  For 90 days

## 2014-09-27 NOTE — Telephone Encounter (Signed)
The patient will run out of his medication on Friday he has an appointment with Dr. Derrel Nip is for Friday 1.22.16 and we are needing to reschedule is appointment . Can we refill these medication  ALPRAZolam (XANAX XR) 1 MG 24 hr tablet  metoprolol tartrate (LOPRESSOR) 25 MG tablet

## 2014-09-27 NOTE — Telephone Encounter (Signed)
Ok to refill Alprazolam? °

## 2014-09-28 MED ORDER — METOPROLOL TARTRATE 25 MG PO TABS: 25.0000 mg | ORAL_TABLET | Freq: Two times a day (BID) | ORAL | Status: DC

## 2014-09-28 NOTE — Telephone Encounter (Signed)
Patient medication was faxed to facility and One script sent electronically patient notified.

## 2014-09-29 ENCOUNTER — Ambulatory Visit: Payer: Medicare Other | Admitting: Internal Medicine

## 2014-10-03 DIAGNOSIS — F419 Anxiety disorder, unspecified: Secondary | ICD-10-CM | POA: Diagnosis not present

## 2014-10-03 DIAGNOSIS — F0390 Unspecified dementia without behavioral disturbance: Secondary | ICD-10-CM | POA: Diagnosis not present

## 2014-10-03 DIAGNOSIS — F329 Major depressive disorder, single episode, unspecified: Secondary | ICD-10-CM | POA: Diagnosis not present

## 2014-10-03 DIAGNOSIS — J441 Chronic obstructive pulmonary disease with (acute) exacerbation: Secondary | ICD-10-CM | POA: Diagnosis not present

## 2014-10-03 DIAGNOSIS — I129 Hypertensive chronic kidney disease with stage 1 through stage 4 chronic kidney disease, or unspecified chronic kidney disease: Secondary | ICD-10-CM | POA: Diagnosis not present

## 2014-10-03 DIAGNOSIS — N183 Chronic kidney disease, stage 3 (moderate): Secondary | ICD-10-CM | POA: Diagnosis not present

## 2014-10-06 DIAGNOSIS — F0390 Unspecified dementia without behavioral disturbance: Secondary | ICD-10-CM | POA: Diagnosis not present

## 2014-10-06 DIAGNOSIS — F329 Major depressive disorder, single episode, unspecified: Secondary | ICD-10-CM | POA: Diagnosis not present

## 2014-10-06 DIAGNOSIS — N183 Chronic kidney disease, stage 3 (moderate): Secondary | ICD-10-CM | POA: Diagnosis not present

## 2014-10-06 DIAGNOSIS — I129 Hypertensive chronic kidney disease with stage 1 through stage 4 chronic kidney disease, or unspecified chronic kidney disease: Secondary | ICD-10-CM | POA: Diagnosis not present

## 2014-10-06 DIAGNOSIS — J441 Chronic obstructive pulmonary disease with (acute) exacerbation: Secondary | ICD-10-CM | POA: Diagnosis not present

## 2014-10-06 DIAGNOSIS — F419 Anxiety disorder, unspecified: Secondary | ICD-10-CM | POA: Diagnosis not present

## 2014-10-10 ENCOUNTER — Ambulatory Visit (INDEPENDENT_AMBULATORY_CARE_PROVIDER_SITE_OTHER): Payer: Medicare Other | Admitting: Cardiovascular Disease

## 2014-10-10 ENCOUNTER — Encounter: Payer: Self-pay | Admitting: Cardiovascular Disease

## 2014-10-10 VITALS — BP 120/80 | HR 65 | Ht 63.5 in | Wt 173.5 lb

## 2014-10-10 DIAGNOSIS — R531 Weakness: Secondary | ICD-10-CM | POA: Diagnosis not present

## 2014-10-10 DIAGNOSIS — I10 Essential (primary) hypertension: Secondary | ICD-10-CM

## 2014-10-10 DIAGNOSIS — G4733 Obstructive sleep apnea (adult) (pediatric): Secondary | ICD-10-CM

## 2014-10-10 DIAGNOSIS — E785 Hyperlipidemia, unspecified: Secondary | ICD-10-CM | POA: Diagnosis not present

## 2014-10-10 DIAGNOSIS — R002 Palpitations: Secondary | ICD-10-CM

## 2014-10-10 DIAGNOSIS — Z9989 Dependence on other enabling machines and devices: Secondary | ICD-10-CM

## 2014-10-10 DIAGNOSIS — I2510 Atherosclerotic heart disease of native coronary artery without angina pectoris: Secondary | ICD-10-CM | POA: Diagnosis not present

## 2014-10-10 NOTE — Patient Instructions (Addendum)
You are doing well. No medication changes were made.  We will check fasting labs this week or next  Drop the lipitor down to 20 mg daily after labs are drawn  Please call us if you have new issues that need to be addressed before your next appt.  Your physician wants you to follow-up in: 6 months.  You will receive a reminder letter in the mail two months in advance. If you don't receive a letter, please call our office to schedule the follow-up appointment.

## 2014-10-10 NOTE — Assessment & Plan Note (Signed)
Repeat lipid panel ordered. Suspect he would do just fine on Lipitor 20 mg daily given his recent weight loss

## 2014-10-10 NOTE — Assessment & Plan Note (Signed)
Tremendous weakness following 2 months in bed. Encouraged him to continue his physical therapy at home, will likely take an additional 3 months of PT to get back to his baseline

## 2014-10-10 NOTE — Assessment & Plan Note (Signed)
Currently with no symptoms of angina. No further workup at this time. Continue current medication regimen. 

## 2014-10-10 NOTE — Assessment & Plan Note (Signed)
[place]. He reports recent sleep studies in [place] showed no sleeps apnea. He does not wear his CPAP. Uncertain if he is having any periods of hypercapnia contributing to confusion. If symptoms of confusion persist, could repeat sleep study

## 2014-10-10 NOTE — Progress Notes (Signed)
Patient ID: Edwin Woodward, male    DOB: 23-Feb-1951, 64 y.o.   MRN: [id number]  HPI Comments: Mr Kopka is a very pleasant 64 year-old gentleman who has a history of coronary artery disease, bare-metal stents placed to his proximal RCA and LAD in April 2009, history of total knee replacements bilaterally, hypertension, hyperlipidemia, anxiety and ADHD who presents for routine followup after recent hospitalizations.  His ex-wife presents with him today. By her account, November 11 he developed foot pain, 07/21/2014 was told that he had plantar fasciitis by urgent care and told to stay in bed for 1 week. He became dehydrated, developed acute renal failure, change of mental status. EMTs were called November 20, diagnosed with gout, transferred to [facility] for rehabilitation. He had poor care at [facility], readmitted to the hospital with low magnesium, calcium. Had urinary tract infection, severe hypertension. Seen by Dr. Derrel Nip, in follow-up, diagnosed with upper respiratory infection/bronchitis, recommendation to start Levaquin. This was not started for 1 week while he was in the hospital. Symptoms got worse and he developed pneumonia. After discharge with sent to [facility].  In follow-up today, he is walking with a cane, several falls, very weak in the legs. Eating better, weight down at least 30 pounds from his prior clinic visit. Blood pressure is well controlled on metoprolol. No longer on ACE inhibitor or atenolol. He is periodically confused and has seen neurology. Possibly getting better slowly No significant chest pain or shortness of breath. Still on Lipitor 40 mg alternating with 20 mg  EKG on today's visit shows normal sinus rhythm with rate 65 bpm, right bundle branch block   Allergies  Allergen Reactions  . Biaxin [Clarithromycin]   . Clarithromycin   . Codeine     Sever vomiting   . Penicillins   . Promethazine     Outpatient Encounter Prescriptions as of 10/10/2014   Medication Sig  . alfuzosin (UROXATRAL) 10 MG 24 hr tablet Take 10 mg by mouth daily.    Marland Kitchen ALPRAZolam (XANAX XR) 0.5 MG 24 hr tablet Take 1 tablet (0.5 mg total) by mouth daily.  Marland Kitchen atorvastatin (LIPITOR) 40 MG tablet Take 40 mg alt with 20 mg daily.  Marland Kitchen buPROPion (WELLBUTRIN SR) 150 MG 12 hr tablet Take one tablet in the am & one tablet @@ 2 pm daily.  . calcium carbonate 1250 MG capsule Take 1,250 mg by mouth 2 (two) times daily with a meal.  . clindamycin (CLEOCIN) 300 MG capsule 2 CAPSULES ONE HOUR BEFORE DENTAL PROCEDURE  . diclofenac sodium (VOLTAREN) 1 % GEL Apply 1 application topically as needed.  . magnesium gluconate (MAGONATE) 500 MG tablet Take 500 mg by mouth 2 (two) times daily.  . metoprolol tartrate (LOPRESSOR) 25 MG tablet Take 1 tablet (25 mg total) by mouth 2 (two) times daily.  . nitroGLYCERIN (NITROSTAT) 0.4 MG SL tablet Place 1 tablet (0.4 mg total) under the tongue every 5 (five) minutes as needed.  . pantoprazole (PROTONIX) 40 MG tablet TAKE 1 TABLET (40 MG TOTAL) BY MOUTH DAILY.  Marland Kitchen PARoxetine (PAXIL-CR) 25 MG 24 hr tablet Take 1 tablet (25 mg total) by mouth every morning.  Marland Kitchen PLAVIX 75 MG tablet TAKE 1 TABLET (75 MG TOTAL) BY MOUTH DAILY.  Marland Kitchen QUEtiapine (SEROQUEL) 25 MG tablet Take 50 mg by mouth at bedtime.  . [DISCONTINUED] atenolol (TENORMIN) 25 MG tablet TAKE 1/2 TABLET BY MOUTH DAILY. (Patient not taking: Reported on 10/10/2014)  . [DISCONTINUED] levofloxacin (LEVAQUIN) 500 MG tablet  Take 1 tablet (500 mg total) by mouth daily. (Patient not taking: Reported on 10/10/2014)    Past Medical History  Diagnosis Date  . CAD (coronary artery disease)   . Presence of bare metal stent in right coronary artery     proximal RCA adn LAD in April 2009  . Hyperlipidemia   . Hypertension   . Cardiovascular stress test abnormal     Jan 2010 no significant ischemia  . Trigger finger     Past Surgical History  Procedure Laterality Date  . Replacement total knee    . Coronary  artery bypass graft      2 vessel  . Joint replacement      total knee  . Appendectomy    . Carpal tunnel release    . Vasectomy    . Coronary angioplasty  12/2007  . Appendectomy    . Tonsillectomy    . Cataract extraction      right    Social History  reports that he has quit smoking. He has never used smokeless tobacco. He reports that he uses illicit drugs (Marijuana). He reports that he does not drink alcohol.  Family History family history includes Diabetes in his mother; Heart disease in his father; Hypertension in his mother; Thalassemia in his mother.      Review of Systems  Constitutional: Positive for unexpected weight change.  Respiratory: Negative.   Cardiovascular: Negative.   Gastrointestinal: Negative.   Musculoskeletal: Positive for gait problem.       Trigger finger  Skin: Negative.   Neurological: Negative.   Hematological: Negative.   Psychiatric/Behavioral: Positive for confusion.  All other systems reviewed and are negative.   BP 120/80 mmHg  Pulse 65  Ht 5' 3.5" (1.613 m)  Wt 173 lb 8 oz (78.699 kg)  BMI 30.25 kg/m2  Physical Exam  Constitutional: He is oriented to person, place, and time. He appears well-developed and well-nourished.  Appears pale, weak, walks with a cane  HENT:  Head: Normocephalic.  Nose: Nose normal.  Mouth/Throat: Oropharynx is clear and moist.  Eyes: Conjunctivae are normal. Pupils are equal, round, and reactive to light.  Neck: Normal range of motion. Neck supple. No JVD present.  Cardiovascular: Normal rate, regular rhythm, S1 normal, S2 normal, normal heart sounds and intact distal pulses.  Exam reveals no gallop and no friction rub.   No murmur heard. Pulmonary/Chest: Effort normal and breath sounds normal. No respiratory distress. He has no wheezes. He has no rales. He exhibits no tenderness.  Abdominal: Soft. Bowel sounds are normal. He exhibits no distension. There is no tenderness.  Musculoskeletal: Normal  range of motion. He exhibits no edema or tenderness.  Lymphadenopathy:    He has no cervical adenopathy.  Neurological: He is alert and oriented to person, place, and time. Coordination normal.  Skin: Skin is warm and dry. No rash noted. No erythema.  Psychiatric: He has a normal mood and affect. His behavior is normal. Judgment and thought content normal.      Assessment and Plan   Nursing note and vitals reviewed.

## 2014-10-10 NOTE — Assessment & Plan Note (Signed)
Blood pressure is well controlled on today's visit. No changes made to the medications. Blood pressure likely improve given his recent weight loss

## 2014-10-12 ENCOUNTER — Telehealth: Payer: Self-pay | Admitting: *Deleted

## 2014-10-12 DIAGNOSIS — I129 Hypertensive chronic kidney disease with stage 1 through stage 4 chronic kidney disease, or unspecified chronic kidney disease: Secondary | ICD-10-CM | POA: Diagnosis not present

## 2014-10-12 DIAGNOSIS — F0390 Unspecified dementia without behavioral disturbance: Secondary | ICD-10-CM | POA: Diagnosis not present

## 2014-10-12 DIAGNOSIS — J441 Chronic obstructive pulmonary disease with (acute) exacerbation: Secondary | ICD-10-CM | POA: Diagnosis not present

## 2014-10-12 DIAGNOSIS — F329 Major depressive disorder, single episode, unspecified: Secondary | ICD-10-CM | POA: Diagnosis not present

## 2014-10-12 DIAGNOSIS — F419 Anxiety disorder, unspecified: Secondary | ICD-10-CM | POA: Diagnosis not present

## 2014-10-12 DIAGNOSIS — N183 Chronic kidney disease, stage 3 (moderate): Secondary | ICD-10-CM | POA: Diagnosis not present

## 2014-10-12 NOTE — Telephone Encounter (Signed)
Edwin Woodward from [facility], left VM, to notify Dr. Derrel Nip pt has met his PT goals, in a walking program for COPD. Pt has had no falls, no pain, no SOB, no loss of balance with hearing aid in. Pt able to walk 20 minutes without stopping.  This is an FYI only.

## 2014-10-13 DIAGNOSIS — I129 Hypertensive chronic kidney disease with stage 1 through stage 4 chronic kidney disease, or unspecified chronic kidney disease: Secondary | ICD-10-CM | POA: Diagnosis not present

## 2014-10-13 DIAGNOSIS — F0390 Unspecified dementia without behavioral disturbance: Secondary | ICD-10-CM | POA: Diagnosis not present

## 2014-10-13 DIAGNOSIS — F329 Major depressive disorder, single episode, unspecified: Secondary | ICD-10-CM | POA: Diagnosis not present

## 2014-10-13 DIAGNOSIS — N183 Chronic kidney disease, stage 3 (moderate): Secondary | ICD-10-CM | POA: Diagnosis not present

## 2014-10-13 DIAGNOSIS — F419 Anxiety disorder, unspecified: Secondary | ICD-10-CM | POA: Diagnosis not present

## 2014-10-13 DIAGNOSIS — J441 Chronic obstructive pulmonary disease with (acute) exacerbation: Secondary | ICD-10-CM | POA: Diagnosis not present

## 2014-10-21 DIAGNOSIS — F0390 Unspecified dementia without behavioral disturbance: Secondary | ICD-10-CM | POA: Diagnosis not present

## 2014-10-21 DIAGNOSIS — N183 Chronic kidney disease, stage 3 (moderate): Secondary | ICD-10-CM | POA: Diagnosis not present

## 2014-10-21 DIAGNOSIS — F419 Anxiety disorder, unspecified: Secondary | ICD-10-CM | POA: Diagnosis not present

## 2014-10-21 DIAGNOSIS — I129 Hypertensive chronic kidney disease with stage 1 through stage 4 chronic kidney disease, or unspecified chronic kidney disease: Secondary | ICD-10-CM | POA: Diagnosis not present

## 2014-10-21 DIAGNOSIS — J441 Chronic obstructive pulmonary disease with (acute) exacerbation: Secondary | ICD-10-CM | POA: Diagnosis not present

## 2014-10-21 DIAGNOSIS — F329 Major depressive disorder, single episode, unspecified: Secondary | ICD-10-CM | POA: Diagnosis not present

## 2014-10-23 ENCOUNTER — Ambulatory Visit: Payer: Medicare Other | Admitting: Internal Medicine

## 2014-10-24 DIAGNOSIS — G4733 Obstructive sleep apnea (adult) (pediatric): Secondary | ICD-10-CM | POA: Diagnosis not present

## 2014-10-24 DIAGNOSIS — R410 Disorientation, unspecified: Secondary | ICD-10-CM | POA: Diagnosis not present

## 2014-10-24 DIAGNOSIS — R251 Tremor, unspecified: Secondary | ICD-10-CM | POA: Diagnosis not present

## 2014-10-24 DIAGNOSIS — R262 Difficulty in walking, not elsewhere classified: Secondary | ICD-10-CM | POA: Diagnosis not present

## 2014-10-30 ENCOUNTER — Ambulatory Visit: Payer: Medicare Other | Admitting: Internal Medicine

## 2014-11-01 ENCOUNTER — Other Ambulatory Visit: Payer: Medicare Other

## 2014-11-01 ENCOUNTER — Other Ambulatory Visit: Payer: Self-pay

## 2014-11-01 ENCOUNTER — Other Ambulatory Visit: Payer: Self-pay | Admitting: Cardiovascular Disease

## 2014-11-01 DIAGNOSIS — E785 Hyperlipidemia, unspecified: Secondary | ICD-10-CM

## 2014-11-01 DIAGNOSIS — I2510 Atherosclerotic heart disease of native coronary artery without angina pectoris: Secondary | ICD-10-CM

## 2014-11-14 ENCOUNTER — Ambulatory Visit: Payer: Self-pay | Admitting: Neurology

## 2014-11-14 DIAGNOSIS — G4733 Obstructive sleep apnea (adult) (pediatric): Secondary | ICD-10-CM | POA: Diagnosis not present

## 2014-11-19 DIAGNOSIS — G4733 Obstructive sleep apnea (adult) (pediatric): Secondary | ICD-10-CM | POA: Diagnosis not present

## 2014-11-24 ENCOUNTER — Ambulatory Visit (INDEPENDENT_AMBULATORY_CARE_PROVIDER_SITE_OTHER): Payer: Medicare Other | Admitting: Internal Medicine

## 2014-11-24 ENCOUNTER — Encounter: Payer: Self-pay | Admitting: Internal Medicine

## 2014-11-24 DIAGNOSIS — E669 Obesity, unspecified: Secondary | ICD-10-CM | POA: Diagnosis not present

## 2014-11-24 DIAGNOSIS — N179 Acute kidney failure, unspecified: Secondary | ICD-10-CM

## 2014-11-24 DIAGNOSIS — G252 Other specified forms of tremor: Secondary | ICD-10-CM | POA: Diagnosis not present

## 2014-11-24 DIAGNOSIS — M10072 Idiopathic gout, left ankle and foot: Secondary | ICD-10-CM

## 2014-11-24 DIAGNOSIS — M25572 Pain in left ankle and joints of left foot: Secondary | ICD-10-CM | POA: Diagnosis not present

## 2014-11-24 DIAGNOSIS — E559 Vitamin D deficiency, unspecified: Secondary | ICD-10-CM

## 2014-11-24 DIAGNOSIS — I2510 Atherosclerotic heart disease of native coronary artery without angina pectoris: Secondary | ICD-10-CM | POA: Diagnosis not present

## 2014-11-24 LAB — SEDIMENTATION RATE: Sed Rate: 19 mm/hr (ref 0–22)

## 2014-11-24 MED ORDER — COLCHICINE 0.6 MG PO TABS: 0.6000 mg | ORAL_TABLET | Freq: Every day | ORAL | Status: DC

## 2014-11-24 MED ORDER — MAGNESIUM OXIDE 400 MG PO CAPS: 1.0000 | ORAL_CAPSULE | Freq: Two times a day (BID) | ORAL | Status: DC

## 2014-11-24 NOTE — Progress Notes (Signed)
Patient ID: Edwin Woodward, male   DOB: 1951/08/17, 64 y.o.   MRN: [id number]  Patient Active Problem List   Diagnosis Date Noted  . Gout of foot 11/26/2014  . Generalized weakness 08/26/2014  . Hypomagnesemia 08/26/2014  . Intention tremor 06/07/2014  . Cognitive complaints with normal exam 06/07/2014  . Left sided sciatica 06/07/2014  . Musculoskeletal neck pain 01/19/2014  . Allergic rhinitis 12/04/2013  . Thalassemia 08/16/2011  . Tendonitis of wrist, right 08/16/2011  . GAD (generalized anxiety disorder) 05/14/2011  . Obesity 04/11/2011  . OSA on CPAP 04/11/2011  . Hyperlipidemia 11/22/2009  . HYPERTENSION, BENIGN 11/22/2009  . CAD, NATIVE VESSEL 11/22/2009    Subjective:  CC:   Chief Complaint  Patient presents with  . Follow-up    From 10/10/14    HPI:   Edwin Woodward is a 64 y.o. male who presents for  Follow up on chronic and subacute issues that resulted in patient  being sent to  ER for readmission in mid December,, this time  for profound generalized weakness secondary to severe magnesium depletion (mg was 0.2 ) .  Patient has been home several weeks and has been under the care of his ex wife Harmon Pier,  And her fiancee.  He is feeling much better,  Ambulating without assistance and sleepig better. He has not had his Mg level checked since discharge a month ago, and has been taking an OTC Mg supplement    His cc is Recurrent left foot pain ,  Involving the forefoot and midfoot, He is worried about recurrence of gout, since his first hospitalization started with foot pain and he was treated initially for  septic joint and only eventually was treared for gout after considerable time was wasted, resulting in deterioration of condition d and  overuse of narcotics. The foot is not warm or red,  It just hurts ,  On a scale of 10,  A 4,. Per patient    Past Medical History  Diagnosis Date  . CAD (coronary artery disease)   . Presence of bare metal stent in right  coronary artery     proximal RCA adn LAD in April 2009  . Hyperlipidemia   . Hypertension   . Cardiovascular stress test abnormal     Jan 2010 no significant ischemia  . Trigger finger     Past Surgical History  Procedure Laterality Date  . Replacement total knee    . Coronary artery bypass graft      2 vessel  . Joint replacement      total knee  . Appendectomy    . Carpal tunnel release    . Vasectomy    . Coronary angioplasty  12/2007  . Appendectomy    . Tonsillectomy    . Cataract extraction      right       The following portions of the patient's history were reviewed and updated as appropriate: Allergies, current medications, and problem list.    Review of Systems:   Patient denies headache, fevers, malaise, unintentional weight loss, skin rash, eye pain, sinus congestion and sinus pain, sore throat, dysphagia,  hemoptysis , cough, dyspnea, wheezing, chest pain, palpitations, orthopnea, edema, abdominal pain, nausea, melena, diarrhea, constipation, flank pain, dysuria, hematuria, urinary  Frequency, nocturia, numbness, tingling, seizures,  Focal weakness, Loss of consciousness,  Tremor, insomnia, depression, anxiety, and suicidal ideation.     History   Social History  . Marital Status: Married    Spouse  Name: N/A  . Number of Children: N/A  . Years of Education: N/A   Occupational History  . Not on file.   Social History Main Topics  . Smoking status: Former Research scientist (life sciences)  . Smokeless tobacco: Never Used  . Alcohol Use: No  . Drug Use: Yes    Special: Marijuana  . Sexual Activity: Not on file   Other Topics Concern  . Not on file   Social History Narrative   Lives with wife.    Objective:  Filed Vitals:   11/24/14 1428  BP: 138/66  Pulse: 52  Temp: 97.5 F (36.4 C)  Resp: 14     General appearance: alert, cooperative and appears stated age Ears: normal TM's and external ear canals both ears Throat: lips, mucosa, and tongue normal; teeth  and gums normal Neck: no adenopathy, no carotid bruit, supple, symmetrical, trachea midline and thyroid not enlarged, symmetric, no tenderness/mass/nodules Back: symmetric, no curvature. ROM normal. No CVA tenderness. Lungs: clear to auscultation bilaterally Heart: regular rate and rhythm, S1, S2 normal, no murmur, click, rub or gallop Abdomen: soft, non-tender; bowel sounds normal; no masses,  no organomegaly Pulses: 2+ and symmetric Skin: Skin color, texture, turgor normal. No rashes or lesions Lymph nodes: Cervical, supraclavicular, and axillary nodes normal.  Assessment and Plan:  Obesity He has lost 17 lbs since November,  Body mass index is 29.34 kg/(m^2).   Wt Readings from Last 3 Encounters:  11/24/14 171 lb (77.565 kg)  10/10/14 173 lb 8 oz (78.699 kg)  07/21/14 188 lb (85.276 kg)  I have congratulated him in reduction of   BMI and encouraged  Continued weight loss with goal of 10% of body weigh over the next 6 months using a low glycemic index diet and regular exercise a minimum of 5 days per week.     Intention tremor Resolved,  With a fine resting tremor still noted on exam,    Gout of foot Current symptoms do not suggest recurrence but given patient's history of prior workup and improvement with Colchrys,  Refill given to use if painb escalates.     Updated Medication List Outpatient Encounter Prescriptions as of 11/24/2014  Medication Sig  . ALPRAZolam (XANAX XR) 0.5 MG 24 hr tablet Take 1 tablet (0.5 mg total) by mouth daily.  Marland Kitchen atorvastatin (LIPITOR) 40 MG tablet Take 40 mg alt with 20 mg daily.  Marland Kitchen buPROPion (WELLBUTRIN SR) 150 MG 12 hr tablet Take one tablet in the am & one tablet @@ 2 pm daily.  . calcium carbonate 1250 MG capsule Take 1,250 mg by mouth 2 (two) times daily with a meal.  . clindamycin (CLEOCIN) 300 MG capsule 2 CAPSULES ONE HOUR BEFORE DENTAL PROCEDURE  . diclofenac sodium (VOLTAREN) 1 % GEL Apply 1 application topically as needed.  .  magnesium gluconate (MAGONATE) 500 MG tablet Take 500 mg by mouth 2 (two) times daily.  . metoprolol tartrate (LOPRESSOR) 25 MG tablet Take 1 tablet (25 mg total) by mouth 2 (two) times daily.  . nitroGLYCERIN (NITROSTAT) 0.4 MG SL tablet Place 1 tablet (0.4 mg total) under the tongue every 5 (five) minutes as needed.  . pantoprazole (PROTONIX) 40 MG tablet TAKE 1 TABLET (40 MG TOTAL) BY MOUTH DAILY.  Marland Kitchen PARoxetine (PAXIL-CR) 25 MG 24 hr tablet Take 1 tablet (25 mg total) by mouth every morning.  Marland Kitchen PLAVIX 75 MG tablet TAKE 1 TABLET (75 MG TOTAL) BY MOUTH DAILY.  Marland Kitchen QUEtiapine (SEROQUEL) 25 MG tablet Take 50  mg by mouth at bedtime.  Marland Kitchen alfuzosin (UROXATRAL) 10 MG 24 hr tablet Take 10 mg by mouth daily.    . colchicine (COLCRYS) 0.6 MG tablet Take 1 tablet (0.6 mg total) by mouth daily.  . Magnesium Oxide 400 MG CAPS Take 1 capsule (400 mg total) by mouth 2 (two) times daily after a meal.  A total of 25 minutes of face to face time was spent with patient more than half of which was spent in counselling about the above mentioned conditions  and coordination of care   Orders Placed This Encounter  Procedures  . Comp Met (CMET)  . Sedimentation rate  . C-reactive protein  . Vit D  25 hydroxy (rtn osteoporosis monitoring)  . Magnesium    No Follow-up on file.

## 2014-11-24 NOTE — Progress Notes (Signed)
Pre-visit discussion using our clinic review tool. No additional management support is needed unless otherwise documented below in the visit note.  

## 2014-11-24 NOTE — Patient Instructions (Signed)
I have sent an rx for colchrys to your pharmacy,  Please take it TWICE DAILY  IF YOUR GOUT FLARES  Please start the magnesium oxide tablets instead of the OTC supplements,  ONCE DAILY

## 2014-11-26 DIAGNOSIS — M109 Gout, unspecified: Secondary | ICD-10-CM | POA: Insufficient documentation

## 2014-11-26 NOTE — Assessment & Plan Note (Signed)
Resolved,  With a fine resting tremor still noted on exam,

## 2014-11-26 NOTE — Assessment & Plan Note (Signed)
Current symptoms do not suggest recurrence but given patient's history of prior workup and improvement with Colchrys,  Refill given to use if painb escalates.

## 2014-11-26 NOTE — Assessment & Plan Note (Signed)
He has lost 17 lbs since November,  Body mass index is 29.34 kg/(m^2).   Wt Readings from Last 3 Encounters:  11/24/14 171 lb (77.565 kg)  10/10/14 173 lb 8 oz (78.699 kg)  07/21/14 188 lb (85.276 kg)  I have congratulated him in reduction of   BMI and encouraged  Continued weight loss with goal of 10% of body weigh over the next 6 months using a low glycemic index diet and regular exercise a minimum of 5 days per week.

## 2014-11-26 NOTE — Assessment & Plan Note (Signed)
Severe and critically low in December by testing in ER ,  Mg was 0.2,  Etiology unclear , but likely the cause of his weakness .  He was admitted for supplementation and was discharged on supplements .

## 2014-11-27 LAB — COMPREHENSIVE METABOLIC PANEL
ALT: 8 U/L (ref 0–53)
AST: 16 U/L (ref 0–37)
Albumin: 4.4 g/dL (ref 3.5–5.2)
Alkaline Phosphatase: 65 U/L (ref 39–117)
BILIRUBIN TOTAL: 0.6 mg/dL (ref 0.2–1.2)
BUN: 34 mg/dL — ABNORMAL HIGH (ref 6–23)
CO2: 24 meq/L (ref 19–32)
CREATININE: 1.7 mg/dL — AB (ref 0.40–1.50)
Calcium: 10.1 mg/dL (ref 8.4–10.5)
Chloride: 108 mEq/L (ref 96–112)
GFR: 43.27 mL/min — AB (ref >60.00)
GLUCOSE: 92 mg/dL (ref 70–99)
Potassium: 4.7 mEq/L (ref 3.5–5.1)
SODIUM: 144 meq/L (ref 135–145)
Total Protein: 7.1 g/dL (ref 6.0–8.3)

## 2014-11-27 LAB — VITAMIN D 25 HYDROXY (VIT D DEFICIENCY, FRACTURES): VITD: 35.28 ng/mL (ref 30.00–100.00)

## 2014-11-27 LAB — MAGNESIUM: Magnesium: 1.9 mg/dL (ref 1.5–2.5)

## 2014-11-27 LAB — C-REACTIVE PROTEIN: CRP: 0.1 mg/dL — AB (ref 0.5–20.0)

## 2014-11-29 ENCOUNTER — Ambulatory Visit: Payer: Self-pay | Admitting: Neurology

## 2014-11-29 DIAGNOSIS — G4761 Periodic limb movement disorder: Secondary | ICD-10-CM | POA: Diagnosis not present

## 2014-11-29 DIAGNOSIS — G4733 Obstructive sleep apnea (adult) (pediatric): Secondary | ICD-10-CM | POA: Diagnosis not present

## 2014-11-29 NOTE — Addendum Note (Signed)
Addended by: [name] on: 11/29/2014 07:10 AM   Modules accepted: Orders

## 2014-12-06 DIAGNOSIS — N433 Hydrocele, unspecified: Secondary | ICD-10-CM | POA: Diagnosis not present

## 2014-12-06 DIAGNOSIS — M109 Gout, unspecified: Secondary | ICD-10-CM | POA: Diagnosis not present

## 2014-12-06 DIAGNOSIS — N401 Enlarged prostate with lower urinary tract symptoms: Secondary | ICD-10-CM | POA: Diagnosis not present

## 2014-12-06 DIAGNOSIS — R972 Elevated prostate specific antigen [PSA]: Secondary | ICD-10-CM | POA: Diagnosis not present

## 2014-12-06 DIAGNOSIS — R339 Retention of urine, unspecified: Secondary | ICD-10-CM | POA: Diagnosis not present

## 2014-12-06 DIAGNOSIS — N411 Chronic prostatitis: Secondary | ICD-10-CM | POA: Diagnosis not present

## 2014-12-06 DIAGNOSIS — I2510 Atherosclerotic heart disease of native coronary artery without angina pectoris: Secondary | ICD-10-CM | POA: Diagnosis not present

## 2014-12-07 ENCOUNTER — Other Ambulatory Visit (INDEPENDENT_AMBULATORY_CARE_PROVIDER_SITE_OTHER): Payer: Medicare Other

## 2014-12-07 DIAGNOSIS — N179 Acute kidney failure, unspecified: Secondary | ICD-10-CM

## 2014-12-07 LAB — BASIC METABOLIC PANEL
BUN: 21 mg/dL (ref 6–23)
CHLORIDE: 107 meq/L (ref 96–112)
CO2: 29 meq/L (ref 19–32)
Calcium: 9.6 mg/dL (ref 8.4–10.5)
Creatinine, Ser: 1.49 mg/dL (ref 0.40–1.50)
GFR: 50.38 mL/min — AB (ref >60.00)
Glucose, Bld: 104 mg/dL — ABNORMAL HIGH (ref 70–99)
Potassium: 4 mEq/L (ref 3.5–5.1)
Sodium: 141 mEq/L (ref 135–145)

## 2014-12-10 DIAGNOSIS — G4761 Periodic limb movement disorder: Secondary | ICD-10-CM | POA: Diagnosis not present

## 2014-12-10 DIAGNOSIS — G4733 Obstructive sleep apnea (adult) (pediatric): Secondary | ICD-10-CM | POA: Diagnosis not present

## 2014-12-11 ENCOUNTER — Encounter: Payer: Self-pay | Admitting: *Deleted

## 2014-12-21 ENCOUNTER — Other Ambulatory Visit: Payer: Self-pay | Admitting: Internal Medicine
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
## 2014-12-30 NOTE — Consult Note (Signed)
Brief Consult Note: Diagnosis: Probable gout left ankle.  r/o sepsis.   Patient was seen by consultant.   Recommend further assessment or treatment.   Orders entered.   Comments: 64 year old male jammed his left ankle about a week ago and developed pain and swelling of the ankle over a few days time.  No fevers or chills. Ankle was warm and painful.  Seen in Emergency Room last night and admitted for possible infection versus gout.  Started on IV vancomycin.  Says he still has pain and swelling with warmth. white blood count normal and afebrile.  uric acid 4.4  Early blood cultures negative. Saw a male MD at [facility] for urine problems prior to jamming the ankle. Also on Plavix for cardiac stent.   Exam:  Alert and oriented.  Poor historian.  Bilateral total knee replacements with no reddness or swelling. No pain.  Right ankle normal.  Left ankle and foot warm and mildly swollen.  No reddness or cellulitis.  range of motion fairly good with mild pain.  circulation/sensation/motor function good.   X-rays: No fracture or infection.   Imp: Probable gout.    Rx:  continue antibiotics        colchicine 0.6mg  q6h.  Electronic Signatures: Park Breed (MD)  (Signed 21-Nov-15 13:25)  Authored: Brief Consult Note   Last Updated: 21-Nov-15 13:25 by Park Breed (MD)

## 2014-12-30 NOTE — H&P (Signed)
PATIENT NAME:  Edwin Woodward, [name]:  [id number] DATE OF BIRTH:  12/13/1950  DATE OF ADMISSION:  08/24/2014  PRIMARY CARE PHYSICIAN: Deborra Medina, MD  CHIEF COMPLAINT: Generalized weakness and left ankle pain.   HISTORY OF PRESENT ILLNESS: This is a 64 year old Caucasian male patient who has a history of hypertension, hyperlipidemia and CAD who presented to the Emergency Room sent in by his primary care physician for generalized weakness and left ankle pain. The patient was recently in the hospital for the same and was discharged to rehab on 08/02/2014 with left ankle pain with possible tenosynovitis and likely gout. The patient was discharged on prednisone 20 mg once a day for 5 days which he has finished.   Presently, the patient has had falls and is unable to get out of bed and has generalized weakness. His pain in the left ankle has only mildly improved and is still causing significant disability with his walking. He has no swelling or redness. No other joint involved.   Here in the Emergency Room, the patient has been found to have a low calcium of 6.7, magnesium less than 0.3. He is being admitted to the hospitalist service.   PAST MEDICAL HISTORY:  1.  Hypertension.  2.  CAD. 3.  Hyperlipidemia.  4.  Anxiety.  5.  Depression.  6.  BPH.   SOCIAL HISTORY: The patient does not smoke. No alcohol. No illicit drugs. Lives at home with his wife. Using a walker to a minute at this time.   FAMILY HISTORY: Positive for hypertension.   ALLERGIES: BIAXIN, CODEINE, CONTRAST DYE, PENICILLIN, PROMETHAZINE.   REVIEW OF SYSTEMS:  CONSTITUTIONAL: Complains of fatigue and weakness.  EYES: No blurred vision, pain or redness.  ENT: No tinnitus, ear pain or hearing loss.  RESPIRATORY: No cough, wheeze or hemoptysis.  CARDIOVASCULAR: No chest pain, orthopnea or edema.  GASTROINTESTINAL: No nausea, vomiting, diarrhea or abdominal pain.  GENITOURINARY: No dysuria, hematuria or frequency.   ENDOCRINE: No polyuria, nocturia or thyroid problems.  HEMATOLOGIC AND LYMPHATIC: No anemia or easy bruising or bleeding.  INTEGUMENTARY: No acne, rash or lesion.  MUSCULOSKELETAL: Has pain in his left ankle.  NEUROLOGIC: No focal numbness. Has generalized weakness.  PSYCHIATRIC: Has anxiety and depression.    HOME MEDICATIONS:  1.  Alprazolam 1 mg half a tablet daily.  2.  Atenolol 25 mg half a tablet daily.  3.  Diclofenac topical gel as needed.  4.  Levofloxacin 500 mg daily.   5.  Lipitor 20 mg daily.  6.  Metoprolol tartrate 25 mg 2 times a day.   7.  Nitro 0.4 sublingual as needed.  9.  Plavix 75 mg daily.  10. Protonix 40 mg daily.  11. Seroquel 25 mg 2 tablets oral once a day.  12. Wellbutrin SR 150 mg oral 2 times a day.   PHYSICAL EXAMINATION:  VITAL SIGNS: Temperature 97.9, pulse 75, blood pressure 125/87, saturating 96% on room air.  GENERAL: Obese Caucasian male patient lying in bed, overall seems comfortable and conversational.  PSYCHIATRIC: Alert and oriented x3. Mood and affect are appropriate. Judgment intact.  HEENT: Atraumatic, normocephalic. Oral mucosa moist and pink. External ears and nose normal. No pallor. No icterus. Pupils are bilaterally equal and reactive to light.  NECK: Supple. No thyromegaly No palpable lymph nodes. Trachea midline. No carotid bruit or JVD.  CARDIOVASCULAR: S1, S2, without any murmurs. Peripheral pulses 2+. No edema.  RESPIRATORY: Normal work of breathing. Clear to auscultation on both sides.  GASTROINTESTINAL: Soft abdomen, nontender. Bowel sounds present. No organomegaly palpable.  SKIN: Warm and dry. No petechiae, rash or ulcers.  MUSCULOSKELETAL: No joint swelling, redness or effusion of the large joints. Normal muscle tone. Has tenderness literally of his left ankle with normal range of motion. No swelling or redness noticed.  NEUROLOGIC: Motor strength 5/5 in the upper and lower extremities. Sensation is intact all over.   LYMPHATIC: No cervical lymphadenopathy.   LABORATORY STUDIES: Glucose 107, BUN 20, creatinine 1.81, sodium 140, potassium 3.3, chloride 103, calcium 6.4. AST, ALT, alkaline phosphatase, bilirubin normal. Albumin 3.1. Troponin less than 0.02. WBC 8.1, hemoglobin 9.6, platelets 201,000, MCV 63.   EKG: Shows a normal sinus rhythm with right bundle branch block and left anterior fascicular block. Unchanged from prior EKG.   CHEST X-RAY: Shows nothing acute.   ASSESSMENT AND PLAN:  1.  Severe hypomagnesemia along with concurrent hypocalcemia and hypokalemia. At this point, the etiology is unclear. The patient has not had any diarrhea. No decreased intake. Creatinine is improved from his recent admission. Possibly, he is in the diuretic phase from his acute renal failure which could explain the loss of electrolytes. We will replace aggressively with IV magnesium of 6 grams along with oral supplementation. The patient is at high risk for cardiac arrest and arrhythmias and needs to be on a telemetry floor. I have discussed the critical nature of his illness with the patient. We will consult nephrology for further input to see if he has any other rare diseases causing this problem. The patient is not on any diuretics. His prednisone was only briefly for 5 days in November. For his calcium levels, we will replace with IV calcium. Check vitamin D and PTH levels. Put him on oral magnesium and calcium along with potassium daily. Monitor closely.  2.  Hypertension. Continue medication.  3.  Chronic kidney disease, stage III, stable.  4.  Left ankle pain. The patient's pain is likely secondary from the tenosynovitis which was diagnosed recently. There is no swelling or redness.  5.  Anemia of chronic disease, stable.  6.  Deep vein thrombosis prophylaxis with heparin.   CODE STATUS: Full code.   TIME SPENT  TODAY ON THIS CASE AND CRITICAL CARE TIME WITH SEVERE HYPOMAGNESEMIA AND HYPOCALCEMIA: 40 minutes.      ____________________________ Leia Alf Cailan General, MD srs:JT D: 08/24/2014 16:46:40 ET T: 08/24/2014 18:02:09 ET JOB#: [id number]  cc: Alveta Heimlich R. Alexis Reber, MD, <Dictator> Deborra Medina, MD Neita Carp MD ELECTRONICALLY SIGNED 08/25/2014 12:38

## 2014-12-30 NOTE — Discharge Summary (Signed)
PATIENT NAME:  Edwin Woodward, Edwin Woodward MR#:  [id number] DATE OF BIRTH:  1950/11/12  DATE OF ADMISSION:  07/28/2014 DATE OF DISCHARGE:  08/02/2014   ADMITTING DIAGNOSES: 1.  Monoarticular joint pain localized to the left ankle.  2.  Hypokalemia.  3.  Coronary artery disease status post percutaneous coronary intervention and stent placement.   DISCHARGE DIAGNOSES: 1.  Tenosynovitis of left ankle, probably crystalline, likely gout.  2.  Acute kidney injury  probably acute tubular necrosis nonsteroidal antiinflammatory drug induced.  3.  Hypertension. Blood pressure is elevated.  4.  Hypokalemia. Replaced with potassium.  5.  Coronary artery disease, status post stent placement.   PROCEDURES: None.   CONSULTATIONS: Nephrology, Dr. Candiss Norse, rheumatology Dr. Lelon Huh, orthopedics, Dr. Sabra Heck.   BRIEF HISTORY AND PHYSICAL AND HOSPITAL COURSE: The patient is a 64 year old Caucasian male who came in to the ED with a chief complaint of left ankle pain. Please review history and physical for details. He was having tender radiation of left ankle pain, which has gradually worsened and seen by primary care physician. The patient was given nonsteroidal anti-inflammatory drugs by the PCP and recommended to rest. There is no significant improvement. The patient was having difficulty with ambulation.  The patient came into the ED.  Denies any fever or chills.  In the ED, there was a concern of septic arthritis. The patient was given vancomycin in the Emergency Department. Blood cultures were sent.     HOSPITAL COURSE: For monoarticular joint pain left ankle, the  patient is admitted.  Orthopedics were consulted. As blood cultures were negative and the patient was afebrile, his vancomycin was discontinued. The patient was evaluated by Dr. Sabra Heck who thought it is probably from gout and the patient is started on colchicine. IV Solu-Medrol also initiated. As the patient's renal function has gotten worse, colchicine was  discontinued. Vancomycin was also discontinued, as the patient was afebrile and the blood cultures were negative. With steroids, his tenderness, edema and clinical condition significantly improved. The patient was evaluated by rheumatology Dr. Lelon Huh, who thinks it is tenosynovitis, probably crystalline. If recurs again, he has recommended to get joint fluid via arthrocentesis. Physical therapy was consulted and the patient started ambulating with someone's assistance. PT has recommended skilled nursing care. CM was consulted regarding placement   Acute kidney injury, probably acute tubular necrosis from nephrotoxins, probably nonsteroidal anti-inflammatory drugs. At the  time of admission, the patient's renal function was within the normal range. Subsequently, it crept up to 2.47. The patient was evaluated by Dr. Murlean Iba of nephrology.  Random urine protein and creatinine were ordered, as acute kidney injury is not prerenal, IV fluids were discontinued. ANA was ordered, which is pending at this time. Renal ultrasound did not reveal any hydronephrosis or any significant findings. Nephrology has recommended the patient to follow up with them as an outpatient in 1 week after discharge.   Hypokalemia. Replace with potassium.   Hypertension. Blood pressure was elevated, which was assumed to be from steroids and IV fluids. IV fluids were discontinued, IV steroids were changed to p.o. and slowly tapered. The patient's atenolol dose is increased to 25 mg p.o. once daily.   Coronary artery disease.  The patient did not have any symptoms, chest pain-free, asymptomatic, so no interventions were provided. The patient is to continue his home medications atenolol, Plavix and statin.   PHYSICAL EXAMINATION: VITAL SIGNS: Temperature 98.3, pulse 59, respiratory rate 18, blood pressure is 154/72, pulse oximetry 94% at rest. His  code status is full code.   FOLLOWUP:  Followup appointment primary care physician in  a week, Dr. Murlean Iba, nephrology in a week, [facility] Fil[facility] orthopedics in 2 weeks or as needed, rheumatology Dr. Lelon Huh in 1 week,   SIGNIFICANT LABORATORIES AND IMAGING STUDIES: On 20th of November, BUN and creatinine are normal.  On 25th of November, BUN is at 43, creatinine 2.47, glucose 85, sodium 148, potassium 3.1, chloride 1155, anion gap is 9. Serum osmolality normal, calcium is 7.8. The patient's albumin is at 2.6. WBC 9.0, hemoglobin 9.5, hematocrit 29.4, platelets are 347,000. Urine creatinine 67.3, protein creatinine ratio is 208, random urine protein is 14.  Blood cultures no growth in 5 days x2.   Urinalysis:  Yellow in color, nitrites and leukocyte esterase are negative. Ultrasound of the kidneys bilaterally: Negative for hydronephrosis, parenchymal echogenicity within normal limits. Right perinephric fluid is nonspecific, but can be seen in the setting of acute nephritis.   MEDICATIONS AT THE TIME OF DISCHARGE: Wellbutrin 150 mg 1 tablet p.o. 2 times a day, Lipitor 40 mg once daily at bedtime, Protonix 40 mg once daily. Uroxatral 10 mg 1 tablet p.o. once daily, lisinopril 20 mg 2 tablets p.o. at bedtime, Paxil 20 mg extended release 1 tablet p.o. once daily, Plavix 75 mg once a day, alprazolam extended release 1 mg p.o. once a day, atenolol 20 mg p.o. once daily, prednisone 20 mg 1 tablet p.o. once daily for 5 days, Tylenol 325 mg 2 tablets p.o. every 4 hours as needed for mild pain, Colace 100 mg p.o. 2 times a day, Percocet 5/325 one tablet p.o. every 6 hours as needed for moderate to severe pain.   DIET: Low fat, low cholesterol.   ACTIVITY: As recommended by physical therapy.  The diagnosis and plan of care were discussed in detail with the patient. He verbalized understanding of the plan.   TOTAL TIME SPENT ON THE DISCHARGE: 45 minutes.   DISPOSITION:  The patient is transferred to the skilled nursing facility, [facility].    ____________________________ Nicholes Mango,  MD ag:DT D: 08/03/2014 09:51:15 ET T: 08/03/2014 11:41:15 ET JOB#: [id number]  cc: Nicholes Mango, MD, <Dictator> Nicholes Mango MD ELECTRONICALLY SIGNED 08/04/2014 18:16
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
## 2014-12-30 NOTE — Consult Note (Signed)
Brief Consult Note: Diagnosis: probable gout, left ankle.   Patient was seen by consultant.   Recommend to proceed with surgery or procedure.   Orders entered.   Comments: plan aspiration and injection in a.m.  Electronic Signatures: Laurene Footman (MD)  (Signed 18-Dec-15 17:04)  Authored: Brief Consult Note   Last Updated: 18-Dec-15 17:04 by Laurene Footman (MD)

## 2014-12-30 NOTE — Consult Note (Signed)
PATIENT NAME:  Edwin Woodward, MUSICH MR#:  [id number] DATE OF BIRTH:  Nov 27, 1950  DATE OF CONSULTATION:  08/25/2014  REFERRING PHYSICIAN:   CONSULTING PHYSICIAN:  Leotis Pain, MD  REASON FOR CONSULTATION:   Generalized weakness. Rule out motor neuron disease.  HISTORY OF PRESENT ILLNESS:  A 64 year old Caucasian male with past medical history of hypertension, hyperlipidemia, coronary artery disease, presents to the Emergency Department with generalized weakness, recently admitted with history of left ankle pain, started on steroids suspicion of tenosynovitis and possibly gout. The patient admitted because of inability to get out of bed due to generalized weakness in the lower extremities. Upon admission, the patient was found to be severely hypomagnesemic, low calcium and low potassium, status post replacement now. Symptoms have significantly improved. The patient is close to baseline.   PAST MEDICAL HISTORY: Hypertension, coronary artery disease, hyperlipidemia, anxiety, depression, benign prostatic hypertrophy,   SOCIAL HISTORY: Does not smoke, does not drink any alcohol. No illicit drug use. Lives at home with wife.   FAMILY HISTORY: Positive for hypertension.   ALLERGIES: INCLUDE BIAXIN, CODEINE, CONTRAST, PENICILLIN.   REVIEW OF SYSTEMS:  Generalized fatigue, worse in the lower extremities. No blurry vision. No visual changes. No abdominal pain. No heat or cold intolerance. Generalized weakness. No weakness on one side of the body compared to the other. Positive for anxiety.   HOME MEDICATIONS: Reviewed include alprazolam, atenolol, levofloxacin, Lipitor, metoprolol, nitroglycerine, Plavix, Protonix, Seroquel, Wellbutrin.   PHYSICAL EXAMINATION:  VITAL SIGNS: Include a temperature of 98.5, pulse 74, respirations 20, blood pressure 135/71.  NEUROLOGIC: Extraocular movements intact. Visual fields intact. Facial sensation intact. Facial motor intact. Tongue is midline. Uvula elevates  symmetrically. Motor strength is 5/5 bilateral upper extremities, 4+/5 bilaterally lower extremities with great hip flexion, knee extension, slight weakness in the plantar dorsiflexion, which is 4-/5. Reflexes present. Coordination present. Sensation intact.   IMPRESSION: A 64 year old gentleman with past medical history of hypertension, hyperlipidemia, coronary artery disease, presents with generalized weakness, left ankle pain, which has improved. The patient was found to be severely hypomagnesemic, hypocalcemic and had low potassium. Currently, his symptoms have significantly improved. The patient thinks he is able to ambulate with limited assistance. Neurological question for motor neuron disease. Based on examination, no fasciculations were noted so I do not suspect this is amyotrophic lateral sclerosis and motor neuron disease does not improve so rapidly. The patient has tremors, specifically essential tremor. I think this is contributed to his history of anxiety and depression. At this point, will just continue physical therapy, replace electrolytes. No further imaging from a neurological standpoint. Again, at this point, I do not think this is a motor neuron disease. Please call with any questions. Thank you, it was a pleasure seeing this patient.    ____________________________ Leotis Pain, MD yz:kl D: 08/25/2014 17:09:04 ET T: 08/25/2014 17:41:49 ET JOB#: [id number]  cc: Leotis Pain, MD, <Dictator> Leotis Pain MD ELECTRONICALLY SIGNED 09/06/2014 13:51

## 2014-12-30 NOTE — Consult Note (Signed)
PATIENT NAME:  Edwin Woodward, Edwin Woodward MR#:  [id number] DATE OF BIRTH:  03/02/1951  DATE OF CONSULTATION:  08/26/2014  CONSULTING PHYSICIAN:  Laurene Footman, MD  REASON FOR CONSULTATION: Left ankle pain.   HISTORY OF PRESENT ILLNESS: The patient is a 64 year old who had been admitted a month ago with a similar episode of ankle pain. He has swelling and pain in his left ankle. He has had significant pain and difficulty walking secondary to this and resolved that during his last admission. He does have renal insufficiency and was not able to have anti-inflammatories or some of the other anti-gout medications. I was consulted for evaluation of his ankle pain. On exam, he has significant pain with passive range of motion of the ankle with mild ankle effusion present. Prior x-rays were reviewed and they show no significant degenerative changes. No evidence of acute process.   IMPRESSION: Gout, left ankle.   PLAN: :Left ankle injection. Activities as needed after this.    ____________________________ Laurene Footman, MD mjm:TT D: 08/26/2014 16:47:05 ET T: 08/26/2014 17:06:36 ET JOB#: [id number]  cc: Laurene Footman, MD, <Dictator> Laurene Footman MD ELECTRONICALLY SIGNED 08/26/2014 23:21

## 2014-12-30 NOTE — H&P (Signed)
PATIENT NAME:  Edwin Woodward, Edwin Woodward MR#:  [id number] DATE OF BIRTH:  [birth year]  DATE OF ADMISSION:  07/28/2014  REFERRING PHYSICIAN:  Karma Greaser   CHIEF COMPLAINT:  Left ankle pain.   HISTORY OF PRESENT ILLNESS: A 64 year old Caucasian gentleman with history of hypertension, hyperlipidemia, coronary artery disease status post PCI and stent placement presenting with left ankle pain, describes a 10 day duration of left ankle pain, gradually worsened, saw  PCP about 1 week ago, diagnosed with plantar fasciitis; treatment plan of rest, NSAIDs, and stretching; however, had no improvement, now 1 week duration of erythema, edema, localized around left ankle and associated pain, described as sharp, nonradiating, 10/10, in intensity, worse with movement, no relieving factors. Denies any fevers, chills; however, after continued and worsening symptoms decided to present to hospital for work-up and evaluation.   REVIEW OF SYSTEMS:   CONSTITUTIONAL: Denies fevers, chills, fatigue, weakness RESPIRATORY: denies shortness of breath, cough, wheeze CARDIOVASCULAR: No chest pain, palpitations, edema.  GASTROINTESTINAL: no nausea, vomiting, diarrhea, or abdominal pain. positive decreased po intake GENITOURINARY:  Denies dysuria, hematuria.   ENDOCRINE:  Denies nocturia or thyroid problems,  HEME/LYMPH denies easy bruising, bleeding.  SKIN: Denies rashes, lesions, other than erythematous area over left ankle as described above.  MUSCULOSKELETAL: Positive for pain in the left ankle described above; otherwise, denies any pain in neck, back, shoulder, knees, hips or arthritic symptoms.  NEUROLOGIC: Denies paralysis, paresthesia.  PSYCH: denies anxiety, depression  otherwise full review of systems performed by me was negative.   PAST MEDICAL HISTORY: Essential hypertension, hyperlipidemia, unspecified anxiety, depression not otherwise specified, BPH, coronary artery disease, coronary artery disease status post PCI and  stent placement.   SOCIAL HISTORY: Remote tobacco use, denies any alcohol or drug use.   FAMILY HISTORY: Positive for hypertension.   ALLERGIES: BIAXIN, CODEINE, IV CONTRAST DYE, PENICILLIN, PROMETHAZINE.   HOME MEDICATIONS: Include Uroxatral 10 mg p.o. daily, Paxil 25 mg p.o. daily, Lipitor 40 mg p.o. daily, Plavix 75 mg p.o. daily, Seroquel 25 mg 2 tabs p.o. at bedtime; alprazolam 1 mg p.o. daily, atenolol 25 mg 1/2 tablet p.o. daily; Protonix 40 mg p.o. daily, Wellbutrin 150 mg p.o. b.i.d.   PHYSICAL EXAMINATION: VITAL SIGNS: Temperature 98.3, heart rate 72, respirations 18, blood pressure 105/78, sat 100% on room air, weight 71.7 kg, BMI 27.1.  GENERAL: Well-nourished, well-developed, Caucasian gentleman currently in no acute distress.  HEAD: Normocephalic, atraumatic.  EYES: Pupils are equally round and reactive light, extraocular muscles intact. No scleral icterus.  MOUTH: Dry mucosal membranes. Dentition is intact, no abscess noted NECK: Supple. No thyromegaly. No lymphadenopathy. No JVD.  PULMONARY: Clear to auscultation bilaterally without wheeze or rhonchi. No use of accessory muscles. good respiratyory effort, chest non tender to palpation.  CARDIOVASCULAR: S1, S2, regular rate and rhythm, no murmur, rubs or gallops, no edema. Pedal pulses 2+ bilaterally.  GASTROINTESTINAL:  Soft, nontender, nondistended, positive bowel sounds no hepatosplenomegaly.  MUSCULOSKELETAL: There is swelling localized over the left ankle in both the medial and lateral aspect with surrounding erythema. He has limited range of motion in his the ankle secondary to pain in both flexion; extension, internal, external rotation; otherwise, range of motion is full in all other extremities.  NEUROLOGIC: Cranial nerves II through XII intact, no gross focal neurologic deficits, sensation intact, reflexes intact.   SKIN: No ulcerations or lesions. There are the erythematous areas around the left ankle as described  above; skin turgor intact.   PSYCHIATRIC: Mood and affect within normal, patient  awake, alert and oriented x 3, insight and judgment intact.   DIAGNOSTIC DATA: Sodium 140, potassium 2.9, chloride 100, , BUN 15; creatinine was 0.9,  LFTs: Albumin 3, otherwise within normal limits, wbc 13.1, ESR of 70, hemoglobin 11.1, platelets of 373,000; x-ray of the left foot performed, no acute bony findings including the ankle; CT head performed, no acute findings.   ASSESSMENT AND PLAN: A 64 year old gentleman with history of hypertension, hyperlipidemia, coronary artery disease status post PCI and stent placement, presents with left ankle pain of 10 day duration gradually worsening.  1.  Monoarticular joint pain localized to the left ankle: concern for septic arthritis. He received vancomycin in the Emergency Department, we will continue this antibiotic course for now.  he had blood cultures sent as well.  We will consult orthopedics. The case was discussed with them in Emergency Department. Arthrocentesis has not been performed at this time, . Provide pain control. Initiate bowel regimen and we will consult physical therapy as well. case discussed with orthopedics in ED 2.  Hypokalemia. Replace potassium, goal 4 to 5. Check magnesium level as well.  3.  Coronary artery disease status post percutaneous coronary intervention and stent placement. Continue Plavix and statin therapy as well as beta-blockade.  4. Gastroesophageal reflux disease. Proton pump inhibitor therapy.  5.  Venous thrombosis prophylaxis. Provided with heparin subcutaneous.   CODE STATUS: The patient is full code.   TIME SPENT: Was 45 minutes.    ____________________________ Aaron Mose. Anouk Critzer, MD dkh:nt D: 07/28/2014 21:33:49 ET T: 07/28/2014 22:47:24 ET JOB#: [id number]  cc: Aaron Mose. Aki Burdin, MD, <Dictator> Keandra Medero Woodfin Ganja MD ELECTRONICALLY SIGNED 07/29/2014 20:40

## 2014-12-30 NOTE — Op Note (Signed)
PATIENT NAME:  Edwin Woodward, Edwin Woodward MR#:  [id number] DATE OF BIRTH:  Oct 18, 1950  DATE OF PROCEDURE:  08/26/2014  REASON FOR CONSULTATION: Left ankle, presumed gout.   POSTOPERATIVE DIAGNOSIS: Left ankle, presumed gout.   PROCEDURE PERFORMED: Left ankle, intra-articular injection attempted aspiration.   ANESTHESIA: Local.   SURGEON: Laurene Footman, MD.   DESCRIPTION OF PROCEDURE: The patient was seen in his room. After informed consent had been obtained and timeout procedure completed, the anterior lateral aspect of the skin was prepped with Betadine. A 22-gauge needle was inserted into the knee and no fluid could be aspirated. Kenalog 40 mg 1 mL and 3 mL of Sensorcaine 0.5% were injected into the ankle and the needle withdrawn. The patient tolerated the procedure well and a Band-Aid was applied.   ESTIMATED BLOOD LOSS: Minimal.   COMPLICATIONS: None.   SPECIMEN: None.    ____________________________ Laurene Footman, MD mjm:mw D: 08/26/2014 16:45:45 ET T: 08/26/2014 17:22:20 ET JOB#: [id number]  cc: Laurene Footman, MD, <Dictator> Laurene Footman MD ELECTRONICALLY SIGNED 08/26/2014 23:21

## 2015-01-03 NOTE — Discharge Summary (Signed)
PATIENT NAME:  Edwin Woodward, Edwin Woodward MR#:  [id number] DATE OF BIRTH:  [birth year]  DATE OF ADMISSION:  08/24/2014 DATE OF DISCHARGE:  09/05/2014   DISCHARGE DIAGNOSES:  1.  Bilateral basal pneumonia.  2.  Acute respiratory failure.  3.  Acute on chronic obstructive pulmonary disease exacerbation.  4.  Severe hypomagnesemia.  5.  Hypocalcemia.  6.  Hypokalemia.  7.  Dementia of unknown etiology.  8.  Psychosis.  9.  Left ankle tenosynovitis, status post steroid injection.  10.  Hypertension.  11.  Chronic kidney disease.  12.  Anemia of chronic disease.  13.  Weakness.  14.  Leukocytosis, possibly related to steroids. No evidence of acute infection.   CONSULTANTS: Laurene Footman, MD with orthopedics. Surya K. Franchot Mimes, MD with psychiatry. Leotis Pain, MD of neurology.  CT scan of the head without contrast showed no acute pathology, other than atrophy. MRI of the brain, with and without contrast, showed mild punctate and patchy white matter changes consistent with small vessel disease, atrophy, mild component of hydrocephalus due to atrophy. Also mild sinusitis.   Chest x-ray, PA and lateral, showed posterior lung base opacities worrisome for pneumonia.   Admitting glucose 107, BUN 20, creatinine 1.81, sodium 140, potassium 3.3, chloride 103, calcium 6.4, magnesium was less than 0.3. LFTs showed albumin of 3.1. WBC 8.9, hemoglobin 9.6, platelet count was 201,000. Most recent magnesium on 08/31/2014 was 1.9. B12 751. RBC folate was 574. Transferrin 152. Vitamin D level 37.7.   HOSPITAL COURSE: Please refer to H and P done by the admitting physician. The patient is a 64 year old white male with a history of hypertension, hyperlipidemia, coronary artery disease, chronic obstructive pulmonary disease, dementia, presented to the hospital complaining of generalized weakness and left ankle pain with falls. The patient was found to be severely hypomagnesemic, hypokalemic, hypocalcemic, and was  admitted to the hospitalist service. These electrolytes were replaced. Currently electrolytes are stable. The patient also had left ankle pain that was felt to be due to tenosynovitis. The patient had a steroid injection and symptoms were much improved. Dementia with psychosis. The patient was seen by neurology and psychiatry. MRI of the brain showed some atrophy. The patient's mental status is significantly improved.   The patient was also noted to have bilateral basilar pneumonia, which was treated with antibiotics. The patient has been on steroids, however, his WBC count is elevated since starting the steroids; currently it is at 37,000, but he is afebrile. Therefore, this will need to be repeated. If persistently elevated next week, then he will need further evaluation with hematology as well as possible ID evaluation.    DISCHARGE MEDICATIONS: Wellbutrin SR 150 mg 1 tablet p.o. b.i.d., Lipitor 40 daily, Uroxatral 10 mg daily, Paxil CR 25 p.o. daily, Plavix 75 p.o. daily, Lipitor 20 daily 1 day and then alternate with 40 mg the other day, metoprolol tartrate 25 mg 1 tablet p.o. b.i.d., nitroglycerin 0.4 sublingual every 5 minutes as needed, alprazolam a half tablet daily, Seroquel 25 mg 2 tablets at bedtime, Protonix 40 daily, Levaquin 250 p.o. daily x 5 days, albuterol ipratropium every 4 hours as needed, guaifenesin 600 mg 1 tablet p.o. b.i.d., diclofenac 1% gel to affected area topically, iron sulfate 325 mg 1 tablet p.o. b.i.d., magnesium oxide 400 daily, calcium plus Vitamin D 1 tablet p.o. b.i.d., atenolol 650 mg q. 4 hours p.r.n. for pain,  Maalox 5 ml 4 times a day as needed for heartburn.   DIET: Low sodium.  OXYGEN: 2 L nasal cannula as needed.   ACTIVITY: As tolerated. PT evaluation and treatment.   FOLLOWUP: Follow up with psychiatry in 1-2 weeks. Follow up with neurology, Dr. >  in 2-4 weeks. CBC check on 09/10/2014.  TIME SPENT ON THIS DISCHARGE: 45 minutes.      ____________________________ Lafonda Mosses Posey Pronto, MD shp:MT D: 09/05/2014 11:52:00 ET T: 09/05/2014 12:23:17 ET JOB#: [id number]  cc: Bary Limbach H. Posey Pronto, MD, <Dictator> Alric Seton MD ELECTRONICALLY SIGNED 09/10/2014 12:20

## 2015-01-09 DIAGNOSIS — R2681 Unsteadiness on feet: Secondary | ICD-10-CM | POA: Diagnosis not present

## 2015-01-09 DIAGNOSIS — R2689 Other abnormalities of gait and mobility: Secondary | ICD-10-CM | POA: Diagnosis not present

## 2015-01-09 DIAGNOSIS — R262 Difficulty in walking, not elsewhere classified: Secondary | ICD-10-CM | POA: Diagnosis not present

## 2015-01-09 DIAGNOSIS — R251 Tremor, unspecified: Secondary | ICD-10-CM | POA: Diagnosis not present

## 2015-01-18 DIAGNOSIS — F411 Generalized anxiety disorder: Secondary | ICD-10-CM | POA: Diagnosis not present

## 2015-01-22 ENCOUNTER — Other Ambulatory Visit: Payer: Self-pay | Admitting: *Deleted

## 2015-01-22 MED ORDER — CLOPIDOGREL BISULFATE 75 MG PO TABS: ORAL_TABLET | ORAL | Status: DC

## 2015-02-06 ENCOUNTER — Other Ambulatory Visit: Payer: Self-pay

## 2015-02-06 NOTE — Telephone Encounter (Signed)
Patient's wife called concerned about upcoming dental work, patient needs anitbiotic prior to.  Thanks Please advise?

## 2015-02-07 MED ORDER — CLINDAMYCIN HCL 300 MG PO CAPS: ORAL_CAPSULE | ORAL | Status: DC

## 2015-02-07 NOTE — Telephone Encounter (Signed)
Ok to refill,  Refill sent  

## 2015-02-13 ENCOUNTER — Other Ambulatory Visit: Payer: Self-pay

## 2015-02-13 MED ORDER — CLOPIDOGREL BISULFATE 75 MG PO TABS: ORAL_TABLET | ORAL | Status: DC

## 2015-02-13 NOTE — Telephone Encounter (Signed)
Refill sent for [facility] plavix 75 mg take one tablet daily.

## 2015-03-13 ENCOUNTER — Other Ambulatory Visit: Payer: Self-pay | Admitting: *Deleted

## 2015-03-13 MED ORDER — CLOPIDOGREL BISULFATE 75 MG PO TABS: ORAL_TABLET | ORAL | Status: DC

## 2015-03-14 ENCOUNTER — Other Ambulatory Visit: Payer: Self-pay | Admitting: *Deleted

## 2015-03-14 DIAGNOSIS — R410 Disorientation, unspecified: Secondary | ICD-10-CM | POA: Diagnosis not present

## 2015-03-14 DIAGNOSIS — R2689 Other abnormalities of gait and mobility: Secondary | ICD-10-CM | POA: Diagnosis not present

## 2015-03-14 DIAGNOSIS — R251 Tremor, unspecified: Secondary | ICD-10-CM | POA: Diagnosis not present

## 2015-03-14 DIAGNOSIS — R413 Other amnesia: Secondary | ICD-10-CM | POA: Diagnosis not present

## 2015-03-14 DIAGNOSIS — R262 Difficulty in walking, not elsewhere classified: Secondary | ICD-10-CM | POA: Diagnosis not present

## 2015-03-14 MED ORDER — CLOPIDOGREL BISULFATE 75 MG PO TABS: ORAL_TABLET | ORAL | Status: DC

## 2015-03-17 DIAGNOSIS — I129 Hypertensive chronic kidney disease with stage 1 through stage 4 chronic kidney disease, or unspecified chronic kidney disease: Secondary | ICD-10-CM | POA: Diagnosis not present

## 2015-03-17 DIAGNOSIS — R251 Tremor, unspecified: Secondary | ICD-10-CM | POA: Diagnosis not present

## 2015-03-17 DIAGNOSIS — N189 Chronic kidney disease, unspecified: Secondary | ICD-10-CM | POA: Diagnosis not present

## 2015-03-17 DIAGNOSIS — I509 Heart failure, unspecified: Secondary | ICD-10-CM | POA: Diagnosis not present

## 2015-03-17 DIAGNOSIS — Z9181 History of falling: Secondary | ICD-10-CM | POA: Diagnosis not present

## 2015-03-17 DIAGNOSIS — Z96653 Presence of artificial knee joint, bilateral: Secondary | ICD-10-CM | POA: Diagnosis not present

## 2015-03-17 DIAGNOSIS — M6281 Muscle weakness (generalized): Secondary | ICD-10-CM | POA: Diagnosis not present

## 2015-03-21 DIAGNOSIS — M6281 Muscle weakness (generalized): Secondary | ICD-10-CM | POA: Diagnosis not present

## 2015-03-21 DIAGNOSIS — I509 Heart failure, unspecified: Secondary | ICD-10-CM | POA: Diagnosis not present

## 2015-03-21 DIAGNOSIS — N189 Chronic kidney disease, unspecified: Secondary | ICD-10-CM | POA: Diagnosis not present

## 2015-03-21 DIAGNOSIS — R251 Tremor, unspecified: Secondary | ICD-10-CM | POA: Diagnosis not present

## 2015-03-21 DIAGNOSIS — Z9181 History of falling: Secondary | ICD-10-CM | POA: Diagnosis not present

## 2015-03-21 DIAGNOSIS — I129 Hypertensive chronic kidney disease with stage 1 through stage 4 chronic kidney disease, or unspecified chronic kidney disease: Secondary | ICD-10-CM | POA: Diagnosis not present

## 2015-03-22 DIAGNOSIS — N189 Chronic kidney disease, unspecified: Secondary | ICD-10-CM | POA: Diagnosis not present

## 2015-03-22 DIAGNOSIS — M6281 Muscle weakness (generalized): Secondary | ICD-10-CM | POA: Diagnosis not present

## 2015-03-22 DIAGNOSIS — Z9181 History of falling: Secondary | ICD-10-CM | POA: Diagnosis not present

## 2015-03-22 DIAGNOSIS — I129 Hypertensive chronic kidney disease with stage 1 through stage 4 chronic kidney disease, or unspecified chronic kidney disease: Secondary | ICD-10-CM | POA: Diagnosis not present

## 2015-03-22 DIAGNOSIS — I509 Heart failure, unspecified: Secondary | ICD-10-CM | POA: Diagnosis not present

## 2015-03-22 DIAGNOSIS — R251 Tremor, unspecified: Secondary | ICD-10-CM | POA: Diagnosis not present

## 2015-03-23 ENCOUNTER — Other Ambulatory Visit: Payer: Self-pay | Admitting: Cardiovascular Disease

## 2015-03-26 DIAGNOSIS — N189 Chronic kidney disease, unspecified: Secondary | ICD-10-CM | POA: Diagnosis not present

## 2015-03-26 DIAGNOSIS — I509 Heart failure, unspecified: Secondary | ICD-10-CM | POA: Diagnosis not present

## 2015-03-26 DIAGNOSIS — R251 Tremor, unspecified: Secondary | ICD-10-CM | POA: Diagnosis not present

## 2015-03-26 DIAGNOSIS — Z9181 History of falling: Secondary | ICD-10-CM | POA: Diagnosis not present

## 2015-03-26 DIAGNOSIS — M6281 Muscle weakness (generalized): Secondary | ICD-10-CM | POA: Diagnosis not present

## 2015-03-26 DIAGNOSIS — I129 Hypertensive chronic kidney disease with stage 1 through stage 4 chronic kidney disease, or unspecified chronic kidney disease: Secondary | ICD-10-CM | POA: Diagnosis not present

## 2015-03-27 DIAGNOSIS — N183 Chronic kidney disease, stage 3 (moderate): Secondary | ICD-10-CM | POA: Diagnosis not present

## 2015-03-27 DIAGNOSIS — N390 Urinary tract infection, site not specified: Secondary | ICD-10-CM | POA: Diagnosis not present

## 2015-03-28 DIAGNOSIS — N189 Chronic kidney disease, unspecified: Secondary | ICD-10-CM | POA: Diagnosis not present

## 2015-03-28 DIAGNOSIS — M6281 Muscle weakness (generalized): Secondary | ICD-10-CM | POA: Diagnosis not present

## 2015-03-28 DIAGNOSIS — I129 Hypertensive chronic kidney disease with stage 1 through stage 4 chronic kidney disease, or unspecified chronic kidney disease: Secondary | ICD-10-CM | POA: Diagnosis not present

## 2015-03-28 DIAGNOSIS — R251 Tremor, unspecified: Secondary | ICD-10-CM | POA: Diagnosis not present

## 2015-03-28 DIAGNOSIS — Z9181 History of falling: Secondary | ICD-10-CM | POA: Diagnosis not present

## 2015-03-28 DIAGNOSIS — I509 Heart failure, unspecified: Secondary | ICD-10-CM | POA: Diagnosis not present

## 2015-03-29 DIAGNOSIS — I129 Hypertensive chronic kidney disease with stage 1 through stage 4 chronic kidney disease, or unspecified chronic kidney disease: Secondary | ICD-10-CM | POA: Diagnosis not present

## 2015-03-29 DIAGNOSIS — R251 Tremor, unspecified: Secondary | ICD-10-CM | POA: Diagnosis not present

## 2015-03-29 DIAGNOSIS — I509 Heart failure, unspecified: Secondary | ICD-10-CM | POA: Diagnosis not present

## 2015-03-29 DIAGNOSIS — N189 Chronic kidney disease, unspecified: Secondary | ICD-10-CM | POA: Diagnosis not present

## 2015-03-29 DIAGNOSIS — M6281 Muscle weakness (generalized): Secondary | ICD-10-CM | POA: Diagnosis not present

## 2015-03-29 DIAGNOSIS — Z9181 History of falling: Secondary | ICD-10-CM | POA: Diagnosis not present

## 2015-04-02 DIAGNOSIS — M6281 Muscle weakness (generalized): Secondary | ICD-10-CM | POA: Diagnosis not present

## 2015-04-02 DIAGNOSIS — I129 Hypertensive chronic kidney disease with stage 1 through stage 4 chronic kidney disease, or unspecified chronic kidney disease: Secondary | ICD-10-CM | POA: Diagnosis not present

## 2015-04-02 DIAGNOSIS — Z9181 History of falling: Secondary | ICD-10-CM | POA: Diagnosis not present

## 2015-04-02 DIAGNOSIS — I509 Heart failure, unspecified: Secondary | ICD-10-CM | POA: Diagnosis not present

## 2015-04-02 DIAGNOSIS — R251 Tremor, unspecified: Secondary | ICD-10-CM | POA: Diagnosis not present

## 2015-04-02 DIAGNOSIS — N189 Chronic kidney disease, unspecified: Secondary | ICD-10-CM | POA: Diagnosis not present

## 2015-04-05 ENCOUNTER — Ambulatory Visit: Payer: Medicare Other | Admitting: Psychiatry

## 2015-04-06 DIAGNOSIS — R251 Tremor, unspecified: Secondary | ICD-10-CM | POA: Diagnosis not present

## 2015-04-06 DIAGNOSIS — Z9181 History of falling: Secondary | ICD-10-CM | POA: Diagnosis not present

## 2015-04-06 DIAGNOSIS — N189 Chronic kidney disease, unspecified: Secondary | ICD-10-CM | POA: Diagnosis not present

## 2015-04-06 DIAGNOSIS — M6281 Muscle weakness (generalized): Secondary | ICD-10-CM | POA: Diagnosis not present

## 2015-04-06 DIAGNOSIS — I509 Heart failure, unspecified: Secondary | ICD-10-CM | POA: Diagnosis not present

## 2015-04-06 DIAGNOSIS — I129 Hypertensive chronic kidney disease with stage 1 through stage 4 chronic kidney disease, or unspecified chronic kidney disease: Secondary | ICD-10-CM | POA: Diagnosis not present

## 2015-04-10 DIAGNOSIS — I129 Hypertensive chronic kidney disease with stage 1 through stage 4 chronic kidney disease, or unspecified chronic kidney disease: Secondary | ICD-10-CM | POA: Diagnosis not present

## 2015-04-10 DIAGNOSIS — Z9181 History of falling: Secondary | ICD-10-CM | POA: Diagnosis not present

## 2015-04-10 DIAGNOSIS — I509 Heart failure, unspecified: Secondary | ICD-10-CM | POA: Diagnosis not present

## 2015-04-10 DIAGNOSIS — R251 Tremor, unspecified: Secondary | ICD-10-CM | POA: Diagnosis not present

## 2015-04-10 DIAGNOSIS — M6281 Muscle weakness (generalized): Secondary | ICD-10-CM | POA: Diagnosis not present

## 2015-04-10 DIAGNOSIS — N189 Chronic kidney disease, unspecified: Secondary | ICD-10-CM | POA: Diagnosis not present

## 2015-04-12 DIAGNOSIS — I129 Hypertensive chronic kidney disease with stage 1 through stage 4 chronic kidney disease, or unspecified chronic kidney disease: Secondary | ICD-10-CM | POA: Diagnosis not present

## 2015-04-12 DIAGNOSIS — N189 Chronic kidney disease, unspecified: Secondary | ICD-10-CM | POA: Diagnosis not present

## 2015-04-12 DIAGNOSIS — Z9181 History of falling: Secondary | ICD-10-CM | POA: Diagnosis not present

## 2015-04-12 DIAGNOSIS — R251 Tremor, unspecified: Secondary | ICD-10-CM | POA: Diagnosis not present

## 2015-04-12 DIAGNOSIS — M6281 Muscle weakness (generalized): Secondary | ICD-10-CM | POA: Diagnosis not present

## 2015-04-12 DIAGNOSIS — I509 Heart failure, unspecified: Secondary | ICD-10-CM | POA: Diagnosis not present

## 2015-04-17 DIAGNOSIS — R251 Tremor, unspecified: Secondary | ICD-10-CM | POA: Diagnosis not present

## 2015-04-17 DIAGNOSIS — M6281 Muscle weakness (generalized): Secondary | ICD-10-CM | POA: Diagnosis not present

## 2015-04-17 DIAGNOSIS — Z9181 History of falling: Secondary | ICD-10-CM | POA: Diagnosis not present

## 2015-04-17 DIAGNOSIS — I129 Hypertensive chronic kidney disease with stage 1 through stage 4 chronic kidney disease, or unspecified chronic kidney disease: Secondary | ICD-10-CM | POA: Diagnosis not present

## 2015-04-17 DIAGNOSIS — N189 Chronic kidney disease, unspecified: Secondary | ICD-10-CM | POA: Diagnosis not present

## 2015-04-17 DIAGNOSIS — I509 Heart failure, unspecified: Secondary | ICD-10-CM | POA: Diagnosis not present

## 2015-04-19 ENCOUNTER — Ambulatory Visit (INDEPENDENT_AMBULATORY_CARE_PROVIDER_SITE_OTHER): Payer: Medicare Other | Admitting: Psychiatry

## 2015-04-19 DIAGNOSIS — F429 Obsessive-compulsive disorder, unspecified: Secondary | ICD-10-CM

## 2015-04-19 DIAGNOSIS — F42 Obsessive-compulsive disorder: Secondary | ICD-10-CM

## 2015-04-19 DIAGNOSIS — I2510 Atherosclerotic heart disease of native coronary artery without angina pectoris: Secondary | ICD-10-CM

## 2015-04-19 MED ORDER — BUPROPION HCL ER (SR) 150 MG PO TB12: 150.0000 mg | ORAL_TABLET | Freq: Two times a day (BID) | ORAL | Status: DC

## 2015-04-19 MED ORDER — PAROXETINE HCL ER 25 MG PO TB24: 25.0000 mg | ORAL_TABLET | ORAL | Status: DC

## 2015-04-19 MED ORDER — QUETIAPINE FUMARATE 25 MG PO TABS: 50.0000 mg | ORAL_TABLET | Freq: Every day | ORAL | Status: DC

## 2015-04-19 NOTE — Progress Notes (Signed)
[facility] MD Progress Note  04/19/2015 7:48 PM Edwin Woodward  MRN:  [id number] Subjective:  Follow-up for this patient with OCD and recurrent depression and possible bipolar disorder. Principal Problem: @PPROB @OCD  also bipolar disorder Diagnosis:   Patient Active Problem List   Diagnosis Date Noted  . Gout of foot [M10.079] 11/26/2014  . Generalized weakness [R53.1] 08/26/2014  . Hypomagnesemia [E83.42] 08/26/2014  . Intention tremor [G25.2] 06/07/2014  . Cognitive complaints with normal exam [R41.9] 06/07/2014  . Left sided sciatica [M54.32] 06/07/2014  . Musculoskeletal neck pain [M54.2] 01/19/2014  . Allergic rhinitis [J30.9] 12/04/2013  . Thalassemia [D56.9] 08/16/2011  . Tendonitis of wrist, right [M77.8] 08/16/2011  . GAD (generalized anxiety disorder) [F41.1] 05/14/2011  . Obesity [E66.9] 04/11/2011  . OSA on CPAP [G47.33] 04/11/2011  . Hyperlipidemia [E78.5] 11/22/2009  . HYPERTENSION, BENIGN [I10] 11/22/2009  . CAD, NATIVE VESSEL [I25.10] 11/22/2009   Total Time spent with patient: 30 minutes   Past Medical History:  Past Medical History  Diagnosis Date  . CAD (coronary artery disease)   . Presence of bare metal stent in right coronary artery     proximal RCA adn LAD in April 2009  . Hyperlipidemia   . Hypertension   . Cardiovascular stress test abnormal     Jan 2010 no significant ischemia  . Trigger finger     Past Surgical History  Procedure Laterality Date  . Replacement total knee    . Coronary artery bypass graft      2 vessel  . Joint replacement      total knee  . Appendectomy    . Carpal tunnel release    . Vasectomy    . Coronary angioplasty  12/2007  . Appendectomy    . Tonsillectomy    . Cataract extraction      right   Family History:  Family History  Problem Relation Age of Onset  . Hypertension Mother   . Diabetes Mother   . Thalassemia Mother   . Heart disease Father    Social History:  History  Alcohol Use No     History   Drug Use  . Yes  . Special: Marijuana    Social History   Social History  . Marital Status: Married    Spouse Name: N/A  . Number of Children: N/A  . Years of Education: N/A   Social History Main Topics  . Smoking status: Former Research scientist (life sciences)  . Smokeless tobacco: Never Used  . Alcohol Use: No  . Drug Use: Yes    Special: Marijuana  . Sexual Activity: Not on file   Other Topics Concern  . Not on file   Social History Narrative   Lives with wife.   Additional History:    Sleep: Fair  Appetite:  Fair   Assessment:  Since last visit patient has continued to have more symptoms of dementia. He is going to see 8 memory specialist at [facility] coming up in a month or so. He continues to stay off of marijuana. He has moved back into his own home although his ex-wife continues to keep an eye on him. His mood has been irritable at times and he has a lot of forgetfulness but he is getting more emotionally stable.Musculoskeletal: Strength & Muscle Tone: within normal limits Gait & Station: normal Patient leans: N/A   Psychiatric Specialty Exam: Physical Exam  ROS  There were no vitals taken for this visit.There is no weight on file to calculate BMI.  General  Appearance: Casual  Eye Contact::  Good  Speech:  Clear and Coherent  Volume:  Normal  Mood:  Anxious  Affect:  Full Range  Thought Process:  Circumstantial and Goal Directed  Orientation:  Full (Time, Place, and Person)  Thought Content:  Negative  Suicidal Thoughts:  No  Homicidal Thoughts:  No  Memory:  Immediate;   Good Recent;   Fair Remote;   Fair  Judgement:  Intact  Insight:  Fair  Psychomotor Activity:  Normal  Concentration:  Fair  Recall:  [employer] of Knowledge:Good  Language: Good  Akathisia:  No  Handed:  Right  AIMS (if indicated):     Assets:  Communication Skills Desire for Improvement Financial Resources/Insurance Housing Intimacy Social Support  ADL's:  Intact  Cognition: Impaired,  Mild   Sleep:        Current Medications: Current Outpatient Prescriptions  Medication Sig Dispense Refill  . alfuzosin (UROXATRAL) 10 MG 24 hr tablet Take 10 mg by mouth daily.      Marland Kitchen ALPRAZolam (XANAX XR) 0.5 MG 24 hr tablet Take 1 tablet (0.5 mg total) by mouth daily. (Patient not taking: Reported on 04/19/2015) 30 tablet 1  . atorvastatin (LIPITOR) 40 MG tablet Take 40 mg alt with 20 mg daily.    Marland Kitchen buPROPion (WELLBUTRIN SR) 150 MG 12 hr tablet Take 1 tablet (150 mg total) by mouth 2 (two) times daily. Take one tablet in the am & one tablet @@ 2 pm daily. 60 tablet 6  . calcium carbonate 1250 MG capsule Take 1,250 mg by mouth 2 (two) times daily with a meal.    . clindamycin (CLEOCIN) 300 MG capsule 2 CAPSULES ONE HOUR BEFORE DENTAL PROCEDURE 2 capsule 0  . clopidogrel (PLAVIX) 75 MG tablet TAKE 1 TABLET (75 MG TOTAL) BY MOUTH DAILY. 90 tablet 3  . colchicine (COLCRYS) 0.6 MG tablet Take 1 tablet (0.6 mg total) by mouth daily. 30 tablet 0  . diclofenac sodium (VOLTAREN) 1 % GEL Apply 1 application topically as needed.    Marland Kitchen LIPITOR 40 MG tablet TAKE 1 TABLET (40 MG TOTAL) BY MOUTH DAILY. 90 tablet 3  . magnesium gluconate (MAGONATE) 500 MG tablet Take 500 mg by mouth 2 (two) times daily.    . Magnesium Oxide 400 MG CAPS Take 1 capsule (400 mg total) by mouth 2 (two) times daily after a meal. 180 capsule 1  . metoprolol tartrate (LOPRESSOR) 25 MG tablet TAKE 1 TABLET (25 MG TOTAL) BY MOUTH 2 (TWO) TIMES DAILY. 180 tablet 1  . nitroGLYCERIN (NITROSTAT) 0.4 MG SL tablet Place 1 tablet (0.4 mg total) under the tongue every 5 (five) minutes as needed. 90 tablet 3  . pantoprazole (PROTONIX) 40 MG tablet TAKE 1 TABLET (40 MG TOTAL) BY MOUTH DAILY. 90 tablet 1  . PARoxetine (PAXIL-CR) 25 MG 24 hr tablet Take 1 tablet (25 mg total) by mouth every morning. 30 tablet 6  . QUEtiapine (SEROQUEL) 25 MG tablet Take 2 tablets (50 mg total) by mouth at bedtime. 60 tablet 6   No current facility-administered  medications for this visit.    Lab Results: No results found for this or any previous visit (from the past 48 hour(s)).  Physical Findings: AIMS:  , ,  ,  ,    CIWA:    COWS:     Treatment Plan Summary: Medication management and Plan patient is no longer taking benzodiazepine's which is helpful to his memory. He is also staying off  of marijuana which I applaud. Continue the current doses of Wellbutrin and Paxil and Seroquel. Supportive counseling done. No other change to medicine. Follow-up with him in another 6 months or as needed. Made some suggestions about ways to better manage his routine at home.   Medical Decision Making:  Established Problem, Stable/Improving (1), Review of Psycho-Social Stressors (1), Review of Medication Regimen & Side Effects (2) and Review of New Medication or Change in Dosage (2)     John Clapacs 04/19/2015, 7:48 PM

## 2015-04-20 DIAGNOSIS — Z9181 History of falling: Secondary | ICD-10-CM | POA: Diagnosis not present

## 2015-04-20 DIAGNOSIS — I129 Hypertensive chronic kidney disease with stage 1 through stage 4 chronic kidney disease, or unspecified chronic kidney disease: Secondary | ICD-10-CM | POA: Diagnosis not present

## 2015-04-20 DIAGNOSIS — M6281 Muscle weakness (generalized): Secondary | ICD-10-CM | POA: Diagnosis not present

## 2015-04-20 DIAGNOSIS — R251 Tremor, unspecified: Secondary | ICD-10-CM | POA: Diagnosis not present

## 2015-04-20 DIAGNOSIS — N189 Chronic kidney disease, unspecified: Secondary | ICD-10-CM | POA: Diagnosis not present

## 2015-04-20 DIAGNOSIS — I509 Heart failure, unspecified: Secondary | ICD-10-CM | POA: Diagnosis not present

## 2015-05-07 DIAGNOSIS — R251 Tremor, unspecified: Secondary | ICD-10-CM | POA: Diagnosis not present

## 2015-05-07 DIAGNOSIS — R2689 Other abnormalities of gait and mobility: Secondary | ICD-10-CM | POA: Diagnosis not present

## 2015-05-07 DIAGNOSIS — R262 Difficulty in walking, not elsewhere classified: Secondary | ICD-10-CM | POA: Diagnosis not present

## 2015-05-07 DIAGNOSIS — R410 Disorientation, unspecified: Secondary | ICD-10-CM | POA: Diagnosis not present

## 2015-05-29 ENCOUNTER — Ambulatory Visit: Payer: Medicare Other | Admitting: Speech Pathology

## 2015-06-05 ENCOUNTER — Encounter: Payer: Medicare Other | Admitting: Speech Pathology

## 2015-06-07 ENCOUNTER — Encounter: Payer: Medicare Other | Admitting: Speech Pathology

## 2015-06-12 ENCOUNTER — Ambulatory Visit: Payer: Medicare Other | Attending: Neurology | Admitting: Speech Pathology

## 2015-06-12 DIAGNOSIS — R41841 Cognitive communication deficit: Secondary | ICD-10-CM | POA: Diagnosis not present

## 2015-06-13 ENCOUNTER — Encounter: Payer: Self-pay | Admitting: Speech Pathology

## 2015-06-13 NOTE — Therapy (Signed)
[place] MAIN [facility] SERVICES [address] [place], [place], [address] Phone: [phone]   Fax:  [phone]  Speech Language Pathology Evaluation  Patient Details  Name: Edwin Woodward MRN: [id number] Date of Birth: 1951/05/25 Referring Provider:  Anabel Bene, MD  Encounter Date: 06/12/2015      End of Session - 06/13/15 1635    Visit Number 1   Number of Visits 17   Date for SLP Re-Evaluation 08/12/15   SLP Start Time 1302   SLP Stop Time  1400   SLP Time Calculation (min) 58 min   Activity Tolerance Patient tolerated treatment well      Past Medical History  Diagnosis Date  . CAD (coronary artery disease)   . Presence of bare metal stent in right coronary artery     proximal RCA adn LAD in April 2009  . Hyperlipidemia   . Hypertension   . Cardiovascular stress test abnormal     Jan 2010 no significant ischemia  . Trigger finger     Past Surgical History  Procedure Laterality Date  . Replacement total knee    . Coronary artery bypass graft      2 vessel  . Joint replacement      total knee  . Appendectomy    . Carpal tunnel release    . Vasectomy    . Coronary angioplasty  12/2007  . Appendectomy    . Tonsillectomy    . Cataract extraction      right    There were no vitals filed for this visit.  Visit Diagnosis: Cognitive communication deficit - Plan: SLP plan of care cert/re-cert          SLP Evaluation [facility] - 06/13/15 0001    SLP Visit Information   SLP Received On 06/12/15   Onset Date 05/07/2015   Medical Diagnosis Disorientation, unspecified   Subjective   Patient/Family Stated Goal Maximize cognitive skills and independence   Prior Functional Status   Cognitive/Linguistic Baseline Information not available  Baseline is not clear   Cognition   Overall Cognitive Status Impaired/Different from baseline   Area of Impairment Attention;Memory;Problem solving   Oral Motor/Sensory Function    Overall Oral Motor/Sensory Function Appears within functional limits for tasks assessed   Motor Speech   Overall Motor Speech Appears within functional limits for tasks assessed   Standardized Assessments   Standardized Assessments  Cognitive Linguistic Quick Test  Portions completed       Patient/family interview: the patient has undergone a significant change in behavior ("things he used to like, he doesn't like any more").  The patient is non-compliant for some recommended devices (eg. hearing aids).  He is not able and/or willing to participate in simple cognitive retraining activities when presented by his wife.  Portions of the Cognitive Linguist Quick Test   Personal Facts: 8/8 given extra processing time for recall   Symbol Cancellation: 0/12  apparent left inattention and failure to retain "rules" of task    Clock Drawing:  9/13  Moderate severity         SLP Education - 06/13/15 1634    Education provided Yes   Education Details Value and limitations of speech therapy for reported problems   Person(s) Educated Patient;Spouse   Methods Explanation   Comprehension Verbalized understanding            SLP Long Term Goals - 06/13/15 1638    SLP LONG TERM GOAL #1  Title Patient will demonstrate functional cognitive-communication skills within supportive environment.   Time 8   Period Weeks   Status New   SLP LONG TERM GOAL #2   Title Patient will complete simple attention tasks with 80% accuracy.   Time 8   Period Weeks   Status New   SLP LONG TERM GOAL #3   Title Patient will complete simple visual-spatial activities with 80% accuracy.   Time 8   Period Weeks   Status New   SLP LONG TERM GOAL #4   Title Patient will complete simple executive function skills tasks with 80% accuracy.   Time 8   Period Weeks   Status New          Plan - 06-30-2015 1636    Clinical Impression Statement At 10 months post rapid onset of cognitive impairment, the  patient is presenting with severe cognitive-linguistic deficits characterized by deficits in attention, visuospatial skills, and executive skills (including self-awareness, planning, organizing, problem solving).  Formal testing (Cognitive Linguistic Quick Test) has not been completed secondary time constraints.  The patient will benefit from skilled speech therapy for ongoing evaluation of needs, as well as restorative and compensatory treatment of cognitive communication deficits.  Continued testing will provide a better cognitive linguistic baseline to plan a better cognitive stimulation program as well as plan function compensatory strategies to maximize the patient's independence.  Referral to neuropsychology may be helpful in differential diagnosis of the patient's cognitive impairments.     Speech Therapy Frequency 2x / week   Duration Other (comment)  8 weeks   Treatment/Interventions Cognitive reorganization;Internal/external aids;Functional tasks;SLP instruction and feedback;Patient/family education   Potential to Achieve Goals Fair   Potential Considerations Ability to learn/carryover information;Co-morbidities;Cooperation/participation level;Medical prognosis;Previous level of function;Severity of impairments;Family/community support   SLP Home Exercise Plan To be determined   Consulted and Agree with Plan of Care Patient;Family member/caregiver   Family Member Consulted Spouse          G-Codes - 06/30/15 1641    Functional Assessment Tool Used Cognitive linguistic evaluation   Functional Limitations Attention   Attention Current Status [phone]) At least 80 percent but less than 100 percent impaired, limited or restricted   Attention Goal Status (W5462) At least 20 percent but less than 40 percent impaired, limited or restricted      Problem List Patient Active Problem List   Diagnosis Date Noted  . Gout of foot 11/26/2014  . Generalized weakness 08/26/2014  . Hypomagnesemia  08/26/2014  . Intention tremor 06/07/2014  . Cognitive complaints with normal exam 06/07/2014  . Left sided sciatica 06/07/2014  . Musculoskeletal neck pain 01/19/2014  . Allergic rhinitis 12/04/2013  . Thalassemia 08/16/2011  . Tendonitis of wrist, right 08/16/2011  . GAD (generalized anxiety disorder) 05/14/2011  . Obesity 04/11/2011  . OSA on CPAP 04/11/2011  . Hyperlipidemia 11/22/2009  . HYPERTENSION, BENIGN 11/22/2009  . CAD, NATIVE VESSEL 11/22/2009   Leroy Sea, MS/CCC- SLP  Lou Miner Jun 30, 2015, 4:45 PM  [place] MAIN [facility] SERVICES [address] [place], [place], [address] Phone: [phone]   Fax:  [phone]

## 2015-06-18 ENCOUNTER — Encounter: Payer: Medicare Other | Admitting: Speech Pathology

## 2015-06-18 ENCOUNTER — Ambulatory Visit: Payer: Medicare Other | Admitting: Speech Pathology

## 2015-06-18 DIAGNOSIS — R41841 Cognitive communication deficit: Secondary | ICD-10-CM

## 2015-06-19 ENCOUNTER — Encounter: Payer: Self-pay | Admitting: Speech Pathology

## 2015-06-19 NOTE — Therapy (Signed)
[place] MAIN [facility] SERVICES [address] [place], [place], [address] Phone: [phone]   Fax:  [phone]  Speech Language Pathology Treatment  Patient Details  Name: Edwin Woodward MRN: [id number] Date of Birth: 1951-04-24 Referring Provider:  Anabel Bene, MD  Encounter Date: 06/18/2015      End of Session - 06/19/15 1134    Visit Number 2   Number of Visits 17   Date for SLP Re-Evaluation 08/12/15   SLP Start Time 1500   SLP Stop Time  1555   SLP Time Calculation (min) 55 min   Activity Tolerance Patient tolerated treatment well      Past Medical History  Diagnosis Date  . CAD (coronary artery disease)   . Presence of bare metal stent in right coronary artery     proximal RCA adn LAD in April 2009  . Hyperlipidemia   . Hypertension   . Cardiovascular stress test abnormal     Jan 2010 no significant ischemia  . Trigger finger     Past Surgical History  Procedure Laterality Date  . Replacement total knee    . Coronary artery bypass graft      2 vessel  . Joint replacement      total knee  . Appendectomy    . Carpal tunnel release    . Vasectomy    . Coronary angioplasty  12/2007  . Appendectomy    . Tonsillectomy    . Cataract extraction      right    There were no vitals filed for this visit.  Visit Diagnosis: Cognitive communication deficit      Subjective Assessment - 06/19/15 1132    Subjective The patient is not able to clearly state his cognitive deficits and impact on his daily living or independence.     Currently in Pain? No/denies               ADULT SLP TREATMENT - 06/19/15 0001    General Information   Behavior/Cognition Alert;Cooperative;Pleasant mood;Confused;Impulsive;Distractible;Decreased sustained attention   HPI "Confusion"   Treatment Provided   Treatment provided Cognitive-Linquistic   Pain Assessment   Pain Assessment No/denies pain   Cognitive-Linquistic  Treatment   Treatment focused on Cognition   Skilled Treatment The results of the Cognitive Linguistic Quick Test (CLQT) indicate a composite severity rating of moderate.  The patient demonstrates severe deficits in the domains of attention, executive function, and visuospatial skills.  He demonstrates moderate deficits in memory and clock drawing.  The patient demonstrated mild function in the domain of language.  The patient demonstrates relative strength in language function.    In addition to completing the CLQT, the reading and writing portions of the Western Aphasia Battery were completed.  READING: 98/100.  Patient is able to read/comprehend lengthy sentences.  WRITING: 90/100.  Patient able to write meaningful and cogent sentences.   Assessment / Recommendations / Plan   Plan Continue with current plan of care   Progression Toward Goals   Progression toward goals Progressing toward goals          SLP Education - 06/19/15 1133    Education provided Yes   Education Details Begin a log of difficulties faced due to cognitive deficits   Person(s) Educated Patient   Methods Explanation;Handout   Comprehension Verbalized understanding;Need further instruction            SLP Long Term Goals - 06/13/15 1638    SLP LONG  TERM GOAL #1   Title Patient will demonstrate functional cognitive-communication skills within supportive environment.   Time 8   Period Weeks   Status New   SLP LONG TERM GOAL #2   Title Patient will complete simple attention tasks with 80% accuracy.   Time 8   Period Weeks   Status New   SLP LONG TERM GOAL #3   Title Patient will complete simple visual-spatial activities with 80% accuracy.   Time 8   Period Weeks   Status New   SLP LONG TERM GOAL #4   Title Patient will complete simple executive function skills tasks with 80% accuracy.   Time 8   Period Weeks   Status New          Plan - 06/19/15 1135    Clinical Impression Statement The patient is  demonstrating significant cognitive deficits per cognitive-linguistic testing.  He demonstrates relative strengths in language skills, providing an avenue to improve functional independence and cognitive stimulation activities.      Speech Therapy Frequency 2x / week   Duration Other (comment)   Treatment/Interventions Cognitive reorganization;Internal/external aids;Functional tasks;SLP instruction and feedback;Patient/family education   Potential to Achieve Goals Fair   Potential Considerations Ability to learn/carryover information;Co-morbidities;Cooperation/participation level;Medical prognosis;Previous level of function;Severity of impairments;Family/community support   SLP Home Exercise Plan Sent home planner with instructions to begin a list of cognitive barriers   Consulted and Agree with Plan of Care Patient        Problem List Patient Active Problem List   Diagnosis Date Noted  . Gout of foot 11/26/2014  . Generalized weakness 08/26/2014  . Hypomagnesemia 08/26/2014  . Intention tremor 06/07/2014  . Cognitive complaints with normal exam 06/07/2014  . Left sided sciatica 06/07/2014  . Musculoskeletal neck pain 01/19/2014  . Allergic rhinitis 12/04/2013  . Thalassemia 08/16/2011  . Tendonitis of wrist, right 08/16/2011  . GAD (generalized anxiety disorder) 05/14/2011  . Obesity 04/11/2011  . OSA on CPAP 04/11/2011  . Hyperlipidemia 11/22/2009  . HYPERTENSION, BENIGN 11/22/2009  . CAD, NATIVE VESSEL 11/22/2009   Leroy Sea, MS/CCC- SLP   Lou Miner 06/19/2015, 11:36 AM  [place] MAIN [facility] SERVICES [address] [place], [place], [address] Phone: [phone]   Fax:  [phone]

## 2015-06-20 ENCOUNTER — Encounter: Payer: Medicare Other | Admitting: Speech Pathology

## 2015-06-21 ENCOUNTER — Ambulatory Visit: Payer: Medicare Other | Admitting: Speech Pathology

## 2015-06-21 DIAGNOSIS — R41841 Cognitive communication deficit: Secondary | ICD-10-CM | POA: Diagnosis not present

## 2015-06-22 ENCOUNTER — Encounter: Payer: Self-pay | Admitting: Speech Pathology

## 2015-06-22 ENCOUNTER — Other Ambulatory Visit: Payer: Self-pay | Admitting: Internal Medicine

## 2015-06-22 NOTE — Therapy (Signed)
[place] MAIN [facility] SERVICES [address] [place], [place], [address] Phone: [phone]   Fax:  [phone]  Speech Language Pathology Treatment  Patient Details  Name: Edwin Woodward MRN: [id number] Date of Birth: [birth year] Referring Provider: Anabel Bene., MD  Encounter Date: 06/21/2015      End of Session - 06/22/15 1114    Visit Number 3   Number of Visits 17   Date for SLP Re-Evaluation 08/12/15   SLP Start Time 1100   SLP Stop Time  1155   SLP Time Calculation (min) 55 min   Activity Tolerance Patient tolerated treatment well      Past Medical History  Diagnosis Date  . CAD (coronary artery disease)   . Presence of bare metal stent in right coronary artery     proximal RCA adn LAD in April 2009  . Hyperlipidemia   . Hypertension   . Cardiovascular stress test abnormal     Jan 2010 no significant ischemia  . Trigger finger     Past Surgical History  Procedure Laterality Date  . Replacement total knee    . Coronary artery bypass graft      2 vessel  . Joint replacement      total knee  . Appendectomy    . Carpal tunnel release    . Vasectomy    . Coronary angioplasty  12/2007  . Appendectomy    . Tonsillectomy    . Cataract extraction      right    There were no vitals filed for this visit.  Visit Diagnosis: Cognitive communication deficit      Subjective Assessment - 06/22/15 1113    Subjective "My hand just gives out on me" while completing maze activity.   Currently in Pain? No/denies           SLP Evaluation [facility] - 06/22/15 0001    SLP Visit Information   Referring Provider Anabel Bene., MD            ADULT SLP TREATMENT - 06/22/15 0001    General Information   Behavior/Cognition Alert;Cooperative;Pleasant mood;Confused;Impulsive;Distractible;Decreased sustained attention   HPI "Confusion"   Treatment Provided   Treatment provided Cognitive-Linquistic   Pain  Assessment   Pain Assessment No/denies pain   Cognitive-Linquistic Treatment   Treatment focused on Cognition   Skilled Treatment VISUOSPATIAL: Copy simple line designs with 50% accuracy.  Errors are primarily due to impulsive response coupled with poor self-monitoring for accuracy.  Mazes: trace simple mazes with 80% accuracy given reminders to go slowly.  READING COMPREHENSION: select word to insert into blank in single sentence with 80% accuracy given reminders to be thorough.  Errors are again secondary impulsive response.   Assessment / Recommendations / Plan   Plan Continue with current plan of care   Progression Toward Goals   Progression toward goals Progressing toward goals          SLP Education - 06/22/15 1114    Education provided Yes   Education Details Need to curb impulsive responses   Person(s) Educated Patient   Methods Explanation   Comprehension Verbalized understanding;Returned demonstration;Need further instruction            SLP Long Term Goals - 06/13/15 1638    SLP LONG TERM GOAL #1   Title Patient will demonstrate functional cognitive-communication skills within supportive environment.   Time 8   Period Weeks   Status New   SLP LONG TERM  GOAL #2   Title Patient will complete simple attention tasks with 80% accuracy.   Time 8   Period Weeks   Status New   SLP LONG TERM GOAL #3   Title Patient will complete simple visual-spatial activities with 80% accuracy.   Time 8   Period Weeks   Status New   SLP LONG TERM GOAL #4   Title Patient will complete simple executive function skills tasks with 80% accuracy.   Time 8   Period Weeks   Status New          Plan - 06/22/15 1115    Clinical Impression Statement The patient is staying on task and completing simple cognitive-linguistic tasks with minimal cues/support from SLP.  He is demonstrating impulsive behaviors but is more readily acknowledging the negative impact that has on accuracy.     Speech Therapy Frequency 2x / week   Duration Other (comment)   Treatment/Interventions Cognitive reorganization;Internal/external aids;Functional tasks;SLP instruction and feedback;Patient/family education   Potential to Achieve Goals Fair   Potential Considerations Ability to learn/carryover information;Co-morbidities;Cooperation/participation level;Medical prognosis;Previous level of function;Severity of impairments;Family/community support   SLP Home Exercise Plan Sent home planner with instructions to begin a list of cognitive barriers   Consulted and Agree with Plan of Care Patient        Problem List Patient Active Problem List   Diagnosis Date Noted  . Gout of foot 11/26/2014  . Generalized weakness 08/26/2014  . Hypomagnesemia 08/26/2014  . Intention tremor 06/07/2014  . Cognitive complaints with normal exam 06/07/2014  . Left sided sciatica 06/07/2014  . Musculoskeletal neck pain 01/19/2014  . Allergic rhinitis 12/04/2013  . Thalassemia 08/16/2011  . Tendonitis of wrist, right 08/16/2011  . GAD (generalized anxiety disorder) 05/14/2011  . Obesity 04/11/2011  . OSA on CPAP 04/11/2011  . Hyperlipidemia 11/22/2009  . HYPERTENSION, BENIGN 11/22/2009  . CAD, NATIVE VESSEL 11/22/2009   Leroy Sea, MS/CCC- SLP  Lou Miner 06/22/2015, 11:16 AM  [place] MAIN [facility] SERVICES [address] [place], [place], [address] Phone: [phone]   Fax:  [phone]   Name: Edwin Woodward MRN: [id number] Date of Birth: 05/22/1951

## 2015-06-25 ENCOUNTER — Encounter: Payer: Medicare Other | Admitting: Speech Pathology

## 2015-06-26 ENCOUNTER — Ambulatory Visit: Payer: Medicare Other | Admitting: Speech Pathology

## 2015-06-26 DIAGNOSIS — R41841 Cognitive communication deficit: Secondary | ICD-10-CM

## 2015-06-27 ENCOUNTER — Encounter: Payer: Self-pay | Admitting: Speech Pathology

## 2015-06-27 ENCOUNTER — Encounter: Payer: Medicare Other | Admitting: Speech Pathology

## 2015-06-27 NOTE — Therapy (Signed)
[place] MAIN [facility] SERVICES [address] [place], [place], [address] Phone: [phone]   Fax:  [phone]  Speech Language Pathology Treatment  Patient Details  Name: Edwin Woodward MRN: [id number] Date of Birth: Dec 19, 1950 Referring Provider: Anabel Bene., MD  Encounter Date: 06/26/2015      End of Session - 06/27/15 1045    Visit Number 4   Number of Visits 17   Date for SLP Re-Evaluation 08/12/15   SLP Start Time 1500   SLP Stop Time  1555   SLP Time Calculation (min) 55 min   Activity Tolerance Patient tolerated treatment well      Past Medical History  Diagnosis Date  . CAD (coronary artery disease)   . Presence of bare metal stent in right coronary artery     proximal RCA adn LAD in April 2009  . Hyperlipidemia   . Hypertension   . Cardiovascular stress test abnormal     Jan 2010 no significant ischemia  . Trigger finger     Past Surgical History  Procedure Laterality Date  . Replacement total knee    . Coronary artery bypass graft      2 vessel  . Joint replacement      total knee  . Appendectomy    . Carpal tunnel release    . Vasectomy    . Coronary angioplasty  12/2007  . Appendectomy    . Tonsillectomy    . Cataract extraction      right    There were no vitals filed for this visit.  Visit Diagnosis: Cognitive communication deficit      Subjective Assessment - 06/27/15 1044    Subjective The patient is completing homework assignments   Currently in Pain? No/denies               ADULT SLP TREATMENT - 06/27/15 0001    General Information   Behavior/Cognition Alert;Cooperative;Pleasant mood;Confused;Impulsive;Distractible;Decreased sustained attention;Hard of hearing   HPI "Confusion"   Treatment Provided   Treatment provided Cognitive-Linquistic   Pain Assessment   Pain Assessment No/denies pain   Cognitive-Linquistic Treatment   Treatment focused on Cognition   Skilled  Treatment The patient did not bring his reading glasses, which limited available cognitive linguist activities.  VISUOSPATIAL: Determine next figure in a series given mod-max SLP cues.  Patient remained on task for this activity but requires significant support.  READING COMPREHENSION: select word to insert into blank in single sentence with 90% accuracy given reminders to be thorough.  Errors are secondary impulsive response. PATIENT/FAMILY EDUCATION: The patient's wife was present for today's session.  Discussed most functional outcome of speech therapy will be to find cognitively stimulating activities to help maintain cognitive function rather than expecting the patient to become more independent.   Assessment / Recommendations / Plan   Plan Continue with current plan of care   Progression Toward Goals   Progression toward goals Progressing toward goals          SLP Education - 06/27/15 1044    Education provided Yes   Education Details Draw on language skills for compensation of cognitive deficits   Person(s) Educated Patient;Spouse   Methods Explanation   Comprehension Verbalized understanding;Returned demonstration;Verbal cues required            SLP Long Term Goals - 06/13/15 1638    SLP LONG TERM GOAL #1   Title Patient will demonstrate functional cognitive-communication skills within supportive environment.  Time 8   Period Weeks   Status New   SLP LONG TERM GOAL #2   Title Patient will complete simple attention tasks with 80% accuracy.   Time 8   Period Weeks   Status New   SLP LONG TERM GOAL #3   Title Patient will complete simple visual-spatial activities with 80% accuracy.   Time 8   Period Weeks   Status New   SLP LONG TERM GOAL #4   Title Patient will complete simple executive function skills tasks with 80% accuracy.   Time 8   Period Weeks   Status New          Plan - 06/27/15 1045    Clinical Impression Statement The patient is staying on task and  completing simple cognitive-linguistic tasks with minimal cues/support from SLP; however, for more complex cognitive activities he requires support.  The patient demonstrates relative strength in language skills, but requires cues to take advantage of this strength. He is demonstrating impulsive behaviors but is more readily acknowledging the negative impact that has on accuracy.    Speech Therapy Frequency 2x / week   Duration Other (comment)   Treatment/Interventions Cognitive reorganization;Internal/external aids;Functional tasks;SLP instruction and feedback;Patient/family education   Potential to Achieve Goals Fair   Potential Considerations Ability to learn/carryover information;Co-morbidities;Cooperation/participation level;Medical prognosis;Previous level of function;Severity of impairments;Family/community support   SLP Home Exercise Plan Paper and pencil activity    Consulted and Agree with Plan of Care Patient;Family member/caregiver   Family Member Consulted Spouse        Problem List Patient Active Problem List   Diagnosis Date Noted  . Gout of foot 11/26/2014  . Generalized weakness 08/26/2014  . Hypomagnesemia 08/26/2014  . Intention tremor 06/07/2014  . Cognitive complaints with normal exam 06/07/2014  . Left sided sciatica 06/07/2014  . Musculoskeletal neck pain 01/19/2014  . Allergic rhinitis 12/04/2013  . Thalassemia 08/16/2011  . Tendonitis of wrist, right 08/16/2011  . GAD (generalized anxiety disorder) 05/14/2011  . Obesity 04/11/2011  . OSA on CPAP 04/11/2011  . Hyperlipidemia 11/22/2009  . HYPERTENSION, BENIGN 11/22/2009  . CAD, NATIVE VESSEL 11/22/2009   Leroy Sea, MS/CCC- SLP  Lou Miner 06/27/2015, 10:46 AM  [place] MAIN [facility] SERVICES [address] [place], [place], [address] Phone: [phone]   Fax:  [phone]   Name: DAYVEON HALLEY MRN: [id number] Date of Birth: Jan 21, 1951

## 2015-06-28 ENCOUNTER — Ambulatory Visit: Payer: Medicare Other | Admitting: Speech Pathology

## 2015-06-28 DIAGNOSIS — R41841 Cognitive communication deficit: Secondary | ICD-10-CM

## 2015-06-29 ENCOUNTER — Encounter: Payer: Self-pay | Admitting: Speech Pathology

## 2015-06-29 NOTE — Therapy (Signed)
[place] MAIN [facility] SERVICES [address] [place], [place], [address] Phone: [phone]   Fax:  [phone]  Speech Language Pathology Treatment  Patient Details  Name: Edwin Woodward MRN: [id number] Date of Birth: [birth year] Referring Provider: Anabel Bene., MD  Encounter Date: 06/28/2015      End of Session - 06/29/15 1550    Visit Number 5   Number of Visits 17   Date for SLP Re-Evaluation 08/12/15   SLP Start Time 1500   SLP Stop Time  1555   SLP Time Calculation (min) 55 min   Activity Tolerance Patient tolerated treatment well      Past Medical History  Diagnosis Date  . CAD (coronary artery disease)   . Presence of bare metal stent in right coronary artery     proximal RCA adn LAD in April 2009  . Hyperlipidemia   . Hypertension   . Cardiovascular stress test abnormal     Jan 2010 no significant ischemia  . Trigger finger     Past Surgical History  Procedure Laterality Date  . Replacement total knee    . Coronary artery bypass graft      2 vessel  . Joint replacement      total knee  . Appendectomy    . Carpal tunnel release    . Vasectomy    . Coronary angioplasty  12/2007  . Appendectomy    . Tonsillectomy    . Cataract extraction      right    There were no vitals filed for this visit.  Visit Diagnosis: Cognitive communication deficit      Subjective Assessment - 06/29/15 1549    Subjective The patient is attempting to complete assignments as directed, but becomes confused about instructions.   Currently in Pain? No/denies               ADULT SLP TREATMENT - 06/29/15 0001    General Information   Behavior/Cognition Alert;Cooperative;Pleasant mood;Confused;Impulsive;Distractible;Decreased sustained attention;Hard of hearing   HPI "Confusion"   Treatment Provided   Treatment provided Cognitive-Linquistic   Pain Assessment   Pain Assessment No/denies pain   Cognitive-Linquistic  Treatment   Treatment focused on Cognition   Skilled Treatment VISUOSPATIAL: Complete simple (10 dots) dot-to-dot activities with 90% accuracy.  Moderate mazes: trace moderate mazes with 80% accuracy given max training and cues.  LANGUAGE: Last session it was observed that the patient was not using his strength of language to aid completing cognitive tasks, so we will begin review and improvement of language skills. Write name of object given description with 90% accuracy and given help with spelling for multisyllabic words.  Write verb given definition with 70% accuracy; errors due to word finding and forgetting task rules.   Assessment / Recommendations / Plan   Plan Continue with current plan of care   Progression Toward Goals   Progression toward goals Progressing toward goals          SLP Education - 06/29/15 1549    Education provided Yes   Education Details Draw on language skills for compensation of cognitive deficits   Person(s) Educated Patient   Methods Explanation   Comprehension Verbalized understanding;Returned demonstration;Verbal cues required            SLP Long Term Goals - 06/13/15 1638    SLP LONG TERM GOAL #1   Title Patient will demonstrate functional cognitive-communication skills within supportive environment.   Time 8   Period  Weeks   Status New   SLP LONG TERM GOAL #2   Title Patient will complete simple attention tasks with 80% accuracy.   Time 8   Period Weeks   Status New   SLP LONG TERM GOAL #3   Title Patient will complete simple visual-spatial activities with 80% accuracy.   Time 8   Period Weeks   Status New   SLP LONG TERM GOAL #4   Title Patient will complete simple executive function skills tasks with 80% accuracy.   Time 8   Period Weeks   Status New          Plan - 06/29/15 1550    Clinical Impression Statement The patient is staying on task and completing simple cognitive-linguistic tasks with minimal cues/support from SLP.   He is demonstrating impulsive behaviors but is more readily acknowledging the negative impact that has on accuracy.  The patient exhibits shaking of RUE during paper and pencil activities, this abates with cues to relax and not hold the pen with as hard a grip.   Speech Therapy Frequency 2x / week   Duration Other (comment)   Treatment/Interventions Cognitive reorganization;Internal/external aids;Functional tasks;SLP instruction and feedback;Patient/family education   Potential to Achieve Goals Fair   Potential Considerations Ability to learn/carryover information;Co-morbidities;Cooperation/participation level;Medical prognosis;Previous level of function;Severity of impairments;Family/community support   SLP Home Exercise Plan Paper and pencil activity    Consulted and Agree with Plan of Care Patient        Problem List Patient Active Problem List   Diagnosis Date Noted  . Gout of foot 11/26/2014  . Generalized weakness 08/26/2014  . Hypomagnesemia 08/26/2014  . Intention tremor 06/07/2014  . Cognitive complaints with normal exam 06/07/2014  . Left sided sciatica 06/07/2014  . Musculoskeletal neck pain 01/19/2014  . Allergic rhinitis 12/04/2013  . Thalassemia 08/16/2011  . Tendonitis of wrist, right 08/16/2011  . GAD (generalized anxiety disorder) 05/14/2011  . Obesity 04/11/2011  . OSA on CPAP 04/11/2011  . Hyperlipidemia 11/22/2009  . HYPERTENSION, BENIGN 11/22/2009  . CAD, NATIVE VESSEL 11/22/2009   Leroy Sea, MS/CCC- SLP  Lou Miner 06/29/2015, 3:51 PM  [place] MAIN [facility] SERVICES [address] [place], [place], [address] Phone: [phone]   Fax:  [phone]   Name: Edwin Woodward MRN: [id number] Date of Birth: Jan 27, 1951

## 2015-07-03 ENCOUNTER — Ambulatory Visit: Payer: Medicare Other | Admitting: Speech Pathology

## 2015-07-03 ENCOUNTER — Encounter: Payer: Self-pay | Admitting: Speech Pathology

## 2015-07-03 DIAGNOSIS — R41841 Cognitive communication deficit: Secondary | ICD-10-CM

## 2015-07-03 NOTE — Therapy (Signed)
[place] MAIN [facility] SERVICES [address] [place], [place], [address] Phone: [phone]   Fax:  [phone]  Speech Language Pathology Treatment  Patient Details  Name: Edwin Woodward MRN: [id number] Date of Birth: [birth year] Referring Provider: Anabel Bene., MD  Encounter Date: 07/03/2015      End of Session - 07/03/15 1441    Visit Number 6   Number of Visits 17   Date for SLP Re-Evaluation 08/12/15   SLP Start Time 1311   SLP Stop Time  1401   SLP Time Calculation (min) 50 min   Activity Tolerance Patient tolerated treatment well      Past Medical History  Diagnosis Date  . CAD (coronary artery disease)   . Presence of bare metal stent in right coronary artery     proximal RCA adn LAD in April 2009  . Hyperlipidemia   . Hypertension   . Cardiovascular stress test abnormal     Jan 2010 no significant ischemia  . Trigger finger     Past Surgical History  Procedure Laterality Date  . Replacement total knee    . Coronary artery bypass graft      2 vessel  . Joint replacement      total knee  . Appendectomy    . Carpal tunnel release    . Vasectomy    . Coronary angioplasty  12/2007  . Appendectomy    . Tonsillectomy    . Cataract extraction      right    There were no vitals filed for this visit.  Visit Diagnosis: Cognitive communication deficit      Subjective Assessment - 07/03/15 1440    Subjective The patient states that he is understanding the value of the various cognitive tasks.   Currently in Pain? No/denies               ADULT SLP TREATMENT - 07/03/15 0001    General Information   Behavior/Cognition Alert;Cooperative;Pleasant mood;Confused;Impulsive;Distractible;Decreased sustained attention;Hard of hearing   HPI "Confusion"   Treatment Provided   Treatment provided Cognitive-Linquistic   Pain Assessment   Pain Assessment No/denies pain   Cognitive-Linquistic Treatment    Treatment focused on Cognition   Skilled Treatment VISUOSPATIAL: Complete simple (12-15 dots) dot-to-dot activities with 100% accuracy.  Organize data into separate columns as instructed with 80% accuracy.  LANGUAGE: Complete a variety of simple language tasks (copy words into categories, answer factual questions RE: short paragraphs, write 3 attributes of given item) with overall 80% accuracy and independence.  With more complex linguist tasks (follow moderately complex written directions) he requires up to mod-max cues to complete.   Assessment / Recommendations / Plan   Plan Continue with current plan of care   Progression Toward Goals   Progression toward goals Progressing toward goals          SLP Education - 07/03/15 1440    Education provided Yes   Education Details Go slow to improve output   Person(s) Educated Patient   Methods Explanation   Comprehension Verbalized understanding;Returned demonstration;Verbal cues required            SLP Long Term Goals - 06/13/15 1638    SLP LONG TERM GOAL #1   Title Patient will demonstrate functional cognitive-communication skills within supportive environment.   Time 8   Period Weeks   Status New   SLP LONG TERM GOAL #2   Title Patient will complete simple attention tasks with 80%  accuracy.   Time 8   Period Weeks   Status New   SLP LONG TERM GOAL #3   Title Patient will complete simple visual-spatial activities with 80% accuracy.   Time 8   Period Weeks   Status New   SLP LONG TERM GOAL #4   Title Patient will complete simple executive function skills tasks with 80% accuracy.   Time 8   Period Weeks   Status New          Plan - 07/03/15 1441    Clinical Impression Statement The patient is staying on task and completing simple cognitive-linguistic tasks with no significant cues/support from SLP; he requires fewer cues with more complex cognitive-linguistic tasks.  He is demonstrating impulsive behaviors but is more  readily acknowledging the negative impact that has on accuracy.  The patient did not exhibit the shaking of RUE during paper and pencil activities, and states he has been trying to loosen his grips.   Speech Therapy Frequency 2x / week   Duration Other (comment)   Treatment/Interventions Cognitive reorganization;Internal/external aids;Functional tasks;SLP instruction and feedback;Patient/family education   Potential to Achieve Goals Fair   Potential Considerations Ability to learn/carryover information;Co-morbidities;Cooperation/participation level;Medical prognosis;Previous level of function;Severity of impairments;Family/community support   SLP Home Exercise Plan Paper and pencil activity    Consulted and Agree with Plan of Care Patient        Problem List Patient Active Problem List   Diagnosis Date Noted  . Gout of foot 11/26/2014  . Generalized weakness 08/26/2014  . Hypomagnesemia 08/26/2014  . Intention tremor 06/07/2014  . Cognitive complaints with normal exam 06/07/2014  . Left sided sciatica 06/07/2014  . Musculoskeletal neck pain 01/19/2014  . Allergic rhinitis 12/04/2013  . Thalassemia 08/16/2011  . Tendonitis of wrist, right 08/16/2011  . GAD (generalized anxiety disorder) 05/14/2011  . Obesity 04/11/2011  . OSA on CPAP 04/11/2011  . Hyperlipidemia 11/22/2009  . HYPERTENSION, BENIGN 11/22/2009  . CAD, NATIVE VESSEL 11/22/2009   Leroy Sea, MS/CCC- SLP  Lou Miner 07/03/2015, 2:42 PM  [place] MAIN [facility] SERVICES [address] [place], [place], [address] Phone: [phone]   Fax:  [phone]   Name: Edwin Woodward MRN: [id number] Date of Birth: March 16, 1951

## 2015-07-05 ENCOUNTER — Ambulatory Visit: Payer: Medicare Other | Admitting: Speech Pathology

## 2015-07-05 ENCOUNTER — Encounter: Payer: Self-pay | Admitting: Speech Pathology

## 2015-07-05 DIAGNOSIS — R41841 Cognitive communication deficit: Secondary | ICD-10-CM

## 2015-07-05 NOTE — Therapy (Signed)
[place] MAIN [facility] SERVICES [address] [place], [place], [address] Phone: [phone]   Fax:  [phone]  Speech Language Pathology Treatment  Patient Details  Name: Edwin Woodward MRN: [id number] Date of Birth: [birth year] Referring Provider: Anabel Bene., MD  Encounter Date: 07/05/2015      End of Session - 07/05/15 1407    Visit Number 7   Number of Visits 17   Date for SLP Re-Evaluation 08/12/15   SLP Start Time 1310   SLP Stop Time  3893   SLP Time Calculation (min) 49 min   Activity Tolerance Patient tolerated treatment well      Past Medical History  Diagnosis Date  . CAD (coronary artery disease)   . Presence of bare metal stent in right coronary artery     proximal RCA adn LAD in April 2009  . Hyperlipidemia   . Hypertension   . Cardiovascular stress test abnormal     Jan 2010 no significant ischemia  . Trigger finger     Past Surgical History  Procedure Laterality Date  . Replacement total knee    . Coronary artery bypass graft      2 vessel  . Joint replacement      total knee  . Appendectomy    . Carpal tunnel release    . Vasectomy    . Coronary angioplasty  12/2007  . Appendectomy    . Tonsillectomy    . Cataract extraction      right    There were no vitals filed for this visit.  Visit Diagnosis: Cognitive communication deficit      Subjective Assessment - 07/05/15 1406    Subjective The patient reports that he feels that tasks are helping him get better.   Currently in Pain? No/denies               ADULT SLP TREATMENT - 07/05/15 0001    General Information   Behavior/Cognition Alert;Cooperative;Confused;Pleasant mood   HPI "Confusion"   Treatment Provided   Treatment provided Cognitive-Linquistic   Pain Assessment   Pain Assessment No/denies pain   Cognitive-Linquistic Treatment   Treatment focused on Cognition   Skilled Treatment VISUOSPATIAL: Organize data into  separate columns as instructed with 100% accuracy w/min cues for organization process. LANGUAGE: Complete a variety of simple language tasks (copy words into categories, writing attributes and writing word to description) with overall 80% accuracy and independence.  With more complex linguist tasks such as naming a quality that best describes an object,  he requires up to mod-max cues to complete.   Assessment / Recommendations / Plan   Plan Continue with current plan of care   Progression Toward Goals   Progression toward goals Progressing toward goals          SLP Education - 07/05/15 1406    Education provided Yes   Education Details Organizing information   Person(s) Educated Patient   Methods Explanation   Comprehension Verbalized understanding;Returned demonstration;Verbal cues required            SLP Long Term Goals - 06/13/15 1638    SLP LONG TERM GOAL #1   Title Patient will demonstrate functional cognitive-communication skills within supportive environment.   Time 8   Period Weeks   Status New   SLP LONG TERM GOAL #2   Title Patient will complete simple attention tasks with 80% accuracy.   Time 8   Period Weeks   Status New  SLP LONG TERM GOAL #3   Title Patient will complete simple visual-spatial activities with 80% accuracy.   Time 8   Period Weeks   Status New   SLP LONG TERM GOAL #4   Title Patient will complete simple executive function skills tasks with 80% accuracy.   Time 8   Period Weeks   Status New          Plan - 07/05/15 1407    Clinical Impression Statement The patient continue to demonstrate improved attention to task and completing simple cognitive linguistic tasks with minimal cueing. The patient appears aware when he is rushing and demonstrating impulsive behaviors.   Speech Therapy Frequency 2x / week   Duration Other (comment)  8 weeks   Treatment/Interventions Cognitive reorganization;Internal/external aids;Functional tasks;SLP  instruction and feedback;Patient/family education   Potential to Achieve Goals Fair   Potential Considerations Ability to learn/carryover information;Co-morbidities;Cooperation/participation level;Medical prognosis;Previous level of function;Severity of impairments;Family/community support   SLP Home Exercise Plan Calendar activity   Consulted and Agree with Plan of Care Patient        Problem List Patient Active Problem List   Diagnosis Date Noted  . Gout of foot 11/26/2014  . Generalized weakness 08/26/2014  . Hypomagnesemia 08/26/2014  . Intention tremor 06/07/2014  . Cognitive complaints with normal exam 06/07/2014  . Left sided sciatica 06/07/2014  . Musculoskeletal neck pain 01/19/2014  . Allergic rhinitis 12/04/2013  . Thalassemia 08/16/2011  . Tendonitis of wrist, right 08/16/2011  . GAD (generalized anxiety disorder) 05/14/2011  . Obesity 04/11/2011  . OSA on CPAP 04/11/2011  . Hyperlipidemia 11/22/2009  . HYPERTENSION, BENIGN 11/22/2009  . CAD, NATIVE VESSEL 11/22/2009    Eldora,Maaran 07/05/2015, 2:10 PM  [place] MAIN [facility] SERVICES [address] [place], [place], [address] Phone: [phone]   Fax:  [phone]   Name: Edwin Woodward MRN: [id number] Date of Birth: 01-31-1951

## 2015-07-09 ENCOUNTER — Ambulatory Visit: Payer: Medicare Other | Admitting: Speech Pathology

## 2015-07-09 ENCOUNTER — Encounter: Payer: Self-pay | Admitting: Speech Pathology

## 2015-07-09 DIAGNOSIS — R41841 Cognitive communication deficit: Secondary | ICD-10-CM | POA: Diagnosis not present

## 2015-07-09 NOTE — Therapy (Signed)
[place] MAIN [facility] SERVICES [address] [place], [place], [address] Phone: [phone]   Fax:  [phone]  Speech Language Pathology Treatment  Patient Details  Name: CARLTON BUSKEY MRN: [id number] Date of Birth: [birth year] Referring Provider: Anabel Bene., MD  Encounter Date: 07/09/2015      End of Session - 07/09/15 1458    Visit Number 8   Number of Visits 17   Date for SLP Re-Evaluation 08/12/15   SLP Start Time 1324   SLP Stop Time  4010   SLP Time Calculation (min) 50 min   Activity Tolerance Patient tolerated treatment well      Past Medical History  Diagnosis Date  . CAD (coronary artery disease)   . Presence of bare metal stent in right coronary artery     proximal RCA adn LAD in April 2009  . Hyperlipidemia   . Hypertension   . Cardiovascular stress test abnormal     Jan 2010 no significant ischemia  . Trigger finger     Past Surgical History  Procedure Laterality Date  . Replacement total knee    . Coronary artery bypass graft      2 vessel  . Joint replacement      total knee  . Appendectomy    . Carpal tunnel release    . Vasectomy    . Coronary angioplasty  12/2007  . Appendectomy    . Tonsillectomy    . Cataract extraction      right    There were no vitals filed for this visit.  Visit Diagnosis: No diagnosis found.      Subjective Assessment - 07/09/15 1457    Subjective The patient reports that he tries to take extra time to complete activities at home to try to prevent impulsivity and errors.   Currently in Pain? No/denies               ADULT SLP TREATMENT - 07/09/15 0001    General Information   Behavior/Cognition Alert;Cooperative;Confused;Pleasant mood   HPI "Confusion"   Treatment Provided   Treatment provided Cognitive-Linquistic   Pain Assessment   Pain Assessment No/denies pain   Cognitive-Linquistic Treatment   Treatment focused on Cognition   Skilled  Treatment VISUOSPATIAL: Organize data into separate columns as instructed with 100% accuracy w/min cues for organization process. LANGUAGE: When given expected category names, copied words into categories with 100% accuracy and independence. Without expected category names, sorted words into categories with 40% accuracy and independence and required maximum clinician cueing to improve to 80%.    Assessment / Recommendations / Plan   Plan Continue with current plan of care   Progression Toward Goals   Progression toward goals Progressing toward goals          SLP Education - 07/09/15 1458    Education provided Yes   Education Details Noticed used compensatory strategies and effectiveness   Person(s) Educated Patient   Methods Explanation   Comprehension Verbalized understanding            SLP Long Term Goals - 06/13/15 1638    SLP LONG TERM GOAL #1   Title Patient will demonstrate functional cognitive-communication skills within supportive environment.   Time 8   Period Weeks   Status New   SLP LONG TERM GOAL #2   Title Patient will complete simple attention tasks with 80% accuracy.   Time 8   Period Weeks   Status New  SLP LONG TERM GOAL #3   Title Patient will complete simple visual-spatial activities with 80% accuracy.   Time 8   Period Weeks   Status New   SLP LONG TERM GOAL #4   Title Patient will complete simple executive function skills tasks with 80% accuracy.   Time 8   Period Weeks   Status New          Plan - 07/09/15 1605    Clinical Impression Statement Clinical Impressions: The patient continues to demonstrate improved attention to task and completing simple cognitive linguistic tasks with minimal cueing. The patient appears aware when he is rushing and demonstrating impulsive behaviors. The patient had much greater difficulty with a more abstract language task and anxiety seemed to increase even with maximal cueing from clinician provided. Visuospatial  organization also suffered.   Speech Therapy Frequency 2x / week   Duration Other (comment)   Treatment/Interventions Cognitive reorganization;Internal/external aids;Functional tasks;SLP instruction and feedback;Patient/family education   Potential to Achieve Goals Fair   Potential Considerations Ability to learn/carryover information;Co-morbidities;Cooperation/participation level;Medical prognosis;Previous level of function;Severity of impairments;Family/community support   Consulted and Agree with Plan of Care Patient        Problem List Patient Active Problem List   Diagnosis Date Noted  . Gout of foot 11/26/2014  . Generalized weakness 08/26/2014  . Hypomagnesemia 08/26/2014  . Intention tremor 06/07/2014  . Cognitive complaints with normal exam 06/07/2014  . Left sided sciatica 06/07/2014  . Musculoskeletal neck pain 01/19/2014  . Allergic rhinitis 12/04/2013  . Thalassemia 08/16/2011  . Tendonitis of wrist, right 08/16/2011  . GAD (generalized anxiety disorder) 05/14/2011  . Obesity 04/11/2011  . OSA on CPAP 04/11/2011  . Hyperlipidemia 11/22/2009  . HYPERTENSION, BENIGN 11/22/2009  . CAD, NATIVE VESSEL 11/22/2009    Rocco Serene 07/09/2015, 4:08 PM  [place] MAIN [facility] SERVICES [address] [place], [place], [address] Phone: [phone]   Fax:  [phone]   Name: KASEN ADDUCI MRN: [id number] Date of Birth: 1951/06/01

## 2015-07-10 ENCOUNTER — Encounter: Payer: Medicare Other | Admitting: Speech Pathology

## 2015-07-12 ENCOUNTER — Telehealth: Payer: Self-pay | Admitting: *Deleted

## 2015-07-12 ENCOUNTER — Ambulatory Visit: Payer: Medicare Other | Attending: Neurology | Admitting: Speech Pathology

## 2015-07-12 DIAGNOSIS — R41841 Cognitive communication deficit: Secondary | ICD-10-CM

## 2015-07-12 MED ORDER — CLINDAMYCIN HCL 300 MG PO CAPS: ORAL_CAPSULE | ORAL | Status: DC

## 2015-07-12 NOTE — Telephone Encounter (Signed)
Clindamycin   rx sent to pharmacy.  Given the anticipated use of antibiotics this month, Please ask patient to  take a probiotic ( Align, Floraque or [employer]) or a generic equivalent every day for the entire month while you are on the antibiotic to prevent a serious antibiotic associated diarrhea  Called clostirudium dificile colitis

## 2015-07-12 NOTE — Telephone Encounter (Signed)
Patient has requested an antibiotic for dental work, patient has two appt. the month of November.

## 2015-07-12 NOTE — Telephone Encounter (Signed)
Please advise 

## 2015-07-12 NOTE — Telephone Encounter (Signed)
At [facility] office. Will route to Dr. Lupita Dawn CMA for further follow up.

## 2015-07-13 ENCOUNTER — Encounter: Payer: Self-pay | Admitting: Speech Pathology

## 2015-07-13 NOTE — Telephone Encounter (Signed)
Notified pt. 

## 2015-07-13 NOTE — Telephone Encounter (Signed)
Pt wife called you back. Thank You!

## 2015-07-13 NOTE — Therapy (Signed)
[place] MAIN [facility] SERVICES [address] [place], [place], [address] Phone: [phone]   Fax:  [phone]  Speech Language Pathology Treatment  Patient Details  Name: Edwin Woodward MRN: [id number] Date of Birth: August 16, 1951 Referring Provider: Anabel Bene., MD  Encounter Date: 07/12/2015      End of Session - 07/13/15 0904    Visit Number 9   Number of Visits 17   Date for SLP Re-Evaluation 08/12/15   SLP Start Time 1304   SLP Stop Time  8270   SLP Time Calculation (min) 51 min      Past Medical History  Diagnosis Date  . CAD (coronary artery disease)   . Presence of bare metal stent in right coronary artery     proximal RCA adn LAD in April 2009  . Hyperlipidemia   . Hypertension   . Cardiovascular stress test abnormal     Jan 2010 no significant ischemia  . Trigger finger     Past Surgical History  Procedure Laterality Date  . Replacement total knee    . Coronary artery bypass graft      2 vessel  . Joint replacement      total knee  . Appendectomy    . Carpal tunnel release    . Vasectomy    . Coronary angioplasty  12/2007  . Appendectomy    . Tonsillectomy    . Cataract extraction      right    There were no vitals filed for this visit.  Visit Diagnosis: Cognitive communication deficit      Subjective Assessment - 07/13/15 0902    Subjective The patient reports that he tries to take extra time to complete activities at home to try to prevent impulsivity and errors.   Currently in Pain? No/denies               ADULT SLP TREATMENT - 07/13/15 0001    General Information   Behavior/Cognition Alert;Cooperative;Confused;Pleasant mood;Impulsive;Distractible;Requires cueing;Hard of hearing   HPI "Confusion"   Treatment Provided   Treatment provided Cognitive-Linquistic   Pain Assessment   Pain Assessment No/denies pain   Cognitive-Linquistic Treatment   Treatment focused on Cognition   Skilled Treatment LANGUAGE: Complete a variety of simple language tasks (copy words into categories, label categories, select word to insert into blank in sentences) with overall 80% accuracy and independence.  With more complex linguist tasks (write adjective given definition) he requires up to mod-max cues to complete.   Assessment / Recommendations / Plan   Plan Continue with current plan of care   Progression Toward Goals   Progression toward goals Progressing toward goals          SLP Education - 07/13/15 0902    Education provided Yes   Education Details Need to curb impulsive response, allow himself time to problem solve when he sees an error   Person(s) Educated Patient   Methods Explanation   Comprehension Verbalized understanding            SLP Long Term Goals - 06/13/15 1638    SLP LONG TERM GOAL #1   Title Patient will demonstrate functional cognitive-communication skills within supportive environment.   Time 8   Period Weeks   Status New   SLP LONG TERM GOAL #2   Title Patient will complete simple attention tasks with 80% accuracy.   Time 8   Period Weeks   Status New   SLP LONG TERM GOAL #  3   Title Patient will complete simple visual-spatial activities with 80% accuracy.   Time 8   Period Weeks   Status New   SLP LONG TERM GOAL #4   Title Patient will complete simple executive function skills tasks with 80% accuracy.   Time 8   Period Weeks   Status New          Plan - 07/13/15 6270    Clinical Impression Statement The patient is staying on task and completing simple cognitive-linguistic tasks with no significant cues/support from SLP; he is requiring fewer cues with more complex cognitive-linguistic tasks.  He is demonstrating impulsive behaviors but is more readily acknowledging the negative impact that has on accuracy.     Speech Therapy Frequency 2x / week   Duration Other (comment)   Treatment/Interventions Cognitive  reorganization;Internal/external aids;Functional tasks;SLP instruction and feedback;Patient/family education   Potential to Achieve Goals Fair   Potential Considerations Ability to learn/carryover information;Co-morbidities;Cooperation/participation level;Medical prognosis;Previous level of function;Severity of impairments;Family/community support   Consulted and Agree with Plan of Care Patient        Problem List Patient Active Problem List   Diagnosis Date Noted  . Gout of foot 11/26/2014  . Generalized weakness 08/26/2014  . Hypomagnesemia 08/26/2014  . Intention tremor 06/07/2014  . Cognitive complaints with normal exam 06/07/2014  . Left sided sciatica 06/07/2014  . Musculoskeletal neck pain 01/19/2014  . Allergic rhinitis 12/04/2013  . Thalassemia 08/16/2011  . Tendonitis of wrist, right 08/16/2011  . GAD (generalized anxiety disorder) 05/14/2011  . Obesity 04/11/2011  . OSA on CPAP 04/11/2011  . Hyperlipidemia 11/22/2009  . HYPERTENSION, BENIGN 11/22/2009  . CAD, NATIVE VESSEL 11/22/2009   Leroy Sea, MS/CCC- SLP  Lou Miner 07/13/2015, 9:06 AM  [place] MAIN [facility] SERVICES [address] [place], [place], [address] Phone: [phone]   Fax:  [phone]   Name: EZEQUIEL MACAULEY MRN: [id number] Date of Birth: 04/26/1951

## 2015-07-13 NOTE — Telephone Encounter (Signed)
Left vm for pt to call the office

## 2015-07-16 ENCOUNTER — Other Ambulatory Visit: Payer: Self-pay | Admitting: Internal Medicine

## 2015-07-17 ENCOUNTER — Encounter: Payer: Self-pay | Admitting: Speech Pathology

## 2015-07-17 ENCOUNTER — Ambulatory Visit: Payer: Medicare Other | Admitting: Speech Pathology

## 2015-07-17 DIAGNOSIS — R41841 Cognitive communication deficit: Secondary | ICD-10-CM

## 2015-07-17 NOTE — Therapy (Signed)
[place] MAIN [facility] SERVICES [address] [place], [place], [address] Phone: [phone]   Fax:  [phone]  Speech Language Pathology Treatment/Progress Note  Patient Details  Name: Edwin Woodward MRN: [id number] Date of Birth: 1951-02-11 Referring Provider: Anabel Bene., MD  Encounter Date: 07/17/2015      End of Session - 07/17/15 1625    Visit Number 10   Number of Visits 17   Date for SLP Re-Evaluation 08/12/15   SLP Start Time 1330   SLP Stop Time  1430   SLP Time Calculation (min) 60 min   Activity Tolerance Patient tolerated treatment well      Past Medical History  Diagnosis Date  . CAD (coronary artery disease)   . Presence of bare metal stent in right coronary artery     proximal RCA adn LAD in April 2009  . Hyperlipidemia   . Hypertension   . Cardiovascular stress test abnormal     Jan 2010 no significant ischemia  . Trigger finger     Past Surgical History  Procedure Laterality Date  . Replacement total knee    . Coronary artery bypass graft      2 vessel  . Joint replacement      total knee  . Appendectomy    . Carpal tunnel release    . Vasectomy    . Coronary angioplasty  12/2007  . Appendectomy    . Tonsillectomy    . Cataract extraction      right    There were no vitals filed for this visit.  Visit Diagnosis: Cognitive communication deficit      Subjective Assessment - 07/17/15 1624    Subjective The patient reports that he tries to take extra time to complete activities at home to try to prevent impulsivity and errors.   Currently in Pain? No/denies           ADULT SLP TREATMENT - 07/17/15 0001    General Information   Behavior/Cognition Alert;Cooperative;Confused;Pleasant mood;Impulsive;Distractible;Requires cueing;Hard of hearing   HPI "Confusion"   Treatment Provided   Treatment provided Cognitive-Linquistic   Pain Assessment   Pain Assessment No/denies pain    Cognitive-Linquistic Treatment   Treatment focused on Cognition   Skilled Treatment LANGUAGE: Complete a variety of simple language tasks (simple analogies, select word to insert into blank in sentences) with overall 80% accuracy and independence.  With more complex linguist tasks (write adjective given definition, follow complex written directions) he requires up to mod-max cues to complete.   Assessment / Recommendations / Plan   Plan Continue with current plan of care   Progression Toward Goals   Progression toward goals Progressing toward goals          SLP Education - 07/17/15 1624    Education provided Yes   Education Details Need to curb impulsive response, allow himself time to problem solve when he sees an error   Person(s) Educated Patient   Methods Explanation   Comprehension Verbalized understanding            SLP Long Term Goals - 07/17/15 1625    SLP LONG TERM GOAL #1   Title Patient will demonstrate functional cognitive-communication skills within supportive environment.   Time 8   Period Weeks   Status Partially Met   SLP LONG TERM GOAL #2   Title Patient will complete simple attention tasks with 80% accuracy.   Time 8   Period Weeks   Status  Achieved   SLP LONG TERM GOAL #3   Title Patient will complete simple visual-spatial activities with 80% accuracy.   Time 8   Period Weeks   Status Partially Met   SLP LONG TERM GOAL #4   Title Patient will complete simple executive function skills tasks with 80% accuracy.   Time 8   Period Weeks   Status Partially Met          Plan - 2015-07-26 1625    Clinical Impression Statement The patient is staying on task and completing simple cognitive-linguistic tasks with no significant cues/support from SLP; he is requiring fewer cues with more complex cognitive-linguistic tasks.  He is demonstrating impulsive behaviors but is more readily acknowledging the negative impact that has on accuracy.             G-Codes - 26-Jul-2015 1627    Functional Assessment Tool Used ognitive linguistic treatment tasks, clinical judgment   Functional Limitations Attention   Attention Current Status [phone]) At least 40 percent but less than 60 percent impaired, limited or restricted   Attention Goal Status (Y6599) At least 20 percent but less than 40 percent impaired, limited or restricted      Problem List Patient Active Problem List   Diagnosis Date Noted  . Gout of foot 11/26/2014  . Generalized weakness 08/26/2014  . Hypomagnesemia 08/26/2014  . Intention tremor 06/07/2014  . Cognitive complaints with normal exam 06/07/2014  . Left sided sciatica 06/07/2014  . Musculoskeletal neck pain 01/19/2014  . Allergic rhinitis 12/04/2013  . Thalassemia 08/16/2011  . Tendonitis of wrist, right 08/16/2011  . GAD (generalized anxiety disorder) 05/14/2011  . Obesity 04/11/2011  . OSA on CPAP 04/11/2011  . Hyperlipidemia 11/22/2009  . HYPERTENSION, BENIGN 11/22/2009  . CAD, NATIVE VESSEL 11/22/2009   Leroy Sea, MS/CCC- SLP  Lou Miner 26-Jul-2015, 4:28 PM  [place] MAIN [facility] SERVICES [address] [place], [place], [address] Phone: [phone]   Fax:  [phone]   Name: Edwin Woodward MRN: [id number] Date of Birth: [birth year]

## 2015-07-19 ENCOUNTER — Ambulatory Visit: Payer: Medicare Other | Admitting: Speech Pathology

## 2015-07-19 DIAGNOSIS — R41841 Cognitive communication deficit: Secondary | ICD-10-CM

## 2015-07-20 ENCOUNTER — Encounter: Payer: Self-pay | Admitting: Speech Pathology

## 2015-07-20 NOTE — Therapy (Signed)
[place] MAIN [facility] SERVICES [address] [place], [place], [address] Phone: [phone]   Fax:  [phone]  Speech Language Pathology Treatment  Patient Details  Name: Edwin Woodward MRN: [id number] Date of Birth: 1951-03-31 Referring Provider: Gurney Maxin, MD  Encounter Date: 07/19/2015      End of Session - 07/20/15 0953    Visit Number 11   Number of Visits 17   Date for SLP Re-Evaluation 08/12/15   SLP Start Time 38   SLP Stop Time  1400   SLP Time Calculation (min) 60 min   Activity Tolerance Patient tolerated treatment well      Past Medical History  Diagnosis Date  . CAD (coronary artery disease)   . Presence of bare metal stent in right coronary artery     proximal RCA adn LAD in April 2009  . Hyperlipidemia   . Hypertension   . Cardiovascular stress test abnormal     Jan 2010 no significant ischemia  . Trigger finger     Past Surgical History  Procedure Laterality Date  . Replacement total knee    . Coronary artery bypass graft      2 vessel  . Joint replacement      total knee  . Appendectomy    . Carpal tunnel release    . Vasectomy    . Coronary angioplasty  12/2007  . Appendectomy    . Tonsillectomy    . Cataract extraction      right    There were no vitals filed for this visit.  Visit Diagnosis: Cognitive communication deficit      Subjective Assessment - 07/20/15 0952    Subjective The patient would like to work on his cursive writing.   Currently in Pain? No/denies           SLP Evaluation [facility] - 07/20/15 0001    SLP Visit Information   Referring Provider Gurney Maxin, MD            ADULT SLP TREATMENT - 07/20/15 0001    General Information   Behavior/Cognition Alert;Cooperative;Confused;Pleasant mood;Impulsive;Distractible;Requires cueing;Hard of hearing   HPI "Confusion"   Treatment Provided   Treatment provided Cognitive-Linquistic   Pain Assessment   Pain  Assessment No/denies pain   Cognitive-Linquistic Treatment   Treatment focused on Cognition   Skilled Treatment LANGUAGE: Complete a variety of simple language tasks (simple analogies, select word to insert into blank in sentences, answer questions RE: paragraph, sort 16 words into 4 groups, write lists given categories, read follow simple directions) with overall 80% accuracy and independence.  With more complex linguist tasks (follow complex written directions) he requires up to mod-max cues to complete.   Assessment / Recommendations / Plan   Plan Continue with current plan of care   Progression Toward Goals   Progression toward goals Progressing toward goals          SLP Education - 07/20/15 0953    Education Details Need to curb impulsive response, allow himself time to problem solve when he sees an error   Person(s) Educated Patient   Methods Explanation   Comprehension Verbalized understanding            SLP Long Term Goals - 07/17/15 1625    SLP LONG TERM GOAL #1   Title Patient will demonstrate functional cognitive-communication skills within supportive environment.   Time 8   Period Weeks   Status Partially Met   SLP LONG TERM  GOAL #2   Title Patient will complete simple attention tasks with 80% accuracy.   Time 8   Period Weeks   Status Achieved   SLP LONG TERM GOAL #3   Title Patient will complete simple visual-spatial activities with 80% accuracy.   Time 8   Period Weeks   Status Partially Met   SLP LONG TERM GOAL #4   Title Patient will complete simple executive function skills tasks with 80% accuracy.   Time 8   Period Weeks   Status Partially Met          Plan - 07/20/15 0954    Clinical Impression Statement The patient is staying on task and completing simple cognitive-linguistic tasks with no significant cues/support from SLP; he is requiring fewer cues with more complex cognitive-linguistic tasks.  He is demonstrating impulsive behaviors but is  more readily acknowledging the negative impact that has on accuracy.     Speech Therapy Frequency 2x / week   Duration Other (comment)   Treatment/Interventions Cognitive reorganization;Internal/external aids;Functional tasks;SLP instruction and feedback;Patient/family education   Potential to Achieve Goals Fair   Potential Considerations Ability to learn/carryover information;Co-morbidities;Cooperation/participation level;Medical prognosis;Previous level of function;Severity of impairments;Family/community support   SLP Home Exercise Plan Variety of cognitive-linguistic paper and pencil activities   Consulted and Agree with Plan of Care Patient        Problem List Patient Active Problem List   Diagnosis Date Noted  . Gout of foot 11/26/2014  . Generalized weakness 08/26/2014  . Hypomagnesemia 08/26/2014  . Intention tremor 06/07/2014  . Cognitive complaints with normal exam 06/07/2014  . Left sided sciatica 06/07/2014  . Musculoskeletal neck pain 01/19/2014  . Allergic rhinitis 12/04/2013  . Thalassemia 08/16/2011  . Tendonitis of wrist, right 08/16/2011  . GAD (generalized anxiety disorder) 05/14/2011  . Obesity 04/11/2011  . OSA on CPAP 04/11/2011  . Hyperlipidemia 11/22/2009  . HYPERTENSION, BENIGN 11/22/2009  . CAD, NATIVE VESSEL 11/22/2009   Leroy Sea, MS/CCC- SLP  Lou Miner 07/20/2015, 9:55 AM  [place] MAIN [facility] SERVICES [address] [place], [place], [address] Phone: [phone]   Fax:  [phone]   Name: Edwin Woodward MRN: [id number] Date of Birth: 1951-02-20

## 2015-07-24 ENCOUNTER — Ambulatory Visit: Payer: Medicare Other | Admitting: Speech Pathology

## 2015-07-26 ENCOUNTER — Ambulatory Visit (INDEPENDENT_AMBULATORY_CARE_PROVIDER_SITE_OTHER): Payer: Medicare Other | Admitting: Family Medicine

## 2015-07-26 ENCOUNTER — Ambulatory Visit: Payer: Medicare Other | Admitting: Speech Pathology

## 2015-07-26 ENCOUNTER — Encounter: Payer: Self-pay | Admitting: Family Medicine

## 2015-07-26 VITALS — BP 116/82 | HR 67 | Temp 98.2°F | Ht 64.0 in | Wt 185.0 lb

## 2015-07-26 DIAGNOSIS — I2510 Atherosclerotic heart disease of native coronary artery without angina pectoris: Secondary | ICD-10-CM

## 2015-07-26 DIAGNOSIS — J069 Acute upper respiratory infection, unspecified: Secondary | ICD-10-CM | POA: Diagnosis not present

## 2015-07-26 NOTE — Assessment & Plan Note (Signed)
Likely viral in nature. Exam unremarkable. No indication for antibiotics at this time. Supportive care.

## 2015-07-26 NOTE — Progress Notes (Signed)
   Subjective:  Patient ID: Edwin Woodward, male    DOB: 1951/03/20  Age: 64 y.o. MRN: [id number]  CC: URI symptoms  HPI:  64 year old male presents to clinic today with complaints of URI symptoms.  Patient reports that he has not felt well since this past Friday. He reports that he's having postnasal drip, ear popping, sore throat, and feeling "flushed". No known exacerbating or relieving factors. He reports he's currently taking his allergy medication. No known sick contacts. No reported fever. No purulent nasal discharge.   Social Hx   Social History   Social History  . Marital Status: Married    Spouse Name: N/A  . Number of Children: N/A  . Years of Education: N/A   Social History Main Topics  . Smoking status: Former Research scientist (life sciences)  . Smokeless tobacco: Never Used  . Alcohol Use: No  . Drug Use: Yes    Special: Marijuana  . Sexual Activity: Not Asked   Other Topics Concern  . None   Social History Narrative   Lives with wife.   Review of Systems  Constitutional: Negative for fever.  HENT: Positive for postnasal drip, sinus pressure and sore throat.    Objective:  BP 116/82 mmHg  Pulse 67  Temp(Src) 98.2 F (36.8 C) (Oral)  Ht 5\' 4"  (1.626 m)  Wt 185 lb (83.915 kg)  BMI 31.74 kg/m2  SpO2 95%  BP/Weight 07/26/2015 [id number] 0000000  Systolic BP 99991111 0000000 [id number]  Diastolic BP 82 66 80  Wt. (Lbs) 185 171 173.5  BMI 31.74 29.34 30.25   Physical Exam  Constitutional: He appears well-developed. No distress.  HENT:  Head: Normocephalic and atraumatic.  Right Ear: External ear normal.  Left Ear: External ear normal.  Mouth/Throat: Oropharynx is clear and moist. No oropharyngeal exudate.  Normal TMs bilaterally.  Neck: Neck supple.  Cardiovascular: Normal rate and regular rhythm.   Pulmonary/Chest: Effort normal. No respiratory distress. He has no wheezes. He has no rales.  Lymphadenopathy:    He has no cervical adenopathy.  Neurological: He is alert.  Vitals  reviewed.  Lab Results  Component Value Date   WBC 37.6* 09/05/2014   HGB 10.2* 09/05/2014   HCT 32.7* 09/05/2014   PLT 489* 09/05/2014   GLUCOSE 104* 12/07/2014   CHOL 120 08/29/2013   TRIG 84 08/29/2013   HDL 39* 08/29/2013   LDLCALC 64 08/29/2013   ALT 8 11/24/2014   AST 16 11/24/2014   NA 141 12/07/2014   K 4.0 12/07/2014   CL 107 12/07/2014   CREATININE 1.49 12/07/2014   BUN 21 12/07/2014   CO2 29 12/07/2014   TSH 2.10 01/18/2014   INR 2.2* 05/18/2009    Assessment & Plan:   Problem List Items Addressed This Visit    URI (upper respiratory infection) - Primary    Likely viral in nature. Exam unremarkable. No indication for antibiotics at this time. Supportive care.        Follow-up: PRN  Thersa Salt, DO

## 2015-07-26 NOTE — Progress Notes (Signed)
Pre visit review using our clinic review tool, if applicable. No additional management support is needed unless otherwise documented below in the visit note. 

## 2015-07-26 NOTE — Patient Instructions (Signed)
This is likely viral in nature.  Your exam was unremarkable.  Continue supportive care/allergy medications.  Call if you fail to improve or worsen.  Take care  Dr. Lacinda Axon

## 2015-07-31 ENCOUNTER — Ambulatory Visit: Payer: Medicare Other | Admitting: Speech Pathology

## 2015-07-31 DIAGNOSIS — Z23 Encounter for immunization: Secondary | ICD-10-CM | POA: Diagnosis not present

## 2015-07-31 DIAGNOSIS — R41841 Cognitive communication deficit: Secondary | ICD-10-CM | POA: Diagnosis not present

## 2015-08-01 ENCOUNTER — Encounter: Payer: Self-pay | Admitting: Speech Pathology

## 2015-08-01 NOTE — Therapy (Signed)
[place] MAIN [facility] SERVICES [address] [place], [place], [address] Phone: [phone]   Fax:  [phone]  Speech Language Pathology Treatment  Patient Details  Name: Edwin Woodward MRN: [id number] Date of Birth: [birth year] Referring Provider: Gurney Maxin, MD  Encounter Date: 07/31/2015      End of Session - 08/01/15 1707    Visit Number 12   Number of Visits 17   Date for SLP Re-Evaluation 08/12/15   SLP Start Time 1320   SLP Stop Time  1400   SLP Time Calculation (min) 40 min   Activity Tolerance Patient tolerated treatment well      Past Medical History  Diagnosis Date  . CAD (coronary artery disease)   . Presence of bare metal stent in right coronary artery     proximal RCA adn LAD in April 2009  . Hyperlipidemia   . Hypertension   . Cardiovascular stress test abnormal     Jan 2010 no significant ischemia  . Trigger finger     Past Surgical History  Procedure Laterality Date  . Replacement total knee    . Coronary artery bypass graft      2 vessel  . Joint replacement      total knee  . Appendectomy    . Carpal tunnel release    . Vasectomy    . Coronary angioplasty  12/2007  . Appendectomy    . Tonsillectomy    . Cataract extraction      right    There were no vitals filed for this visit.  Visit Diagnosis: Cognitive communication deficit      Subjective Assessment - 08/01/15 1706    Subjective The patient was initially mildly agitated RE: home life but was able to engage in therapeutic activities with minimal SLP re-direction   Currently in Pain? No/denies               ADULT SLP TREATMENT - 08/01/15 0001    General Information   Behavior/Cognition Alert;Cooperative;Confused;Pleasant mood;Impulsive;Distractible;Requires cueing;Hard of hearing   HPI "Confusion"   Treatment Provided   Treatment provided Cognitive-Linquistic   Pain Assessment   Pain Assessment No/denies pain   Cognitive-Linquistic Treatment   Treatment focused on Cognition   Skilled Treatment LANGUAGE: Complete a variety of simple language tasks (simple analogies, select word to insert into blank in sentences, answer questions RE: paragraph, sort 16 words into 4 groups, write lists given categories, read follow simple directions) with overall 80% accuracy and independence.  With more complex linguist tasks (follow complex written directions) he requires up to mod-max cues to complete.   Assessment / Recommendations / Plan   Plan Continue with current plan of care   Progression Toward Goals   Progression toward goals Progressing toward goals          SLP Education - 08/01/15 1707    Education provided Yes   Education Details Need to curb impulsive response, allow himself time to problem solve when he sees an error   Person(s) Educated Patient   Methods Explanation   Comprehension Verbalized understanding            SLP Long Term Goals - 07/17/15 1625    SLP LONG TERM GOAL #1   Title Patient will demonstrate functional cognitive-communication skills within supportive environment.   Time 8   Period Weeks   Status Partially Met   SLP LONG TERM GOAL #2   Title Patient will complete simple attention tasks  with 80% accuracy.   Time 8   Period Weeks   Status Achieved   SLP LONG TERM GOAL #3   Title Patient will complete simple visual-spatial activities with 80% accuracy.   Time 8   Period Weeks   Status Partially Met   SLP LONG TERM GOAL #4   Title Patient will complete simple executive function skills tasks with 80% accuracy.   Time 8   Period Weeks   Status Partially Met          Plan - 08/01/15 1708    Clinical Impression Statement The patient is staying on task and completing simple cognitive-linguistic tasks with no significant cues/support from SLP; he is requiring fewer cues with more complex cognitive-linguistic tasks.  He is demonstrating impulsive behaviors but is more  readily acknowledging the negative impact that has on accuracy.     Speech Therapy Frequency 2x / week   Duration Other (comment)   Treatment/Interventions Cognitive reorganization;Internal/external aids;Functional tasks;SLP instruction and feedback;Patient/family education   Potential to Achieve Goals Fair   Potential Considerations Ability to learn/carryover information;Co-morbidities;Cooperation/participation level;Medical prognosis;Previous level of function;Severity of impairments;Family/community support   SLP Home Exercise Plan Variety of cognitive-linguistic paper and pencil activities   Consulted and Agree with Plan of Care Patient        Problem List Patient Active Problem List   Diagnosis Date Noted  . URI (upper respiratory infection) 07/26/2015  . Gout of foot 11/26/2014  . Generalized weakness 08/26/2014  . Hypomagnesemia 08/26/2014  . Intention tremor 06/07/2014  . Cognitive complaints with normal exam 06/07/2014  . Left sided sciatica 06/07/2014  . Musculoskeletal neck pain 01/19/2014  . Allergic rhinitis 12/04/2013  . Thalassemia 08/16/2011  . Tendonitis of wrist, right 08/16/2011  . GAD (generalized anxiety disorder) 05/14/2011  . Obesity 04/11/2011  . OSA on CPAP 04/11/2011  . Hyperlipidemia 11/22/2009  . HYPERTENSION, BENIGN 11/22/2009  . CAD, NATIVE VESSEL 11/22/2009   Leroy Sea, MS/CCC- SLP  Lou Miner 08/01/2015, 5:09 PM  [place] MAIN [facility] SERVICES [address] [place], [place], [address] Phone: [phone]   Fax:  [phone]   Name: SVEN PINHEIRO MRN: [id number] Date of Birth: November 11, 1950

## 2015-08-07 ENCOUNTER — Ambulatory Visit: Payer: Medicare Other | Admitting: Speech Pathology

## 2015-08-07 DIAGNOSIS — R41841 Cognitive communication deficit: Secondary | ICD-10-CM | POA: Diagnosis not present

## 2015-08-08 ENCOUNTER — Encounter: Payer: Self-pay | Admitting: Speech Pathology

## 2015-08-08 NOTE — Therapy (Signed)
[place] MAIN [facility] SERVICES [address] [place], [place], [address] Phone: [phone]   Fax:  [phone]  Speech Language Pathology Treatment  Patient Details  Name: Edwin Woodward MRN: [id number] Date of Birth: [birth year] Referring Provider: Gurney Maxin, MD  Encounter Date: 08/07/2015      End of Session - 08/08/15 1415    Visit Number 13   Number of Visits 17   Date for SLP Re-Evaluation 08/12/15   SLP Start Time 92   SLP Stop Time  1356   SLP Time Calculation (min) 56 min   Activity Tolerance Patient tolerated treatment well      Past Medical History  Diagnosis Date  . CAD (coronary artery disease)   . Presence of bare metal stent in right coronary artery     proximal RCA adn LAD in April 2009  . Hyperlipidemia   . Hypertension   . Cardiovascular stress test abnormal     Jan 2010 no significant ischemia  . Trigger finger     Past Surgical History  Procedure Laterality Date  . Replacement total knee    . Coronary artery bypass graft      2 vessel  . Joint replacement      total knee  . Appendectomy    . Carpal tunnel release    . Vasectomy    . Coronary angioplasty  12/2007  . Appendectomy    . Tonsillectomy    . Cataract extraction      right    There were no vitals filed for this visit.  Visit Diagnosis: Cognitive communication deficit      Subjective Assessment - 08/08/15 1414    Subjective Patient states that he enjoys the worksheets he has been given   Currently in Pain? No/denies               ADULT SLP TREATMENT - 08/08/15 0001    General Information   Behavior/Cognition Alert;Cooperative;Confused;Pleasant mood;Impulsive;Distractible;Requires cueing;Hard of hearing   HPI "Confusion"   Treatment Provided   Treatment provided Cognitive-Linquistic   Pain Assessment   Pain Assessment No/denies pain   Cognitive-Linquistic Treatment   Treatment focused on Cognition   Skilled  Treatment LANGUAGE: Complete a variety of simple language tasks (simple analogies, select word to insert into blank in sentences, answer questions RE: paragraph, sort 16 words into 4 groups, write lists given categories, read follow simple directions) with overall 80% accuracy and independence.  With more complex linguist tasks (follow complex written directions) he requires up to mod-max cues to complete.   Assessment / Recommendations / Plan   Plan Continue with current plan of care   Progression Toward Goals   Progression toward goals Progressing toward goals          SLP Education - 08/08/15 1415    Education provided Yes   Education Details Need to curb impulsive response, allow himself time to problem solve when he sees an error   Person(s) Educated Patient   Methods Explanation   Comprehension Verbalized understanding            SLP Long Term Goals - 07/17/15 1625    SLP LONG TERM GOAL #1   Title Patient will demonstrate functional cognitive-communication skills within supportive environment.   Time 8   Period Weeks   Status Partially Met   SLP LONG TERM GOAL #2   Title Patient will complete simple attention tasks with 80% accuracy.   Time 8  Period Weeks   Status Achieved   SLP LONG TERM GOAL #3   Title Patient will complete simple visual-spatial activities with 80% accuracy.   Time 8   Period Weeks   Status Partially Met   SLP LONG TERM GOAL #4   Title Patient will complete simple executive function skills tasks with 80% accuracy.   Time 8   Period Weeks   Status Partially Met          Plan - 08/08/15 1416    Clinical Impression Statement The patient is staying on task and completing simple cognitive-linguistic tasks with no significant cues/support from SLP; he is requiring fewer cues with more complex cognitive-linguistic tasks.  He is demonstrating impulsive behaviors but is more readily acknowledging the negative impact that has on accuracy.     Speech  Therapy Frequency 2x / week   Duration Other (comment)   Treatment/Interventions Cognitive reorganization;Internal/external aids;Functional tasks;SLP instruction and feedback;Patient/family education   Potential to Achieve Goals Fair   Potential Considerations Ability to learn/carryover information;Co-morbidities;Cooperation/participation level;Medical prognosis;Previous level of function;Severity of impairments;Family/community support   SLP Home Exercise Plan Variety of cognitive-linguistic paper and pencil activities   Consulted and Agree with Plan of Care Patient        Problem List Patient Active Problem List   Diagnosis Date Noted  . URI (upper respiratory infection) 07/26/2015  . Gout of foot 11/26/2014  . Generalized weakness 08/26/2014  . Hypomagnesemia 08/26/2014  . Intention tremor 06/07/2014  . Cognitive complaints with normal exam 06/07/2014  . Left sided sciatica 06/07/2014  . Musculoskeletal neck pain 01/19/2014  . Allergic rhinitis 12/04/2013  . Thalassemia 08/16/2011  . Tendonitis of wrist, right 08/16/2011  . GAD (generalized anxiety disorder) 05/14/2011  . Obesity 04/11/2011  . OSA on CPAP 04/11/2011  . Hyperlipidemia 11/22/2009  . HYPERTENSION, BENIGN 11/22/2009  . CAD, NATIVE VESSEL 11/22/2009   Leroy Sea, MS/CCC- SLP  Lou Miner 08/08/2015, 2:17 PM  [place] MAIN [facility] SERVICES [address] [place], [place], [address] Phone: [phone]   Fax:  [phone]   Name: Edwin Woodward MRN: [id number] Date of Birth: Jul 24, 1951

## 2015-08-09 ENCOUNTER — Ambulatory Visit: Payer: Medicare Other | Attending: Neurology | Admitting: Speech Pathology

## 2015-08-09 DIAGNOSIS — R41841 Cognitive communication deficit: Secondary | ICD-10-CM | POA: Diagnosis not present

## 2015-08-10 ENCOUNTER — Encounter: Payer: Self-pay | Admitting: Speech Pathology

## 2015-08-10 NOTE — Therapy (Signed)
[place] MAIN [facility] SERVICES [address] [place], [place], [address] Phone: [phone]   Fax:  [phone]  Speech Language Pathology Treatment/Discharge Summary  Patient Details  Name: Edwin Woodward MRN: [id number] Date of Birth: 17-Oct-1950 Referring Provider: Gurney Maxin, MD  Encounter Date: 08/09/2015      End of Session - 08/10/15 1047    Visit Number 14   Number of Visits 17   Date for SLP Re-Evaluation 08/12/15   SLP Start Time 1315   SLP Stop Time  1410   SLP Time Calculation (min) 55 min   Activity Tolerance Patient tolerated treatment well      Past Medical History  Diagnosis Date  . CAD (coronary artery disease)   . Presence of bare metal stent in right coronary artery     proximal RCA adn LAD in April 2009  . Hyperlipidemia   . Hypertension   . Cardiovascular stress test abnormal     Jan 2010 no significant ischemia  . Trigger finger     Past Surgical History  Procedure Laterality Date  . Replacement total knee    . Coronary artery bypass graft      2 vessel  . Joint replacement      total knee  . Appendectomy    . Carpal tunnel release    . Vasectomy    . Coronary angioplasty  12/2007  . Appendectomy    . Tonsillectomy    . Cataract extraction      right    There were no vitals filed for this visit.  Visit Diagnosis: Cognitive communication deficit      Subjective Assessment - 08/10/15 1045    Subjective The patient states that he is enjoying the paper and pencil activities and will continue after discharge.  He appreciates that he can complete more complex tasks and appears to be more willing to take cues from others.   Currently in Pain? No/denies               ADULT SLP TREATMENT - 08/10/15 0001    General Information   Behavior/Cognition Alert;Cooperative;Confused;Pleasant mood;Impulsive;Distractible;Requires cueing;Hard of hearing   HPI "Confusion"   Treatment Provided   Treatment provided Cognitive-Linquistic   Pain Assessment   Pain Assessment No/denies pain   Cognitive-Linquistic Treatment   Treatment focused on Cognition   Skilled Treatment LANGUAGE: Complete a variety of simple language tasks (simple analogies, select word to insert into blank in sentences, answer questions RE: paragraph, sort 16 words into 4 groups, write lists given categories, read/follow simple directions) with overall 80% accuracy and independence.  With more complex linguist tasks (follow complex written directions) he requires up to mod-max cues to complete.   Assessment / Recommendations / Plan   Plan Discharge SLP treatment due to (comment)   Progression Toward Goals   Progression toward goals Goals met, education completed, patient discharged from [facility] Education - 08/10/15 1047    Education provided Yes   Education Details Allow others to help him complete complex tasks and activities   Person(s) Educated Patient   Methods Explanation   Comprehension Verbalized understanding            SLP Long Term Goals - 08/10/15 1048    SLP LONG TERM GOAL #1   Title Patient will demonstrate functional cognitive-communication skills within supportive environment.   Time 8   Period Weeks   Status Achieved  SLP LONG TERM GOAL #3   Title Patient will complete simple visual-spatial activities with 80% accuracy.   Time 8   Period Weeks   Status Achieved   SLP LONG TERM GOAL #4   Title Patient will complete simple executive function skills tasks with 80% accuracy.   Time 8   Period Weeks   Status Partially Met          Plan - 2015/08/30 1048    Clinical Impression Statement The patient is staying on task and completing simple cognitive-linguistic tasks with no significant cues/support from SLP; he is requiring fewer cues with more complex cognitive-linguistic tasks.  The patient is not able to be fully independent secondary cognitive impairment.  The patient  and his wife were given copies of a variety of language activities and sources for other cognitively stimulating activities.     Speech Therapy Frequency 2x / week   Duration Other (comment)   Treatment/Interventions Cognitive reorganization;Internal/external aids;Functional tasks;SLP instruction and feedback;Patient/family education   Potential to Achieve Goals Fair   Potential Considerations Ability to learn/carryover information;Co-morbidities;Cooperation/participation level;Medical prognosis;Previous level of function;Severity of impairments;Family/community support   SLP Home Exercise Plan Variety of cognitive-linguistic paper and pencil activities   Consulted and Agree with Plan of Care Patient   Family Member Consulted Spouse          G-Codes - 30-Aug-2015 1049    Functional Assessment Tool Used ognitive linguistic treatment tasks, clinical judgment   Functional Limitations Attention   Attention Current Status (Y3496) At least 40 percent but less than 60 percent impaired, limited or restricted   Attention Goal Status (L1643) At least 20 percent but less than 40 percent impaired, limited or restricted   Attention Discharge Status (D3912) At least 40 percent but less than 60 percent impaired, limited or restricted      Problem List Patient Active Problem List   Diagnosis Date Noted  . URI (upper respiratory infection) 07/26/2015  . Gout of foot 11/26/2014  . Generalized weakness 08/26/2014  . Hypomagnesemia 08/26/2014  . Intention tremor 06/07/2014  . Cognitive complaints with normal exam 06/07/2014  . Left sided sciatica 06/07/2014  . Musculoskeletal neck pain 01/19/2014  . Allergic rhinitis 12/04/2013  . Thalassemia 08/16/2011  . Tendonitis of wrist, right 08/16/2011  . GAD (generalized anxiety disorder) 05/14/2011  . Obesity 04/11/2011  . OSA on CPAP 04/11/2011  . Hyperlipidemia 11/22/2009  . HYPERTENSION, BENIGN 11/22/2009  . CAD, NATIVE VESSEL 11/22/2009   Leroy Sea, MS/CCC- SLP  Lou Miner 2015/08/30, 10:50 AM  [place] MAIN [facility] SERVICES [address] [place], [place], [address] Phone: [phone]   Fax:  [phone]   Name: Edwin Woodward MRN: [id number] Date of Birth: 10-May-1951

## 2015-08-13 ENCOUNTER — Other Ambulatory Visit: Payer: Self-pay | Admitting: Psychiatry

## 2015-08-15 ENCOUNTER — Encounter: Payer: Self-pay | Admitting: Cardiovascular Disease

## 2015-08-15 ENCOUNTER — Ambulatory Visit (INDEPENDENT_AMBULATORY_CARE_PROVIDER_SITE_OTHER): Payer: Medicare Other | Admitting: Cardiovascular Disease

## 2015-08-15 VITALS — BP 128/60 | HR 51 | Ht 63.0 in | Wt 184.0 lb

## 2015-08-15 DIAGNOSIS — G4733 Obstructive sleep apnea (adult) (pediatric): Secondary | ICD-10-CM

## 2015-08-15 DIAGNOSIS — E785 Hyperlipidemia, unspecified: Secondary | ICD-10-CM

## 2015-08-15 DIAGNOSIS — I10 Essential (primary) hypertension: Secondary | ICD-10-CM

## 2015-08-15 DIAGNOSIS — Z9989 Dependence on other enabling machines and devices: Secondary | ICD-10-CM

## 2015-08-15 DIAGNOSIS — I2510 Atherosclerotic heart disease of native coronary artery without angina pectoris: Secondary | ICD-10-CM

## 2015-08-15 DIAGNOSIS — E669 Obesity, unspecified: Secondary | ICD-10-CM

## 2015-08-15 NOTE — Progress Notes (Signed)
Patient ID: Edwin Woodward, male    DOB: 03-16-1951, 64 y.o.   MRN: [id number]  HPI Comments: [name] is a very pleasant 64 year-old gentleman who has a history of coronary artery disease, bare-metal stents placed to his proximal RCA and LAD in April 2009, history of total knee replacements bilaterally, hypertension, hyperlipidemia, anxiety and ADHD who presents for routine followup of his coronary artery disease  His ex-wife presents with him today. She reports he has new diagnosis of dementia, often tangential when he speaks Overall he reports that he has been doing well. He has recovered from previous issues in late 2015 He had tremendous weight loss at that time, weight has trended back upwards He has started exercising, walking on a regular basis Legs feel weak but able to get heart rate up to 120 bpm without any symptoms, walks for least 10 minutes No recent lab work available for review  EKG on today's visit shows normal sinus rhythm with rate 51 bpm, right bundle branch block, nonspecific ST and T wave abnormality no change compared to prior EKGs  Other past medical history  November 11 2015he developed foot pain, 07/21/2014 was told that he had plantar fasciitis by urgent care and told to stay in bed for 1 week. He became dehydrated, developed acute renal failure, change of mental status. EMTs were called November 20, diagnosed with gout, transferred to [facility] for rehabilitation. He had poor care at [facility], readmitted to the hospital with low magnesium, calcium. Had urinary tract infection, severe hypertension. Seen by Dr. Derrel Nip, in follow-up, diagnosed with upper respiratory infection/bronchitis, recommendation to start Levaquin. This was not started for 1 week while he was in the hospital. Symptoms got worse and he developed pneumonia. After discharge with sent to [facility].   Allergies  Allergen Reactions  . Biaxin [Clarithromycin]   . Clarithromycin   .  Codeine     Sever vomiting   . Penicillins   . Promethazine     Outpatient Encounter Prescriptions as of 08/15/2015  Medication Sig  . alfuzosin (UROXATRAL) 10 MG 24 hr tablet Take 10 mg by mouth daily.    Marland Kitchen atorvastatin (LIPITOR) 40 MG tablet Take 40 mg alt with 20 mg daily.  Marland Kitchen buPROPion (WELLBUTRIN SR) 150 MG 12 hr tablet TAKE 1 TABLET BY MOUTH TWICE A DAY  . calcium carbonate 1250 MG capsule Take 1,250 mg by mouth 2 (two) times daily with a meal.  . clindamycin (CLEOCIN) 300 MG capsule 2 CAPSULES ONE HOUR BEFORE DENTAL PROCEDURE  . clopidogrel (PLAVIX) 75 MG tablet TAKE 1 TABLET (75 MG TOTAL) BY MOUTH DAILY.  Marland Kitchen colchicine (COLCRYS) 0.6 MG tablet Take 1 tablet (0.6 mg total) by mouth daily. (Patient taking differently: Take 0.6 mg by mouth as needed. )  . diclofenac sodium (VOLTAREN) 1 % GEL Apply 1 application topically as needed.  Marland Kitchen MAGNESIUM PO Take by mouth 2 (two) times daily.  . metoprolol tartrate (LOPRESSOR) 25 MG tablet TAKE 1 TABLET (25 MG TOTAL) BY MOUTH 2 (TWO) TIMES DAILY.  . nitroGLYCERIN (NITROSTAT) 0.4 MG SL tablet Place 1 tablet (0.4 mg total) under the tongue every 5 (five) minutes as needed.  . pantoprazole (PROTONIX) 40 MG tablet TAKE 1 TABLET (40 MG TOTAL) BY MOUTH DAILY.  Marland Kitchen PARoxetine (PAXIL-CR) 25 MG 24 hr tablet Take 1 tablet (25 mg total) by mouth every morning.  Marland Kitchen QUEtiapine (SEROQUEL) 25 MG tablet Take 2 tablets (50 mg total) by mouth at bedtime. (Patient taking  differently: Take 25 mg by mouth at bedtime. )  . aspirin EC 81 MG tablet Take 1 tablet (81 mg total) by mouth daily.  . [DISCONTINUED] ALPRAZolam (XANAX XR) 0.5 MG 24 hr tablet Take 1 tablet (0.5 mg total) by mouth daily. (Patient not taking: Reported on 08/15/2015)  . [DISCONTINUED] LIPITOR 40 MG tablet TAKE 1 TABLET (40 MG TOTAL) BY MOUTH DAILY. (Patient not taking: Reported on 08/15/2015)  . [DISCONTINUED] magnesium gluconate (MAGONATE) 500 MG tablet Take 500 mg by mouth 2 (two) times daily.  .  [DISCONTINUED] Magnesium Oxide 400 MG CAPS Take 1 capsule (400 mg total) by mouth 2 (two) times daily after a meal. (Patient not taking: Reported on 08/15/2015)   No facility-administered encounter medications on file as of 08/15/2015.    Past Medical History  Diagnosis Date  . CAD (coronary artery disease)   . Presence of bare metal stent in right coronary artery     proximal RCA adn LAD in April 2009  . Hyperlipidemia   . Hypertension   . Cardiovascular stress test abnormal     Jan 2010 no significant ischemia  . Trigger finger     Past Surgical History  Procedure Laterality Date  . Replacement total knee    . Coronary artery bypass graft      2 vessel  . Joint replacement      total knee  . Appendectomy    . Carpal tunnel release    . Vasectomy    . Coronary angioplasty  12/2007  . Appendectomy    . Tonsillectomy    . Cataract extraction      right    Social History  reports that he has quit smoking. He has never used smokeless tobacco. He reports that he does not drink alcohol or use illicit drugs.  Family History family history includes Diabetes in his mother; Heart disease in his father; Hypertension in his mother; Thalassemia in his mother.   Review of Systems  Constitutional: Negative.   Respiratory: Negative.   Cardiovascular: Negative.   Gastrointestinal: Negative.   Musculoskeletal: Negative.        Trigger finger  Skin: Negative.   Neurological: Negative.   Hematological: Negative.   Psychiatric/Behavioral: Positive for confusion.  All other systems reviewed and are negative.   BP 128/60 mmHg  Pulse 51  Ht 5\' 3"  (1.6 m)  Wt 184 lb (83.462 kg)  BMI 32.60 kg/m2  Physical Exam  Constitutional: He is oriented to person, place, and time. He appears well-developed and well-nourished.  Appears pale, weak, walks with a cane  HENT:  Head: Normocephalic.  Nose: Nose normal.  Mouth/Throat: Oropharynx is clear and moist.  Eyes: Conjunctivae are normal.  Pupils are equal, round, and reactive to light.  Neck: Normal range of motion. Neck supple. No JVD present.  Cardiovascular: Normal rate, regular rhythm, S1 normal, S2 normal, normal heart sounds and intact distal pulses.  Exam reveals no gallop and no friction rub.   No murmur heard. Pulmonary/Chest: Effort normal and breath sounds normal. No respiratory distress. He has no wheezes. He has no rales. He exhibits no tenderness.  Abdominal: Soft. Bowel sounds are normal. He exhibits no distension. There is no tenderness.  Musculoskeletal: Normal range of motion. He exhibits no edema or tenderness.  Lymphadenopathy:    He has no cervical adenopathy.  Neurological: He is alert and oriented to person, place, and time. Coordination normal.  Skin: Skin is warm and dry. No rash noted. No erythema.  Psychiatric: He has a normal mood and affect. His behavior is normal. Judgment and thought content normal.      Assessment and Plan   Nursing note and vitals reviewed.

## 2015-08-15 NOTE — Assessment & Plan Note (Signed)
Recommended he decrease the metoprolol back to 12.5 mg twice a day. Heart rate 51

## 2015-08-15 NOTE — Assessment & Plan Note (Signed)
He reports that he has been more compliant with his CPAP

## 2015-08-15 NOTE — Patient Instructions (Addendum)
You are doing well.  Please decrease the metoprolol down to 1/2 pill twice a day  We will check labs (BMP,  lfts, lipids)  Please call us if you have new issues that need to be addressed before your next appt.  Your physician wants you to follow-up in: 12 months.  You will receive a reminder letter in the mail two months in advance. If you don't receive a letter, please call our office to schedule the follow-up appointment.

## 2015-08-15 NOTE — Assessment & Plan Note (Signed)
He has started an exercise program, aerobic Recommended he try to do this daily

## 2015-08-15 NOTE — Assessment & Plan Note (Signed)
Currently with no symptoms of angina. No further workup at this time. Continue current medication regimen. 

## 2015-08-15 NOTE — Assessment & Plan Note (Signed)
We have placed an order for him to have cholesterol and LFTs done at his convenience as well as BMP

## 2015-10-02 ENCOUNTER — Telehealth: Payer: Self-pay | Admitting: *Deleted

## 2015-10-02 NOTE — Telephone Encounter (Signed)
FYI P will be seen by Dr.Sonnenberg, Patient's wife wanting to alert Dr. Caryl Bis of patient's new health issues. His new diagnosis;fronto temporal lobar degeneration. He has some delusional and anger issues.  Patient's wife expressed that he stated that he had a left ear, tooth and eye pain., however tomorrow he may not have any symptoms, due to his diagnosis. FYI

## 2015-10-02 NOTE — Telephone Encounter (Signed)
Please advise 

## 2015-10-02 NOTE — Telephone Encounter (Signed)
Does this need to go on Dr Lupita Dawn appointment list ? Please advise

## 2015-10-03 ENCOUNTER — Ambulatory Visit (INDEPENDENT_AMBULATORY_CARE_PROVIDER_SITE_OTHER): Payer: BLUE CROSS/BLUE SHIELD | Admitting: Family Medicine

## 2015-10-03 VITALS — BP 126/84 | HR 57 | Temp 98.2°F | Ht 63.0 in | Wt 184.8 lb

## 2015-10-03 DIAGNOSIS — J309 Allergic rhinitis, unspecified: Secondary | ICD-10-CM | POA: Diagnosis not present

## 2015-10-03 MED ORDER — FLUTICASONE PROPIONATE 50 MCG/ACT NA SUSP: 2.0000 | Freq: Every day | NASAL | Status: DC

## 2015-10-03 NOTE — Patient Instructions (Signed)
Nice to meet you. Your symptoms would appear to be due to allergies. I did contact her neurologist regarding the risperidone as this could be causing some rhinitis. We will treat her with Flonase and please start Claritin. If you develop worsening symptoms, eye pain, vision changes, fevers, cough, shortness of breath , worsening symptoms, or change in symptoms please seek medical attention.

## 2015-10-03 NOTE — Telephone Encounter (Signed)
FYI patient coming today for appointment

## 2015-10-03 NOTE — Progress Notes (Signed)
Pre visit review using our clinic review tool, if applicable. No additional management support is needed unless otherwise documented below in the visit note. 

## 2015-10-04 ENCOUNTER — Encounter: Payer: Self-pay | Admitting: Family Medicine

## 2015-10-04 NOTE — Assessment & Plan Note (Addendum)
Patient's symptoms of left earache, postnasal drip, sneezing, rhinorrhea, and itching watering eyes are consistent with allergic rhinitis. Suspect that he rubbed his eye the point that he had a subconjunctival hemorrhage. Vision is near normal. No eye pain. We will treat his rhinitis with Claritin and Flonase. He is on risperidone which on review can cause rhinitis symptoms. I advised the patient's wife to call his neurologist to discuss this. He is neurologically intact today. He will follow up with his neurologist regarding the left face numbness. This would appear to be a peripheral issue given that it occurs in the forehead and lower face. Also advised that he should follow-up with an ophthalmologist as he has had to hold reading material at distance and bring it close to his face to repeat it. He was advised to follow-up with his dentist regarding his crown. He is given return precautions.

## 2015-10-04 NOTE — Progress Notes (Signed)
Patient ID: Edwin Woodward, male   DOB: Aug 07, 1951, 65 y.o.   MRN: [id number]  Edwin Rumps, MD Phone: [phone]  Edwin Woodward is a 65 y.o. male who presents today for same-day visit.   Patient notes for the last several days he has had a left ear ache and postnasal drip. He notes some sneezing and rhinorrhea as well. Notes his eyes have been itchy and watery as well. Notes he has been rubbing his eyes. Notes 2 days ago his left eye became completely bloodshot. Has improved significantly since then and now only a small portion of the inferior aspect of the conjunctiva is red. No eye pain with this. He states there is itching and sometimes burning sensation in his eyes. Notes also intermittently for a long time having intermittent left face numbness in his forehead and lower face. Denies fevers, cough, and shortness of breath. Notes his vision is normal at this time. Also notes he lost a crown from his left upper posterior tooth. He also notes for the last 5-6 months he is intermittently had to hold reading material faraway and then up close to be able to read  PMH: Former smoker.   ROS see history of present illness  Objective  Physical Exam Filed Vitals:   10/03/15 1419  BP: 126/84  Pulse: 57  Temp: 98.2 F (36.8 C)    BP Readings from Last 3 Encounters:  10/03/15 126/84  08/15/15 128/60  07/26/15 116/82   Wt Readings from Last 3 Encounters:  10/03/15 184 lb 12.8 oz (83.825 kg)  08/15/15 184 lb (83.462 kg)  07/26/15 185 lb (83.915 kg)    Physical Exam  Constitutional: He is well-developed, well-nourished, and in no distress.  HENT:  Head: Normocephalic and atraumatic.  Right Ear: External ear normal.  Left Ear: External ear normal.  Mouth/Throat: Oropharynx is clear and moist. No oropharyngeal exudate.  Normal TMs bilaterally, poor dentition, left posterior upper teeth lacking molar  Eyes: Pupils are equal, round, and reactive to light.  Left eye inferior  portion of the conjunctiva with small area of subconjunctival hemorrhage, normal iris and cornea appearance Normal right eye  Neck: Neck supple.  Cardiovascular: Normal rate, regular rhythm and normal heart sounds.  Exam reveals no gallop and no friction rub.   No murmur heard. Pulmonary/Chest: Effort normal and breath sounds normal. No respiratory distress. He has no wheezes. He has no rales.  Lymphadenopathy:    He has no cervical adenopathy.  Neurological: He is alert.  CN 2-12 intact, 5/5 strength in bilateral biceps, triceps, grip, quads, hamstrings, plantar and dorsiflexion, sensation to light touch intact in bilateral UE and LE, slow gait, 2+ patellar reflexes  Skin: He is not diaphoretic.     Assessment/Plan: Please see individual problem list.  Allergic rhinitis Patient's symptoms of left earache, postnasal drip, sneezing, rhinorrhea, and itching watering eyes are consistent with allergic rhinitis. Suspect that he rubbed his eye the point that he had a subconjunctival hemorrhage. Vision is near normal. No eye pain. We will treat his rhinitis with Claritin and Flonase. He is on risperidone which on review can cause rhinitis symptoms. I advised the patient's wife to call his neurologist to discuss this. He is neurologically intact today. He will follow up with his neurologist regarding the left face numbness. This would appear to be a peripheral issue given that it occurs in the forehead and lower face. Also advised that he should follow-up with an ophthalmologist as he has had to  hold reading material at distance and bring it close to his face to repeat it. He was advised to follow-up with his dentist regarding his crown. He is given return precautions.     Edwin Woodward

## 2015-10-04 NOTE — Telephone Encounter (Signed)
Noted. Patient seen in the office.

## 2015-10-18 ENCOUNTER — Ambulatory Visit: Payer: Self-pay | Admitting: Psychiatry

## 2015-10-22 ENCOUNTER — Ambulatory Visit: Payer: Medicare Other | Admitting: Psychiatry

## 2015-11-01 ENCOUNTER — Other Ambulatory Visit
Admission: RE | Admit: 2015-11-01 | Discharge: 2015-11-01 | Disposition: A | Discharge status: Home / Self Care | Payer: Medicare Other | Source: Ambulatory Visit | Attending: Cardiovascular Disease | Admitting: Cardiovascular Disease

## 2015-11-01 DIAGNOSIS — E785 Hyperlipidemia, unspecified: Secondary | ICD-10-CM

## 2015-11-01 DIAGNOSIS — I2510 Atherosclerotic heart disease of native coronary artery without angina pectoris: Secondary | ICD-10-CM

## 2015-11-01 DIAGNOSIS — I10 Essential (primary) hypertension: Secondary | ICD-10-CM | POA: Diagnosis present

## 2015-11-01 LAB — HEPATIC FUNCTION PANEL
ALT: 18 U/L (ref 17–63)
AST: 22 U/L (ref 15–41)
Albumin: 4.2 g/dL (ref 3.5–5.0)
Alkaline Phosphatase: 67 U/L (ref 38–126)
BILIRUBIN TOTAL: 0.8 mg/dL (ref 0.3–1.2)
Total Protein: 7 g/dL (ref 6.5–8.1)

## 2015-11-01 LAB — LIPID PANEL
Cholesterol: 126 mg/dL (ref 0–200)
HDL: 36 mg/dL — AB (ref >40)
LDL CALC: 70 mg/dL (ref 0–99)
TRIGLYCERIDES: 98 mg/dL (ref <150)
Total CHOL/HDL Ratio: 3.5 RATIO
VLDL: 20 mg/dL (ref 0–40)

## 2015-11-01 LAB — BASIC METABOLIC PANEL
Anion gap: 6 (ref 5–15)
BUN: 25 mg/dL — AB (ref 6–20)
CALCIUM: 9.1 mg/dL (ref 8.9–10.3)
CO2: 28 mmol/L (ref 22–32)
Chloride: 103 mmol/L (ref 101–111)
Creatinine, Ser: 1.32 mg/dL — ABNORMAL HIGH (ref 0.61–1.24)
GFR calc Af Amer: >60 mL/min (ref >60)
GFR, EST NON AFRICAN AMERICAN: 55 mL/min — AB (ref >60)
GLUCOSE: 106 mg/dL — AB (ref 65–99)
Potassium: 4.4 mmol/L (ref 3.5–5.1)
Sodium: 137 mmol/L (ref 135–145)

## 2015-11-05 ENCOUNTER — Telehealth: Payer: Self-pay | Admitting: *Deleted

## 2015-11-05 NOTE — Telephone Encounter (Signed)
Result Note     Outstanding cholesterol.    Continue current meds,     check again in one year    Results given to patients wife as I was also giving her results as well. She verbalizes understanding and had no further questions.

## 2016-01-02 ENCOUNTER — Other Ambulatory Visit: Payer: Self-pay | Admitting: Internal Medicine

## 2016-01-02 NOTE — Telephone Encounter (Signed)
Pt needs an appt with Dr. Derrel Nip.  Will refill once appt is scheduled. Last saw [facility]o in March 2016

## 2016-01-03 ENCOUNTER — Telehealth: Payer: Self-pay | Admitting: Internal Medicine

## 2016-01-03 MED ORDER — PANTOPRAZOLE SODIUM 40 MG PO TBEC: DELAYED_RELEASE_TABLET | ORAL | Status: DC

## 2016-01-03 NOTE — Telephone Encounter (Signed)
Pt's wife is requesting a refill on pantoprazole (PROTONIX) 40 MG tablet for her husband.

## 2016-01-03 NOTE — Telephone Encounter (Signed)
Refilled. Needs a F/U with PCP.

## 2016-01-15 ENCOUNTER — Other Ambulatory Visit: Payer: Self-pay | Admitting: Internal Medicine

## 2016-02-02 ENCOUNTER — Other Ambulatory Visit: Payer: Self-pay | Admitting: Internal Medicine

## 2016-02-14 ENCOUNTER — Telehealth: Payer: Self-pay | Admitting: *Deleted

## 2016-02-14 MED ORDER — CLINDAMYCIN HCL 300 MG PO CAPS: ORAL_CAPSULE | ORAL | Status: DC

## 2016-02-14 NOTE — Telephone Encounter (Signed)
Script filled.

## 2016-02-14 NOTE — Telephone Encounter (Signed)
Patient will see the dentist 02/19/16, for the cleaning. Patient requested Clindamycin to be filled. Pharmacy  [facility] in [place]

## 2016-03-02 ENCOUNTER — Other Ambulatory Visit: Payer: Self-pay | Admitting: Psychiatry

## 2016-03-03 ENCOUNTER — Ambulatory Visit: Payer: Medicare Other | Admitting: Internal Medicine

## 2016-03-03 ENCOUNTER — Other Ambulatory Visit: Payer: Self-pay | Admitting: *Deleted

## 2016-03-03 MED ORDER — CLOPIDOGREL BISULFATE 75 MG PO TABS: ORAL_TABLET | ORAL | Status: DC

## 2016-03-03 NOTE — Telephone Encounter (Signed)
Requested Prescriptions   Signed Prescriptions Disp Refills  . clopidogrel (PLAVIX) 75 MG tablet 90 tablet 3    Sig: TAKE 1 TABLET (75 MG TOTAL) BY MOUTH DAILY.    Authorizing Provider: Minna Merritts    Ordering User: Britt Bottom

## 2016-03-31 ENCOUNTER — Encounter: Payer: Self-pay | Admitting: Internal Medicine

## 2016-03-31 ENCOUNTER — Ambulatory Visit (INDEPENDENT_AMBULATORY_CARE_PROVIDER_SITE_OTHER): Payer: Medicare Other | Admitting: Internal Medicine

## 2016-03-31 VITALS — BP 110/64 | HR 56 | Temp 97.7°F | Resp 16 | Wt 179.0 lb

## 2016-03-31 DIAGNOSIS — E669 Obesity, unspecified: Secondary | ICD-10-CM

## 2016-03-31 DIAGNOSIS — N183 Chronic kidney disease, stage 3 unspecified: Secondary | ICD-10-CM

## 2016-03-31 DIAGNOSIS — Z79899 Other long term (current) drug therapy: Secondary | ICD-10-CM | POA: Diagnosis not present

## 2016-03-31 DIAGNOSIS — G1220 Motor neuron disease, unspecified: Secondary | ICD-10-CM

## 2016-03-31 DIAGNOSIS — Z125 Encounter for screening for malignant neoplasm of prostate: Secondary | ICD-10-CM | POA: Diagnosis not present

## 2016-03-31 DIAGNOSIS — F0280 Dementia in other diseases classified elsewhere without behavioral disturbance: Secondary | ICD-10-CM

## 2016-03-31 DIAGNOSIS — E559 Vitamin D deficiency, unspecified: Secondary | ICD-10-CM

## 2016-03-31 DIAGNOSIS — F0391 Unspecified dementia with behavioral disturbance: Secondary | ICD-10-CM

## 2016-03-31 DIAGNOSIS — E785 Hyperlipidemia, unspecified: Secondary | ICD-10-CM

## 2016-03-31 DIAGNOSIS — I10 Essential (primary) hypertension: Secondary | ICD-10-CM

## 2016-03-31 DIAGNOSIS — G3109 Other frontotemporal dementia: Secondary | ICD-10-CM | POA: Diagnosis not present

## 2016-03-31 DIAGNOSIS — K219 Gastro-esophageal reflux disease without esophagitis: Secondary | ICD-10-CM

## 2016-03-31 MED ORDER — METOPROLOL TARTRATE 25 MG PO TABS: 12.5000 mg | ORAL_TABLET | Freq: Two times a day (BID) | ORAL | 6 refills | Status: DC

## 2016-03-31 MED ORDER — PANTOPRAZOLE SODIUM 40 MG PO TBEC: DELAYED_RELEASE_TABLET | ORAL | 5 refills | Status: DC

## 2016-03-31 NOTE — Patient Instructions (Signed)
  We discussed my concern about continuing your PPI (Protonix) in light of the recently published studies suggesting an association with increased risk of dementia and kidney failure.  I advised  you to try using  Famotidine (generic Pepcid)  20 mg  twice daily.  This available at [employer]   if your reflux symptoms are controlled,  You can Continue the daily h2 blocker. If not,  You should resume Protonix     You might want to try a premixed protein drink called Premier Protein shake in the morning.  It is less  Calories that Ensure ,  and very low sugar.    160 cal  30 g protein  1 g sugar 50% calcium needs  Many more variety in flavors at [facility] .  May satisfy cravings for dessert!  You need to get on the bike EVERY DAY FOR 30 MINUTES

## 2016-03-31 NOTE — Progress Notes (Signed)
Subjective:  Patient ID: Edwin Woodward, male    DOB: Sep 14, 1950  Age: 65 y.o. 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 Diagnoses of Vitamin D deficiency, Long-term use of high-risk medication, Prostate cancer screening, Hyperlipidemia LDL goal <70, HYPERTENSION, BENIGN, Gastroesophageal reflux disease without esophagitis, CKD (chronic kidney disease) stage 3, GFR 30-59 ml/min, Hyperlipidemia, and Obesity were also pertinent to this visit.  HPI Edwin Woodward presents for FOLLOW UP ON chronic conditions .  Since his last visit his neurologic condition has been clarified and he has improved significantly since his medications were changed and therapy was initiated..   No longer seeing Psych at [facility].  Sees Dr Werner Lean at [facility] The diagnosis was changed to fronto temporal  dementia.  His FTD now followed by [facility] managed with celexa and risperdone . Did not tolerate seroquel.  His apathy worsened on higher dose of aricept,  So now on 5 mg .  Living independently.  His 3 sons have been disappointing in their lack of involvement in his life and his estranged wife continues to provide the majority of emotional and financial support.  Harmon Pier  visits him every other day and manages his medication refills and pill boxes.  Has some initiative issues with daily exercise. Harmon Pier is applying for general guardianship since the sons have been lacking in support.   OSA on CPAP, using it for naps as well.   Last colonoscopy 2013.  Last EGD was done as well and there  Was no evidence of  Barrett's   Esophagus or dysplasia.  Taking protonix daily, no GERD symptoms.  Has not tried downgrading to H2 blocker    Needs nephrology labs per Dr Candiss Norse, and Dr Jacqlyn Larsen needs PSA   Outpatient Medications Prior to Visit  Medication Sig Dispense Refill  . alfuzosin (UROXATRAL) 10 MG 24 hr tablet Take 10 mg by mouth daily.       Marland Kitchen aspirin EC 81 MG tablet Take 1 tablet (81 mg total) by mouth daily. 90 tablet 3  . atorvastatin (LIPITOR) 40 MG tablet Take 40 mg alt with 20 mg daily.    Marland Kitchen buPROPion (WELLBUTRIN SR) 150 MG 12 hr tablet TAKE 1 TABLET BY MOUTH TWICE A DAY 60 tablet 6  . calcium carbonate 1250 MG capsule Take 1,250 mg by mouth 2 (two) times daily with a meal.    . clindamycin (CLEOCIN) 300 MG capsule 2 CAPSULES ONE HOUR BEFORE DENTAL PROCEDURE 2 capsule 2  . clopidogrel (PLAVIX) 75 MG tablet TAKE 1 TABLET (75 MG TOTAL) BY MOUTH DAILY. 90 tablet 3  . colchicine (COLCRYS) 0.6 MG tablet Take 1 tablet (0.6 mg total) by mouth daily. (Patient taking differently: Take 0.6 mg by mouth as needed. ) 30 tablet 0  . diclofenac sodium (VOLTAREN) 1 % GEL Apply 1 application topically as needed.    . fluticasone (FLONASE) 50 MCG/ACT nasal spray Place 2 sprays into both nostrils daily. 16 g 6  . MAGNESIUM PO Take by mouth 2 (two) times daily.    . nitroGLYCERIN (NITROSTAT) 0.4 MG SL tablet Place 1 tablet (0.4 mg total) under the tongue every 5 (five) minutes as needed. 90 tablet 3  . metoprolol tartrate (LOPRESSOR) 25 MG tablet TAKE 1 TABLET (25 MG TOTAL) BY MOUTH 2 (TWO) TIMES DAILY. (Patient taking differently: TAKE 1/2 TABLET (12.5 MG TOTAL) BY MOUTH 2 (TWO) TIMES DAILY.) 30 tablet 1  .  pantoprazole (PROTONIX) 40 MG tablet TAKE 1 TABLET (40 MG TOTAL) BY MOUTH DAILY. 90 tablet 0  . risperiDONE (RISPERDAL) 0.25 MG tablet Take by mouth.    . metoprolol tartrate (LOPRESSOR) 25 MG tablet TAKE 1 TABLET (25 MG TOTAL) BY MOUTH 2 (TWO) TIMES DAILY. (Patient not taking: Reported on 03/31/2016) 180 tablet 1  . PARoxetine (PAXIL-CR) 25 MG 24 hr tablet Take 1 tablet (25 mg total) by mouth every morning. 30 tablet 6  . QUEtiapine (SEROQUEL) 25 MG tablet Take 2 tablets (50 mg total) by mouth at bedtime. (Patient not taking: Reported on 10/03/2015) 60 tablet 6   No facility-administered medications prior to visit.     Review of  Systems;  Patient denies headache, fevers, malaise, unintentional weight loss, skin rash, eye pain, sinus congestion and sinus pain, sore throat, dysphagia,  hemoptysis , cough, dyspnea, wheezing, chest pain, palpitations, orthopnea, edema, abdominal pain, nausea, melena, diarrhea, constipation, flank pain, dysuria, hematuria, urinary  Frequency, nocturia, numbness, tingling, seizures,  Focal weakness, Loss of consciousness,  Tremor, insomnia, depression, anxiety, and suicidal ideation.      Objective:  BP 110/64 (BP Location: Right Arm, Patient Position: Sitting, Cuff Size: Normal)   Pulse (!) 56   Temp 97.7 F (36.5 C) (Oral)   Resp 16   Wt 179 lb (81.2 kg)   BMI 31.71 kg/m   BP Readings from Last 3 Encounters:  03/31/16 110/64  10/03/15 126/84  08/15/15 128/60    Wt Readings from Last 3 Encounters:  03/31/16 179 lb (81.2 kg)  10/03/15 184 lb 12.8 oz (83.8 kg)  08/15/15 184 lb (83.5 kg)    General appearance: alert, cooperative and appears stated age Ears: normal TM's and external ear canals both ears Throat: lips, mucosa, and tongue normal; teeth and gums normal Neck: no adenopathy, no carotid bruit, supple, symmetrical, trachea midline and thyroid not enlarged, symmetric, no tenderness/mass/nodules Back: symmetric, no curvature. ROM normal. No CVA tenderness. Lungs: clear to auscultation bilaterally Heart: regular rate and rhythm, S1, S2 normal, no murmur, click, rub or gallop Abdomen: soft, non-tender; bowel sounds normal; no masses,  no organomegaly Pulses: 2+ and symmetric Skin: Skin color, texture, turgor normal. No rashes or lesions Lymph nodes: Cervical, supraclavicular, and axillary nodes normal.  No results found for: HGBA1C  Lab Results  Component Value Date   CREATININE 1.32 (H) 11/01/2015   CREATININE 1.49 12/07/2014   CREATININE 1.70 (H) 11/24/2014    Lab Results  Component Value Date   WBC 37.6 (H) 09/05/2014   HGB 10.2 (L) 09/05/2014   HCT 32.7  (L) 09/05/2014   PLT 489 (H) 09/05/2014   GLUCOSE 106 (H) 11/01/2015   CHOL 126 11/01/2015   TRIG 98 11/01/2015   HDL 36 (L) 11/01/2015   LDLCALC 70 11/01/2015   ALT 18 11/01/2015   AST 22 11/01/2015   NA 137 11/01/2015   K 4.4 11/01/2015   CL 103 11/01/2015   CREATININE 1.32 (H) 11/01/2015   BUN 25 (H) 11/01/2015   CO2 28 11/01/2015   TSH 2.34 08/31/2014   INR 2.2 (H) 05/18/2009    No results found.  Assessment & Plan:   Problem List Items Addressed This Visit    Hyperlipidemia    Goal is LDL , 70 for history of CAD.  Tolerating Lipitor,  nongasting (3 hr post prandial) lipids ordered.  Lab Results  Component Value Date   CHOL 126 11/01/2015   HDL 36 (L) 11/01/2015   LDLCALC 70 11/01/2015  TRIG 98 11/01/2015   CHOLHDL 3.5 11/01/2015   Lab Results  Component Value Date   ALT 18 11/01/2015   AST 22 11/01/2015   ALKPHOS 67 11/01/2015   BILITOT 0.8 11/01/2015         Relevant Medications   metoprolol tartrate (LOPRESSOR) 25 MG tablet   HYPERTENSION, BENIGN    Well controlled on current regimen. Renal function last checked in Feb and is still pending ,  no changes today.  Lab Results  Component Value Date   CREATININE 1.32 (H) 11/01/2015         Relevant Medications   metoprolol tartrate (LOPRESSOR) 25 MG tablet   Obesity    I have addressed  BMI and recommended a low glycemic index diet utilizing smaller more frequent meals to increase metabolism.  I have also recommended that patient start exercising with a goal of 30 minutes of aerobic exercise a minimum of 5 days per week.       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  significant improvement in cognition and endurance/strength  with medication changes and PT. He is now living independently with close supervision by estranged wife Harmon Pier, who is applying for general guardianship.  No changes today       GERD  (gastroesophageal reflux disease)    Prior EGD reviewed,  Symptoms have been well controlled on PPI for 4 years.  Discussed current controversy regarding prolonged use of PPI in patients without documented Barretts esophagus. Suggested trial of pepcid 20 mg daily.  If GERD symptoms return, resume PPI       Relevant Medications   pantoprazole (PROTONIX) 40 MG tablet   CKD (chronic kidney disease) stage 3, GFR 30-59 ml/min    Managed with avoidance of NSAIDs and control of BP,  Labs needed and forwarding to Nephrology requested       Other Visit Diagnoses    Vitamin D deficiency       Relevant Orders   VITAMIN D 25 Hydroxy (Vit-D Deficiency, Fractures)   Long-term use of high-risk medication       Relevant Orders   Vitamin B12   Magnesium   Comprehensive metabolic panel   Prostate cancer screening       Relevant Orders   PSA   Hyperlipidemia LDL goal <70       Relevant Medications   metoprolol tartrate (LOPRESSOR) 25 MG tablet   Other Relevant Orders   Lipid panel      I have discontinued Mr. Duffy PARoxetine, QUEtiapine, and metoprolol tartrate. I have also changed his metoprolol tartrate. Additionally, I am having him maintain his alfuzosin, nitroGLYCERIN, atorvastatin, diclofenac sodium, calcium carbonate, colchicine, buPROPion, MAGNESIUM PO, aspirin EC, risperiDONE, fluticasone, clindamycin, clopidogrel, citalopram, donepezil, and pantoprazole.  Meds ordered this encounter  Medications  . citalopram (CELEXA) 20 MG tablet    Sig: Take 30 mg by mouth daily.  Marland Kitchen donepezil (ARICEPT) 10 MG tablet    Sig: Take 10 mg by mouth at bedtime.  . metoprolol tartrate (LOPRESSOR) 25 MG tablet    Sig: Take 0.5 tablets (12.5 mg total) by mouth 2 (two) times daily.    Dispense:  30 tablet    Refill:  6    Needs an appointment  . pantoprazole (PROTONIX) 40 MG tablet    Sig: TAKE 1 TABLET (40 MG TOTAL) BY MOUTH DAILY.    Dispense:  30 tablet    Refill:  5    Medications  Discontinued During This Encounter  Medication Reason  . metoprolol tartrate (LOPRESSOR) 25 MG tablet Duplicate  . PARoxetine (PAXIL-CR) 25 MG 24 hr tablet Discontinued by provider  . QUEtiapine (SEROQUEL) 25 MG tablet Discontinued by provider  . metoprolol tartrate (LOPRESSOR) 25 MG tablet Reorder  . pantoprazole (PROTONIX) 40 MG tablet Reorder    Follow-up: No Follow-up on file.   [name], MD

## 2016-04-01 ENCOUNTER — Encounter: Payer: Self-pay | Admitting: Internal Medicine

## 2016-04-01 DIAGNOSIS — G1220 Motor neuron disease, unspecified: Principal | ICD-10-CM

## 2016-04-01 DIAGNOSIS — K219 Gastro-esophageal reflux disease without esophagitis: Secondary | ICD-10-CM | POA: Insufficient documentation

## 2016-04-01 DIAGNOSIS — N182 Chronic kidney disease, stage 2 (mild): Secondary | ICD-10-CM | POA: Insufficient documentation

## 2016-04-01 DIAGNOSIS — G3109 Other frontotemporal dementia: Principal | ICD-10-CM

## 2016-04-01 DIAGNOSIS — F0280 Dementia in other diseases classified elsewhere without behavioral disturbance: Secondary | ICD-10-CM | POA: Insufficient documentation

## 2016-04-01 LAB — COMPREHENSIVE METABOLIC PANEL
ALT: 18 U/L (ref 0–53)
AST: 19 U/L (ref 0–37)
Albumin: 4.4 g/dL (ref 3.5–5.2)
Alkaline Phosphatase: 76 U/L (ref 39–117)
BILIRUBIN TOTAL: 0.6 mg/dL (ref 0.2–1.2)
BUN: 30 mg/dL — AB (ref 6–23)
CO2: 29 meq/L (ref 19–32)
CREATININE: 1.43 mg/dL (ref 0.40–1.50)
Calcium: 9.6 mg/dL (ref 8.4–10.5)
Chloride: 105 mEq/L (ref 96–112)
GFR: 52.61 mL/min — AB (ref >60.00)
GLUCOSE: 96 mg/dL (ref 70–99)
Potassium: 4.8 mEq/L (ref 3.5–5.1)
SODIUM: 139 meq/L (ref 135–145)
Total Protein: 6.6 g/dL (ref 6.0–8.3)

## 2016-04-01 LAB — LIPID PANEL
CHOL/HDL RATIO: 4
Cholesterol: 126 mg/dL (ref 0–200)
HDL: 34.7 mg/dL — ABNORMAL LOW (ref >39.00)
LDL CALC: 60 mg/dL (ref 0–99)
NonHDL: 91.37
TRIGLYCERIDES: 159 mg/dL — AB (ref 0.0–149.0)
VLDL: 31.8 mg/dL (ref 0.0–40.0)

## 2016-04-01 LAB — PSA: PSA: 1.37 ng/mL (ref 0.10–4.00)

## 2016-04-01 LAB — VITAMIN B12: Vitamin B-12: 498 pg/mL (ref 211–911)

## 2016-04-01 LAB — MAGNESIUM: Magnesium: 2.2 mg/dL (ref 1.5–2.5)

## 2016-04-01 LAB — VITAMIN D 25 HYDROXY (VIT D DEFICIENCY, FRACTURES): VITD: 51.31 ng/mL (ref 30.00–100.00)

## 2016-04-01 NOTE — Assessment & Plan Note (Signed)
Well controlled on current regimen. Renal function last checked in Feb and is still pending ,  no changes today.  Lab Results  Component Value Date   CREATININE 1.32 (H) 11/01/2015

## 2016-04-01 NOTE — Assessment & Plan Note (Signed)
Managed with avoidance of NSAIDs and control of BP,  Labs needed and forwarding to Nephrology requested

## 2016-04-01 NOTE — Assessment & Plan Note (Addendum)
Diagnosed by the [facility] clinic.  significant improvement in cognition and endurance/strength  with medication changes and PT. He is now living independently with close supervision by estranged wife Edwin Woodward, who is applying for general guardianship.  No changes today

## 2016-04-01 NOTE — Assessment & Plan Note (Signed)
Goal is LDL , 70 for history of CAD.  Tolerating Lipitor,  nongasting (3 hr post prandial) lipids ordered.  Lab Results  Component Value Date   CHOL 126 11/01/2015   HDL 36 (L) 11/01/2015   LDLCALC 70 11/01/2015   TRIG 98 11/01/2015   CHOLHDL 3.5 11/01/2015   Lab Results  Component Value Date   ALT 18 11/01/2015   AST 22 11/01/2015   ALKPHOS 67 11/01/2015   BILITOT 0.8 11/01/2015

## 2016-04-01 NOTE — Assessment & Plan Note (Signed)
I have addressed  BMI and recommended a low glycemic index diet utilizing smaller more frequent meals to increase metabolism.  I have also recommended that patient start exercising with a goal of 30 minutes of aerobic exercise a minimum of 5 days per week.  

## 2016-04-01 NOTE — Assessment & Plan Note (Addendum)
Prior EGD reviewed,  Symptoms have been well controlled on PPI for 4 years.  Discussed current controversy regarding prolonged use of PPI in patients without documented Barretts esophagus. Suggested trial of pepcid 20 mg daily.  If GERD symptoms return, resume PPI

## 2016-04-03 ENCOUNTER — Telehealth: Payer: Self-pay | Admitting: *Deleted

## 2016-04-03 NOTE — Telephone Encounter (Signed)
Patient's wife  requested lab results Pt contact [phone]

## 2016-04-04 NOTE — Telephone Encounter (Signed)
Pt wife called and was returning your call. Wife states a message can be left on vm it is secured.   Call wife @ [phone]. Thank you!

## 2016-04-04 NOTE — Telephone Encounter (Signed)
Attempted to reach patient and wife, left a VM. thanks

## 2016-04-04 NOTE — Telephone Encounter (Signed)
Spoke with the patient, reviewed results and verbalized  Understanding.   Please document on result note, thanks

## 2016-04-04 NOTE — Telephone Encounter (Signed)
Seems as if message was accidentally deleted. See lab results note from 03/31/2016. Renaldo Fiddler, CMA

## 2016-04-04 NOTE — Progress Notes (Signed)
Pt advised by Lavella Lemons. Renaldo Fiddler, CMA

## 2016-04-16 ENCOUNTER — Other Ambulatory Visit: Payer: Self-pay | Admitting: Cardiovascular Disease

## 2016-05-23 ENCOUNTER — Telehealth: Payer: Self-pay | Admitting: Internal Medicine

## 2016-05-23 MED ORDER — ALFUZOSIN HCL ER 10 MG PO TB24: 10.0000 mg | ORAL_TABLET | Freq: Every day | ORAL | 1 refills | Status: DC

## 2016-05-23 NOTE — Telephone Encounter (Signed)
Edwin Woodward is requesting to have Uroxatral refilled by PCP. You have never filled this. Urology will not refill medication till [facility] when patient has appointment.  Looks like Cardiology has refilled it before. Patients wife stated that she was told to have PCP refill medication. Please advise.

## 2016-05-23 NOTE — Telephone Encounter (Signed)
Pt needs refill on alfuzosin (UROXATRAL) 10 MG 24 hr tablet.. Please advise

## 2016-05-23 NOTE — Telephone Encounter (Signed)
No worries.  90 days rx sent

## 2016-07-10 ENCOUNTER — Ambulatory Visit (INDEPENDENT_AMBULATORY_CARE_PROVIDER_SITE_OTHER): Payer: Medicare Other

## 2016-07-10 DIAGNOSIS — Z23 Encounter for immunization: Secondary | ICD-10-CM

## 2016-08-07 DIAGNOSIS — F322 Major depressive disorder, single episode, severe without psychotic features: Secondary | ICD-10-CM | POA: Insufficient documentation

## 2016-09-04 ENCOUNTER — Other Ambulatory Visit: Payer: Self-pay | Admitting: Internal Medicine

## 2016-09-04 NOTE — Telephone Encounter (Signed)
Mag-ox 400 #60 R 0 Must come in for appt and labs for further refills

## 2016-10-26 ENCOUNTER — Other Ambulatory Visit: Payer: Self-pay | Admitting: Internal Medicine

## 2016-11-14 ENCOUNTER — Encounter: Payer: Self-pay | Admitting: Internal Medicine

## 2016-11-14 ENCOUNTER — Ambulatory Visit (INDEPENDENT_AMBULATORY_CARE_PROVIDER_SITE_OTHER): Payer: Medicare Other | Admitting: Internal Medicine

## 2016-11-14 VITALS — BP 106/72 | HR 54 | Resp 17 | Ht 63.0 in | Wt 183.0 lb

## 2016-11-14 DIAGNOSIS — E7800 Pure hypercholesterolemia, unspecified: Secondary | ICD-10-CM

## 2016-11-14 DIAGNOSIS — F0280 Dementia in other diseases classified elsewhere without behavioral disturbance: Secondary | ICD-10-CM

## 2016-11-14 DIAGNOSIS — G1220 Motor neuron disease, unspecified: Secondary | ICD-10-CM

## 2016-11-14 DIAGNOSIS — G3109 Other frontotemporal dementia: Secondary | ICD-10-CM | POA: Diagnosis not present

## 2016-11-14 DIAGNOSIS — K521 Toxic gastroenteritis and colitis: Secondary | ICD-10-CM

## 2016-11-14 DIAGNOSIS — Z6831 Body mass index (BMI) 31.0-31.9, adult: Secondary | ICD-10-CM | POA: Diagnosis not present

## 2016-11-14 DIAGNOSIS — E6609 Other obesity due to excess calories: Secondary | ICD-10-CM | POA: Diagnosis not present

## 2016-11-14 DIAGNOSIS — I10 Essential (primary) hypertension: Secondary | ICD-10-CM | POA: Diagnosis not present

## 2016-11-14 DIAGNOSIS — R197 Diarrhea, unspecified: Secondary | ICD-10-CM

## 2016-11-14 DIAGNOSIS — A09 Infectious gastroenteritis and colitis, unspecified: Secondary | ICD-10-CM

## 2016-11-14 LAB — COMPREHENSIVE METABOLIC PANEL
ALK PHOS: 82 U/L (ref 40–115)
ALT: 22 U/L (ref 9–46)
AST: 25 U/L (ref 10–35)
Albumin: 4.1 g/dL (ref 3.6–5.1)
BUN: 19 mg/dL (ref 7–25)
CO2: 26 mmol/L (ref 20–31)
Calcium: 9.2 mg/dL (ref 8.6–10.3)
Chloride: 106 mmol/L (ref 98–110)
Creat: 1.21 mg/dL (ref 0.70–1.25)
Glucose, Bld: 87 mg/dL (ref 65–99)
POTASSIUM: 4.3 mmol/L (ref 3.5–5.3)
Sodium: 139 mmol/L (ref 135–146)
Total Bilirubin: 0.4 mg/dL (ref 0.2–1.2)
Total Protein: 6.5 g/dL (ref 6.1–8.1)

## 2016-11-14 LAB — LIPID PANEL
CHOL/HDL RATIO: 3 ratio (ref <5.0)
CHOLESTEROL: 104 mg/dL (ref <200)
HDL: 35 mg/dL — ABNORMAL LOW (ref >40)
LDL Cholesterol: 42 mg/dL (ref <100)
TRIGLYCERIDES: 134 mg/dL (ref <150)
VLDL: 27 mg/dL (ref <30)

## 2016-11-14 NOTE — Patient Instructions (Signed)
I agree with stopping the Aricept  to see if the diarreha resolves,  (48 hours)  Stop the immodium  Unless the diarrhea returns   If it returns ,  Submit stool for testing.  Prior to GI referral

## 2016-11-14 NOTE — Progress Notes (Signed)
Pre visit review using our clinic review tool, if applicable. No additional management support is needed unless otherwise documented below in the visit note. 

## 2016-11-14 NOTE — Progress Notes (Signed)
Subjective:  Patient ID: Edwin Woodward, male    DOB: 11-28-1950  Age: 66 y.o. MRN: [id number]  CC: The primary encounter diagnosis was Pure hypercholesterolemia. 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  HPI Edwin Woodward presents for follow up on multiple chronic issues, including early FTD dementia.  He is accompanied by his estranged wife who, along with her boyfriend of > 5 years,  Have resigned themselves to providing care and supervision of patient as long as he is capable of living independently.    Has been having loose stools daily for the past several months presumedly due to use of aricept for dementia management.  Tried splitting dose,  Treating with immodium,  No good solution.  Using immodium is resulting In problematic  constipation, so he has been miserable,   Having up to 3 loose stools daily.  was advised by neurologist in January to stop the aricept and start an  Rivastigmine  but has not done so .  Also taking risperdal and celexa for agitated depression   Denies pain.  Having trouble sleeping due to diarrhea. Has OSA but chooses not to wear CPAP   Walking at the mall 4 times daily 1.5 miles daily,   sometimes sciatica bothers him and he has to stop early    Outpatient Medications Prior to Visit  Medication Sig Dispense Refill  . alfuzosin (UROXATRAL) 10 MG 24 hr tablet Take 1 tablet (10 mg total) by mouth daily. 90 tablet 1  . aspirin EC 81 MG tablet Take 1 tablet (81 mg total) by mouth daily. 90 tablet 3  . atorvastatin (LIPITOR) 40 MG tablet Take 40 mg alt with 20 mg daily.    Marland Kitchen buPROPion (WELLBUTRIN SR) 150 MG 12 hr tablet TAKE 1 TABLET BY MOUTH TWICE A DAY 60 tablet 6  . calcium  carbonate 1250 MG capsule Take 1,250 mg by mouth 2 (two) times daily with a meal.    . citalopram (CELEXA) 20 MG tablet Take 30 mg by mouth daily.    . clindamycin (CLEOCIN) 300 MG capsule 2 CAPSULES ONE HOUR BEFORE DENTAL PROCEDURE 2 capsule 2  . clopidogrel (PLAVIX) 75 MG tablet TAKE 1 TABLET (75 MG TOTAL) BY MOUTH DAILY. 90 tablet 3  . colchicine (COLCRYS) 0.6 MG tablet Take 1 tablet (0.6 mg total) by mouth daily. (Patient taking differently: Take 0.6 mg by mouth as needed. ) 30 tablet 0  . diclofenac sodium (VOLTAREN) 1 % GEL Apply 1 application topically as needed.    . donepezil (ARICEPT) 10 MG tablet Take 10 mg by mouth at bedtime. Pt takes 5mg  in the morning and 5mg  at night    . fluticasone (FLONASE) 50 MCG/ACT nasal spray Place 2 sprays into both nostrils daily. 16 g 6  . magnesium oxide (MAG-OX) 400 (241.3 Mg) MG tablet TAKE 1 CAPSULE (400 MG TOTAL) BY MOUTH 2 (TWO) TIMES DAILY AFTER A MEAL. 60 tablet 0  . metoprolol tartrate (LOPRESSOR) 25 MG tablet TAKE 0.5 TABLETS (12.5 MG TOTAL) BY MOUTH 2 (TWO) TIMES DAILY. 30 tablet 0  . nitroGLYCERIN (NITROSTAT) 0.4 MG SL tablet Place 1 tablet (0.4 mg total) under the tongue every 5 (five) minutes as needed. 90 tablet 3  .  risperiDONE (RISPERDAL) 0.25 MG tablet Take 0.25 mg by mouth 2 (two) times daily.     Marland Kitchen atorvastatin (LIPITOR) 40 MG tablet TAKE 1 TABLET (40 MG TOTAL) BY MOUTH DAILY. 90 tablet 2  . MAGNESIUM PO Take by mouth 2 (two) times daily.    . pantoprazole (PROTONIX) 40 MG tablet TAKE 1 TABLET (40 MG TOTAL) BY MOUTH DAILY. (Patient not taking: Reported on 11/14/2016) 30 tablet 5   No facility-administered medications prior to visit.     Review of Systems;  Patient denies headache, fevers, malaise, unintentional weight loss, skin rash, eye pain, sinus congestion and sinus pain, sore throat, dysphagia,  hemoptysis , cough, dyspnea, wheezing, chest pain, palpitations, orthopnea, edema, abdominal pain, nausea, melena,  flank pain, dysuria,  hematuria, urinary  Frequency, nocturia, numbness, tingling, seizures,  Focal weakness, Loss of consciousness,  Tremor, insomnia, depression, anxiety, and suicidal ideation.      Objective:  BP 106/72 (BP Location: Left Arm, Patient Position: Sitting, Cuff Size: Normal)   Pulse (!) 54   Resp 17   Ht 5\' 3"  (1.6 m)   Wt 183 lb (83 kg)   SpO2 95%   BMI 32.42 kg/m   BP Readings from Last 3 Encounters:  11/14/16 106/72  03/31/16 110/64  10/03/15 126/84    Wt Readings from Last 3 Encounters:  11/14/16 183 lb (83 kg)  03/31/16 179 lb (81.2 kg)  10/03/15 184 lb 12.8 oz (83.8 kg)    General appearance: alert, cooperative and appears stated age Neck: no adenopathy, no carotid bruit, supple, symmetrical, trachea midline and thyroid not enlarged, symmetric, no tenderness/mass/nodules Back: symmetric, no curvature. ROM normal. No CVA tenderness. Lungs: clear to auscultation bilaterally Heart: regular rate and rhythm, S1, S2 normal, no murmur, click, rub or gallop Abdomen: soft, non-tender; bowel sounds normal; no masses,  no organomegaly Pulses: 2+ and symmetric Skin: Skin color, texture, turgor normal. No rashes or lesions Lymph nodes: Cervical, supraclavicular, and axillary nodes normal.  No results found for: HGBA1C  Lab Results  Component Value Date   CREATININE 1.21 11/14/2016   CREATININE 1.43 03/31/2016   CREATININE 1.32 (H) 11/01/2015    Lab Results  Component Value Date   WBC 37.6 (H) 09/05/2014   HGB 10.2 (L) 09/05/2014   HCT 32.7 (L) 09/05/2014   PLT 489 (H) 09/05/2014   GLUCOSE 87 11/14/2016   CHOL 104 11/14/2016   TRIG 134 11/14/2016   HDL 35 (L) 11/14/2016   LDLCALC 42 11/14/2016   ALT 22 11/14/2016   AST 25 11/14/2016   NA 139 11/14/2016   K 4.3 11/14/2016   CL 106 11/14/2016   CREATININE 1.21 11/14/2016   BUN 19 11/14/2016   CO2 26 11/14/2016   TSH 2.34 08/31/2014   PSA 1.37 03/31/2016   INR 2.2 (H) 05/18/2009    No results found.  Assessment  & Plan:   Problem List Items Addressed This Visit    Diarrhea due to drug    Presumed secondary to aricept.  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  Tolerating Lipitor, liver enzymes normal,  No changes today   Lab Results  Component Value  Date   CHOL 104 11/14/2016   HDL 35 (L) 11/14/2016   LDLCALC 42 11/14/2016   TRIG 134 11/14/2016   CHOLHDL 3.0 11/14/2016   Lab Results  Component Value Date   ALT 22 11/14/2016   AST 25 11/14/2016   ALKPHOS 82 11/14/2016   BILITOT 0.4 11/14/2016         Relevant Orders   Lipid panel (Completed)   HYPERTENSION, BENIGN   Relevant Orders   Comprehensive metabolic panel (Completed)   Obesity    Complicated by dementia, depression and sciatica.  Weight gain of 4 lbs since last visit. I have addressed  BMI and recommended wt loss of 10% of body weight over the next 6 months using a low fat, low starch, high protein  fruit/vegetable based Mediterranean diet and 30 minutes of aerobic exercise a minimum of 5 days per week. Fasting glucose is normal.        Other Visit Diagnoses    Diarrhea of presumed infectious origin       Relevant Orders   Gastrointestinal Pathogen Panel PCR      I have discontinued Mr. Zee MAGNESIUM PO and pantoprazole. I am also having him maintain his nitroGLYCERIN, atorvastatin, diclofenac sodium, calcium carbonate, colchicine, buPROPion, aspirin EC, risperiDONE, fluticasone, clindamycin, clopidogrel, citalopram, donepezil, alfuzosin, magnesium oxide, metoprolol tartrate, and nystatin.  Meds ordered this encounter  Medications  . nystatin (MYCOSTATIN/NYSTOP) powder    Sig: Apply topically.    Medications Discontinued During  This Encounter  Medication Reason  . atorvastatin (LIPITOR) 40 MG tablet Error  . MAGNESIUM PO Patient has not taken in last 30 days  . pantoprazole (PROTONIX) 40 MG tablet Patient has not taken in last 30 days    Follow-up: No Follow-up on file.   [name], MD

## 2016-11-16 DIAGNOSIS — K521 Toxic gastroenteritis and colitis: Secondary | ICD-10-CM | POA: Insufficient documentation

## 2016-11-16 NOTE — Assessment & Plan Note (Signed)
Advised to carry out neurology plan with dc aricept and start Exelon

## 2016-11-16 NOTE — Assessment & Plan Note (Signed)
Goal is LDL , 70 for history of CAD.  Tolerating Lipitor, liver enzymes normal,  No changes today   Lab Results  Component Value Date   CHOL 104 11/14/2016   HDL 35 (L) 11/14/2016   LDLCALC 42 11/14/2016   TRIG 134 11/14/2016   CHOLHDL 3.0 11/14/2016   Lab Results  Component Value Date   ALT 22 11/14/2016   AST 25 11/14/2016   ALKPHOS 82 11/14/2016   BILITOT 0.4 11/14/2016

## 2016-11-16 NOTE — Assessment & Plan Note (Signed)
Complicated by dementia, depression and sciatica.  Weight gain of 4 lbs since last visit. I have addressed  BMI and recommended wt loss of 10% of body weight over the next 6 months using a low fat, low starch, high protein  fruit/vegetable based Mediterranean diet and 30 minutes of aerobic exercise a minimum of 5 days per week. Fasting glucose is normal.

## 2016-11-16 NOTE — Assessment & Plan Note (Signed)
Presumed secondary to aricept.  If diarrhea does not resolve with medication suspension,  Will pursue stool studies and GI referral

## 2016-11-27 ENCOUNTER — Telehealth: Payer: Self-pay | Admitting: Internal Medicine

## 2016-11-27 NOTE — Telephone Encounter (Signed)
My apologies.  Your cholesterol, liver and kidney function are normal.  You do not need any medication changes.  I never received any stool for the stool studies,  So I assume the diarrhea has resolved.   Please plan to repeat the labs in 6 months.

## 2016-11-27 NOTE — Telephone Encounter (Signed)
Pt's wife-Eva- called regarding Brianna's lab work that was done two weeks ago. She states that they have not heard anything regarding the results. Please cb [phone]

## 2016-11-27 NOTE — Telephone Encounter (Signed)
Spoke with pt's wife and informed her of the pt's lab results. She stated that the diarrhea has not resolved but that she has contacted the doctor at [facility] that prescribed the aricept and the pt is going to see him to change his medication since she seems to think that is what is causing the diarrhea. She stated that if that does not help with the diarrhea then she would get another stool sample kit from [place].

## 2016-11-27 NOTE — Telephone Encounter (Signed)
Doesn't look like labs have been reviewed. Please advise.

## 2016-11-28 ENCOUNTER — Other Ambulatory Visit: Payer: Self-pay | Admitting: Internal Medicine

## 2016-12-08 NOTE — Progress Notes (Signed)
Cardiology Office Note  Date:  12/09/2016   ID:  Edwin Woodward, DOB 01/28/1951, MRN [id number]  PCP:  [name], MD   Chief Complaint  Patient presents with  . other    LS 2016 no complaints today . Meds reviewed verbally with pt.    HPI:  [name] is a very pleasant 66 year-old gentleman who has a history of coronary artery disease,  bare-metal stents placed to his proximal RCA and LAD in April 2009, total knee replacements bilaterally,  hypertension,  hyperlipidemia,  anxiety ADHD  who presents for routine followup of his coronary artery disease  His ex-wife presents with him today. diagnosis of dementia, often tangential when he speaks Symptoms are better after recent medication changes She reports that 5 medications were stopped, 4 were started On the new regiment he is more responsive, less spacey She has to drive him everywhere as he is dangerous when he is driving per his ex-wife Goes walking now every day in the mall  He does not like to drink much fluids, On previous visit with primary care he was pushed to drinking lots of fluids with systolic pressure 295. Stable BMP at that time General he does not drink fluids and today systolic pressure is low in the 80s even on my recheck He does have some orthostasis symptoms, no past out spells  He is taking metoprolol 12.5 mg twice a day  EKG on today's visit shows normal sinus rhythm with rate 49 bpm, right bundle branch block, nonspecific ST and T wave abnormality no change compared to prior EKGs    PMH:   has a past medical history of CAD (coronary artery disease); Cardiovascular stress test abnormal; Hyperlipidemia; Hypertension; Presence of bare metal stent in right coronary artery; and Trigger finger.  PSH:    Past Surgical History:  Procedure Laterality Date  . APPENDECTOMY    . APPENDECTOMY    . CARPAL TUNNEL RELEASE    . CATARACT EXTRACTION     right  . CORONARY ANGIOPLASTY  12/2007  .  CORONARY ARTERY BYPASS GRAFT     2 vessel  . JOINT REPLACEMENT     total knee  . REPLACEMENT TOTAL KNEE    . TONSILLECTOMY    . VASECTOMY      Current Outpatient Prescriptions  Medication Sig Dispense Refill  . alfuzosin (UROXATRAL) 10 MG 24 hr tablet Take 1 tablet (10 mg total) by mouth daily. 90 tablet 1  . aspirin EC 81 MG tablet Take 1 tablet (81 mg total) by mouth daily. 90 tablet 3  . atorvastatin (LIPITOR) 40 MG tablet Take 1 tablet (40 mg total) by mouth daily at 6 PM. Take 40 mg alt with 20 mg daily. 90 tablet 3  . buPROPion (WELLBUTRIN SR) 150 MG 12 hr tablet TAKE 1 TABLET BY MOUTH TWICE A DAY 60 tablet 6  . calcium carbonate 1250 MG capsule Take 1,250 mg by mouth 2 (two) times daily with a meal.    . citalopram (CELEXA) 20 MG tablet Take 30 mg by mouth daily.    . clindamycin (CLEOCIN) 300 MG capsule 2 CAPSULES ONE HOUR BEFORE DENTAL PROCEDURE 2 capsule 2  . clopidogrel (PLAVIX) 75 MG tablet TAKE 1 TABLET (75 MG TOTAL) BY MOUTH DAILY. 90 tablet 3  . diclofenac sodium (VOLTAREN) 1 % GEL Apply 1 application topically as needed.    . donepezil (ARICEPT) 10 MG tablet Take 10 mg by mouth at bedtime. Pt takes 5mg  in  the morning and 5mg  at night    . fluticasone (FLONASE) 50 MCG/ACT nasal spray Place 2 sprays into both nostrils daily. 16 g 6  . magnesium oxide (MAG-OX) 400 (241.3 Mg) MG tablet TAKE 1 CAPSULE (400 MG TOTAL) BY MOUTH 2 (TWO) TIMES DAILY AFTER A MEAL. 60 tablet 0  . nitroGLYCERIN (NITROSTAT) 0.4 MG SL tablet Place 1 tablet (0.4 mg total) under the tongue every 5 (five) minutes as needed. 90 tablet 3  . nystatin (MYCOSTATIN/NYSTOP) powder Apply topically.    . risperiDONE (RISPERDAL) 0.25 MG tablet Take 0.25 mg by mouth 2 (two) times daily.      No current facility-administered medications for this visit.      Allergies:   Biaxin [clarithromycin]; Clarithromycin; Codeine; Penicillins; Promethazine; and Iodinated diagnostic agents   Social History:  The patient   reports that he has quit smoking. He has never used smokeless tobacco. He reports that he does not drink alcohol or use drugs.   Family History:   family history includes Diabetes in his mother; Heart disease in his father; Hypertension in his mother; Thalassemia in his mother.    Review of Systems: Review of Systems  Constitutional: Negative.   Respiratory: Negative.   Cardiovascular: Negative.   Gastrointestinal: Negative.   Musculoskeletal: Negative.   Neurological: Negative.   Psychiatric/Behavioral: Positive for memory loss.  All other systems reviewed and are negative.    PHYSICAL EXAM: VS:  BP (!) 88/68 (BP Location: Left Arm, Patient Position: Sitting, Cuff Size: Normal)   Pulse (!) 49   Ht 5\' 3"  (1.6 m)   Wt 182 lb (82.6 kg)   BMI 32.24 kg/m  , BMI Body mass index is 32.24 kg/m. GEN: Well nourished, well developed, in no acute distress  HEENT: normal  Neck: no JVD, carotid bruits, or masses Cardiac: Regular rhythm, bradycardia; no murmurs, rubs, or gallops,no edema  Respiratory:  clear to auscultation bilaterally, normal work of breathing GI: soft, nontender, nondistended, + BS MS: no deformity or atrophy  Skin: warm and dry, no rash Neuro:  Strength and sensation are intact Psych: euthymic mood, full affect    Recent Labs: 03/31/2016: Magnesium 2.2 11/14/2016: ALT 22; BUN 19; Creat 1.21; Potassium 4.3; Sodium 139    Lipid Panel Lab Results  Component Value Date   CHOL 104 11/14/2016   HDL 35 (L) 11/14/2016   LDLCALC 42 11/14/2016   TRIG 134 11/14/2016      Wt Readings from Last 3 Encounters:  12/09/16 182 lb (82.6 kg)  11/14/16 183 lb (83 kg)  03/31/16 179 lb (81.2 kg)       ASSESSMENT AND PLAN:  Pure hypercholesterolemia - Plan: EKG 12-Lead, Testosterone Cholesterol is at goal on the current lipid regimen. No changes to the medications were made.  Hypotension/orthostasis We will hold his metoprolol Encouraged him to dramatically increase  his fluids Do not avoid salt (he does not like to eat salt)  Atherosclerosis of native coronary artery without angina pectoris, unspecified whether native or transplanted heart - Plan: EKG 12-Lead, Testosterone Currently with no symptoms of angina. No further workup at this time. Continue current medication regimen.  Class 1 obesity due to excess calories with serious comorbidity and body mass index (BMI) of 31.0 to 31.9 in adult - Plan: EKG 12-Lead, Testosterone Walking daily, slowly losing weight. Overall doing well  Bradycardia Recommended he stop metoprolol  GAD (generalized anxiety disorder) - Plan: EKG 12-Lead, Testosterone  CKD (chronic kidney disease) stage 3, GFR 30-59 ml/min -  Plan: EKG 12-Lead, Testosterone Encouraged him to drink fluids. Hypotensive on today's visit Improvement in numbers several weeks ago after drinking heavy amounts of fluids  Memory loss Recent medication changes with reported improvement of his symptoms Still not driving, requires assistance from ex-wife,  friends and family  Disposition:   F/U  6 months   Orders Placed This Encounter  Procedures  . Testosterone  . EKG 12-Lead     Signed, Esmond Plants, M.D., Ph.D. 12/09/2016  [place], [place]

## 2016-12-09 ENCOUNTER — Encounter: Payer: Self-pay | Admitting: Cardiovascular Disease

## 2016-12-09 ENCOUNTER — Ambulatory Visit (INDEPENDENT_AMBULATORY_CARE_PROVIDER_SITE_OTHER): Payer: Medicare Other | Admitting: Cardiovascular Disease

## 2016-12-09 VITALS — BP 88/68 | HR 49 | Ht 63.0 in | Wt 182.0 lb

## 2016-12-09 DIAGNOSIS — Z9989 Dependence on other enabling machines and devices: Secondary | ICD-10-CM | POA: Diagnosis not present

## 2016-12-09 DIAGNOSIS — I951 Orthostatic hypotension: Secondary | ICD-10-CM | POA: Insufficient documentation

## 2016-12-09 DIAGNOSIS — F411 Generalized anxiety disorder: Secondary | ICD-10-CM

## 2016-12-09 DIAGNOSIS — R413 Other amnesia: Secondary | ICD-10-CM | POA: Insufficient documentation

## 2016-12-09 DIAGNOSIS — G4733 Obstructive sleep apnea (adult) (pediatric): Secondary | ICD-10-CM | POA: Diagnosis not present

## 2016-12-09 DIAGNOSIS — N183 Chronic kidney disease, stage 3 unspecified: Secondary | ICD-10-CM

## 2016-12-09 DIAGNOSIS — E6609 Other obesity due to excess calories: Secondary | ICD-10-CM

## 2016-12-09 DIAGNOSIS — I10 Essential (primary) hypertension: Secondary | ICD-10-CM | POA: Diagnosis not present

## 2016-12-09 DIAGNOSIS — I2510 Atherosclerotic heart disease of native coronary artery without angina pectoris: Secondary | ICD-10-CM | POA: Diagnosis not present

## 2016-12-09 DIAGNOSIS — R001 Bradycardia, unspecified: Secondary | ICD-10-CM | POA: Diagnosis not present

## 2016-12-09 DIAGNOSIS — E7800 Pure hypercholesterolemia, unspecified: Secondary | ICD-10-CM

## 2016-12-09 DIAGNOSIS — Z6831 Body mass index (BMI) 31.0-31.9, adult: Secondary | ICD-10-CM

## 2016-12-09 MED ORDER — CLOPIDOGREL BISULFATE 75 MG PO TABS: ORAL_TABLET | ORAL | 3 refills | Status: DC

## 2016-12-09 MED ORDER — ATORVASTATIN CALCIUM 40 MG PO TABS: 40.0000 mg | ORAL_TABLET | Freq: Every day | ORAL | 3 refills | Status: DC

## 2016-12-09 NOTE — Patient Instructions (Addendum)
Medication Instructions:   Stop the metoprolol Stay hydrated  Labwork:  Please proceed to the [place] for: Testosterone level  Testing/Procedures:  No further testing at this time   I recommend watching educational videos on topics of interest to you at:       [email or web address]  Enter code: [email or web address]    Follow-Up: It was a pleasure seeing you in the office today. Please call us if you have new issues that need to be addressed before your next appt.  [phone]  Your physician wants you to follow-up in: 12 months.  You will receive a reminder letter in the mail two months in advance. If you don't receive a letter, please call our office to schedule the follow-up appointment.  If you need a refill on your cardiac medications before your next appointment, please call your pharmacy.

## 2016-12-25 ENCOUNTER — Other Ambulatory Visit: Payer: Self-pay | Admitting: Internal Medicine

## 2017-02-09 ENCOUNTER — Emergency Department: Payer: Medicare Other

## 2017-02-09 ENCOUNTER — Encounter: Payer: Self-pay | Admitting: Emergency Medicine

## 2017-02-09 ENCOUNTER — Emergency Department
Admission: EM | Admit: 2017-02-09 | Discharge: 2017-02-09 | Disposition: A | Discharge status: Home / Self Care | Payer: Medicare Other | Attending: Student in an Organized Health Care Education/Training Program | Admitting: Student in an Organized Health Care Education/Training Program

## 2017-02-09 DIAGNOSIS — Y939 Activity, unspecified: Secondary | ICD-10-CM | POA: Diagnosis not present

## 2017-02-09 DIAGNOSIS — S43005D Unspecified dislocation of left shoulder joint, subsequent encounter: Secondary | ICD-10-CM

## 2017-02-09 DIAGNOSIS — Z87891 Personal history of nicotine dependence: Secondary | ICD-10-CM | POA: Diagnosis not present

## 2017-02-09 DIAGNOSIS — I129 Hypertensive chronic kidney disease with stage 1 through stage 4 chronic kidney disease, or unspecified chronic kidney disease: Secondary | ICD-10-CM | POA: Insufficient documentation

## 2017-02-09 DIAGNOSIS — M25512 Pain in left shoulder: Secondary | ICD-10-CM | POA: Diagnosis not present

## 2017-02-09 DIAGNOSIS — W010XXA Fall on same level from slipping, tripping and stumbling without subsequent striking against object, initial encounter: Secondary | ICD-10-CM | POA: Diagnosis not present

## 2017-02-09 DIAGNOSIS — Y929 Unspecified place or not applicable: Secondary | ICD-10-CM | POA: Insufficient documentation

## 2017-02-09 DIAGNOSIS — S0990XA Unspecified injury of head, initial encounter: Secondary | ICD-10-CM | POA: Insufficient documentation

## 2017-02-09 DIAGNOSIS — Y999 Unspecified external cause status: Secondary | ICD-10-CM | POA: Insufficient documentation

## 2017-02-09 DIAGNOSIS — N183 Chronic kidney disease, stage 3 (moderate): Secondary | ICD-10-CM | POA: Insufficient documentation

## 2017-02-09 DIAGNOSIS — S42255A Nondisplaced fracture of greater tuberosity of left humerus, initial encounter for closed fracture: Secondary | ICD-10-CM | POA: Diagnosis not present

## 2017-02-09 DIAGNOSIS — T148XXA Other injury of unspecified body region, initial encounter: Secondary | ICD-10-CM | POA: Diagnosis not present

## 2017-02-09 DIAGNOSIS — S43015A Anterior dislocation of left humerus, initial encounter: Secondary | ICD-10-CM

## 2017-02-09 DIAGNOSIS — I2510 Atherosclerotic heart disease of native coronary artery without angina pectoris: Secondary | ICD-10-CM | POA: Diagnosis not present

## 2017-02-09 DIAGNOSIS — S6992XA Unspecified injury of left wrist, hand and finger(s), initial encounter: Secondary | ICD-10-CM | POA: Diagnosis present

## 2017-02-09 MED ORDER — TRAMADOL HCL 50 MG PO TABS: 50.0000 mg | ORAL_TABLET | Freq: Four times a day (QID) | ORAL | 0 refills | Status: DC | PRN

## 2017-02-09 MED ORDER — HYDROMORPHONE HCL 1 MG/ML IJ SOLN
1.0000 mg | Freq: Once | INTRAMUSCULAR | Status: AC
  Administered 2017-02-09: 1 mg via INTRAMUSCULAR
  Filled 2017-02-09: qty 1

## 2017-02-09 MED ORDER — ETOMIDATE 2 MG/ML IV SOLN
0.1500 mg/kg | Freq: Once | INTRAVENOUS | Status: AC
  Administered 2017-02-09: 12.1 mg via INTRAVENOUS
  Filled 2017-02-09: qty 10

## 2017-02-09 MED ORDER — TRAMADOL HCL 50 MG PO TABS
50.0000 mg | ORAL_TABLET | Freq: Once | ORAL | Status: AC
  Administered 2017-02-09: 50 mg via ORAL
  Filled 2017-02-09: qty 1

## 2017-02-09 MED ORDER — ORPHENADRINE CITRATE 30 MG/ML IJ SOLN
60.0000 mg | Freq: Once | INTRAMUSCULAR | Status: AC
  Administered 2017-02-09: 60 mg via INTRAMUSCULAR
  Filled 2017-02-09: qty 2

## 2017-02-09 NOTE — ED Notes (Signed)
Pt able to eat/drink w/o issue  

## 2017-02-09 NOTE — ED Notes (Signed)
Patient transported to CT 

## 2017-02-09 NOTE — ED Notes (Signed)
Pt moved to room 19  Report called to [facility]

## 2017-02-09 NOTE — ED Triage Notes (Signed)
Brought in via ems s/p fall  States he slipped and fell getting out of pool  Abrasion noted to nose  And pain to left shoulder

## 2017-02-09 NOTE — Sedation Documentation (Signed)
XR at bedside

## 2017-02-09 NOTE — ED Notes (Signed)
Spoke w/ pts emergency contact/ex wife.  Went over discharge instructions, follow up care and ED visit. She stated that she would arrange ride, confirmed that pick up on way.

## 2017-02-09 NOTE — ED Provider Notes (Signed)
[facility] Emergency Department Provider Note   ____________________________________________   I have reviewed the triage vital signs and the nursing notes.   HISTORY  Chief Complaint Fall    HPI Edwin Woodward is a 66 y.o. male presents with left shoulder pain after falling getting out of the pool. Patient immediately felt his shoulder "pop" out of joint and onset of severe pain. Patient noted movement and sensation in the left upper extremity following the injury except the shoulder joint. He denies past injury to the left shoulder. Patient denies fever, chills, headache, vision changes, chest pain, chest tightness, shortness of breath, abdominal pain, nausea and vomiting.  Past Medical History:  Diagnosis Date  . CAD (coronary artery disease)   . Cardiovascular stress test abnormal    Jan 2010 no significant ischemia  . Hyperlipidemia   . Hypertension   . Presence of bare metal stent in right coronary artery    proximal RCA adn LAD in April 2009  . Trigger finger     Patient Active Problem List   Diagnosis Date Noted  . Memory loss 12/09/2016  . Orthostasis 12/09/2016  . Bradycardia 12/09/2016  . Diarrhea due to drug 11/16/2016  . 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 GERD (gastroesophageal reflux disease) 04/01/2016  . CKD (chronic kidney disease) stage 3, GFR 30-59 ml/min 04/01/2016  . Gout of foot 11/26/2014  . Hypomagnesemia 08/26/2014  . Intention tremor 06/07/2014  . Left sided sciatica 06/07/2014  . Musculoskeletal neck pain 01/19/2014  . Allergic rhinitis 12/04/2013  . Thalassemia 08/16/2011  . GAD (generalized anxiety disorder) 05/14/2011  . Obesity 04/11/2011  . OSA on CPAP 04/11/2011  . Hyperlipidemia 11/22/2009  . HYPERTENSION, BENIGN 11/22/2009  . CAD, NATIVE VESSEL 11/22/2009    Past Surgical History:  Procedure Laterality Date  . APPENDECTOMY    . APPENDECTOMY      . CARPAL TUNNEL RELEASE    . CATARACT EXTRACTION     right  . CORONARY ANGIOPLASTY  12/2007  . CORONARY ARTERY BYPASS GRAFT     2 vessel  . JOINT REPLACEMENT     total knee  . REPLACEMENT TOTAL KNEE    . TONSILLECTOMY    . VASECTOMY      Prior to Admission medications   Medication Sig Start Date End Date Taking? Authorizing Provider  alfuzosin (UROXATRAL) 10 MG 24 hr tablet Take 1 tablet (10 mg total) by mouth daily. 05/23/16   [name], MD  aspirin EC 81 MG tablet Take 1 tablet (81 mg total) by mouth daily. 08/15/15   Minna Merritts, MD  atorvastatin (LIPITOR) 40 MG tablet Take 1 tablet (40 mg total) by mouth daily at 6 PM. Take 40 mg alt with 20 mg daily. 12/09/16   Minna Merritts, MD  buPROPion (WELLBUTRIN SR) 150 MG 12 hr tablet TAKE 1 TABLET BY MOUTH TWICE A DAY 08/13/15   Clapacs, Madie Reno, MD  calcium carbonate 1250 MG capsule Take 1,250 mg by mouth 2 (two) times daily with a meal.    [provider]  citalopram (CELEXA) 20 MG tablet Take 30 mg by mouth daily. 03/20/16   [provider]  clindamycin (CLEOCIN) 300 MG capsule 2 CAPSULES ONE HOUR BEFORE DENTAL PROCEDURE 02/14/16   [name], MD  clopidogrel (PLAVIX) 75 MG tablet TAKE 1 TABLET (75 MG TOTAL) BY MOUTH DAILY. 12/09/16   Minna Merritts, MD  diclofenac sodium (VOLTAREN)  1 % GEL Apply 1 application topically as needed. 11/19/11   [name], MD  donepezil (ARICEPT) 10 MG tablet Take 10 mg by mouth at bedtime. Pt takes 5mg  in the morning and 5mg  at night 03/20/16   [provider]  fluticasone (FLONASE) 50 MCG/ACT nasal spray Place 2 sprays into both nostrils daily. 10/03/15   Leone Haven, MD  magnesium oxide (MAG-OX) 400 (241.3 Mg) MG tablet TAKE 1 TABLET (400 MG TOTAL) BY MOUTH 2 (TWO) TIMES DAILY AFTER A MEAL 12/26/16   [name], MD  nitroGLYCERIN (NITROSTAT) 0.4 MG SL tablet Place 1 tablet (0.4 mg total) under the tongue every 5 (five) minutes as needed. 04/11/11   Minna Merritts, MD  nystatin (MYCOSTATIN/NYSTOP) powder Apply topically. 07/23/16   [provider]  risperiDONE (RISPERDAL) 0.25 MG tablet Take 0.25 mg by mouth 2 (two) times daily.  09/14/15 10/14/15  [provider]  traMADol (ULTRAM) 50 MG tablet Take 1 tablet (50 mg total) by mouth every 6 (six) hours as needed (1-2 tablets every 6 hours as needed.). 02/09/17 02/09/18  Dimas Scheck M, PA-C    Allergies Biaxin [clarithromycin]; Clarithromycin; Codeine; Penicillins; Promethazine; and Iodinated diagnostic agents  Family History  Problem Relation Age of Onset  . Hypertension Mother   . Diabetes Mother   . Thalassemia Mother   . Heart disease Father     Social History Social History  Substance Use Topics  . Smoking status: Former Research scientist (life sciences)  . Smokeless tobacco: Never Used  . Alcohol use No    Review of Systems Constitutional: Negative for fever/chills Cardiovascular: Denies chest pain. Respiratory: Denies cough Denies shortness of breath. Musculoskeletal: Right shoulder pain, possible dislocation. Skin: Negative for rash. Neurological: Negative for headaches.  Negative focal weakness or numbness. Negative for loss of consciousness. Able to ambulate. ____________________________________________   PHYSICAL EXAM:  VITAL SIGNS: ED Triage Vitals  Enc Vitals Group     BP 02/09/17 1323 119/80     Pulse Rate 02/09/17 1323 93     Resp 02/09/17 1323 20     Temp 02/09/17 1323 98.4 F (36.9 C)     Temp src --      SpO2 02/09/17 1323 97 %     Weight 02/09/17 1322 178 lb (80.7 kg)     Height 02/09/17 1322 5\' 3"  (1.6 m)     Head Circumference --      Peak Flow --      Pain Score 02/09/17 1321 8     Pain Loc --      Pain Edu? --      Excl. in [place]? --     Constitutional: Alert and oriented. Well appearing and in no acute distress.  Head: Normocephalic and atraumatic. Eyes: Conjunctivae are normal.  Cardiovascular: Normal rate, regular rhythm. Normal distal  pulses. Respiratory: Normal respiratory effort.  Musculoskeletal: Left shoulder pain secondary to anterior dislocation and fracture. Sensation and movement intact to left elbow, wrist and hand.  Neurologic: Normal speech and language.   Skin:  Skin is warm, dry and intact. No rash noted. Psychiatric: Mood and affect are normal.  ____________________________________________   LABS (all labs ordered are listed, but only abnormal results are displayed)  Labs Reviewed - No data to display ____________________________________________  EKG none ____________________________________________  RADIOLOGY DG left shoulder FINDINGS: Anterior dislocation of the humeral head with a fracture through the base of the greater tuberosity with distal displacement of the tuberosity fragment. Mild to moderate  superior and mild inferior acromioclavicular spur formation.  IMPRESSION: 1. Anterior dislocation and greater tuberosity fracture. 2. AC joint degenerative changes. ____________________________________________   PROCEDURES  Procedure(s) performed: no    Critical Care performed: no ____________________________________________   INITIAL IMPRESSION / ASSESSMENT AND PLAN / ED COURSE  Pertinent labs & imaging results that were available during my care of the patient were reviewed by me and considered in my medical decision making (see chart for details).  Patient presented with left shoulder pain secondary to anterior dislocation and greater tuberosity fracture secondary to falling earlier today. Physical exam and imaging confirmed dislocation and fracture. Attempted reduction on 2 attempts with Dilaudid then Dilaudid and Norflex without success reduction. After discussion with Dr. Quentin Cornwall does agree upon the next attempt would be under sedation. Care was transferred to Dr. Quentin Cornwall for left shoulder reduction procedure to be under sedation.  ----------------------------------------- 5:50  PM on 02/09/2017 ----------------------------------------     ____________________________________________   FINAL CLINICAL IMPRESSION(S) / ED DIAGNOSES  Final diagnoses:  Closed anterior dislocation of left shoulder, initial encounter  Nondisplaced fracture of greater tuberosity of left humerus, initial encounter for closed fracture       NEW MEDICATIONS STARTED DURING THIS VISIT:  New Prescriptions   TRAMADOL (ULTRAM) 50 MG TABLET    Take 1 tablet (50 mg total) by mouth every 6 (six) hours as needed (1-2 tablets every 6 hours as needed.).     Note:  This document was prepared using Dragon voice recognition software and may include unintentional dictation errors.   Jerolyn Shin, PA-C 02/10/17 1522    Harvest Dark, MD 02/10/17 2245

## 2017-02-09 NOTE — ED Provider Notes (Addendum)
Patient received in sign-out from [place].  Workup shows evidence of acutely dislocated left shoulder with a displaced fracture of the greater tuberosity. They have attempted multiple reductions with IM medications but is been unsuccessful with multiple attempts. At this point I do feel it'll be prudent to perform procedural sedation for reduction.   ----------------------------------------- 6:32 PM on 02/09/2017 ----------------------------------------- Pre procedure evaluation performed.  Patient consented to procedure.    ORTHOPEDIC INJURY TREATMENT Date/Time: 02/09/2017 7:12 PM Performed by: Merlyn Lot Authorized by: Merlyn Lot  Consent: Written consent obtained. Consent given by: patient Patient understanding: patient states understanding of the procedure being performed Patient consent: the patient's understanding of the procedure matches consent given Procedure consent: procedure consent matches procedure scheduled Relevant documents: relevant documents present and verified Test results: test results available and properly labeled Imaging studies: imaging studies available Patient identity confirmed: arm band Time out: Immediately prior to procedure a "time out" was called to verify the correct patient, procedure, equipment, support staff and site/side marked as required. Injury location: shoulder Location details: left shoulder Injury type: fracture-dislocation Dislocation type: anterior Fracture type: greater humeral tuberosity Pre-procedure neurovascular assessment: neurovascularly intact Pre-procedure distal perfusion: normal Pre-procedure neurological function: normal Pre-procedure range of motion: reduced  Sedation: Patient sedated: yes Sedation type: moderate (conscious) sedation Sedatives: etomidate Vitals: Vital signs were monitored during sedation. Manipulation performed: yes Skin traction used: yes Reduction successful: yes X-ray confirmed  reduction: yes Immobilization: sling Post-procedure neurovascular assessment: post-procedure neurovascularly intact Post-procedure distal perfusion: normal Post-procedure neurological function: normal Post-procedure range of motion: normal Patient tolerance: Patient tolerated the procedure well with no immediate complications    ASA II  ----------------------------------------- 8:46 PM on 02/09/2017 -----------------------------------------  Shoulder was reduced without any, location. Patient taken to CT is did have head injury and is on Plavix. Patient no acute distress.    Merlyn Lot, MD 02/09/17 2046    Merlyn Lot, MD 03/03/17 [phone]

## 2017-02-12 ENCOUNTER — Telehealth: Payer: Self-pay | Admitting: Internal Medicine

## 2017-02-12 NOTE — Telephone Encounter (Signed)
Left message on pt's voicemail per pt's wife's request.

## 2017-02-12 NOTE — Telephone Encounter (Signed)
Home health cannot be ordered unless there is a office visit to support the need for it.  I suggest having the orthopedist order it at their visit today

## 2017-02-12 NOTE — Telephone Encounter (Signed)
Pt's wife Harmon Pier lvm on my ext asking for call back. She states Zayde fell on Monday and was at the hospital most of the day and night. She states she has a couple questions that she needs to ask Dr. Derrel Nip. Please cb [phone]

## 2017-02-12 NOTE — Telephone Encounter (Signed)
Spoke with pt's wife and she stated that on Monday she received a call from a lady at a community pool. This lady informed her that the pt had been at the pool swimming and he did ok swimming but that when he was getting out he seemed very weak and disoriented. The pt then fell and the lady called 911. He went to [facility] and according to the wife they tried for eight hours to sedate the pt with IV muscle relaxer and pain medication, which she stated did not work. She stated after that they finally put him under just a light sedation and was able to correct his dislocated shoulder and put him in a mobilizer. The pt was given Tramadol for pain but wasn't able to start taking it until the next morning when the pharmacy finally opened. The wife is wanting to know if he would qualify for some home health care until he gets better. She asked this because the pt on Tuesday she was able to take the mobilizer off of his arm and give him a bath. However she stated that the it took her 6 hours to give him a bath, shave him, get him dressed and put he mobilizer back on because he was refusing to let her do anything and being very hateful. The wife stated that they go see the orthopedist today at 12:30pm. She stated that she understands if you can not make that call since you have not seen the pt for this reason.

## 2017-02-14 ENCOUNTER — Other Ambulatory Visit: Payer: Self-pay | Admitting: Internal Medicine

## 2017-02-19 ENCOUNTER — Telehealth: Payer: Self-pay | Admitting: Internal Medicine

## 2017-03-02 MED ORDER — CLINDAMYCIN HCL 300 MG PO CAPS: ORAL_CAPSULE | ORAL | 5 refills | Status: DC

## 2017-03-02 NOTE — Telephone Encounter (Signed)
Pt wife called about needing a refill for the medication. Pt missed appt due to not having this medication. Pt is now being charged for cancelling appt. Pt needs refills for the medication. Pt has three more appts between now and July 1st. Please advise?  Call pt @ [phone]. Thank you!

## 2017-03-02 NOTE — Telephone Encounter (Signed)
Is it ok to refill this medication? 

## 2017-03-02 NOTE — Telephone Encounter (Signed)
Spoke with pt's wife and she stated that yes she was talking about dental appts. She was very frustrated because that she had to cancel one of the pt's dental appts because she couldn't get his medication refilled. She stated that the pharmacy told her that rx had expired and that they did not get the refill that we sent over on 02/20/2017, then she found out that they did get the rx. Wife was just very frustrated with the pharmacy but everything is ok now.

## 2017-03-02 NOTE — Telephone Encounter (Signed)
LMTCB

## 2017-03-02 NOTE — Telephone Encounter (Signed)
Message unclear.  The medication requested is an antibiotic to take before a dental appointment.  Are those the "missed" and future appointments" you are referring to in your note?

## 2017-03-02 NOTE — Addendum Note (Signed)
Addended by: [name] on: 03/02/2017 01:23 PM   Modules accepted: Orders

## 2017-06-19 ENCOUNTER — Other Ambulatory Visit: Payer: Self-pay | Admitting: Internal Medicine

## 2017-06-22 ENCOUNTER — Encounter: Payer: Self-pay | Admitting: Internal Medicine

## 2017-06-22 ENCOUNTER — Ambulatory Visit (INDEPENDENT_AMBULATORY_CARE_PROVIDER_SITE_OTHER): Payer: Medicare Other | Admitting: Internal Medicine

## 2017-06-22 VITALS — BP 106/72 | HR 65 | Temp 98.2°F | Resp 14 | Ht 63.0 in | Wt 174.8 lb

## 2017-06-22 DIAGNOSIS — G4733 Obstructive sleep apnea (adult) (pediatric): Secondary | ICD-10-CM | POA: Diagnosis not present

## 2017-06-22 DIAGNOSIS — F0280 Dementia in other diseases classified elsewhere without behavioral disturbance: Secondary | ICD-10-CM | POA: Diagnosis not present

## 2017-06-22 DIAGNOSIS — G3109 Other frontotemporal dementia: Secondary | ICD-10-CM | POA: Diagnosis not present

## 2017-06-22 DIAGNOSIS — J301 Allergic rhinitis due to pollen: Secondary | ICD-10-CM | POA: Diagnosis not present

## 2017-06-22 DIAGNOSIS — H1011 Acute atopic conjunctivitis, right eye: Secondary | ICD-10-CM

## 2017-06-22 DIAGNOSIS — Z79899 Other long term (current) drug therapy: Secondary | ICD-10-CM | POA: Diagnosis not present

## 2017-06-22 DIAGNOSIS — Z23 Encounter for immunization: Secondary | ICD-10-CM

## 2017-06-22 DIAGNOSIS — I2510 Atherosclerotic heart disease of native coronary artery without angina pectoris: Secondary | ICD-10-CM | POA: Diagnosis not present

## 2017-06-22 DIAGNOSIS — G1220 Motor neuron disease, unspecified: Secondary | ICD-10-CM | POA: Diagnosis not present

## 2017-06-22 DIAGNOSIS — Z9989 Dependence on other enabling machines and devices: Secondary | ICD-10-CM

## 2017-06-22 MED ORDER — OLOPATADINE HCL 0.1 % OP SOLN: 1.0000 [drp] | Freq: Two times a day (BID) | OPHTHALMIC | 12 refills | Status: DC
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
## 2017-06-22 NOTE — Patient Instructions (Signed)
Allergic Conjunctivitis A clear membrane (conjunctiva) covers the white part of your eye and the inner surface of your eyelid. Allergic conjunctivitis happens when this membrane has inflammation. This is caused by allergies. Common causes of allergic reactions (allergens)include:  Outdoor allergens, such as:  Pollen.  Grass and weeds.  Mold spores.  Indoor allergens, such as:  Dust.  Smoke.  Mold.  Pet dander.  Animal hair. This condition can make your eye red or pink. It can also make your eye feel itchy. This condition cannot be spread from one person to another person (is not contagious). Follow these instructions at home:  Try not to be around things that you are allergic to.  Take or apply over-the-counter and prescription medicines only as told by your doctor. These include any eye drops.  Place a cool, clean washcloth on your eye for 10-20 minutes. Do this 3-4 times a day.  Do not touch or rub your eyes.  Do not wear contact lenses until the inflammation is gone. Wear glasses instead.  Do not wear eye makeup until the inflammation is gone.  Keep all follow-up visits as told by your doctor. This is important. Contact a doctor if:  Your symptoms get worse.  Your symptoms do not get better with treatment.  You have mild eye pain.  You are sensitive to light,  You have spots or blisters on your eyes.  You have pus coming from your eye.  You have a fever. Get help right away if:  You have redness, swelling, or other symptoms in only one eye.  Your vision is blurry.  You have vision changes.  You have very bad eye pain. Summary  Allergic conjunctivitis is caused by allergies. It can make your eye red or pink, and it can make your eye feel itchy.  This condition cannot be spread from one person to another person (is not contagious).  Try not to be around things that you are allergic to.  Take or apply over-the-counter and prescription medicines  only as told by your doctor. These include any eye drops.  Contact your doctor if your symptoms get worse or they do not get better with treatment. This information is not intended to replace advice given to you by your health care provider. Make sure you discuss any questions you have with your health care provider. Document Released: 02/12/2010 Document Revised: 04/18/2016 Document Reviewed: 04/18/2016 Elsevier Interactive Patient Education  2017 [employer]  

## 2017-06-23 ENCOUNTER — Encounter: Payer: Self-pay | Admitting: Internal Medicine

## 2017-06-23 DIAGNOSIS — H1011 Acute atopic conjunctivitis, right eye: Secondary | ICD-10-CM | POA: Insufficient documentation

## 2017-06-23 LAB — COMPREHENSIVE METABOLIC PANEL
ALT: 22 U/L (ref 0–53)
AST: 23 U/L (ref 0–37)
Albumin: 4.3 g/dL (ref 3.5–5.2)
Alkaline Phosphatase: 73 U/L (ref 39–117)
BUN: 32 mg/dL — AB (ref 6–23)
CHLORIDE: 104 meq/L (ref 96–112)
CO2: 26 meq/L (ref 19–32)
CREATININE: 1.27 mg/dL (ref 0.40–1.50)
Calcium: 9.8 mg/dL (ref 8.4–10.5)
GFR: 60.11 mL/min (ref >60.00)
GLUCOSE: 91 mg/dL (ref 70–99)
Potassium: 4 mEq/L (ref 3.5–5.1)
SODIUM: 139 meq/L (ref 135–145)
Total Bilirubin: 0.5 mg/dL (ref 0.2–1.2)
Total Protein: 6.9 g/dL (ref 6.0–8.3)

## 2017-06-23 NOTE — Assessment & Plan Note (Signed)
patanol eye drops bid as needed .

## 2017-06-23 NOTE — Assessment & Plan Note (Signed)
He has become quite dependent on his estranged wife Harmon Pier.  He is easily agitated and his sons' recurrent attempts to recover the inheritance that the patient's mother left him in her will has been emotionally devastating to him and has resulted in decompensation due to agitation.

## 2017-06-23 NOTE — Assessment & Plan Note (Signed)
Continue claritin. °

## 2017-06-23 NOTE — Assessment & Plan Note (Signed)
Diagnosed by sleep study. he is wearing her CPAP every night a minimum of 6 hours per night and notes improved daytime wakefulness and decreased fatigue  

## 2017-09-14 DIAGNOSIS — N401 Enlarged prostate with lower urinary tract symptoms: Secondary | ICD-10-CM | POA: Diagnosis not present

## 2017-09-14 DIAGNOSIS — K4090 Unilateral inguinal hernia, without obstruction or gangrene, not specified as recurrent: Secondary | ICD-10-CM | POA: Diagnosis not present

## 2017-09-14 DIAGNOSIS — N411 Chronic prostatitis: Secondary | ICD-10-CM | POA: Diagnosis not present

## 2017-09-14 DIAGNOSIS — Z6831 Body mass index (BMI) 31.0-31.9, adult: Secondary | ICD-10-CM | POA: Diagnosis not present

## 2017-09-14 DIAGNOSIS — R339 Retention of urine, unspecified: Secondary | ICD-10-CM | POA: Diagnosis not present

## 2017-09-14 DIAGNOSIS — R972 Elevated prostate specific antigen [PSA]: Secondary | ICD-10-CM | POA: Diagnosis not present

## 2017-11-23 DIAGNOSIS — G4733 Obstructive sleep apnea (adult) (pediatric): Secondary | ICD-10-CM | POA: Diagnosis not present

## 2017-12-04 ENCOUNTER — Encounter: Payer: Self-pay | Admitting: Internal Medicine

## 2017-12-04 ENCOUNTER — Ambulatory Visit (INDEPENDENT_AMBULATORY_CARE_PROVIDER_SITE_OTHER): Payer: Self-pay | Admitting: Internal Medicine

## 2017-12-04 ENCOUNTER — Ambulatory Visit (INDEPENDENT_AMBULATORY_CARE_PROVIDER_SITE_OTHER): Payer: Self-pay

## 2017-12-04 VITALS — BP 120/74 | HR 76 | Temp 98.6°F | Ht 63.0 in | Wt 176.8 lb

## 2017-12-04 DIAGNOSIS — Z13818 Encounter for screening for other digestive system disorders: Secondary | ICD-10-CM

## 2017-12-04 DIAGNOSIS — I10 Essential (primary) hypertension: Secondary | ICD-10-CM

## 2017-12-04 DIAGNOSIS — R0602 Shortness of breath: Secondary | ICD-10-CM | POA: Diagnosis not present

## 2017-12-04 DIAGNOSIS — K921 Melena: Secondary | ICD-10-CM

## 2017-12-04 DIAGNOSIS — Z1329 Encounter for screening for other suspected endocrine disorder: Secondary | ICD-10-CM

## 2017-12-04 DIAGNOSIS — D649 Anemia, unspecified: Secondary | ICD-10-CM

## 2017-12-04 LAB — HEMOCCULT GUIAC POC 1CARD (OFFICE): Fecal Occult Blood, POC: NEGATIVE

## 2017-12-04 NOTE — Progress Notes (Signed)
Pre visit review using our clinic review tool, if applicable. No additional management support is needed unless otherwise documented below in the visit note. 

## 2017-12-04 NOTE — Patient Instructions (Addendum)
Fu with Dr. Derrel Nip 2-3 weeks  You did not have blood in stool today   PSA, DIAGNOSTIC Routine 09/14/2017 9:01 AM EST Elevated prostate specific antigen (PSA)  Results for this procedure are in the results section.   POCT URINALYSIS DIPSTICK, INTERFACED Routine 09/14/2017 8:28 AM EST  Results for this procedure are in the results section.   BLADDER SCAN Routine 09/14/2017 8:22 AM EST Incomplete bladder emptying  Results for this procedure are in the results section.   Lab Results - in this encounter Table of Contents for Lab Results  PSA, Diagnostic  POCT Urinalysis Dipstick     PSA, Diagnostic PSA, Diagnostic  Component Value Ref Range Performed At  PSA 1.34 0.00 - 4.00 ng/mL [facility]   PSA, Diagnostic  Specimen  Blood   PSA, Diagnostic  Performing Organization Address City/State/Zipcode Phone Number  [place]  [address]  [place], [place] [address] [phone]   Back to top of Lab Results    POCT Urinalysis Dipstick POCT Urinalysis Dipstick  Component Value Ref Range Performed At  Spec Gravity/POC >=1.030 1.003 - 1.030 [facility]  PH/POC 5.5 5.0 - 9.0 [facility]  Leuk Esterase/POC Negative Negative [facility]  Nitrite/POC Negative Negative [facility]  Protein/POC Negative Negative [facility]  UA Glucose/POC Negative Negative [facility]  Ketones, POC Negative Negative [facility]  Bilirubin/POC Negative Negative [facility]  Blood/POC 1+ (A) Negative [facility]  Urobilinogen/POC 0.2 0.2 - 1.0 mg/dL [facility]   POCT Urinalysis Dipstick  Specimen  Urine   POCT Urinalysis Dipstick  Performing Organization Address City/State/Zipcode Phone Number  [facility]  [facility] AT [facility]  [address] [place], [place] [address]    Back to top of Lab Results Miscellaneous Results - in this encounter  Bladder scan Bladder scan  Impressions Performed At  Bladder Scan:   Volume: 0 ml's.   Performed by: Darl Householder, CMA.   Void status: Post void residual.     Visit Diagnoses  Diagnosis  Elevated prostate specific antigen (PSA) - Primary   Benign localized prostatic hyperplasia with (LUTS)   Chronic prostatitis   Non-recurrent unilateral inguinal hernia without obstruction or gangrene   Incomplete bladder emptying   Discontinued Medications - as of this encounter Prescription Sig. Discontinue Reason Start Date End Date  alfuzosin (UROXATRAL) 10 mg 24 hr tablet  TAKE 1 TABLET BY MOUTH EVERY DAY Reorder 09/07/2017 09/14/2017  Images Patient Demographics  Patient Address Communication Language Race / Ethnicity  [place], [place] [address] [phone] (Home) English (Preferred) Unknown / Unknown  Patient Contacts  Contact Name Contact Address Communication Relationship to Patient  Deloyd Handy Unknown [phone] [facility]) Emergency Contact    Shortness of Breath, Adult Shortness of breath means you have trouble breathing. Your lungs are organs for breathing. Follow these instructions at home: Pay attention to any changes in your symptoms. Take these actions to help with your condition:  Do not smoke. Smoking can cause shortness of breath. If you need help to quit smoking, ask your doctor.  Avoid things that can make it harder to breathe, such as: ? Mold. ? Dust. ? Air pollution. ? Chemical smells. ? Things that can cause allergy symptoms (allergens), if you have allergies.  Keep your living space  clean and free of mold and dust.  Rest as needed. Slowly return to your usual activities.  Take over-the-counter and prescription medicines, including oxygen and inhaled  medicines, only as told by your doctor.  Keep all follow-up visits as told by your doctor. This is important.  Contact a doctor if:  Your condition does not get better as soon as expected.  You have a hard time doing your normal activities, even after you rest.  You have new symptoms. Get help right away if:  You have trouble breathing when you are resting.  You feel light-headed or you faint.  You have a cough that is not helped by medicines.  You cough up blood.  You have pain with breathing.  You have pain in your chest, arms, shoulders, or belly (abdomen).  You have a fever.  You cannot walk up stairs.  You cannot exercise the way you normally do. This information is not intended to replace advice given to you by your health care provider. Make sure you discuss any questions you have with your health care provider. Document Released: 02/11/2008 Document Revised: 09/11/2016 Document Reviewed: 09/11/2016 Elsevier Interactive Patient Education  2017 [employer].

## 2017-12-05 LAB — COMPREHENSIVE METABOLIC PANEL
AG RATIO: 1.7 (calc) (ref 1.0–2.5)
ALBUMIN MSPROF: 4 g/dL (ref 3.6–5.1)
ALT: 20 U/L (ref 9–46)
AST: 24 U/L (ref 10–35)
Alkaline phosphatase (APISO): 85 U/L (ref 40–115)
BUN: 25 mg/dL (ref 7–25)
CO2: 25 mmol/L (ref 20–32)
Calcium: 9.1 mg/dL (ref 8.6–10.3)
Chloride: 107 mmol/L (ref 98–110)
Creat: 1.12 mg/dL (ref 0.70–1.25)
GLUCOSE: 89 mg/dL (ref 65–99)
Globulin: 2.3 g/dL (calc) (ref 1.9–3.7)
POTASSIUM: 4.1 mmol/L (ref 3.5–5.3)
SODIUM: 142 mmol/L (ref 135–146)
TOTAL PROTEIN: 6.3 g/dL (ref 6.1–8.1)
Total Bilirubin: 0.5 mg/dL (ref 0.2–1.2)

## 2017-12-05 LAB — CBC WITH DIFFERENTIAL/PLATELET
Basophils Absolute: 47 cells/uL (ref 0–200)
Basophils Relative: 0.6 %
EOS ABS: 205 {cells}/uL (ref 15–500)
EOS PCT: 2.6 %
HEMATOCRIT: 27.9 % — AB (ref 38.5–50.0)
HEMOGLOBIN: 9.1 g/dL — AB (ref 13.2–17.1)
LYMPHS ABS: 1833 {cells}/uL (ref 850–3900)
MCH: 21.3 pg — ABNORMAL LOW (ref 27.0–33.0)
MCHC: 32.6 g/dL (ref 32.0–36.0)
MCV: 65.2 fL — ABNORMAL LOW (ref 80.0–100.0)
MPV: 10.3 fL (ref 7.5–12.5)
Monocytes Relative: 10.7 %
NEUTROS ABS: 4969 {cells}/uL (ref 1500–7800)
Neutrophils Relative %: 62.9 %
Platelets: 363 10*3/uL (ref 140–400)
RBC: 4.28 10*6/uL (ref 4.20–5.80)
RDW: 19.9 % — ABNORMAL HIGH (ref 11.0–15.0)
Total Lymphocyte: 23.2 %
WBC mixed population: 845 cells/uL (ref 200–950)
WBC: 7.9 10*3/uL (ref 3.8–10.8)

## 2017-12-05 LAB — URINALYSIS, ROUTINE W REFLEX MICROSCOPIC
Bilirubin Urine: NEGATIVE
Glucose, UA: NEGATIVE
Hgb urine dipstick: NEGATIVE
KETONES UR: NEGATIVE
Leukocytes, UA: NEGATIVE
NITRITE: NEGATIVE
Protein, ur: NEGATIVE
SPECIFIC GRAVITY, URINE: 1.027 (ref 1.001–1.03)

## 2017-12-05 LAB — HEPATITIS C ANTIBODY
Hepatitis C Ab: NONREACTIVE
SIGNAL TO CUT-OFF: 0.02 (ref <1.00)

## 2017-12-05 LAB — CBC MORPHOLOGY

## 2017-12-05 LAB — TSH: TSH: 1.68 mIU/L (ref 0.40–4.50)

## 2017-12-07 ENCOUNTER — Encounter: Payer: Self-pay | Admitting: Internal Medicine

## 2017-12-07 NOTE — Progress Notes (Signed)
Chief Complaint  Patient presents with  . Follow-up   F/u with Harmon Pier who contributes to history  1. Patient c/o loose stool, unable to sleep, sob and cough. He has been feeling sick since last Monday and only had 1 loose stool per wife. He did report stool was black and not sure if due to eating chocholate cake 11/22/17 will check stool for blood today. Pt also c/o he was feeling warm so he took 2 tylenol though he did not check his temp.  Harmon Pier reports their dog had been sick and this has been stressful dog had to have eye surgery and not doing well and when stress happens pt has a lot of bodily sx's she is not sure if he really is sick but he wanted to come so she decided to bring him in  2. 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  HENT: Negative for hearing loss.   Eyes: Negative for blurred vision.  Respiratory: Positive for shortness of breath.   Cardiovascular: Negative for chest pain.  Gastrointestinal: Positive for diarrhea. Negative for abdominal pain.  Musculoskeletal: Negative for falls.  Skin: Negative for rash.  Psychiatric/Behavioral: Positive for memory loss.   Past Medical History:  Diagnosis Date  . CAD (coronary artery disease)   . Cardiovascular stress test abnormal    Jan 2010 no significant ischemia  . Hyperlipidemia   . Hypertension   . Presence of bare metal stent in right coronary artery    proximal RCA adn LAD in April 2009  . Trigger finger    Past Surgical History:  Procedure Laterality Date  . APPENDECTOMY    . APPENDECTOMY    . CARPAL TUNNEL RELEASE    . CATARACT EXTRACTION     right  . CORONARY ANGIOPLASTY  12/2007  . CORONARY ARTERY BYPASS GRAFT     2 vessel  . JOINT REPLACEMENT     total knee  . REPLACEMENT TOTAL KNEE    . TONSILLECTOMY    . VASECTOMY     Family History  Problem Relation Age of Onset  . Hypertension Mother   . Diabetes Mother   . Thalassemia Mother    . Heart disease Father    Social History   Socioeconomic History  . Marital status: Married    Spouse name: Not on file  . Number of children: Not on file  . Years of education: Not on file  . Highest education level: Not on file  Occupational History  . Not on file  Social Needs  . Financial resource strain: Not on file  . Food insecurity:    Worry: Not on file    Inability: Not on file  . Transportation needs:    Medical: Not on file    Non-medical: Not on file  Tobacco Use  . Smoking status: Former Research scientist (life sciences)  . Smokeless tobacco: Never Used  Substance and Sexual Activity  . Alcohol use: No    Alcohol/week: 0.0 oz  . Drug use: No    Types: Marijuana  . Sexual activity: Not on file  Lifestyle  . Physical activity:    Days per week: Not on file    Minutes per session: Not on file  . Stress: Not on file  Relationships  . Social connections:    Talks on phone: Not on file    Gets together: Not on file    Attends religious service: Not on file  Active member of club or organization: Not on file    Attends meetings of clubs or organizations: Not on file    Relationship status: Not on file  . Intimate partner violence:    Fear of current or ex partner: Not on file    Emotionally abused: Not on file    Physically abused: Not on file    Forced sexual activity: Not on file  Other Topics Concern  . Not on file  Social History Narrative   Lives with wife.   Current Meds  Medication Sig  . alfuzosin (UROXATRAL) 10 MG 24 hr tablet Take 1 tablet (10 mg total) by mouth daily.  Marland Kitchen aspirin EC 81 MG tablet Take 1 tablet (81 mg total) by mouth daily.  Marland Kitchen atorvastatin (LIPITOR) 40 MG tablet Take 1 tablet (40 mg total) by mouth daily at 6 PM. Take 40 mg alt with 20 mg daily.  Marland Kitchen buPROPion (WELLBUTRIN SR) 150 MG 12 hr tablet TAKE 1 TABLET BY MOUTH TWICE A DAY  . calcium carbonate 1250 MG capsule Take 1,250 mg by mouth 2 (two) times daily with a meal.  . citalopram (CELEXA) 20 MG  tablet Take 30 mg by mouth daily.  . clindamycin (CLEOCIN) 300 MG capsule TAKE 2 CAPSULES ONE HOUR BEFORE DENTAL PROCEDURE  . clopidogrel (PLAVIX) 75 MG tablet TAKE 1 TABLET (75 MG TOTAL) BY MOUTH DAILY.  Marland Kitchen diclofenac sodium (VOLTAREN) 1 % GEL Apply 1 application topically as needed.  . donepezil (ARICEPT) 10 MG tablet Take 10 mg by mouth at bedtime. Pt takes 60m in the morning and 527mat night  . fluticasone (FLONASE) 50 MCG/ACT nasal spray Place 2 sprays into both nostrils daily.  . magnesium oxide (MAG-OX) 400 MG tablet TAKE 1 TABLET (400 MG TOTAL) BY MOUTH 2 (TWO) TIMES DAILY AFTER A MEAL  . nitroGLYCERIN (NITROSTAT) 0.4 MG SL tablet Place 1 tablet (0.4 mg total) under the tongue every 5 (five) minutes as needed.  . nystatin (MYCOSTATIN/NYSTOP) powder Apply topically.  . Marland Kitchenlopatadine (PATANOL) 0.1 % ophthalmic solution Place 1 drop into both eyes 2 (two) times daily.   Allergies  Allergen Reactions  . Biaxin [Clarithromycin]   . Clarithromycin   . Codeine     Sever vomiting   . Penicillins   . Promethazine   . Iodinated Diagnostic Agents Rash  . Metrizamide Rash   Recent Results (from the past 2160 hour(s))  Comp Met (CMET)     Status: None   Collection Time: 12/04/17  4:30 PM  Result Value Ref Range   Glucose, Bld 89 65 - 99 mg/dL    Comment: .            Fasting reference interval .    BUN 25 7 - 25 mg/dL   Creat 1.12 0.70 - 1.25 mg/dL    Comment: For patients >4937ears of age, the reference limit for Creatinine is approximately 13% higher for people identified as African-American. .    BUN/Creatinine Ratio NOT APPLICABLE 6 - 22 (calc)   Sodium 142 135 - 146 mmol/L   Potassium 4.1 3.5 - 5.3 mmol/L   Chloride 107 98 - 110 mmol/L   CO2 25 20 - 32 mmol/L   Calcium 9.1 8.6 - 10.3 mg/dL   Total Protein 6.3 6.1 - 8.1 g/dL   Albumin 4.0 3.6 - 5.1 g/dL   Globulin 2.3 1.9 - 3.7 g/dL (calc)   AG Ratio 1.7 1.0 - 2.5 (calc)   Total Bilirubin 0.5  0.2 - 1.2 mg/dL   Alkaline  phosphatase (APISO) 85 40 - 115 U/L   AST 24 10 - 35 U/L   ALT 20 9 - 46 U/L  CBC with Differential/Platelet     Status: Abnormal   Collection Time: 12/04/17  4:30 PM  Result Value Ref Range   WBC 7.9 3.8 - 10.8 Thousand/uL   RBC 4.28 4.20 - 5.80 Million/uL   Hemoglobin 9.1 (L) 13.2 - 17.1 g/dL   HCT 27.9 (L) 38.5 - 50.0 %   MCV 65.2 (L) 80.0 - 100.0 fL   MCH 21.3 (L) 27.0 - 33.0 pg   MCHC 32.6 32.0 - 36.0 g/dL   RDW 19.9 (H) 11.0 - 15.0 %   Platelets 363 140 - 400 Thousand/uL   MPV 10.3 7.5 - 12.5 fL   Neutro Abs 4,969 1,500 - 7,800 cells/uL   Lymphs Abs 1,833 850 - 3,900 cells/uL   WBC mixed population 845 200 - 950 cells/uL   Eosinophils Absolute 205 15 - 500 cells/uL   Basophils Absolute 47 0 - 200 cells/uL   Neutrophils Relative % 62.9 %   Total Lymphocyte 23.2 %   Monocytes Relative 10.7 %   Eosinophils Relative 2.6 %   Basophils Relative 0.6 %  TSH     Status: None   Collection Time: 12/04/17  4:30 PM  Result Value Ref Range   TSH 1.68 0.40 - 4.50 mIU/L  Urinalysis, Routine w reflex microscopic     Status: None   Collection Time: 12/04/17  4:30 PM  Result Value Ref Range   Color, Urine YELLOW YELLOW   APPearance CLEAR CLEAR   Specific Gravity, Urine 1.027 1.001 - 1.03   pH < OR = 5.0 5.0 - 8.0   Glucose, UA NEGATIVE NEGATIVE   Bilirubin Urine NEGATIVE NEGATIVE   Ketones, ur NEGATIVE NEGATIVE   Hgb urine dipstick NEGATIVE NEGATIVE   Protein, ur NEGATIVE NEGATIVE   Nitrite NEGATIVE NEGATIVE   Leukocytes, UA NEGATIVE NEGATIVE  Hepatitis C antibody     Status: None   Collection Time: 12/04/17  4:30 PM  Result Value Ref Range   Hepatitis C Ab NON-REACTIVE NON-REACTI   SIGNAL TO CUT-OFF 0.02 <1.00    Comment: . HCV antibody was non-reactive. There is no laboratory  evidence of HCV infection. . In most cases, no further action is required. However, if recent HCV exposure is suspected, a test for HCV RNA (test code [phone]) is suggested. . For additional  information please refer to http://education.[facility].com/faq/FAQ22v1 (This link is being provided for informational/ educational purposes only.) .   CBC MORPHOLOGY     Status: None   Collection Time: 12/04/17  4:30 PM  Result Value Ref Range   CBC MORPHOLOGY  NORMAL    Comment: Schistocytes 1 + Poikilocytosis 1 + Polychromasia 1 + Burr cells 1 +   POCT occult blood stool     Status: Normal   Collection Time: 12/04/17  4:35 PM  Result Value Ref Range   Fecal Occult Blood, POC Negative Negative   Card #1 Date     Card #2 Fecal Occult Blod, POC     Card #2 Date     Card #3 Fecal Occult Blood, POC     Card #3 Date     Objective  Body mass index is 31.32 kg/m. Wt Readings from Last 3 Encounters:  12/04/17 176 lb 12.8 oz (80.2 kg)  06/22/17 174 lb 12.8 oz (79.3 kg)  02/09/17 178 lb (80.7  kg)   Temp Readings from Last 3 Encounters:  12/04/17 98.6 F (37 C) (Oral)  06/22/17 98.2 F (36.8 C) (Oral)  02/09/17 98.4 F (36.9 C)   BP Readings from Last 3 Encounters:  12/04/17 120/74  06/22/17 106/72  02/09/17 (!) 143/93   Pulse Readings from Last 3 Encounters:  12/04/17 76  06/22/17 65  02/09/17 74    Physical Exam  Constitutional: He is oriented to person, place, and time and well-developed, well-nourished, and in no distress. Vital signs are normal.  HENT:  Head: Normocephalic and atraumatic.  Mouth/Throat: Oropharynx is clear and moist and mucous membranes are normal.  Eyes: Pupils are equal, round, and reactive to light. Conjunctivae are normal.  Cardiovascular: Normal rate, regular rhythm and normal heart sounds.  Pulmonary/Chest: Effort normal and breath sounds normal. He has no wheezes.  Abdominal: There is no tenderness.  Genitourinary: Rectal exam shows guaiac negative stool.  Neurological: He is alert and oriented to person, place, and time. Gait normal. Gait normal.  Skin: Skin is warm, dry and intact.  Psychiatric: Mood, memory, affect and  judgment normal.  Nursing note and vitals reviewed.   Assessment   1. Black loose stool x 1 FOBT x 1 negative today  2. SOB ? Etiology r/o pneumonia  3. Frontal lobe dementia  4. HM  Plan  1.  Check FOBT today negative x 1  Disc with PCP referral for GI if indicated last colonoscopy may have been 4-5 years ago per wife Dr. Phylis Bougie  2. CXR today negative  Consider echo if SOB continues or find record per wife had one previously unable to see in system  3. Cont Aricpet, Risperdal  4.  Had flu shot 06/2017  Tdap UTD  prevnar will need at f/u then pna 23 in 1 year  Consider shingrix in future   Check labs today CMET, CBC, TSH, UA, hep C, PSA had 09/14/17 Dr. Jacqlyn Larsen 1.34 normal  Colonoscopy defer to PCP when due unclear   F/u with PCP in 2-3 weeks  Provider: Dr. Olivia Mackie McLean-Scocuz[facility]

## 2017-12-14 ENCOUNTER — Other Ambulatory Visit: Payer: Self-pay | Admitting: Cardiovascular Disease

## 2017-12-15 ENCOUNTER — Other Ambulatory Visit: Payer: Self-pay | Admitting: Cardiovascular Disease

## 2017-12-15 NOTE — Telephone Encounter (Signed)
Can you please advise if instructions are correct for this medication.  "  Take 1 tablet (40 mg total) by mouth daily at 6 PM. Take 40 mg alt with 20 mg daily.     Do not see these instructions in his note. Patient was also last seen 12/2016 and is due for an appointment.

## 2017-12-16 NOTE — Telephone Encounter (Signed)
This is correct instructions.

## 2017-12-18 DIAGNOSIS — N138 Other obstructive and reflux uropathy: Secondary | ICD-10-CM | POA: Diagnosis not present

## 2017-12-18 DIAGNOSIS — N401 Enlarged prostate with lower urinary tract symptoms: Secondary | ICD-10-CM | POA: Diagnosis not present

## 2017-12-24 ENCOUNTER — Telehealth: Payer: Self-pay

## 2017-12-24 NOTE — Telephone Encounter (Signed)
LMTCB. PEC may speak with pt.  

## 2017-12-24 NOTE — Telephone Encounter (Signed)
Copied from [place]. Topic: Appointment Scheduling - Scheduling Inquiry for Clinic >> Dec 24, 2017  1:54 PM Margot Ables wrote: Reason for CRM: appt 4/29 is cancelled. Pt wife cannot bring him out 4x during the same week.   wife called to reschedule, nothing available until 01/29/18, pt not having any symptoms that he was having at last OV, please call to advise if appt is needed

## 2017-12-24 NOTE — Telephone Encounter (Signed)
No he does not need to reschedule at this time

## 2017-12-24 NOTE — Telephone Encounter (Signed)
Please advise 

## 2017-12-28 NOTE — Telephone Encounter (Signed)
Pt wife is aware he husband does not need to resc at this time

## 2017-12-29 ENCOUNTER — Ambulatory Visit: Payer: Self-pay | Admitting: Cardiovascular Disease

## 2018-01-04 ENCOUNTER — Ambulatory Visit: Payer: Self-pay | Admitting: Internal Medicine

## 2018-01-06 NOTE — Progress Notes (Signed)
Cardiology Office Note  Date:  01/07/2018   ID:  Edwin, Woodward [birth year], MRN [id number]  PCP:  [name], MD   Chief Complaint  Patient presents with  . other    12 month follow up. Meds reviewed by the pt. verbally. "doing well."     HPI:  [name] is a very pleasant 67 year-old gentleman who has a history of  coronary artery disease,  bare-metal stents placed to his proximal RCA and LAD in April 2009, Gout episodes x 2 (2015, 2016) total knee replacements bilaterally,  hypertension,  hyperlipidemia,  anxiety ADHD  Frontal lobe dementia who presents for routine followup of his coronary artery disease  In follow-up today he reports he is doing well Walks daily, 1/2 to 1 mile Does lots of leg lifts  Previous fall May 2018, dislocation of shoulder with fracture Long recovery over the summer requiring extensive physical therapy  Recent lab work reviewed with him HCT 27.9 CMP is good Lipids has been at goal for many years  Previously with low blood pressure from not drinking much fluids Metoprolol held previously for bradycardia rate 49 bpm  EKG personally reviewed by myself on todays visit Shows normal sinus rhythm rate 73 bpm right bundle branch block, left anterior fascicular block No significant change   PMH:   has a past medical history of CAD (coronary artery disease), Cardiovascular stress test abnormal, Hyperlipidemia, Hypertension, Presence of bare metal stent in right coronary artery, and Trigger finger.  PSH:    Past Surgical History:  Procedure Laterality Date  . APPENDECTOMY    . APPENDECTOMY    . CARPAL TUNNEL RELEASE    . CATARACT EXTRACTION     right  . CORONARY ANGIOPLASTY  12/2007  . CORONARY ARTERY BYPASS GRAFT     2 vessel  . JOINT REPLACEMENT     total knee  . REPLACEMENT TOTAL KNEE    . TONSILLECTOMY    . VASECTOMY      Current Outpatient Medications  Medication Sig Dispense Refill  . alfuzosin (UROXATRAL) 10 MG  24 hr tablet Take 1 tablet (10 mg total) by mouth daily. 90 tablet 1  . aspirin EC 81 MG tablet Take 1 tablet (81 mg total) by mouth daily. 90 tablet 3  . atorvastatin (LIPITOR) 40 MG tablet TAKE 1 TABLET (40 MG TOTAL) BY MOUTH DAILY AT 6 PM. TAKE 40 MG ALT WITH 20 MG DAILY. 90 tablet 2  . buPROPion (WELLBUTRIN SR) 150 MG 12 hr tablet TAKE 1 TABLET BY MOUTH TWICE A DAY 60 tablet 6  . calcium carbonate 1250 MG capsule Take 1,250 mg by mouth 2 (two) times daily with a meal.    . citalopram (CELEXA) 20 MG tablet Take 30 mg by mouth daily.    . clindamycin (CLEOCIN) 300 MG capsule TAKE 2 CAPSULES ONE HOUR BEFORE DENTAL PROCEDURE 2 capsule 0  . clopidogrel (PLAVIX) 75 MG tablet TAKE 1 TABLET (75 MG TOTAL) BY MOUTH DAILY. 90 tablet 1  . diclofenac sodium (VOLTAREN) 1 % GEL Apply 1 application topically as needed.    . donepezil (ARICEPT) 10 MG tablet Take 10 mg by mouth at bedtime. Pt takes 5mg  in the morning and 5mg  at night    . fluticasone (FLONASE) 50 MCG/ACT nasal spray Place 2 sprays into both nostrils daily. 16 g 6  . magnesium oxide (MAG-OX) 400 MG tablet TAKE 1 TABLET (400 MG TOTAL) BY MOUTH 2 (TWO) TIMES DAILY AFTER A MEAL  60 tablet 3  . nitroGLYCERIN (NITROSTAT) 0.4 MG SL tablet Place 1 tablet (0.4 mg total) under the tongue every 5 (five) minutes as needed. 90 tablet 3  . nystatin (MYCOSTATIN/NYSTOP) powder Apply topically.    Marland Kitchen olopatadine (PATANOL) 0.1 % ophthalmic solution Place 1 drop into both eyes 2 (two) times daily. 5 mL 12  . risperiDONE (RISPERDAL) 0.25 MG tablet Take 0.25 mg by mouth 2 (two) times daily.      No current facility-administered medications for this visit.      Allergies:   Biaxin [clarithromycin]; Clarithromycin; Codeine; Penicillins; Promethazine; Iodinated diagnostic agents; and Metrizamide   Social History:  The patient  reports that he has quit smoking. He has never used smokeless tobacco. He reports that he does not drink alcohol or use drugs.   Family  History:   family history includes Diabetes in his mother; Heart disease in his father; Hypertension in his mother; Thalassemia in his mother.    Review of Systems: Review of Systems  Constitutional: Negative.   Respiratory: Negative.   Cardiovascular: Negative.   Gastrointestinal: Negative.   Musculoskeletal: Negative.        Gait instability  Neurological: Negative.   Psychiatric/Behavioral: Positive for memory loss.  All other systems reviewed and are negative.    PHYSICAL EXAM: VS:  BP 100/60 (BP Location: Left Arm, Patient Position: Sitting, Cuff Size: Normal)   Pulse 73   Ht 5\' 3"  (1.6 m)   Wt 179 lb 4 oz (81.3 kg)   BMI 31.75 kg/m  , BMI Body mass index is 31.75 kg/m. Constitutional:  oriented to person, place, and time. No distress.  HENT:  Head: Normocephalic and atraumatic.  Eyes:  no discharge. No scleral icterus.  Neck: Normal range of motion. Neck supple. No JVD present.  Cardiovascular: Normal rate, regular rhythm, normal heart sounds and intact distal pulses. Exam reveals no gallop and no friction rub. No edema No murmur heard. Pulmonary/Chest: Effort normal and breath sounds normal. No stridor. No respiratory distress.  no wheezes.  no rales.  no tenderness.  Abdominal: Soft.  no distension.  no tenderness.  Musculoskeletal: Normal range of motion.  no  tenderness or deformity.  Neurological:  normal muscle tone. Coordination normal. No atrophy Skin: Skin is warm and dry. No rash noted. not diaphoretic.  Psychiatric:  normal mood and affect. behavior is normal. Thought content normal.     Recent Labs: 12/04/2017: ALT 20; BUN 25; Creat 1.12; Hemoglobin 9.1; Platelets 363; Potassium 4.1; Sodium 142; TSH 1.68    Lipid Panel Lab Results  Component Value Date   CHOL 104 11/14/2016   HDL 35 (L) 11/14/2016   LDLCALC 42 11/14/2016   TRIG 134 11/14/2016      Wt Readings from Last 3 Encounters:  01/07/18 179 lb 4 oz (81.3 kg)  12/04/17 176 lb 12.8 oz  (80.2 kg)  06/22/17 174 lb 12.8 oz (79.3 kg)       ASSESSMENT AND PLAN:  Pure hypercholesterolemia - Plan: EKG 12-Lead, Testosterone Cholesterol is at goal on the current lipid regimen. No changes to the medications were made.  Stable  Hypotension/orthostasis Blood pressure is well controlled on today's visit. No changes made to the medications. Recommended he increase his fluid intake  Atherosclerosis of native coronary artery without angina pectoris, unspecified whether native or transplanted heart - Currently with no symptoms of angina. No further workup at this time. Continue current medication regimen.  Still exercising  Class 1 obesity due to excess  calories with serious comorbidity and body mass index (BMI) of 31.0 to 31.9 in adult -  Weight stable but does regular exercise Recommended he continue his aggressive walking regimen  Bradycardia Resolved after stopping beta-blocker  GAD (generalized anxiety disorder) - Seems to be doing better on a regular walking program  CKD (chronic kidney disease) stage 3, GFR 30-59 ml/min - Plan: EKG 12-Lead,  stable renal function  Memory loss Still not driving, requires assistance from ex-wife,  friends and family  Disposition:   F/U  12 months   Total encounter time more than 25 minutes  Greater than 50% was spent in counseling and coordination of care with the patient    Orders Placed This Encounter  Procedures  . EKG 12-Lead     Signed, Esmond Plants, M.D., Ph.D. 01/07/2018  [place], [place]

## 2018-01-07 ENCOUNTER — Ambulatory Visit: Payer: Medicare HMO | Admitting: Cardiovascular Disease

## 2018-01-07 ENCOUNTER — Encounter: Payer: Self-pay | Admitting: Cardiovascular Disease

## 2018-01-07 VITALS — BP 100/60 | HR 73 | Ht 63.0 in | Wt 179.2 lb

## 2018-01-07 DIAGNOSIS — I10 Essential (primary) hypertension: Secondary | ICD-10-CM | POA: Diagnosis not present

## 2018-01-07 DIAGNOSIS — E7800 Pure hypercholesterolemia, unspecified: Secondary | ICD-10-CM

## 2018-01-07 DIAGNOSIS — R69 Illness, unspecified: Secondary | ICD-10-CM | POA: Diagnosis not present

## 2018-01-07 DIAGNOSIS — R413 Other amnesia: Secondary | ICD-10-CM | POA: Diagnosis not present

## 2018-01-07 DIAGNOSIS — G4733 Obstructive sleep apnea (adult) (pediatric): Secondary | ICD-10-CM

## 2018-01-07 DIAGNOSIS — F411 Generalized anxiety disorder: Secondary | ICD-10-CM | POA: Diagnosis not present

## 2018-01-07 DIAGNOSIS — I25118 Atherosclerotic heart disease of native coronary artery with other forms of angina pectoris: Secondary | ICD-10-CM

## 2018-01-07 DIAGNOSIS — N183 Chronic kidney disease, stage 3 unspecified: Secondary | ICD-10-CM

## 2018-01-07 DIAGNOSIS — Z9989 Dependence on other enabling machines and devices: Secondary | ICD-10-CM

## 2018-01-07 MED ORDER — CLOPIDOGREL BISULFATE 75 MG PO TABS: 75.0000 mg | ORAL_TABLET | Freq: Every day | ORAL | 3 refills | Status: DC

## 2018-01-07 MED ORDER — ATORVASTATIN CALCIUM 40 MG PO TABS: 40.0000 mg | ORAL_TABLET | Freq: Every day | ORAL | 2 refills | Status: DC

## 2018-01-07 NOTE — Patient Instructions (Signed)

## 2018-01-10 ENCOUNTER — Other Ambulatory Visit: Payer: Self-pay | Admitting: Internal Medicine

## 2018-01-11 NOTE — Telephone Encounter (Signed)
Uses before dental procedure  Last OV: 06/22/2017 Next OV: not scheduled

## 2018-03-23 DIAGNOSIS — H2512 Age-related nuclear cataract, left eye: Secondary | ICD-10-CM | POA: Diagnosis not present

## 2018-03-23 DIAGNOSIS — Z961 Presence of intraocular lens: Secondary | ICD-10-CM | POA: Diagnosis not present

## 2018-04-22 ENCOUNTER — Other Ambulatory Visit: Payer: Self-pay | Admitting: Internal Medicine

## 2018-05-30 ENCOUNTER — Other Ambulatory Visit: Payer: Self-pay | Admitting: Cardiovascular Disease

## 2018-06-25 ENCOUNTER — Inpatient Hospital Stay: Payer: Medicare HMO

## 2018-06-25 ENCOUNTER — Inpatient Hospital Stay
Admission: EM | Admit: 2018-06-25 | Discharge: 2018-07-01 | DRG: 208 | Disposition: A | Discharge status: Home Health | Payer: Medicare HMO | Attending: Internal Medicine | Admitting: Internal Medicine

## 2018-06-25 ENCOUNTER — Emergency Department: Payer: Medicare HMO

## 2018-06-25 ENCOUNTER — Other Ambulatory Visit: Payer: Self-pay

## 2018-06-25 ENCOUNTER — Encounter: Payer: Self-pay | Admitting: Cardiovascular Disease

## 2018-06-25 DIAGNOSIS — J9811 Atelectasis: Secondary | ICD-10-CM | POA: Diagnosis not present

## 2018-06-25 DIAGNOSIS — Z9989 Dependence on other enabling machines and devices: Secondary | ICD-10-CM

## 2018-06-25 DIAGNOSIS — K640 First degree hemorrhoids: Secondary | ICD-10-CM | POA: Diagnosis present

## 2018-06-25 DIAGNOSIS — N179 Acute kidney failure, unspecified: Secondary | ICD-10-CM | POA: Diagnosis not present

## 2018-06-25 DIAGNOSIS — J90 Pleural effusion, not elsewhere classified: Secondary | ICD-10-CM | POA: Diagnosis not present

## 2018-06-25 DIAGNOSIS — R579 Shock, unspecified: Secondary | ICD-10-CM | POA: Diagnosis not present

## 2018-06-25 DIAGNOSIS — K922 Gastrointestinal hemorrhage, unspecified: Secondary | ICD-10-CM | POA: Diagnosis not present

## 2018-06-25 DIAGNOSIS — K920 Hematemesis: Secondary | ICD-10-CM | POA: Diagnosis not present

## 2018-06-25 DIAGNOSIS — Z955 Presence of coronary angioplasty implant and graft: Secondary | ICD-10-CM

## 2018-06-25 DIAGNOSIS — K219 Gastro-esophageal reflux disease without esophagitis: Secondary | ICD-10-CM | POA: Diagnosis not present

## 2018-06-25 DIAGNOSIS — K921 Melena: Secondary | ICD-10-CM | POA: Diagnosis present

## 2018-06-25 DIAGNOSIS — K4090 Unilateral inguinal hernia, without obstruction or gangrene, not specified as recurrent: Secondary | ICD-10-CM | POA: Diagnosis present

## 2018-06-25 DIAGNOSIS — Z832 Family history of diseases of the blood and blood-forming organs and certain disorders involving the immune mechanism: Secondary | ICD-10-CM

## 2018-06-25 DIAGNOSIS — R0602 Shortness of breath: Secondary | ICD-10-CM

## 2018-06-25 DIAGNOSIS — I129 Hypertensive chronic kidney disease with stage 1 through stage 4 chronic kidney disease, or unspecified chronic kidney disease: Secondary | ICD-10-CM | POA: Diagnosis not present

## 2018-06-25 DIAGNOSIS — D62 Acute posthemorrhagic anemia: Secondary | ICD-10-CM | POA: Diagnosis present

## 2018-06-25 DIAGNOSIS — Z95828 Presence of other vascular implants and grafts: Secondary | ICD-10-CM

## 2018-06-25 DIAGNOSIS — R531 Weakness: Secondary | ICD-10-CM | POA: Diagnosis not present

## 2018-06-25 DIAGNOSIS — N183 Chronic kidney disease, stage 3 (moderate): Secondary | ICD-10-CM | POA: Diagnosis not present

## 2018-06-25 DIAGNOSIS — I25118 Atherosclerotic heart disease of native coronary artery with other forms of angina pectoris: Secondary | ICD-10-CM

## 2018-06-25 DIAGNOSIS — Z951 Presence of aortocoronary bypass graft: Secondary | ICD-10-CM

## 2018-06-25 DIAGNOSIS — G4733 Obstructive sleep apnea (adult) (pediatric): Secondary | ICD-10-CM | POA: Diagnosis not present

## 2018-06-25 DIAGNOSIS — Z885 Allergy status to narcotic agent status: Secondary | ICD-10-CM

## 2018-06-25 DIAGNOSIS — G9389 Other specified disorders of brain: Secondary | ICD-10-CM | POA: Diagnosis not present

## 2018-06-25 DIAGNOSIS — E785 Hyperlipidemia, unspecified: Secondary | ICD-10-CM | POA: Diagnosis present

## 2018-06-25 DIAGNOSIS — Z9841 Cataract extraction status, right eye: Secondary | ICD-10-CM

## 2018-06-25 DIAGNOSIS — R571 Hypovolemic shock: Secondary | ICD-10-CM | POA: Diagnosis present

## 2018-06-25 DIAGNOSIS — I468 Cardiac arrest due to other underlying condition: Secondary | ICD-10-CM | POA: Diagnosis not present

## 2018-06-25 DIAGNOSIS — Z96653 Presence of artificial knee joint, bilateral: Secondary | ICD-10-CM | POA: Diagnosis present

## 2018-06-25 DIAGNOSIS — K626 Ulcer of anus and rectum: Secondary | ICD-10-CM | POA: Diagnosis present

## 2018-06-25 DIAGNOSIS — Z23 Encounter for immunization: Secondary | ICD-10-CM | POA: Diagnosis not present

## 2018-06-25 DIAGNOSIS — Z79899 Other long term (current) drug therapy: Secondary | ICD-10-CM

## 2018-06-25 DIAGNOSIS — G3109 Other frontotemporal dementia: Secondary | ICD-10-CM | POA: Diagnosis present

## 2018-06-25 DIAGNOSIS — I213 ST elevation (STEMI) myocardial infarction of unspecified site: Secondary | ICD-10-CM | POA: Diagnosis not present

## 2018-06-25 DIAGNOSIS — J9601 Acute respiratory failure with hypoxia: Secondary | ICD-10-CM | POA: Diagnosis present

## 2018-06-25 DIAGNOSIS — Z7902 Long term (current) use of antithrombotics/antiplatelets: Secondary | ICD-10-CM

## 2018-06-25 DIAGNOSIS — K259 Gastric ulcer, unspecified as acute or chronic, without hemorrhage or perforation: Secondary | ICD-10-CM | POA: Diagnosis not present

## 2018-06-25 DIAGNOSIS — I469 Cardiac arrest, cause unspecified: Secondary | ICD-10-CM | POA: Diagnosis not present

## 2018-06-25 DIAGNOSIS — Z87891 Personal history of nicotine dependence: Secondary | ICD-10-CM

## 2018-06-25 DIAGNOSIS — E872 Acidosis: Secondary | ICD-10-CM | POA: Diagnosis not present

## 2018-06-25 DIAGNOSIS — I252 Old myocardial infarction: Secondary | ICD-10-CM

## 2018-06-25 DIAGNOSIS — R578 Other shock: Secondary | ICD-10-CM | POA: Diagnosis not present

## 2018-06-25 DIAGNOSIS — I959 Hypotension, unspecified: Secondary | ICD-10-CM | POA: Diagnosis not present

## 2018-06-25 DIAGNOSIS — R55 Syncope and collapse: Secondary | ICD-10-CM | POA: Diagnosis not present

## 2018-06-25 DIAGNOSIS — Z880 Allergy status to penicillin: Secondary | ICD-10-CM

## 2018-06-25 DIAGNOSIS — Z4682 Encounter for fitting and adjustment of non-vascular catheter: Secondary | ICD-10-CM | POA: Diagnosis not present

## 2018-06-25 DIAGNOSIS — Z881 Allergy status to other antibiotic agents status: Secondary | ICD-10-CM

## 2018-06-25 DIAGNOSIS — F419 Anxiety disorder, unspecified: Secondary | ICD-10-CM | POA: Diagnosis present

## 2018-06-25 DIAGNOSIS — Z7951 Long term (current) use of inhaled steroids: Secondary | ICD-10-CM

## 2018-06-25 DIAGNOSIS — I452 Bifascicular block: Secondary | ICD-10-CM | POA: Diagnosis present

## 2018-06-25 DIAGNOSIS — F0280 Dementia in other diseases classified elsewhere without behavioral disturbance: Secondary | ICD-10-CM | POA: Diagnosis present

## 2018-06-25 DIAGNOSIS — R000 Tachycardia, unspecified: Secondary | ICD-10-CM | POA: Diagnosis not present

## 2018-06-25 DIAGNOSIS — Z888 Allergy status to other drugs, medicaments and biological substances status: Secondary | ICD-10-CM

## 2018-06-25 DIAGNOSIS — Z833 Family history of diabetes mellitus: Secondary | ICD-10-CM

## 2018-06-25 DIAGNOSIS — Z9049 Acquired absence of other specified parts of digestive tract: Secondary | ICD-10-CM

## 2018-06-25 DIAGNOSIS — J680 Bronchitis and pneumonitis due to chemicals, gases, fumes and vapors: Principal | ICD-10-CM | POA: Diagnosis present

## 2018-06-25 DIAGNOSIS — Z7982 Long term (current) use of aspirin: Secondary | ICD-10-CM

## 2018-06-25 DIAGNOSIS — Z9089 Acquired absence of other organs: Secondary | ICD-10-CM

## 2018-06-25 DIAGNOSIS — K254 Chronic or unspecified gastric ulcer with hemorrhage: Secondary | ICD-10-CM | POA: Diagnosis not present

## 2018-06-25 DIAGNOSIS — E876 Hypokalemia: Secondary | ICD-10-CM | POA: Diagnosis present

## 2018-06-25 DIAGNOSIS — I200 Unstable angina: Secondary | ICD-10-CM | POA: Diagnosis not present

## 2018-06-25 DIAGNOSIS — R42 Dizziness and giddiness: Secondary | ICD-10-CM | POA: Diagnosis not present

## 2018-06-25 DIAGNOSIS — Z91041 Radiographic dye allergy status: Secondary | ICD-10-CM

## 2018-06-25 DIAGNOSIS — Z8249 Family history of ischemic heart disease and other diseases of the circulatory system: Secondary | ICD-10-CM

## 2018-06-25 DIAGNOSIS — R0789 Other chest pain: Secondary | ICD-10-CM | POA: Diagnosis not present

## 2018-06-25 DIAGNOSIS — R079 Chest pain, unspecified: Secondary | ICD-10-CM | POA: Diagnosis not present

## 2018-06-25 LAB — CBC WITH DIFFERENTIAL/PLATELET
ABS IMMATURE GRANULOCYTES: 0.07 10*3/uL (ref 0.00–0.07)
ABS IMMATURE GRANULOCYTES: 0.11 10*3/uL — AB (ref 0.00–0.07)
Basophils Absolute: 0 10*3/uL (ref 0.0–0.1)
Basophils Absolute: 0 10*3/uL (ref 0.0–0.1)
Basophils Relative: 0 %
Basophils Relative: 0 %
EOS PCT: 1 %
Eosinophils Absolute: 0.1 10*3/uL (ref 0.0–0.5)
Eosinophils Absolute: 0 10*3/uL (ref 0.0–0.5)
Eosinophils Relative: 0 %
HCT: 33.8 % — ABNORMAL LOW (ref 39.0–52.0)
HCT: 38.5 % — ABNORMAL LOW (ref 39.0–52.0)
HEMOGLOBIN: 10.6 g/dL — AB (ref 13.0–17.0)
Hemoglobin: 12.6 g/dL — ABNORMAL LOW (ref 13.0–17.0)
IMMATURE GRANULOCYTES: 1 %
IMMATURE GRANULOCYTES: 1 %
LYMPHS ABS: 1.1 10*3/uL (ref 0.7–4.0)
LYMPHS ABS: 0.9 10*3/uL (ref 0.7–4.0)
LYMPHS PCT: 9 %
Lymphocytes Relative: 5 %
MCH: 21 pg — ABNORMAL LOW (ref 26.0–34.0)
MCH: 23.3 pg — ABNORMAL LOW (ref 26.0–34.0)
MCHC: 31.4 g/dL (ref 30.0–36.0)
MCHC: 32.7 g/dL (ref 30.0–36.0)
MCV: 66.9 fL — ABNORMAL LOW (ref 80.0–100.0)
MCV: 71.3 fL — ABNORMAL LOW (ref 80.0–100.0)
MONO ABS: 0.5 10*3/uL (ref 0.1–1.0)
MONO ABS: 0.6 10*3/uL (ref 0.1–1.0)
Monocytes Relative: 4 %
Monocytes Relative: 3 %
NEUTROS ABS: 10.5 10*3/uL — AB (ref 1.7–7.7)
Neutro Abs: 16.2 10*3/uL — ABNORMAL HIGH (ref 1.7–7.7)
Neutrophils Relative %: 85 %
Neutrophils Relative %: 91 %
Platelets: 278 10*3/uL (ref 150–400)
Platelets: 250 10*3/uL (ref 150–400)
RBC: 5.05 MIL/uL (ref 4.22–5.81)
RBC: 5.4 MIL/uL (ref 4.22–5.81)
RDW: 16.2 % — ABNORMAL HIGH (ref 11.5–15.5)
RDW: 20.8 % — ABNORMAL HIGH (ref 11.5–15.5)
WBC: 12.4 10*3/uL — ABNORMAL HIGH (ref 4.0–10.5)
WBC: 17.9 10*3/uL — ABNORMAL HIGH (ref 4.0–10.5)
nRBC: 0.3 % — ABNORMAL HIGH (ref 0.0–0.2)

## 2018-06-25 LAB — BLOOD GAS, ARTERIAL
Acid-base deficit: 5.9 mmol/L — ABNORMAL HIGH (ref 0.0–2.0)
Acid-base deficit: 3 mmol/L — ABNORMAL HIGH (ref 0.0–2.0)
BICARBONATE: 21.5 mmol/L (ref 20.0–28.0)
BICARBONATE: 21.1 mmol/L (ref 20.0–28.0)
FIO2: 40
FIO2: 40
LHR: 24 {breaths}/min
MECHVT: 450 mL
O2 SAT: 99 %
O2 Saturation: 97.6 %
PATIENT TEMPERATURE: 37
PCO2 ART: 34 mmHg (ref 32.0–48.0)
PEEP/CPAP: 5 cmH2O
PEEP: 5 cmH2O
PO2 ART: 131 mmHg — AB (ref 83.0–108.0)
Patient temperature: 37
RATE: 18 resp/min
VT: 500 mL
pCO2 arterial: 49 mmHg — ABNORMAL HIGH (ref 32.0–48.0)
pH, Arterial: 7.25 — ABNORMAL LOW (ref 7.350–7.450)
pH, Arterial: 7.4 (ref 7.350–7.450)
pO2, Arterial: 113 mmHg — ABNORMAL HIGH (ref 83.0–108.0)

## 2018-06-25 LAB — URINE DRUG SCREEN, QUALITATIVE (ARMC ONLY)
AMPHETAMINES, UR SCREEN: NOT DETECTED
Barbiturates, Ur Screen: NOT DETECTED
Benzodiazepine, Ur Scrn: POSITIVE — AB
Cannabinoid 50 Ng, Ur ~~LOC~~: NOT DETECTED
Cocaine Metabolite,Ur ~~LOC~~: NOT DETECTED
MDMA (ECSTASY) UR SCREEN: NOT DETECTED
METHADONE SCREEN, URINE: NOT DETECTED
Opiate, Ur Screen: NOT DETECTED
Phencyclidine (PCP) Ur S: NOT DETECTED
TRICYCLIC, UR SCREEN: NOT DETECTED

## 2018-06-25 LAB — COMPREHENSIVE METABOLIC PANEL
ALBUMIN: 3.8 g/dL (ref 3.5–5.0)
ALK PHOS: 63 U/L (ref 38–126)
ALT: 25 U/L (ref 0–44)
AST: 25 U/L (ref 15–41)
Anion gap: 11 (ref 5–15)
BILIRUBIN TOTAL: 1 mg/dL (ref 0.3–1.2)
BUN: 57 mg/dL — AB (ref 8–23)
CALCIUM: 9.1 mg/dL (ref 8.9–10.3)
CO2: 22 mmol/L (ref 22–32)
CREATININE: 1.2 mg/dL (ref 0.61–1.24)
Chloride: 105 mmol/L (ref 98–111)
GFR calc Af Amer: >60 mL/min (ref >60)
GFR calc non Af Amer: >60 mL/min (ref >60)
Glucose, Bld: 141 mg/dL — ABNORMAL HIGH (ref 70–99)
Potassium: 4.6 mmol/L (ref 3.5–5.1)
Sodium: 138 mmol/L (ref 135–145)
Total Protein: 6.7 g/dL (ref 6.5–8.1)

## 2018-06-25 LAB — HEPATIC FUNCTION PANEL
ALBUMIN: 3.5 g/dL (ref 3.5–5.0)
ALT: 23 U/L (ref 0–44)
AST: 24 U/L (ref 15–41)
Alkaline Phosphatase: 61 U/L (ref 38–126)
Bilirubin, Direct: <0.1 mg/dL (ref 0.0–0.2)
Total Bilirubin: 0.9 mg/dL (ref 0.3–1.2)
Total Protein: 6.5 g/dL (ref 6.5–8.1)

## 2018-06-25 LAB — CBC
HCT: 36.3 % — ABNORMAL LOW (ref 39.0–52.0)
Hemoglobin: 12 g/dL — ABNORMAL LOW (ref 13.0–17.0)
MCH: 23.3 pg — AB (ref 26.0–34.0)
MCHC: 33.1 g/dL (ref 30.0–36.0)
MCV: 70.6 fL — AB (ref 80.0–100.0)
PLATELETS: 227 10*3/uL (ref 150–400)
RBC: 5.14 MIL/uL (ref 4.22–5.81)
RDW: 20.1 % — AB (ref 11.5–15.5)
WBC: 16.9 10*3/uL — ABNORMAL HIGH (ref 4.0–10.5)
nRBC: 0 % (ref 0.0–0.2)

## 2018-06-25 LAB — TROPONIN I
TROPONIN I: 0.03 ng/mL — AB (ref <0.03)
Troponin I: <0.03 ng/mL (ref <0.03)

## 2018-06-25 LAB — LACTIC ACID, PLASMA
Lactic Acid, Venous: 2.1 mmol/L (ref 0.5–1.9)
Lactic Acid, Venous: 1.4 mmol/L (ref 0.5–1.9)

## 2018-06-25 LAB — PREPARE RBC (CROSSMATCH)

## 2018-06-25 LAB — PROTIME-INR
INR: 1.21
PROTHROMBIN TIME: 15.2 s (ref 11.4–15.2)

## 2018-06-25 LAB — BRAIN NATRIURETIC PEPTIDE: B Natriuretic Peptide: 17 pg/mL (ref 0.0–100.0)

## 2018-06-25 LAB — FIBRIN DERIVATIVES D-DIMER (ARMC ONLY): Fibrin derivatives D-dimer (ARMC): 301.19 ng/mL (FEU) (ref 0.00–499.00)

## 2018-06-25 LAB — MASSIVE TRANSFUSION PROTOCOL ORDER (BLOOD BANK NOTIFICATION)

## 2018-06-25 LAB — GLUCOSE, CAPILLARY: Glucose-Capillary: 131 mg/dL — ABNORMAL HIGH (ref 70–99)

## 2018-06-25 LAB — HEMOGLOBIN: Hemoglobin: 12.2 g/dL — ABNORMAL LOW (ref 13.0–17.0)

## 2018-06-25 LAB — APTT: aPTT: 27 seconds (ref 24–36)

## 2018-06-25 LAB — ABO/RH: ABO/RH(D): B POS

## 2018-06-25 LAB — TSH
TSH: 2.248 u[IU]/mL (ref 0.350–4.500)
TSH: 1.031 u[IU]/mL (ref 0.350–4.500)

## 2018-06-25 LAB — FIBRINOGEN: FIBRINOGEN: 321 mg/dL (ref 210–475)

## 2018-06-25 LAB — TRIGLYCERIDES: Triglycerides: 75 mg/dL (ref <150)

## 2018-06-25 LAB — MRSA PCR SCREENING: MRSA by PCR: NEGATIVE

## 2018-06-25 MED ORDER — METHYLPREDNISOLONE SODIUM SUCC 40 MG IJ SOLR
40.0000 mg | Freq: Once | INTRAMUSCULAR | Status: AC
  Administered 2018-06-25: 40 mg via INTRAVENOUS
  Filled 2018-06-25: qty 1

## 2018-06-25 MED ORDER — KETAMINE HCL 10 MG/ML IJ SOLN
100.0000 mg | Freq: Once | INTRAMUSCULAR | Status: AC
  Administered 2018-06-25: 100 mg via INTRAVENOUS

## 2018-06-25 MED ORDER — IPRATROPIUM-ALBUTEROL 0.5-2.5 (3) MG/3ML IN SOLN
3.0000 mL | Freq: Four times a day (QID) | RESPIRATORY_TRACT | Status: DC
  Administered 2018-06-25 – 2018-06-27 (×10): 3 mL via RESPIRATORY_TRACT
  Filled 2018-06-25 (×8): qty 3

## 2018-06-25 MED ORDER — ALBUTEROL SULFATE (2.5 MG/3ML) 0.083% IN NEBU: 2.5000 mg | INHALATION_SOLUTION | RESPIRATORY_TRACT | Status: DC | PRN

## 2018-06-25 MED ORDER — PANTOPRAZOLE SODIUM 40 MG IV SOLR: 40.0000 mg | Freq: Two times a day (BID) | INTRAVENOUS | Status: DC

## 2018-06-25 MED ORDER — SODIUM CHLORIDE 0.9 % IV SOLN
INTRAVENOUS | Status: DC
  Administered 2018-06-25 – 2018-06-28 (×5): via INTRAVENOUS

## 2018-06-25 MED ORDER — TRANEXAMIC ACID-NACL 1000-0.7 MG/100ML-% IV SOLN: 1000.0000 mg | Freq: Once | INTRAVENOUS | Status: DC

## 2018-06-25 MED ORDER — FENTANYL CITRATE (PF) 100 MCG/2ML IJ SOLN
150.0000 ug | Freq: Once | INTRAMUSCULAR | Status: AC
  Administered 2018-06-25: 150 ug via INTRAVENOUS

## 2018-06-25 MED ORDER — ONDANSETRON HCL 4 MG/2ML IJ SOLN
4.0000 mg | Freq: Once | INTRAMUSCULAR | Status: AC
  Administered 2018-06-25: 4 mg via INTRAVENOUS

## 2018-06-25 MED ORDER — ORAL CARE MOUTH RINSE
15.0000 mL | OROMUCOSAL | Status: DC
  Administered 2018-06-25 – 2018-06-26 (×7): 15 mL via OROMUCOSAL

## 2018-06-25 MED ORDER — PROPOFOL 1000 MG/100ML IV EMUL
5.0000 ug/kg/min | INTRAVENOUS | Status: DC
  Administered 2018-06-25 (×2): 20 ug/kg/min via INTRAVENOUS
  Administered 2018-06-26: 25 ug/kg/min via INTRAVENOUS
  Filled 2018-06-25 (×3): qty 100

## 2018-06-25 MED ORDER — TRANEXAMIC ACID-NACL 1000-0.7 MG/100ML-% IV SOLN
1000.0000 mg | Freq: Once | INTRAVENOUS | Status: AC
  Administered 2018-06-25: 1000 mg via INTRAVENOUS
  Filled 2018-06-25: qty 100

## 2018-06-25 MED ORDER — PANTOPRAZOLE SODIUM 40 MG IV SOLR
40.0000 mg | Freq: Once | INTRAVENOUS | Status: DC
  Filled 2018-06-25: qty 40

## 2018-06-25 MED ORDER — SODIUM CHLORIDE 0.9 % IV SOLN
INTRAVENOUS | Status: DC
  Administered 2018-06-25: 10:00:00 via INTRAVENOUS

## 2018-06-25 MED ORDER — FENTANYL 2500MCG IN NS 250ML (10MCG/ML) PREMIX INFUSION
0.0000 ug/h | INTRAVENOUS | Status: DC
  Administered 2018-06-25 (×2): 100 ug/h via INTRAVENOUS
  Filled 2018-06-25 (×2): qty 250

## 2018-06-25 MED ORDER — ACETAMINOPHEN 650 MG RE SUPP: 650.0000 mg | Freq: Four times a day (QID) | RECTAL | Status: DC | PRN

## 2018-06-25 MED ORDER — FENTANYL 2500MCG IN NS 250ML (10MCG/ML) PREMIX INFUSION
INTRAVENOUS | Status: AC
  Filled 2018-06-25: qty 250

## 2018-06-25 MED ORDER — CHLORHEXIDINE GLUCONATE 0.12% ORAL RINSE (MEDLINE KIT)
15.0000 mL | Freq: Two times a day (BID) | OROMUCOSAL | Status: DC
  Administered 2018-06-25 – 2018-06-26 (×2): 15 mL via OROMUCOSAL

## 2018-06-25 MED ORDER — SODIUM CHLORIDE 0.9 % IV SOLN
5.0000 mg/h | INTRAVENOUS | Status: DC
  Administered 2018-06-25: 5 mg/h via INTRAVENOUS
  Filled 2018-06-25 (×2): qty 10

## 2018-06-25 MED ORDER — ONDANSETRON HCL 4 MG/2ML IJ SOLN
INTRAMUSCULAR | Status: AC
  Administered 2018-06-25: 4 mg via INTRAVENOUS
  Filled 2018-06-25: qty 2

## 2018-06-25 MED ORDER — IOPAMIDOL (ISOVUE-370) INJECTION 76%
100.0000 mL | Freq: Once | INTRAVENOUS | Status: AC | PRN
  Administered 2018-06-25: 100 mL via INTRAVENOUS

## 2018-06-25 MED ORDER — NITROGLYCERIN 0.4 MG SL SUBL
0.4000 mg | SUBLINGUAL_TABLET | SUBLINGUAL | Status: DC | PRN
  Administered 2018-06-25: 0.4 mg via SUBLINGUAL
  Filled 2018-06-25: qty 1

## 2018-06-25 MED ORDER — MIDAZOLAM HCL 2 MG/2ML IJ SOLN
5.0000 mg | Freq: Once | INTRAMUSCULAR | Status: AC
  Administered 2018-06-25: 5 mg via INTRAVENOUS

## 2018-06-25 MED ORDER — NOREPINEPHRINE 4 MG/250ML-% IV SOLN
0.0000 ug/min | Freq: Once | INTRAVENOUS | Status: AC
  Administered 2018-06-25: 5 ug/min via INTRAVENOUS
  Filled 2018-06-25: qty 250

## 2018-06-25 MED ORDER — ACETAMINOPHEN 325 MG PO TABS
650.0000 mg | ORAL_TABLET | Freq: Four times a day (QID) | ORAL | Status: DC | PRN
  Administered 2018-06-30: 650 mg via ORAL
  Filled 2018-06-25: qty 2

## 2018-06-25 MED ORDER — ROCURONIUM BROMIDE 50 MG/5ML IV SOLN
70.0000 mg | Freq: Once | INTRAVENOUS | Status: AC
  Administered 2018-06-25: 70 mg via INTRAVENOUS
  Filled 2018-06-25: qty 7

## 2018-06-25 MED ORDER — SODIUM CHLORIDE 0.9 % IV SOLN
8.0000 mg/h | INTRAVENOUS | Status: DC
  Administered 2018-06-25 – 2018-06-26 (×2): 8 mg/h via INTRAVENOUS
  Filled 2018-06-25 (×3): qty 80

## 2018-06-25 NOTE — ED Notes (Signed)
Per Dr. Mable Paris CPR initiated for approx 5-10 seconds prior to this RN arrival to room.

## 2018-06-25 NOTE — Progress Notes (Signed)
CRITICAL VALUE ALERT  Critical Value:  Lactic 2.1, Troponin 0.03  Date & Time Notied:  06/25/2018 11:16  Provider Notified: Dr. Manuella Ghazi  Orders Received/Actions taken: Acknowledged, no new orders

## 2018-06-25 NOTE — Consult Note (Signed)
Cardiology Consultation:   Patient ID: Edwin Woodward MRN: [id number]; DOB: 11/03/1950  Admit date: 06/25/2018 Date of Consult: 06/25/2018  Primary Care Provider: [name], MD Primary Cardiologist: Dr. Rockey Situ Primary Electrophysiologist:  None    Patient Profile:   Edwin Woodward is a 67 y.o. male with a hx of CAD s/p BMS to [facility], LAD (12/2007), HTN, HLD, CKD III, frontal lobe dementia, GERD, bradycardia and orthostasis, gout episodes x2 (2015, 2016), total knee b/l replacements, anxiety, ADHD, 2018 shoulder dislocation with fracture who is being seen today for the evaluation of ?thought to be code STEMI at the request of Dr. Posey Pronto.  History of Present Illness:   Edwin Woodward is a 67 yo male with PMH as above. January 2010 stress test. Last seen by Dr. Rockey Situ 01/07/2018. EKG at that time NSR, 73 bpm, RBBB, LAFB.   He presented to the ED today 06/25/18 with c/o substernal pressure rated moderate in severity and starting ~9PM last night. He further described the pain as non-radiating, non-pleuritic and gradually increasing pressure, similar to "someone sitting on [his] chest" and made worse with exertion. Associated sx included SOB, nausea, pre-syncope and diaphoresis.   In the ED, he was found to be hypoxic (SpO2 88% ORA). Vitals: HR 105, RR 18, BP 99/87, 88% SpO2 Labs: Na 138, K 4.6, glucose 141, BUN 57, Cr 1.20 (baseline ~ 1.12), alk phos 63, AST 25, ALT 25 Troponin negative x1 EKG:108 bpm, QRS 137, QTc 435, RBBB, LAFB, poor r wave progression CXR:no active cardiopulmonary disease CT angio: results pending  Since in the ED, patient became bradycardic then diaphoretic and unresponsive with BP 70/48 / hypotensive. CPR initiated with 34m epi mixed in 500cc bag. 1 episode of hemoptysis with large clots noted. Per Dr. AFletcher Anonpresent at bedside and instructed no heparin. Administered ketamine and intubated after 2nd episode of hemoptysis.   He was transferred to the ICU same  day 10/18 and is intubated and sedated with pads attached.   *See A/P for additional history, provided by patient's ex-wife  Past Medical History:  Diagnosis Date  . CAD (coronary artery disease)   . Cardiovascular stress test abnormal    Jan 2010 no significant ischemia  . Hyperlipidemia   . Hypertension   . Presence of bare metal stent in right coronary artery    proximal RCA adn LAD in April 2009  . Trigger finger     Past Surgical History:  Procedure Laterality Date  . APPENDECTOMY    . APPENDECTOMY    . CARPAL TUNNEL RELEASE    . CATARACT EXTRACTION     right  . CORONARY ANGIOPLASTY  12/2007  . CORONARY ARTERY BYPASS GRAFT     2 vessel  . JOINT REPLACEMENT     total knee  . REPLACEMENT TOTAL KNEE    . TONSILLECTOMY    . VASECTOMY       Home Medications:  Prior to Admission medications   Medication Sig Start Date End Date Taking? Authorizing Provider  alfuzosin (UROXATRAL) 10 MG 24 hr tablet Take 1 tablet (10 mg total) by mouth daily. 05/23/16  Yes TCrecencio Mc MD  aspirin EC 81 MG tablet Take 1 tablet (81 mg total) by mouth daily. 08/15/15  Yes GMinna Merritts MD  atorvastatin (LIPITOR) 40 MG tablet Take 1 tablet (40 mg total) by mouth daily at 6 PM. Take 40 mg alt with 20 mg daily. 01/07/18  Yes GMinna Merritts MD  buPROPion ([facility])  150 MG 12 hr tablet TAKE 1 TABLET BY MOUTH TWICE A DAY 08/13/15  Yes Clapacs, Madie Reno, MD  calcium carbonate 1250 MG capsule Take 1,250 mg by mouth 2 (two) times daily with a meal.   Yes [provider]  citalopram (CELEXA) 20 MG tablet Take 30 mg by mouth daily. 03/20/16  Yes [provider]  clopidogrel (PLAVIX) 75 MG tablet Take 1 tablet (75 mg total) by mouth daily. 01/07/18  Yes Gollan, Kathlene November, MD  donepezil (ARICEPT) 5 MG tablet Take 5 mg by mouth 2 (two) times daily.  03/20/16  Yes [provider]  finasteride (PROSCAR) 5 MG tablet Take 5 mg by mouth daily.   Yes [provider]    magnesium oxide (MAG-OX) 400 MG tablet TAKE 1 TABLET (400 MG TOTAL) BY MOUTH 2 (TWO) TIMES DAILY AFTER A MEAL Patient taking differently: Take 400 mg by mouth 2 (two) times daily.  04/22/18  Yes [name], MD  nitroGLYCERIN (NITROSTAT) 0.4 MG SL tablet Place 1 tablet (0.4 mg total) under the tongue every 5 (five) minutes as needed. 04/11/11  Yes Minna Merritts, MD  risperiDONE (RISPERDAL) 0.25 MG tablet Take 0.25 mg by mouth 2 (two) times daily.  09/14/15 06/26/19 Yes [provider]  clopidogrel (PLAVIX) 75 MG tablet TAKE 1 TABLET (75 MG TOTAL) BY MOUTH DAILY. Patient not taking: Reported on 06/25/2018 05/31/18   Minna Merritts, MD  olopatadine (PATANOL) 0.1 % ophthalmic solution Place 1 drop into both eyes 2 (two) times daily. Patient not taking: Reported on 06/25/2018 06/22/17   [name], MD    Inpatient Medications: Scheduled Meds:  Continuous Infusions: . fentaNYL    . fentaNYL infusion INTRAVENOUS 100 mcg/hr (06/25/18 0735)  . norepinephrine (LEVOPHED) Adult infusion     PRN Meds: nitroGLYCERIN  Allergies:    Allergies  Allergen Reactions  . Biaxin [Clarithromycin]   . Clarithromycin   . Codeine     Sever vomiting   . Penicillins   . Promethazine   . Iodinated Diagnostic Agents Rash  . Metrizamide Rash    Social History:   Social History   Socioeconomic History  . Marital status: Married    Spouse name: Not on file  . Number of children: Not on file  . Years of education: Not on file  . Highest education level: Not on file  Occupational History  . Not on file  Social Needs  . Financial resource strain: Not on file  . Food insecurity:    Worry: Not on file    Inability: Not on file  . Transportation needs:    Medical: Not on file    Non-medical: Not on file  Tobacco Use  . Smoking status: Former Research scientist (life sciences)  . Smokeless tobacco: Never Used  Substance and Sexual Activity  . Alcohol use: No    Alcohol/week: 0.0 standard drinks  . Drug  use: No    Types: Marijuana  . Sexual activity: Not on file  Lifestyle  . Physical activity:    Days per week: Not on file    Minutes per session: Not on file  . Stress: Not on file  Relationships  . Social connections:    Talks on phone: Not on file    Gets together: Not on file    Attends religious service: Not on file    Active member of club or organization: Not on file    Attends meetings of clubs or organizations: Not on file  Relationship status: Not on file  . Intimate partner violence:    Fear of current or ex partner: Not on file    Emotionally abused: Not on file    Physically abused: Not on file    Forced sexual activity: Not on file  Other Topics Concern  . Not on file  Social History Narrative   Lives with wife.    Family History:    Family History  Problem Relation Age of Onset  . Hypertension Mother   . Diabetes Mother   . Thalassemia Mother   . Heart disease Father      ROS:  Please see the history of present illness.   All other ROS reviewed and negative.     Physical Exam/Data:   Vitals:   06/25/18 0710 06/25/18 0714 06/25/18 0715 06/25/18 0730  BP: (!) 70/48  (!) 82/57 (!) 154/94  Pulse: 100 92  (!) 131  Resp: _0 Temp:      TempSrc:      SpO2: 100% 100%  92%  Weight:      Height:       No intake or output data in the 24 hours ending 06/25/18 0743 Filed Weights   06/25/18 0650  Weight: 79.8 kg   Body mass index is 31.18 kg/m.  General:  Intubated. Sedated.  HEENT: normal Lymph: no adenopathy Neck: no JVD Cardiac:  normal S1, S2; RRR; no murmur  Lungs:  Intubated Abd: soft, nontender, no hepatomegaly  Ext: no edema Musculoskeletal:  No deformities Skin: warm and dry  Neuro:  no focal abnormalities noted Psych:  Sedated at time of exam  EKG: Refer to HPI Telemetry:  Telemetry was personally reviewed and demonstrates: NSR HR 60-130s  CV Studies:   Relevant CV Studies: None seen in Epic  Laboratory  Data:  Chemistry Recent Labs  Lab 06/25/18 0654  NA 138  K 4.6  CL 105  CO2 22  GLUCOSE 141*  BUN 57*  CREATININE 1.20  CALCIUM 9.1  GFRNONAA >60  GFRAA >60  ANIONGAP 11    Recent Labs  Lab 06/25/18 0654  PROT 6.7  ALBUMIN 3.8  AST 25  ALT 25  ALKPHOS 63  BILITOT 1.0   HematologyNo results for input(s): WBC, RBC, HGB, HCT, MCV, MCH, MCHC, RDW, PLT in the last 168 hours. Cardiac Enzymes Recent Labs  Lab 06/25/18 0654  TROPONINI <0.03   No results for input(s): TROPIPOC in the last 168 hours.  BNPNo results for input(s): BNP, PROBNP in the last 168 hours.  DDimer No results for input(s): DDIMER in the last 168 hours.  Radiology/Studies:  Dg Chest Port 1 View  Result Date: 06/25/2018 CLINICAL DATA:  Chest pain for several hours EXAM: PORTABLE CHEST 1 VIEW COMPARISON:  12/04/2017 FINDINGS: The heart size and mediastinal contours are within normal limits. Both lungs are clear. The visualized skeletal structures are unremarkable. IMPRESSION: No active disease. Electronically Signed   By: Inez Catalina M.D.   On: 06/25/2018 07:24    Assessment and Plan:   Atypical angina in the setting of GI bleed - Per ex-wife's report, patient went off of PPI and eating tomatoes / drinking V8 juice as was told by urology that it may fix his prostate issue. Frequency of eating tomatoes complicated by dementia.  - EKG and telemetry as above - Intubated - Would not recommend antiplatelet / anticoagulation in the setting of GI bleed. No heparin. SCDs for DVT prophylaxis. No ASA. No Plavix.  -  Continue SL nitro as needed for CP - SCDs for DVT prophylaxis. Would not advise heparin given GI bleed - Troponin negative and EKG without STE, low concern for cardiac etiology - Pads attached at this time - Daily BMET, CBC - K 4.6; Cr 1.20; BNP 17  GI bleed and likely associated hypotension - Hypotensive - continue to monitor vitals - receiving IV levophed and IVF - Consider vasovagal reaction  in setting of hemoptysis  - As above, do not recommend any anticoagulation / antiplatelet therapy. Hold home ASA and plavix.  - Hemoptysis in the ED. S/p transfusion - continue to monitor and transfuse for Hgb <8 - Restarted PPI with IV protonix drip. Dr. Rockey Situ spoke to ex-wife regarding contribution of diet to GI bleed  - Per Critical Care, GI   Remainder per IM   For questions or updates, please contact [facility] Please consult [email or web address] for contact info under     Signed, Arvil Chaco, PA-C  06/25/2018 7:43 AM

## 2018-06-25 NOTE — Progress Notes (Signed)
CBC sent to lab

## 2018-06-25 NOTE — Progress Notes (Addendum)
Initial Nutrition Assessment  DOCUMENTATION CODES:   Not applicable  INTERVENTION:   If tube feeds initiated recommend Vital 1.2 @ 53ml/hr- Initiate at 13ml/hr and advance by 33ml/hr every 8 hours until goal rate is reached  Free water flushes 46ml q 4 hrs  Regimen provides 1728kcal/day, 108g/day protein, 1354ml/day free water  NUTRITION DIAGNOSIS:   Inadequate oral intake related to acute illness(pt sedated and ventilated ) as evidenced by NPO status.  GOAL:   Provide needs based on ASPEN/SCCM guidelines  MONITOR:   Vent status, Labs, Weight trends, Skin, I & O's  REASON FOR ASSESSMENT:   Ventilator    ASSESSMENT:    67 y.o. male with a known history of coronary artery disease status post stent in the past, hypertension, hyperlipidemia, dementia/cognitive decline admitted for chest pain and GI bleed  Pt sedated and ventilated. No family at bedside. OGT in place. Pt to LIS with bloody emesis. No plans for tube feeds today. Per chart, pt is weight stable pta.      Medications reviewed and include: protonix, NaCl @50ml /hr, fentanyl  Labs reviewed: BUN 57(H) Wbc- 17.9(H), Hgb 12.6(L), Hct 38.5(L), MCV 71.3(L), MCH 23.3(L)  Patient is currently intubated on ventilator support MV: 11.6 L/min Temp (24hrs), Avg:97.9 F (36.6 C), Min:97.9 F (36.6 C), Max:97.9 F (36.6 C)  Propofol: none  MAP- >63mmHg  NUTRITION - FOCUSED PHYSICAL EXAM:    Most Recent Value  Orbital Region  No depletion  Upper Arm Region  No depletion  Thoracic and Lumbar Region  No depletion  Buccal Region  No depletion  Temple Region  No depletion  Clavicle Bone Region  No depletion  Clavicle and Acromion Bone Region  No depletion  Scapular Bone Region  No depletion  Dorsal Hand  No depletion  Patellar Region  No depletion  Anterior Thigh Region  No depletion  Posterior Calf Region  No depletion  Edema (RD Assessment)  None  Hair  Reviewed  Eyes  Reviewed  Mouth  Reviewed  Skin   Reviewed  Nails  Reviewed     Diet Order:   Diet Order            Diet NPO time specified  Diet effective now             EDUCATION NEEDS:   Not appropriate for education at this time  Skin:  Skin Assessment: Reviewed RN Assessment  Last BM:  pta  Height:   Ht Readings from Last 1 Encounters:  06/25/18 5\' 3"  (1.6 m)    Weight:   Wt Readings from Last 1 Encounters:  06/25/18 79.8 kg    Ideal Body Weight:  56.3 kg  BMI:  Body mass index is 31.18 kg/m.  Estimated Nutritional Needs:   Kcal:  1672kcal/day   Protein:  103-120g/day   Fluid:  >1.7L/day   Koleen Distance MS, RD, LDN Pager #- [phone] Office#- [phone] After Hours Pager: [phone]

## 2018-06-25 NOTE — Telephone Encounter (Signed)
This encounter was created in error - please disregard.

## 2018-06-25 NOTE — ED Provider Notes (Signed)
[facility] Emergency Department Provider Note  ____________________________________________   First MD Initiated Contact with Patient 06/25/18 [phone]     (approximate)  I have reviewed the triage vital signs and the nursing notes.   HISTORY  Chief Complaint Chest Pain   HPI Edwin Woodward is a 67 y.o. male who comes to the emergency department via EMS with substernal pressure-like moderate to severe chest pain that began around 9 PM last night.  He describes the pain as "someone sitting on my chest".  Nonradiating.  Not sudden onset not ripping or tearing does not go straight to his back.  He does have a previous history of myocardial infarction and was stented about 10 years ago.  He reports compliance with his aspirin and Plavix.  His cardiologist is Dr. Rockey Situ.  He was given 324 mg of aspirin in route with some improvement in his symptoms although he has persistent pain at rest now.  He was initially slightly hypoxic to 88% on room air although is no longer hypoxic.  No history of DVT or pulmonary embolism.  He takes no blood thinning medication.  His symptoms are worse with exertion.    Past Medical History:  Diagnosis Date  . CAD (coronary artery disease)   . Cardiovascular stress test abnormal    Jan 2010 no significant ischemia  . Hyperlipidemia   . Hypertension   . Presence of bare metal stent in right coronary artery    proximal RCA adn LAD in April 2009  . Trigger finger     Patient Active Problem List   Diagnosis Date Noted  . GI bleed 06/25/2018  . Allergic conjunctivitis, right 06/23/2017  . Orthostasis 12/09/2016  . Bradycardia 12/09/2016  . 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 GERD (gastroesophageal reflux disease) 04/01/2016  . CKD (chronic kidney disease) stage 3, GFR 30-59 ml/min (HCC) 04/01/2016  . Gout of foot 11/26/2014  . Hypomagnesemia 08/26/2014  . Intention tremor  06/07/2014  . Left sided sciatica 06/07/2014  . Musculoskeletal neck pain 01/19/2014  . Allergic rhinitis 12/04/2013  . Thalassemia 08/16/2011  . GAD (generalized anxiety disorder) 05/14/2011  . Obesity 04/11/2011  . OSA on CPAP 04/11/2011  . Hyperlipidemia 11/22/2009  . HYPERTENSION, BENIGN 11/22/2009  . CAD, NATIVE VESSEL 11/22/2009    Past Surgical History:  Procedure Laterality Date  . APPENDECTOMY    . APPENDECTOMY    . CARPAL TUNNEL RELEASE    . CATARACT EXTRACTION     right  . CORONARY ANGIOPLASTY  12/2007  . CORONARY ARTERY BYPASS GRAFT     2 vessel  . JOINT REPLACEMENT     total knee  . REPLACEMENT TOTAL KNEE    . TONSILLECTOMY    . VASECTOMY      Prior to Admission medications   Medication Sig Start Date End Date Taking? Authorizing Provider  alfuzosin (UROXATRAL) 10 MG 24 hr tablet Take 1 tablet (10 mg total) by mouth daily. 05/23/16   [name], MD  aspirin EC 81 MG tablet Take 1 tablet (81 mg total) by mouth daily. 08/15/15   Minna Merritts, MD  atorvastatin (LIPITOR) 40 MG tablet Take 1 tablet (40 mg total) by mouth daily at 6 PM. Take 40 mg alt with 20 mg daily. 01/07/18   Minna Merritts, MD  buPROPion (WELLBUTRIN SR) 150 MG 12 hr tablet TAKE 1 TABLET BY MOUTH TWICE A DAY 08/13/15   Clapacs, Madie Reno,  MD  calcium carbonate 1250 MG capsule Take 1,250 mg by mouth 2 (two) times daily with a meal.    [provider]  citalopram (CELEXA) 20 MG tablet Take 30 mg by mouth daily. 03/20/16   [provider]  clindamycin (CLEOCIN) 300 MG capsule TAKE 2 CAPSULES ONE HOUR BEFORE DENTAL PROCEDURE 02/20/17   [name], MD  clindamycin (CLEOCIN) 300 MG capsule TAKE 2 CAPSULES BY MOUTH 1 HOUR BEFORE DENTAL PROCEDURE 01/11/18   [name], MD  clopidogrel (PLAVIX) 75 MG tablet Take 1 tablet (75 mg total) by mouth daily. 01/07/18   Minna Merritts, MD  clopidogrel (PLAVIX) 75 MG tablet TAKE 1 TABLET (75 MG TOTAL) BY MOUTH DAILY. 05/31/18   Minna Merritts, MD  diclofenac sodium (VOLTAREN) 1 % GEL Apply 1 application topically as needed. 11/19/11   [name], MD  donepezil (ARICEPT) 10 MG tablet Take 10 mg by mouth at bedtime. Pt takes 13m in the morning and 5103mat night 03/20/16   [provider]  fluticasone (FLONASE) 50 MCG/ACT nasal spray Place 2 sprays into both nostrils daily. 10/03/15   SoLeone HavenMD  magnesium oxide (MAG-OX) 400 MG tablet TAKE 1 TABLET (400 MG TOTAL) BY MOUTH 2 (TWO) TIMES DAILY AFTER A MEAL 04/22/18   TuCrecencio McMD  nitroGLYCERIN (NITROSTAT) 0.4 MG SL tablet Place 1 tablet (0.4 mg total) under the tongue every 5 (five) minutes as needed. 04/11/11   GoMinna MerrittsMD  nystatin (MYCOSTATIN/NYSTOP) powder Apply topically. 07/23/16   [provider]  olopatadine (PATANOL) 0.1 % ophthalmic solution Place 1 drop into both eyes 2 (two) times daily. 06/22/17   TuCrecencio McMD  risperiDONE (RISPERDAL) 0.25 MG tablet Take 0.25 mg by mouth 2 (two) times daily.  09/14/15 10/14/15  [provider]    Allergies Biaxin [clarithromycin]; Clarithromycin; Codeine; Penicillins; Promethazine; Iodinated diagnostic agents; and Metrizamide  Family History  Problem Relation Age of Onset  . Hypertension Mother   . Diabetes Mother   . Thalassemia Mother   . Heart disease Father     Social History Social History   Tobacco Use  . Smoking status: Former SmResearch scientist (life sciences). Smokeless tobacco: Never Used  Substance Use Topics  . Alcohol use: No    Alcohol/week: 0.0 standard drinks  . Drug use: No    Types: Marijuana    Review of Systems Constitutional: No fever/chills Eyes: No visual changes. ENT: No sore throat. Cardiovascular: Positive for chest pain. Respiratory: Positive for shortness of breath. Gastrointestinal: No abdominal pain.  Positive for nausea, no vomiting.  No diarrhea.  No constipation. Genitourinary: Negative for dysuria. Musculoskeletal: Negative for back pain. Skin:  Negative for rash. Neurological: Negative for headaches, focal weakness or numbness.   ____________________________________________   PHYSICAL EXAM:  VITAL SIGNS: ED Triage Vitals [06/25/18 0646]  Enc Vitals Group     BP      Pulse      Resp      Temp      Temp src      SpO2 (!) 88 %     Weight      Height      Head Circumference      Peak Flow      Pain Score      Pain Loc      Pain Edu?      Excl. in [place]    Constitutional: Alert and oriented x4 grey  skin and appears unwell Eyes: PERRL EOMI. Head: Atraumatic. Nose: No congestion/rhinnorhea. Mouth/Throat: No trismus Neck: No stridor.   Cardiovascular: Tachycardic rate, regular rhythm. Grossly normal heart sounds.  Good peripheral circulation. Respiratory: Increased respiratory effort.  No retractions. Lungs CTAB and moving good air Gastrointestinal: Soft nontender Musculoskeletal: No lower extremity edema   Neurologic:  Normal speech and language. No gross focal neurologic deficits are appreciated. Skin:  Skin is warm, dry and intact. No rash noted. Psychiatric: Anxious appearing    ____________________________________________   DIFFERENTIAL includes but not limited to  STEMI, non-STEMI, cardiogenic shock, hemorrhagic shock, upper GI bleed ____________________________________________   LABS (all labs ordered are listed, but only abnormal results are displayed)  Labs Reviewed  COMPREHENSIVE METABOLIC PANEL - Abnormal; Notable for the following components:      Result Value   Glucose, Bld 141 (*)    BUN 57 (*)    All other components within normal limits  CBC WITH DIFFERENTIAL/PLATELET - Abnormal; Notable for the following components:   WBC 12.4 (*)    Hemoglobin 10.6 (*)    HCT 33.8 (*)    MCV 66.9 (*)    MCH 21.0 (*)    RDW 16.2 (*)    nRBC 0.3 (*)    Neutro Abs 10.5 (*)    All other components within normal limits  TROPONIN I  BRAIN NATRIURETIC PEPTIDE  DIC (DISSEMINATED INTRAVASCULAR  COAGULATION) PANEL  HEMOGLOBIN AND HEMATOCRIT, BLOOD  MASSIVE TRANSFUSION PROTOCOL ORDER (BLOOD BANK NOTIFICATION)  ABO/RH  TYPE AND SCREEN    Lab work reviewed by me shows elevated BUN consistent with upper GI bleed __________________________________________  EKG  ED ECG REPORT I, Darel Hong, the attending physician, personally viewed and interpreted this ECG.  Date: 06/25/2018 EKG Time:  Rate: 108 Rhythm: Sinus tachycardia QRS Axis: Rightward axis Intervals: normal ST/T Wave abnormalities: ST elevation in leads I and aVL with anterior depression concerning for STEMI Narrative Interpretation: Concerning for STEMI  ____________________________________________  RADIOLOGY  First chest x-ray reviewed by me with no acute disease Second chest x-ray reviewed by me shows ET tube and OG tube in good position ____________________________________________   PROCEDURES  Procedure(s) performed: Yes  .Critical Care Performed by: Darel Hong, MD Authorized by: Darel Hong, MD   Critical care provider statement:    Critical care time (minutes):  65   Critical care time was exclusive of:  Separately billable procedures and treating other patients   Critical care was necessary to treat or prevent imminent or life-threatening deterioration of the following conditions:  Cardiac failure   Critical care was time spent personally by me on the following activities:  Development of treatment plan with patient or surrogate, discussions with consultants, evaluation of patient's response to treatment, examination of patient, obtaining history from patient or surrogate, ordering and performing treatments and interventions, ordering and review of laboratory studies, ordering and review of radiographic studies, pulse oximetry, re-evaluation of patient's condition and review of old charts  Procedure Name: Intubation Date/Time: 06/25/2018 8:17 AM Performed by: Darel Hong,  MD Pre-anesthesia Checklist: Patient identified, Patient being monitored, Emergency Drugs available, Timeout performed and Suction available Oxygen Delivery Method: Non-rebreather mask Preoxygenation: Pre-oxygenation with 100% oxygen Induction Type: Rapid sequence Ventilation: Mask ventilation without difficulty Laryngoscope Size: Mac and 4 Grade View: Grade II Tube size: 7.5 mm Number of attempts: 1 Placement Confirmation: ETT inserted through vocal cords under direct vision,  CO2 detector and Breath sounds checked- equal and bilateral Secured at: 23 cm Tube secured  with: ETT holder       Critical Care performed: Yes  ____________________________________________   INITIAL IMPRESSION / ASSESSMENT AND PLAN / ED COURSE  Pertinent labs & imaging results that were available during my care of the patient were reviewed by me and considered in my medical decision making (see chart for details).   As part of my medical decision making, I reviewed the following data within the [place] History obtained from family if available, nursing notes, old chart and ekg, as well as notes from prior ED visits.  The patient comes to the emergency department tachycardic, gray and ashen and uncomfortable appearing with a concerning EKG.  He has known coronary artery disease and his EKG was different from previous.  He has a bifascicular block he had high lateral ST elevation.  I activated the catheterization lab and initially spoke with Dr. Saralyn Pilar who passed the case on to Dr. Fletcher Anon the morning cardiologist.  Prior to Dr. Tyrell Antonio arrival the patient became bradycardic to the 50s, diaphoretic, and his pain worsened and he syncopized and we were unable to feel pulses.  He got 5 to 10 seconds of CPR and he woke back up.  I did give 1 mg of atropine during this process.  Dr. Fletcher Anon then came to the bedside and as he was about to take the patient to cardiac catheterization the patient vomited  a large amount of bright red blood.  He continued to vomit so decision was made to intubate.  In discussion with Dr. Mariea Clonts at the patient's EKG is not consistent with cardiogenic shock and the patient most likely has an upper GI bleed and known coronary artery disease causing a type II end STEMI from stress and demand.  Patient was intubated without complication and given 2 units of emergent blood along with a dirty epi drip at first and then switched onto levo fed.  I escorted the patient myself to CT scan and he did not have a aortic dissection.  At this point he most likely has an upper GI bleed causing hemorrhagic shock.  He is intubated with an OG tube in on the fentanyl and Versed infusions getting blood transfusions.  GI Dr. Allen Norris aware and I spoke with the hospitalist who has graciously agreed to admit the patient to her service.        FINAL CLINICAL IMPRESSION(S) / ED DIAGNOSES  Final diagnoses:  ST elevation myocardial infarction (STEMI), unspecified artery ([place])  Shock ([place])  Upper GI bleed      NEW MEDICATIONS STARTED DURING THIS VISIT:  New Prescriptions   No medications on file     Note:  This document was prepared using Dragon voice recognition software and may include unintentional dictation errors.     Darel Hong, MD 06/25/18 [phone]

## 2018-06-25 NOTE — ED Notes (Signed)
1mg  Epinephrine mixed in 500cc bag and initiated by MD.

## 2018-06-25 NOTE — ED Notes (Signed)
2nd episode of hematemesis noted, Dr. Mable Paris aware, intubating for airway protection.

## 2018-06-25 NOTE — ED Notes (Signed)
Dr. Rockey Situ at bedside at this time.

## 2018-06-25 NOTE — Progress Notes (Signed)
Central Venous Catheter Insertion Procedure Note      Procedure Indication: Acute respiratory failure with hypotension Procedure: Insertion of Central Venous Catheter Procedure consent form signed: Emergent  Procedure Performed by: Lahoma Rocker MD Specimen Removed: None Anesthesia: 1% lidocaine  EBL: Minimal  Complications: No apparent complications    Procedure Details:  Type of line: Subclavian implied.  Risks versus benefits of the procedure were discussed with all questions asked and answered.   A "universal pause" was done per [facility] protocol, confirming correct patient, procedure, consent, equipment, and positioning with bedsdie RN.  The patient was monitored with continuous bedside telemetry, pulse oximetry, and non-invasive blood pressure measurements throughout the duration of the procedure.   Using full barrier sterile precautions, the right neck was prepped with Chlorhexidene and draped in the usual sterile fashion.  Local anesthesia was injected into the subcutaneous tissues.   A trochar needle was then inserted into the vein; dark red blood was easily aspirated into the syringe.  Using the Seldinger technique, the syringe was then removed and a J-tip guide wire was advanced easily through the needle. There were no arrhythmias. The needle was then withdrawn over the wire.  A scalpel was then used to make a small skin nick along the guidewire.  A dilator was then passed over the guidewire without difficulty.  The dilator was then removed and a 8.5 fr. quad lumen central venous catheter was passed over the guide wire to an insertion depth of 16 cm. The guide wire was then removed and the catheter was sutured into place using 2-0 silk suture.  All ports easily aspirated dark red blood and were flushed with sterile saline.  A sterile bio occlusive dressing with Chlorhexidine patch was then applied over the insertion site.  Use of Ultrasound No  Post Procedure: The patient tolerated the  procedure well.  There were no known complications at the conclusion of the procedure.  EBL was less than 48mL.    Xray checked good position no pneumothorax    Burdell Peed [facility] Pulmonary Critical Care & Sleep Medicine

## 2018-06-25 NOTE — ED Notes (Signed)
Pt had 1 episode of hematemesis, large clots noted at this time. Dr. Audelia Acton and Dr. Mable Paris at bedside and aware. Per Dr. Audelia Acton, no heparin.

## 2018-06-25 NOTE — ED Notes (Signed)
7.5 ETT placed, 23 at the lip. +Color change, bilateral breath sounds noted per MD.

## 2018-06-25 NOTE — Consult Note (Signed)
Edwin Lame, MD [facility]  [address] [place], [place] [address] Phone: [phone] Fax : [phone]  Consultation  Referring Provider:     Dr. Manuella Woodward Primary Care Physician:  Edwin Mc, MD Primary Gastroenterologist:       Edwin Woodward    Reason for Consultation:     Upper GI bleed  Date of Admission:  06/25/2018 Date of Consultation:  06/25/2018         HPI:   Edwin Woodward is a 67 y.o. male who has a history of coronary artery disease and has had stents placed in the past.  This was reported to be many years ago.  The patient also has some cognitive decline and dementia with hypertension and hyperlipidemia.  The patient was reported to have had chest pain last night and this was associated with some shortness of breath and dizziness.  The EMS service was called and the patient had some minimal EKG changes and a code STEMI was initiated the patient then had a drop in blood pressure down to the 70s and was reported to start vomiting maroon/red blood.  The patient was intubated and put on pressors and PPIs.  The patient was then given 2 units of blood in the ER and a GI consult was called.  The patient had an NG tube that showed dark thick material coming out of the NG. The patient's liver enzymes are normal with an elevated lactic acid of 2.1.  The patient's hemoglobin in March of this year was 9.1 with his admission hemoglobin of 10.6.  The patient's repeat hemoglobin is 12.6 after 2 units of blood.  Past Medical History:  Diagnosis Date  . CAD (coronary artery disease)   . Cardiovascular stress test abnormal    Jan 2010 no significant ischemia  . Hyperlipidemia   . Hypertension   . Presence of bare metal stent in right coronary artery    proximal RCA adn LAD in April 2009  . Trigger finger     Past Surgical History:  Procedure Laterality Date  . APPENDECTOMY    . APPENDECTOMY    . CARPAL TUNNEL RELEASE    . CATARACT EXTRACTION     right  . CORONARY  ANGIOPLASTY  12/2007  . CORONARY ARTERY BYPASS GRAFT     2 vessel  . JOINT REPLACEMENT     total knee  . REPLACEMENT TOTAL KNEE    . TONSILLECTOMY    . VASECTOMY      Prior to Admission medications   Medication Sig Start Date End Date Taking? Authorizing Provider  alfuzosin (UROXATRAL) 10 MG 24 hr tablet Take 1 tablet (10 mg total) by mouth daily. 05/23/16  Yes Edwin Mc, MD  aspirin EC 81 MG tablet Take 1 tablet (81 mg total) by mouth daily. 08/15/15  Yes Minna Merritts, MD  atorvastatin (LIPITOR) 40 MG tablet Take 1 tablet (40 mg total) by mouth daily at 6 PM. Take 40 mg alt with 20 mg daily. 01/07/18  Yes Gollan, Kathlene November, MD  buPROPion (WELLBUTRIN SR) 150 MG 12 hr tablet TAKE 1 TABLET BY MOUTH TWICE A DAY 08/13/15  Yes Clapacs, Madie Reno, MD  calcium carbonate 1250 MG capsule Take 1,250 mg by mouth 2 (two) times daily with a meal.   Yes [provider]  citalopram (CELEXA) 20 MG tablet Take 30 mg by mouth daily. 03/20/16  Yes [provider]  clopidogrel (PLAVIX) 75 MG tablet Take 1 tablet (75  mg total) by mouth daily. 01/07/18  Yes Gollan, Kathlene November, MD  donepezil (ARICEPT) 5 MG tablet Take 5 mg by mouth 2 (two) times daily.  03/20/16  Yes [provider]  finasteride (PROSCAR) 5 MG tablet Take 5 mg by mouth daily.   Yes [provider]  magnesium oxide (MAG-OX) 400 MG tablet TAKE 1 TABLET (400 MG TOTAL) BY MOUTH 2 (TWO) TIMES DAILY AFTER A MEAL Patient taking differently: Take 400 mg by mouth 2 (two) times daily.  04/22/18  Yes Edwin Mc, MD  nitroGLYCERIN (NITROSTAT) 0.4 MG SL tablet Place 1 tablet (0.4 mg total) under the tongue every 5 (five) minutes as needed. 04/11/11  Yes Minna Merritts, MD  risperiDONE (RISPERDAL) 0.25 MG tablet Take 0.25 mg by mouth 2 (two) times daily.  09/14/15 06/26/19 Yes [provider]  clopidogrel (PLAVIX) 75 MG tablet TAKE 1 TABLET (75 MG TOTAL) BY MOUTH DAILY. Patient not taking: Reported on 06/25/2018  05/31/18   Minna Merritts, MD  olopatadine (PATANOL) 0.1 % ophthalmic solution Place 1 drop into both eyes 2 (two) times daily. Patient not taking: Reported on 06/25/2018 06/22/17   Edwin Mc, MD    Family History  Problem Relation Age of Onset  . Hypertension Mother   . Diabetes Mother   . Thalassemia Mother   . Heart disease Father      Social History   Tobacco Use  . Smoking status: Former Research scientist (life sciences)  . Smokeless tobacco: Never Used  Substance Use Topics  . Alcohol use: No    Alcohol/week: 0.0 standard drinks  . Drug use: No    Types: Marijuana    Allergies as of 06/25/2018 - Review Complete 06/25/2018  Allergen Reaction Noted  . Biaxin [clarithromycin]    . Clarithromycin  05/13/2011  . Codeine    . Penicillins    . Promethazine  10/05/2013  . Iodinated diagnostic agents Rash 03/21/2013  . Metrizamide Rash 03/21/2013    Review of Systems:    All systems reviewed and negative except where noted in HPI.   Physical Exam:  Vital signs in last 24 hours: Temp:  [97.9 F (36.6 C)] 97.9 F (36.6 C) (10/18 0648) Pulse Rate:  [66-131] 70 (10/18 0900) Resp:  [14-31] 23 (10/18 0900) BP: (70-154)/(48-94) 133/87 (10/18 0900) SpO2:  [88 %-100 %] 98 % (10/18 0919) FiO2 (%):  [40 %-60 %] 40 % (10/18 0919) Weight:  [79.8 kg] 79.8 kg (10/18 0650)   General:   Patient intubated on a ventilator. Head:  Normocephalic and atraumatic. Eyes:   No icterus.   Conjunctiva pink. PERRLA. Ears: Unable to assess. Neck:  Supple; no masses or thyroidomegaly Lungs: Respirations even and unlabored. Lungs clear to auscultation bilaterally.   No wheezes, crackles, or rhonchi.  Heart:  Regular rate and rhythm;  Without murmur, clicks, rubs or gallops Abdomen:  Soft, nondistended, nontender. Normal bowel sounds. No appreciable masses or hepatomegaly.  No rebound or guarding.  Rectal:  Not performed. Msk:  Symmetrical without gross deformities.   Extremities:  Without edema, cyanosis or  clubbing. Neurologic: Patient intubated and unable to assess neurological exam Skin:  Intact without significant lesions or rashes. Cervical Nodes:  No significant cervical adenopathy. Psych: Intubated.  LAB RESULTS: Recent Labs    06/25/18 0654 06/25/18 1015  WBC 12.4* 17.9*  HGB 10.6* 12.6*  HCT 33.8* 38.5*  PLT 278 250   BMET Recent Labs    06/25/18 0654  NA 138  K 4.6  CL 105  CO2 22  GLUCOSE 141*  BUN 57*  CREATININE 1.20  CALCIUM 9.1   LFT Recent Labs    06/25/18 0654  PROT 6.7  ALBUMIN 3.8  AST 25  ALT 25  ALKPHOS 63  BILITOT 1.0   PT/INR No results for input(s): LABPROT, INR in the last 72 hours.  STUDIES: Dg Chest Port 1 View  Result Date: 06/25/2018 CLINICAL DATA:  Central line placement EXAM: PORTABLE CHEST 1 VIEW COMPARISON:  06/25/2018 FINDINGS: Left subclavian central line tip SVC RA junction level. Endotracheal tube remains 3.4 cm above the carina. NG tube within the stomach. Defibrillator pads overlie the chest. Low lung volumes persist with scattered atelectasis. No focal collapse or consolidation. No large effusion or pneumothorax. IMPRESSION: Left subclavian central line tip SVC RA junction level. Lower lung volumes with increased atelectasis Other support apparatus stable. Electronically Signed   By: Jerilynn Mages.  Shick M.D.   On: 06/25/2018 10:27   Dg Chest Portable 1 View  Result Date: 06/25/2018 CLINICAL DATA:  Intubation. EXAM: PORTABLE CHEST 1 VIEW COMPARISON:  06/25/2018. FINDINGS: Endotracheal tube noted with tip 3 cm above the carina. NG tube noted with tip below left hemidiaphragm. Heart size normal. Mild bibasilar atelectasis/infiltrates. Tiny left pleural effusion. No pneumothorax. No acute bony abnormality. IMPRESSION: 1. Endotracheal tube noted with tip 3 cm above the carina. NG tube noted with tip below left hemidiaphragm. 2. Mild bibasilar atelectasis/infiltrates. Tiny left pleural effusion. Electronically Signed   By: Marcello Moores  Register    On: 06/25/2018 08:05   Dg Chest Port 1 View  Result Date: 06/25/2018 CLINICAL DATA:  Chest pain for several hours EXAM: PORTABLE CHEST 1 VIEW COMPARISON:  12/04/2017 FINDINGS: The heart size and mediastinal contours are within normal limits. Both lungs are clear. The visualized skeletal structures are unremarkable. IMPRESSION: No active disease. Electronically Signed   By: Inez Catalina M.D.   On: 06/25/2018 07:24   Ct Angio Chest/abd/pel For Dissection W And/or W/wo  Result Date: 06/25/2018 CLINICAL DATA:  Chest pain, shortness of breath, nausea, dizziness and diaphoresis. Now intubated EXAM: CT ANGIOGRAPHY CHEST, ABDOMEN AND PELVIS TECHNIQUE: Multidetector CT imaging through the chest, abdomen and pelvis was performed using the standard protocol during bolus administration of intravenous contrast. Multiplanar reconstructed images and MIPs were obtained and reviewed to evaluate the vascular anatomy. CONTRAST:  162mL ISOVUE-370 IOPAMIDOL (ISOVUE-370) INJECTION 76% COMPARISON:  Chest x-ray today. FINDINGS: CTA CHEST FINDINGS Cardiovascular: The thoracic aorta is normal in caliber and well opacified. No evidence of aneurysmal disease or aortic dissection. No significant atherosclerosis identified of the aorta or visualized proximal great vessels. The heart size is normal. No pericardial fluid identified. Central pulmonary arteries are of normal caliber. Calcified plaque versus stent present in the distribution of the LAD. There likely also is plaque versus stent in the distribution of the RCA. Mediastinum/Nodes: Endotracheal tube present. No enlarged mediastinal, hilar, or axillary lymph nodes. Thyroid gland, trachea, and esophagus demonstrate no significant findings. Lungs/Pleura: Posterior bibasilar opacities present, right greater than left most likely representing atelectasis. A component of possible aspiration is not excluded. There is no evidence of airspace edema, pneumothorax, nodule or pleural fluid.  Musculoskeletal: No chest wall abnormality. No acute or significant osseous findings. Review of the MIP images confirms the above findings. CTA ABDOMEN AND PELVIS FINDINGS VASCULAR Aorta: The abdominal aorta is normally patent and demonstrates mild atherosclerotic plaque. No evidence of aneurysmal disease or dissection. Celiac: Normally patent.  Normal distal branch vessel anatomy. SMA: Normally patent.  Renals: Bilateral single renal arteries demonstrate normal patency. IMA: Normally patent. Inflow: Bilateral iliac arteries demonstrate normal patency. Common femoral arteries and femoral bifurcations are normally patent. Review of the MIP images confirms the above findings. NON-VASCULAR Hepatobiliary: No focal liver abnormality is seen. No gallstones, gallbladder wall thickening, or biliary dilatation. Pancreas: Unremarkable. No pancreatic ductal dilatation or surrounding inflammatory changes. Spleen: Normal in size without focal abnormality. Adrenals/Urinary Tract: Adrenal glands are unremarkable. Kidneys are normal, without renal calculi, focal lesion, or hydronephrosis. Bladder is unremarkable. Stomach/Bowel: Gastric decompression tube extends into the stomach. Bowel shows no evidence of obstruction, ileus or perforation. No inflammation identified. There is moderate stool in the rectum. Lymphatic: No enlarged lymph nodes identified. Reproductive: Prostate is unremarkable. Other: A right inguinal hernia contains fat. No ascites or abscess identified. Musculoskeletal: The lumbar spine demonstrates degenerative disc disease. No bony fractures or lesions identified. Review of the MIP images confirms the above findings. IMPRESSION: 1. No acute aortic pathology identified in the chest or abdomen. No vascular abnormalities identified. 2. Bibasilar opacities, right greater than left may represent atelectasis. Component of aspiration cannot be excluded, especially at the right lung base. 3. Coronary atherosclerosis with  evidence of calcified plaque and/or stents in the LAD and RCA. 4. Right inguinal hernia containing fat. Electronically Signed   By: Aletta Edouard M.D.   On: 06/25/2018 08:42      Impression / Plan:   Assessment: Active Problems:   GI bleed   Edwin Woodward is a 67 y.o. y/o male with admitted with a presumed GI bleed with a hemoglobin of 10.6 on admission which is above his baseline of 9.1 in March of this year.  The patient was transfused 2 units of blood and had a repeat hemoglobin of 12.6.  The patient does have a report of hematemesis with some dark material from the NG tube  Plan:  The patient's hemoglobin is stable and his admission hemoglobin is higher than his baseline of 9.1.  The patient does not appear to be actively bleeding at the present time with only old blood coming from his NG tube.  The GI bleeding may have been a side effect of a Mallory-Weiss tear from vomiting.  I do not believe that a urgent or emergent EGD is needed at this time.  I will follow the patient along with you during his hospital stay.  Thank you for involving me in the care of this patient.      LOS: 0 days   Edwin Lame, MD  06/25/2018, 11:44 AM    Note: This dictation was prepared with Dragon dictation along with smaller phrase technology. Any transcriptional errors that result from this process are unintentional.

## 2018-06-25 NOTE — ED Notes (Signed)
MD at bedside, pt noted to become bradycardic, became diaphoretic and unresponsive. Pt's BP 70/48, HR 148. This RN, Ignatius Specking, RN, Janett Billow, RN, Dr. Mable Paris at bedside at this time.

## 2018-06-25 NOTE — ED Notes (Signed)
Report to Christina, RN

## 2018-06-25 NOTE — H&P (Addendum)
[facility] [place] NAME: Edwin Woodward    MR#:  [id number]  DATE OF BIRTH:  09/29/1950  DATE OF ADMISSION:  06/25/2018  PRIMARY CARE PHYSICIAN: [name], MD   REQUESTING/REFERRING PHYSICIAN: Dr Mable Paris  CHIEF COMPLAINT:  patient currently is intubated on the ventilator. No family in the ER. Unable to reach family on the phone. History obtained from ER physician and medical records.  HISTORY OF PRESENT ILLNESS:  Edwin Woodward  is a 67 y.o. male with a known history of coronary artery disease status post stent in the past, hypertension, hyperlipidemia, dementia/cognitive decline came to the emergency room by EMS after he started having some chest pain around 9 PM last night. Patient reported some shortness of breath nausea and dizziness. EMS gave him aspirin this morning. There was some minimal EKG changes seen in lead 1 and avl and ER initiated code STEMI. Patient's blood pressure suddenly drop in the 70s and he started vomiting maroon/red blood. He got intubated on the ventilator currently on IV levophed, IV Protonix, IV Versed, fluids and received emergently two units of blood transfusion. He also received IV tranexamic acid.  Patient currently is critically ill however blood pressure is much improved since he came in. He is being admitted with G.I. bleed unknown etiology.  Patient is being admitted to the ICU. Seen by cardiology. G.I. aware of patient being admitted.  No family in the ER  PAST MEDICAL HISTORY:   Past Medical History:  Diagnosis Date  . CAD (coronary artery disease)   . Cardiovascular stress test abnormal    Jan 2010 no significant ischemia  . Hyperlipidemia   . Hypertension   . Presence of bare metal stent in right coronary artery    proximal RCA adn LAD in April 2009  . Trigger finger     PAST SURGICAL HISTOIRY:   Past Surgical History:  Procedure Laterality Date  . APPENDECTOMY    .  APPENDECTOMY    . CARPAL TUNNEL RELEASE    . CATARACT EXTRACTION     right  . CORONARY ANGIOPLASTY  12/2007  . CORONARY ARTERY BYPASS GRAFT     2 vessel  . JOINT REPLACEMENT     total knee  . REPLACEMENT TOTAL KNEE    . TONSILLECTOMY    . VASECTOMY      SOCIAL HISTORY:   Social History   Tobacco Use  . Smoking status: Former Research scientist (life sciences)  . Smokeless tobacco: Never Used  Substance Use Topics  . Alcohol use: No    Alcohol/week: 0.0 standard drinks    FAMILY HISTORY:   Family History  Problem Relation Age of Onset  . Hypertension Mother   . Diabetes Mother   . Thalassemia Mother   . Heart disease Father     DRUG ALLERGIES:   Allergies  Allergen Reactions  . Biaxin [Clarithromycin]   . Clarithromycin   . Codeine     Sever vomiting   . Penicillins   . Promethazine   . Iodinated Diagnostic Agents Rash  . Metrizamide Rash    REVIEW OF SYSTEMS:  Review of Systems  Unable to perform ROS: Intubated     MEDICATIONS AT HOME:   Prior to Admission medications   Medication Sig Start Date End Date Taking? Authorizing Provider  alfuzosin (UROXATRAL) 10 MG 24 hr tablet Take 1 tablet (10 mg total) by mouth daily. 05/23/16   [name], MD  aspirin EC 81 MG  tablet Take 1 tablet (81 mg total) by mouth daily. 08/15/15   Minna Merritts, MD  atorvastatin (LIPITOR) 40 MG tablet Take 1 tablet (40 mg total) by mouth daily at 6 PM. Take 40 mg alt with 20 mg daily. 01/07/18   Minna Merritts, MD  buPROPion (WELLBUTRIN SR) 150 MG 12 hr tablet TAKE 1 TABLET BY MOUTH TWICE A DAY 08/13/15   Clapacs, Madie Reno, MD  calcium carbonate 1250 MG capsule Take 1,250 mg by mouth 2 (two) times daily with a meal.    [provider]  citalopram (CELEXA) 20 MG tablet Take 30 mg by mouth daily. 03/20/16   [provider]  clindamycin (CLEOCIN) 300 MG capsule TAKE 2 CAPSULES ONE HOUR BEFORE DENTAL PROCEDURE 02/20/17   [name], MD  clindamycin (CLEOCIN) 300 MG capsule TAKE 2  CAPSULES BY MOUTH 1 HOUR BEFORE DENTAL PROCEDURE 01/11/18   [name], MD  clopidogrel (PLAVIX) 75 MG tablet Take 1 tablet (75 mg total) by mouth daily. 01/07/18   Minna Merritts, MD  clopidogrel (PLAVIX) 75 MG tablet TAKE 1 TABLET (75 MG TOTAL) BY MOUTH DAILY. 05/31/18   Minna Merritts, MD  diclofenac sodium (VOLTAREN) 1 % GEL Apply 1 application topically as needed. 11/19/11   [name], MD  donepezil (ARICEPT) 10 MG tablet Take 10 mg by mouth at bedtime. Pt takes 5mg  in the morning and 5mg  at night 03/20/16   [provider]  fluticasone (FLONASE) 50 MCG/ACT nasal spray Place 2 sprays into both nostrils daily. 10/03/15   Leone Haven, MD  magnesium oxide (MAG-OX) 400 MG tablet TAKE 1 TABLET (400 MG TOTAL) BY MOUTH 2 (TWO) TIMES DAILY AFTER A MEAL 04/22/18   [name], MD  nitroGLYCERIN (NITROSTAT) 0.4 MG SL tablet Place 1 tablet (0.4 mg total) under the tongue every 5 (five) minutes as needed. 04/11/11   Minna Merritts, MD  nystatin (MYCOSTATIN/NYSTOP) powder Apply topically. 07/23/16   [provider]  olopatadine (PATANOL) 0.1 % ophthalmic solution Place 1 drop into both eyes 2 (two) times daily. 06/22/17   [name], MD  risperiDONE (RISPERDAL) 0.25 MG tablet Take 0.25 mg by mouth 2 (two) times daily.  09/14/15 10/14/15  [provider]      VITAL SIGNS:  Blood pressure (!) 144/89, pulse 66, temperature 97.9 F (36.6 C), temperature source Oral, resp. rate 19, height 5\' 3"  (1.6 m), weight 79.8 kg, SpO2 100 %.  PHYSICAL EXAMINATION:  GENERAL:  67 y.o.-year-old patient lying in the bed with  acute distress. Critically ill intubated on the ventilator EYES: Pupils equal, round, reactive to light and accommodation. No scleral icterus. Extraocular muscles intact. NG +with BRBPR HEENT: Head atraumatic, normocephalic. Oropharynx and nasopharynx clear.  NECK:  Supple, no jugular venous distention. No thyroid enlargement, no tenderness.  LUNGS:  Normal breath sounds bilaterally, no wheezing, rales,rhonchi or crepitation. No use of accessory muscles of respiration.  CARDIOVASCULAR: S1, S2 normal. No murmurs, rubs, or gallops.  ABDOMEN: Soft, nontender, nondistended. Bowel sounds present. No organomegaly or mass.  EXTREMITIES: No pedal edema, cyanosis, or clubbing.  NEUROLOGIC: on the vent PSYCHIATRIC: sedated on the vent  SKIN: No obvious rash, lesion, or ulcer.   LABORATORY PANEL:   CBC Recent Labs  Lab 06/25/18 0654  WBC 12.4*  HGB 10.6*  HCT 33.8*  PLT 278   ------------------------------------------------------------------------------------------------------------------  Chemistries  Recent Labs  Lab 06/25/18 0654  NA 138  K 4.6  CL 105  CO2 22  GLUCOSE 141*  BUN 57*  CREATININE 1.20  CALCIUM 9.1  AST 25  ALT 25  ALKPHOS 63  BILITOT 1.0   ------------------------------------------------------------------------------------------------------------------  Cardiac Enzymes Recent Labs  Lab 06/25/18 0654  TROPONINI <0.03   ------------------------------------------------------------------------------------------------------------------  RADIOLOGY:  Dg Chest Portable 1 View  Result Date: 06/25/2018 CLINICAL DATA:  Intubation. EXAM: PORTABLE CHEST 1 VIEW COMPARISON:  06/25/2018. FINDINGS: Endotracheal tube noted with tip 3 cm above the carina. NG tube noted with tip below left hemidiaphragm. Heart size normal. Mild bibasilar atelectasis/infiltrates. Tiny left pleural effusion. No pneumothorax. No acute bony abnormality. IMPRESSION: 1. Endotracheal tube noted with tip 3 cm above the carina. NG tube noted with tip below left hemidiaphragm. 2. Mild bibasilar atelectasis/infiltrates. Tiny left pleural effusion. Electronically Signed   By: Marcello Moores  Register   On: 06/25/2018 08:05   Dg Chest Port 1 View  Result Date: 06/25/2018 CLINICAL DATA:  Chest pain for several hours EXAM: PORTABLE CHEST 1 VIEW COMPARISON:   12/04/2017 FINDINGS: The heart size and mediastinal contours are within normal limits. Both lungs are clear. The visualized skeletal structures are unremarkable. IMPRESSION: No active disease. Electronically Signed   By: Inez Catalina M.D.   On: 06/25/2018 07:24   Ct Angio Chest/abd/pel For Dissection W And/or W/wo  Result Date: 06/25/2018 CLINICAL DATA:  Chest pain, shortness of breath, nausea, dizziness and diaphoresis. Now intubated EXAM: CT ANGIOGRAPHY CHEST, ABDOMEN AND PELVIS TECHNIQUE: Multidetector CT imaging through the chest, abdomen and pelvis was performed using the standard protocol during bolus administration of intravenous contrast. Multiplanar reconstructed images and MIPs were obtained and reviewed to evaluate the vascular anatomy. CONTRAST:  148mL ISOVUE-370 IOPAMIDOL (ISOVUE-370) INJECTION 76% COMPARISON:  Chest x-ray today. FINDINGS: CTA CHEST FINDINGS Cardiovascular: The thoracic aorta is normal in caliber and well opacified. No evidence of aneurysmal disease or aortic dissection. No significant atherosclerosis identified of the aorta or visualized proximal great vessels. The heart size is normal. No pericardial fluid identified. Central pulmonary arteries are of normal caliber. Calcified plaque versus stent present in the distribution of the LAD. There likely also is plaque versus stent in the distribution of the RCA. Mediastinum/Nodes: Endotracheal tube present. No enlarged mediastinal, hilar, or axillary lymph nodes. Thyroid gland, trachea, and esophagus demonstrate no significant findings. Lungs/Pleura: Posterior bibasilar opacities present, right greater than left most likely representing atelectasis. A component of possible aspiration is not excluded. There is no evidence of airspace edema, pneumothorax, nodule or pleural fluid. Musculoskeletal: No chest wall abnormality. No acute or significant osseous findings. Review of the MIP images confirms the above findings. CTA ABDOMEN AND  PELVIS FINDINGS VASCULAR Aorta: The abdominal aorta is normally patent and demonstrates mild atherosclerotic plaque. No evidence of aneurysmal disease or dissection. Celiac: Normally patent.  Normal distal branch vessel anatomy. SMA: Normally patent. Renals: Bilateral single renal arteries demonstrate normal patency. IMA: Normally patent. Inflow: Bilateral iliac arteries demonstrate normal patency. Common femoral arteries and femoral bifurcations are normally patent. Review of the MIP images confirms the above findings. NON-VASCULAR Hepatobiliary: No focal liver abnormality is seen. No gallstones, gallbladder wall thickening, or biliary dilatation. Pancreas: Unremarkable. No pancreatic ductal dilatation or surrounding inflammatory changes. Spleen: Normal in size without focal abnormality. Adrenals/Urinary Tract: Adrenal glands are unremarkable. Kidneys are normal, without renal calculi, focal lesion, or hydronephrosis. Bladder is unremarkable. Stomach/Bowel: Gastric decompression tube extends into the stomach. Bowel shows no evidence of obstruction, ileus or perforation. No inflammation identified. There is moderate stool in the rectum.  Lymphatic: No enlarged lymph nodes identified. Reproductive: Prostate is unremarkable. Other: A right inguinal hernia contains fat. No ascites or abscess identified. Musculoskeletal: The lumbar spine demonstrates degenerative disc disease. No bony fractures or lesions identified. Review of the MIP images confirms the above findings. IMPRESSION: 1. No acute aortic pathology identified in the chest or abdomen. No vascular abnormalities identified. 2. Bibasilar opacities, right greater than left may represent atelectasis. Component of aspiration cannot be excluded, especially at the right lung base. 3. Coronary atherosclerosis with evidence of calcified plaque and/or stents in the LAD and RCA. 4. Right inguinal hernia containing fat. Electronically Signed   By: Aletta Edouard M.D.    On: 06/25/2018 08:42    EKG:    IMPRESSION AND PLAN:  Edwin Woodward  is a 67 y.o. male with a known history of coronary artery disease status post stent in the past, hypertension, hyperlipidemia, dementia/cognitive decline came to the emergency room by EMS after he started having some chest pain around 9 PM last night. Patient reported some shortness of breath nausea and dizziness.  1. G.I. Bleed/hypovolemic/hemorrhagic shock -patient intubated on the ventilator -admit to ICU -received two units of positive blood. Hemoglobin every eight hours. -IV Protonix drip -G.I. consultation. Dr. Allen Norris aware-- notified by ER physician. It will require G.I. evaluation once more stable -transfuse as needed -d/w ICU attending DR shah  2. hypokalemia hypotensive shock -patient's blood pressure drop in the 70s -IV fluids, IV levophed, transfusion as needed  3. Chest pain with history of coronary artery disease status post bare metal stent in the past -seen by cardiology -follow troponin -patient was on aspirin and Plavix  4. DVT prophylaxis SCD  5. History of dementia  Left message for the home phone number. Dr. Rockey Situ knows patient's ex- wife trying to reach her as well  All the records are reviewed and case discussed with ED provider. Management plans discussed with the patient, family and they are in agreement.  CODE STATUS: full  TOTAL CRITICAL TIME TAKING CARE OF THIS PATIENT: *55* minutes.    Fritzi Mandes M.D on 06/25/2018 at 8:44 AM  Between 7am to 6pm - Pager - [phone]  After 6pm go to [email or web address] - password [email or web address]  [facility]  Office  [phone]  CC: Primary care physician; [name], MD

## 2018-06-25 NOTE — ED Notes (Signed)
Pt returned from [place] with this RN and Janett Billow, RN. 2nd unit of emergency release blood started at this time.

## 2018-06-25 NOTE — ED Notes (Signed)
Dr. Audelia Acton at bedside.

## 2018-06-25 NOTE — ED Notes (Signed)
100mg  Ketamine administered.

## 2018-06-25 NOTE — ED Triage Notes (Signed)
Pt arrives to ED via [facility] from home with c/o non-radiating CP starting at 9pm last night. Pt also reports SHOB, nausea, dizziness, and diaphoresis. EMS reports giving pt 324 ASA en route. Pt has a h/x of cardiac stents placed 10 yrs ago. Dr Mable Paris at bedside upon pt's arrival to ED.

## 2018-06-25 NOTE — ED Notes (Signed)
16Fr OG tube placed by MD. Blood noted to be suctioned from OG tube at this time.

## 2018-06-25 NOTE — ED Notes (Signed)
70mg  Rocc administered.

## 2018-06-25 NOTE — Consult Note (Signed)
Edwin Woodward, M.D Pulmonary Critical Care & Sleep Medicine   ICU Patient Progress Note  Name: Edwin Woodward  DOB: 07-31-1951  MRN: [id number]  Date: 06/25/2018   '[x]' I have reviewed the flowsheet and previous day's notes.  IMPRESSION:   GI blood: GI evaluated the patient, suggested to continue Protonix drip, serial H&H and monitor accordingly, no plan for urgent endoscopy as per notes  No real evidence of STEMI history of CAD: Initial 2 troponins are negative, cardiology was not impressed with STEMI, overall clinical picture looks like more of vomiting due to GI bleed and not primary cardiac event, hold aspirin and Plavix hold heparin   Cardiac arrest most likely due to vasovagal episode and vomiting: Patient's primary event was vasovagal and no arrhythmia identified, code ice was not recommended by cardiology and will hold off on any kind of hypothermia protocol at this point as event most likely looks like due to respiratory arrest due to vomiting  Respiratory failure: Due to above continue current vent management, increased respiratory rate and tidal volume due to hypercarbia, monitor closely  Altered mental status-questionable due to sedation and CPR   PLAN:  CVS: Echo serial cardiac enzyme hold aspirin and Plavix, cardiology consult appreciated RS: Increase respiratory rate to 24, increase tidal volume to 500, repeat ABG in 2 hours -- Aspiration precaution -- Vent protocol on vented patient -Bronchodilators ID: No evidence of clear sepsis at this point, elevated lactate is due to poor hypoperfusion, continue with IV fluids and monitor -- Follow culture and adjust ENDO: Check TSH due to bradycardia -- SSI monitor blood sugar GI: Protonix drip, GI evaluation appreciated, if doing well start feeding tomorrow RENAL: Monitor for worsening renal failure -- Electrolyte replacement protocol, Monitor Cr and K  CNS: AMS noted on initial evaluation, currently sedated however will  get head CT to make sure there is no acute intracranial etiology going on and monitor accordingly HEMATOLOGY: Monitor H&H -- Monitor HB and PLT MUSCULOSKELETAL: Monitor CK PAIN AND SEDATION: DC Versed start on propofol use fentanyl as needed  Skin/Wound: Chronic changes  Electrolytes: Replace electrolytes per ICU electrolyte replacement protocol.   IVF: none  Nutrition: N.p.o. for now  Prophylaxis: DVT Prophylaxis with SCDs,. GI Prophylaxis.   Restraints: Soft limb  PT/OT eval and treat. OOB when appropriate.   Lines/Tubes: 06/25/2018 Foley 06/25/2018 left subclavian central line. ADVANCE DIRECTIVE: Full code FAMILY DISCUSSION: No family available at this point Quality Care: PPI, DVT prophylaxis, HOB elevated, Infection control all reviewed and addressed. Events and notes from last 24 hours reviewed. Care plan discussed on multidisciplinary rounds CC TIME: 45 minutes excluding procedures   CHIEF COMPLAINT:  patient currently is intubated on the ventilator. Marland Kitchen History obtained from medical records.  HISTORY OF PRESENT ILLNESS:  Edwin Woodward  is a 67 y.o. male with a known history of coronary artery disease status post stent in the past, hypertension, hyperlipidemia, dementia/cognitive decline came to the emergency room by EMS after he started having some chest pain around 9 PM last night. Patient reported some shortness of breath nausea and dizziness. EMS gave him aspirin this morning. There was some minimal EKG changes seen in lead 1 and avl and ER initiated code STEMI. Patient's blood pressure suddenly drop in the 70s and he started vomiting maroon/red blood. He got intubated on the ventilator currently on IV levophed, IV Protonix, fluids and received emergently two units of blood transfusion. He also received IV tranexamic acid.   Patient had a brief  10-second CPR done in the ER and was placed on epi drip -Was evaluated by cardiology who felt that acute events are not  related to STEMI, suggested serial troponins and follow-up and okay to hold aspirin and Plavix -Evaluated by GI who felt that no active GI bleed going on and suggested close monitoring and no emergent endoscopy - Patient also was taking a lot of tomato juice due to prostate problems as per the family and the notes -coronary artery disease,  bare-metal stents placed to his proximal RCA and LAD in April 2009,  Subjective: Currently intubated and sedated, emergent central line was placed as patient was on Levophed, repeat labs were sent which showed hemoglobin improved to 12.6 after 2 units of blood transfusion, blood sugar is around 131, troponin is 0.03, -Currently managed with Protonix drip, fentanyl, Versed, was on Levophed which is on hold now because of high blood pressure - Initial blood gas post intubation showed pH of 7.25 with PCO2 49 -Lactate was mildly elevated to 2.1  -CT chest abdomen pelvis was done:IMPRESSION: 1. No acute aortic pathology identified in the chest or abdomen. No vascular abnormalities identified. 2. Bibasilar opacities, right greater than left may represent atelectasis. Component of aspiration cannot be excluded, especially at the right lung base. 3. Coronary atherosclerosis with evidence of calcified plaque and/or stents in the LAD and RCA. 4. Right inguinal hernia containing fat.  '[]' The patient is critically ill on     '[x]' Mechanical ventilation '[x]' Pressors  '[]' BiPAP '[]'     pantoprazole 40 mg Once  [START ON 06/28/2018] pantoprazole 40 mg Q12H    ALLERGIES: Allergies  Allergen Reactions  . Biaxin [Clarithromycin]   . Clarithromycin   . Codeine     Sever vomiting   . Penicillins   . Promethazine   . Iodinated Diagnostic Agents Rash  . Metrizamide Rash    PAST MEDICAL HISTORY:   Past Medical History:  Diagnosis Date  . CAD (coronary artery disease)   . Cardiovascular stress test abnormal    Jan 2010 no significant ischemia  . Hyperlipidemia    . Hypertension   . Presence of bare metal stent in right coronary artery    proximal RCA adn LAD in April 2009  . Trigger finger     PAST SURGICAL HISTORY: Past Surgical History:  Procedure Laterality Date  . APPENDECTOMY    . APPENDECTOMY    . CARPAL TUNNEL RELEASE    . CATARACT EXTRACTION     right  . CORONARY ANGIOPLASTY  12/2007  . CORONARY ARTERY BYPASS GRAFT     2 vessel  . JOINT REPLACEMENT     total knee  . REPLACEMENT TOTAL KNEE    . TONSILLECTOMY    . VASECTOMY      PAST SOCIAL HISTORY: Social History   Socioeconomic History  . Marital status: Married    Spouse name: Not on file  . Number of children: Not on file  . Years of education: Not on file  . Highest education level: Not on file  Occupational History  . Not on file  Social Needs  . Financial resource strain: Not on file  . Food insecurity:    Worry: Not on file    Inability: Not on file  . Transportation needs:    Medical: Not on file    Non-medical: Not on file  Tobacco Use  . Smoking status: Former Research scientist (life sciences)  . Smokeless tobacco: Never Used  Substance and Sexual Activity  . Alcohol use: No  Alcohol/week: 0.0 standard drinks  . Drug use: No    Types: Marijuana  . Sexual activity: Not on file  Lifestyle  . Physical activity:    Days per week: Not on file    Minutes per session: Not on file  . Stress: Not on file  Relationships  . Social connections:    Talks on phone: Not on file    Gets together: Not on file    Attends religious service: Not on file    Active member of club or organization: Not on file    Attends meetings of clubs or organizations: Not on file    Relationship status: Not on file  . Intimate partner violence:    Fear of current or ex partner: Not on file    Emotionally abused: Not on file    Physically abused: Not on file    Forced sexual activity: Not on file  Other Topics Concern  . Not on file  Social History Narrative   Lives with wife.    FAMILY  HISTORY: Family History  Problem Relation Age of Onset  . Hypertension Mother   . Diabetes Mother   . Thalassemia Mother   . Heart disease Father     Vital Signs:   BP 133/87   Pulse 70   Temp 97.9 F (36.6 C) (Oral)   Resp (!) 23   Ht '5\' 3"'  (1.6 m)   Wt 79.8 kg   SpO2 100%   BMI 31.18 kg/m       Prehospital Care O2 Device: None (Room air) Cardiac Rhythm: Normal sinus rhythm  Temp (24hrs), Avg:97.9 F (36.6 C), Min:97.9 F (36.6 C), Max:97.9 F (36.6 C)   Intake/Output:Last shift:      10/18 0701 - 10/18 1900 In: -  Out: 100 Last 3 shifts: No intake/output data recorded.  Intake/Output Summary (Last 24 hours) at 06/25/2018 1247 Last data filed at 06/25/2018 0858 Gross per 24 hour  Intake -  Output 100 ml  Net -100 ml      Objective:   Physical Exam:  General: comfortable, no acute distress HEENT:  sclera anicteric, EOM intact Neck: No adenopathy or thyroid swelling, no lymphadenopathy or JVD, supple CVS: S1S2 no murmurs RS: Mod AE bilaterally, no tactile fremitus or egophony, no accessory muscle use Abd: soft, non tender, no hepatosplenomegaly Neuro: Sedated and intubated Extrm: no leg edema, clubbing or cyanosis Lymph node: No obvious palpable lymph node appreciated. Skin: no rash  DATA:   CBC w/Diff Recent Labs  Lab 06/25/18 0654 06/25/18 1015  HGB 10.6* 12.6*  HCT 33.8* 38.5*  WBC 12.4* 17.9*  PLT 278 250     Chemistry Recent Labs  Lab 06/25/18 0654  NA 138  K 4.6  CL 105  CO2 22  BUN 57*  CREATININE 1.20  GLUCOSE 141*    Hepatic Function Latest Ref Rng & Units 06/25/2018 06/25/2018 12/04/2017  Total Protein 6.5 - 8.1 g/dL 6.5 6.7 6.3  Albumin 3.5 - 5.0 g/dL 3.5 3.8 -  AST 15 - 41 U/L '24 25 24  ' ALT 0 - 44 U/L '23 25 20  ' Alk Phosphatase 38 - 126 U/L 61 63 -  Total Bilirubin 0.3 - 1.2 mg/dL 0.9 1.0 0.5  Bilirubin, Direct 0.0 - 0.2 mg/dL <0.1 - -     Lactic Acid    Component Value Date/Time   LATICACIDVEN 2.1 (HH) 06/25/2018  1015     Micro  '@MICRO' @ Recent Results (from the past 720 hour(s))  MRSA PCR Screening     Status: None   Collection Time: 06/25/18  9:25 AM  Result Value Ref Range Status   MRSA by PCR NEGATIVE NEGATIVE Final    Comment:        The GeneXpert MRSA Assay (FDA approved for NASAL specimens only), is one component of a comprehensive MRSA colonization surveillance program. It is not intended to diagnose MRSA infection nor to guide or monitor treatment for MRSA infections. Performed at [facility], [address] [place], [place] [address]      ABG    Component Value Date/Time   PHART 7.25 (L) 06/25/2018 1023   PCO2ART 49 (H) 06/25/2018 1023   PO2ART 113 (H) 06/25/2018 1023   HCO3 21.5 06/25/2018 1023   ACIDBASEDEF 5.9 (H) 06/25/2018 1023   O2SAT 97.6 06/25/2018 1023     Cardiac Enzymes Recent Labs    06/25/18 0654 06/25/18 1015  TROPONINI <0.03 0.03*      Coagulation Lab Results  Component Value Date   INR 2.2 (H) 05/18/2009   APTT 28 05/08/2009     Radiology Results Reviewed by me and compared with previous imaging studies Dg Chest Port 1 View  Result Date: 06/25/2018 CLINICAL DATA:  Central line placement EXAM: PORTABLE CHEST 1 VIEW COMPARISON:  06/25/2018 FINDINGS: Left subclavian central line tip SVC RA junction level. Endotracheal tube remains 3.4 cm above the carina. NG tube within the stomach. Defibrillator pads overlie the chest. Low lung volumes persist with scattered atelectasis. No focal collapse or consolidation. No large effusion or pneumothorax. IMPRESSION: Left subclavian central line tip SVC RA junction level. Lower lung volumes with increased atelectasis Other support apparatus stable. Electronically Signed   By: Jerilynn Mages.  Shick M.D.   On: 06/25/2018 10:27   Dg Chest Portable 1 View  Result Date: 06/25/2018 CLINICAL DATA:  Intubation. EXAM: PORTABLE CHEST 1 VIEW COMPARISON:  06/25/2018. FINDINGS: Endotracheal tube noted with tip 3 cm above the  carina. NG tube noted with tip below left hemidiaphragm. Heart size normal. Mild bibasilar atelectasis/infiltrates. Tiny left pleural effusion. No pneumothorax. No acute bony abnormality. IMPRESSION: 1. Endotracheal tube noted with tip 3 cm above the carina. NG tube noted with tip below left hemidiaphragm. 2. Mild bibasilar atelectasis/infiltrates. Tiny left pleural effusion. Electronically Signed   By: Marcello Moores  Register   On: 06/25/2018 08:05   Dg Chest Port 1 View  Result Date: 06/25/2018 CLINICAL DATA:  Chest pain for several hours EXAM: PORTABLE CHEST 1 VIEW COMPARISON:  12/04/2017 FINDINGS: The heart size and mediastinal contours are within normal limits. Both lungs are clear. The visualized skeletal structures are unremarkable. IMPRESSION: No active disease. Electronically Signed   By: Inez Catalina M.D.   On: 06/25/2018 07:24   Ct Angio Chest/abd/pel For Dissection W And/or W/wo  Result Date: 06/25/2018 CLINICAL DATA:  Chest pain, shortness of breath, nausea, dizziness and diaphoresis. Now intubated EXAM: CT ANGIOGRAPHY CHEST, ABDOMEN AND PELVIS TECHNIQUE: Multidetector CT imaging through the chest, abdomen and pelvis was performed using the standard protocol during bolus administration of intravenous contrast. Multiplanar reconstructed images and MIPs were obtained and reviewed to evaluate the vascular anatomy. CONTRAST:  196m ISOVUE-370 IOPAMIDOL (ISOVUE-370) INJECTION 76% COMPARISON:  Chest x-ray today. FINDINGS: CTA CHEST FINDINGS Cardiovascular: The thoracic aorta is normal in caliber and well opacified. No evidence of aneurysmal disease or aortic dissection. No significant atherosclerosis identified of the aorta or visualized proximal great vessels. The heart size is normal. No pericardial fluid identified. Central pulmonary arteries are  of normal caliber. Calcified plaque versus stent present in the distribution of the LAD. There likely also is plaque versus stent in the distribution of the  RCA. Mediastinum/Nodes: Endotracheal tube present. No enlarged mediastinal, hilar, or axillary lymph nodes. Thyroid gland, trachea, and esophagus demonstrate no significant findings. Lungs/Pleura: Posterior bibasilar opacities present, right greater than left most likely representing atelectasis. A component of possible aspiration is not excluded. There is no evidence of airspace edema, pneumothorax, nodule or pleural fluid. Musculoskeletal: No chest wall abnormality. No acute or significant osseous findings. Review of the MIP images confirms the above findings. CTA ABDOMEN AND PELVIS FINDINGS VASCULAR Aorta: The abdominal aorta is normally patent and demonstrates mild atherosclerotic plaque. No evidence of aneurysmal disease or dissection. Celiac: Normally patent.  Normal distal branch vessel anatomy. SMA: Normally patent. Renals: Bilateral single renal arteries demonstrate normal patency. IMA: Normally patent. Inflow: Bilateral iliac arteries demonstrate normal patency. Common femoral arteries and femoral bifurcations are normally patent. Review of the MIP images confirms the above findings. NON-VASCULAR Hepatobiliary: No focal liver abnormality is seen. No gallstones, gallbladder wall thickening, or biliary dilatation. Pancreas: Unremarkable. No pancreatic ductal dilatation or surrounding inflammatory changes. Spleen: Normal in size without focal abnormality. Adrenals/Urinary Tract: Adrenal glands are unremarkable. Kidneys are normal, without renal calculi, focal lesion, or hydronephrosis. Bladder is unremarkable. Stomach/Bowel: Gastric decompression tube extends into the stomach. Bowel shows no evidence of obstruction, ileus or perforation. No inflammation identified. There is moderate stool in the rectum. Lymphatic: No enlarged lymph nodes identified. Reproductive: Prostate is unremarkable. Other: A right inguinal hernia contains fat. No ascites or abscess identified. Musculoskeletal: The lumbar spine  demonstrates degenerative disc disease. No bony fractures or lesions identified. Review of the MIP images confirms the above findings. IMPRESSION: 1. No acute aortic pathology identified in the chest or abdomen. No vascular abnormalities identified. 2. Bibasilar opacities, right greater than left may represent atelectasis. Component of aspiration cannot be excluded, especially at the right lung base. 3. Coronary atherosclerosis with evidence of calcified plaque and/or stents in the LAD and RCA. 4. Right inguinal hernia containing fat. Electronically Signed   By: Aletta Edouard M.D.   On: 06/25/2018 08:42      ECHO    Edwin Rocker, MD 06/25/2018 '@NOWR' @   The patient is: '[x]'  acutely ill Risk of deterioration: '[]'  moderate   '[x]'  critically ill  '[]'  high   '[x]' See my orders for details  My assessment, plan of care, findings, medications, side effects etc were discussed with: '[x]' nursing '[]' PT/OT   '[x]' respiratory therapy '[]' Dr.  '[]' family '[]'    Sedonia Small, M.D. Pulmonary Critical Care & Sleep Medicine

## 2018-06-25 NOTE — Progress Notes (Signed)
Patient's ABG pH was 7.25 PCO2 of 49-I was not informed about patient's ABG results I just noticed that in the chart, I increase her respiratory rate to 24 increase her tidal volume to 500 we will repeat another blood gas at 2 PM and adjust accordingly  Edwin Woodward [facility] Pulmonary Critical Care & Sleep Medicine

## 2018-06-26 ENCOUNTER — Inpatient Hospital Stay (HOSPITAL_COMMUNITY)
Admit: 2018-06-26 | Discharge: 2018-06-26 | Disposition: A | Discharge status: Home / Self Care | Payer: Medicare HMO | Attending: Cardiovascular Disease | Admitting: Cardiovascular Disease

## 2018-06-26 ENCOUNTER — Inpatient Hospital Stay: Payer: Medicare HMO

## 2018-06-26 DIAGNOSIS — K922 Gastrointestinal hemorrhage, unspecified: Secondary | ICD-10-CM

## 2018-06-26 DIAGNOSIS — K920 Hematemesis: Secondary | ICD-10-CM

## 2018-06-26 DIAGNOSIS — R0602 Shortness of breath: Secondary | ICD-10-CM

## 2018-06-26 DIAGNOSIS — I213 ST elevation (STEMI) myocardial infarction of unspecified site: Secondary | ICD-10-CM

## 2018-06-26 DIAGNOSIS — I200 Unstable angina: Secondary | ICD-10-CM

## 2018-06-26 LAB — CBC WITH DIFFERENTIAL/PLATELET
ABS IMMATURE GRANULOCYTES: 0.11 10*3/uL — AB (ref 0.00–0.07)
BASOS ABS: 0 10*3/uL (ref 0.0–0.1)
BASOS PCT: 0 %
EOS ABS: 0 10*3/uL (ref 0.0–0.5)
Eosinophils Relative: 0 %
HCT: 35.1 % — ABNORMAL LOW (ref 39.0–52.0)
Hemoglobin: 11.6 g/dL — ABNORMAL LOW (ref 13.0–17.0)
IMMATURE GRANULOCYTES: 1 %
Lymphocytes Relative: 8 %
Lymphs Abs: 1.4 10*3/uL (ref 0.7–4.0)
MCH: 23.5 pg — ABNORMAL LOW (ref 26.0–34.0)
MCHC: 33 g/dL (ref 30.0–36.0)
MCV: 71.1 fL — ABNORMAL LOW (ref 80.0–100.0)
Monocytes Absolute: 1.5 10*3/uL — ABNORMAL HIGH (ref 0.1–1.0)
Monocytes Relative: 8 %
NEUTROS ABS: 15.7 10*3/uL — AB (ref 1.7–7.7)
NEUTROS PCT: 83 %
NRBC: 0.3 % — AB (ref 0.0–0.2)
PLATELETS: 212 10*3/uL (ref 150–400)
RBC: 4.94 MIL/uL (ref 4.22–5.81)
RDW: 20.3 % — AB (ref 11.5–15.5)
WBC: 18.7 10*3/uL — ABNORMAL HIGH (ref 4.0–10.5)

## 2018-06-26 LAB — PREPARE FRESH FROZEN PLASMA

## 2018-06-26 LAB — COMPREHENSIVE METABOLIC PANEL
ALBUMIN: 3.5 g/dL (ref 3.5–5.0)
ALT: 20 U/L (ref 0–44)
AST: 23 U/L (ref 15–41)
Alkaline Phosphatase: 53 U/L (ref 38–126)
Anion gap: 7 (ref 5–15)
BUN: 43 mg/dL — AB (ref 8–23)
CHLORIDE: 111 mmol/L (ref 98–111)
CO2: 24 mmol/L (ref 22–32)
Calcium: 8.4 mg/dL — ABNORMAL LOW (ref 8.9–10.3)
Creatinine, Ser: 1.39 mg/dL — ABNORMAL HIGH (ref 0.61–1.24)
GFR calc Af Amer: 59 mL/min — ABNORMAL LOW (ref >60)
GFR calc non Af Amer: 51 mL/min — ABNORMAL LOW (ref >60)
GLUCOSE: 126 mg/dL — AB (ref 70–99)
POTASSIUM: 4.6 mmol/L (ref 3.5–5.1)
Sodium: 142 mmol/L (ref 135–145)
Total Bilirubin: 0.8 mg/dL (ref 0.3–1.2)
Total Protein: 5.9 g/dL — ABNORMAL LOW (ref 6.5–8.1)

## 2018-06-26 LAB — BPAM FFP
Blood Product Expiration Date: 201910232359
Unit Type and Rh: 2800

## 2018-06-26 LAB — PREPARE RBC (CROSSMATCH)

## 2018-06-26 LAB — BLOOD GAS, ARTERIAL
Acid-base deficit: 3.6 mmol/L — ABNORMAL HIGH (ref 0.0–2.0)
BICARBONATE: 21.5 mmol/L (ref 20.0–28.0)
FIO2: 0.35
MECHANICAL RATE: 24
MECHVT: 500 mL
O2 SAT: 96.5 %
PATIENT TEMPERATURE: 37
PEEP/CPAP: 5 cmH2O
PH ART: 7.36 (ref 7.350–7.450)
PO2 ART: 89 mmHg (ref 83.0–108.0)
pCO2 arterial: 38 mmHg (ref 32.0–48.0)

## 2018-06-26 LAB — GLUCOSE, CAPILLARY: Glucose-Capillary: 155 mg/dL — ABNORMAL HIGH (ref 70–99)

## 2018-06-26 LAB — LACTIC ACID, PLASMA: LACTIC ACID, VENOUS: 2.2 mmol/L — AB (ref 0.5–1.9)

## 2018-06-26 LAB — PROTIME-INR
INR: 1.16
PROTHROMBIN TIME: 14.7 s (ref 11.4–15.2)

## 2018-06-26 LAB — MAGNESIUM: MAGNESIUM: 1.9 mg/dL (ref 1.7–2.4)

## 2018-06-26 LAB — CK TOTAL AND CKMB (NOT AT ARMC)
CK, MB: 2.2 ng/mL (ref 0.5–5.0)
Relative Index: INVALID (ref 0.0–2.5)
Total CK: 33 U/L — ABNORMAL LOW (ref 49–397)

## 2018-06-26 LAB — HIV ANTIBODY (ROUTINE TESTING W REFLEX): HIV Screen 4th Generation wRfx: NONREACTIVE

## 2018-06-26 LAB — PHOSPHORUS: PHOSPHORUS: 4.2 mg/dL (ref 2.5–4.6)

## 2018-06-26 LAB — TROPONIN I: Troponin I: <0.03 ng/mL (ref <0.03)

## 2018-06-26 LAB — HEMOGLOBIN: Hemoglobin: 11.8 g/dL — ABNORMAL LOW (ref 13.0–17.0)

## 2018-06-26 MED ORDER — ORAL CARE MOUTH RINSE
15.0000 mL | Freq: Two times a day (BID) | OROMUCOSAL | Status: DC
  Administered 2018-06-27 – 2018-06-30 (×5): 15 mL via OROMUCOSAL

## 2018-06-26 MED ORDER — PIPERACILLIN-TAZOBACTAM 3.375 G IVPB
3.3750 g | Freq: Three times a day (TID) | INTRAVENOUS | Status: DC
  Administered 2018-06-26 – 2018-06-28 (×6): 3.375 g via INTRAVENOUS
  Filled 2018-06-26 (×7): qty 50

## 2018-06-26 MED ORDER — INFLUENZA VAC SPLIT HIGH-DOSE 0.5 ML IM SUSY
0.5000 mL | PREFILLED_SYRINGE | INTRAMUSCULAR | Status: AC
  Administered 2018-06-27: 0.5 mL via INTRAMUSCULAR
  Filled 2018-06-26: qty 0.5

## 2018-06-26 MED ORDER — PNEUMOCOCCAL VAC POLYVALENT 25 MCG/0.5ML IJ INJ
0.5000 mL | INJECTION | INTRAMUSCULAR | Status: AC
  Administered 2018-06-27: 0.5 mL via INTRAMUSCULAR
  Filled 2018-06-26: qty 0.5

## 2018-06-26 MED FILL — Medication: Qty: 1 | Status: AC

## 2018-06-26 NOTE — Progress Notes (Signed)
Pt awake and oriented all day,  Wake up assessment done at  8 am  and all sedation off, extubated at 11 am, OG tube removed, ( 25 mls of coffee ground emesis noted) and foley removed.  At 4 pm patient felt that he needed to void, but stated was unable to do so in bed, RN and NT helped pt to stand , after dangling on side of bed, Pt expressed no complaints but HR inc to 416, BP 606 systolic. Pt still unabe to void, but helped back to bed. Bladder scan done,335 mls found in bladder.  Md notified of  1/ Inability to void- in /out catheter order noted and performed and 300 mls returned.  2/ HR increase to 145, which resolved back to 98 on rest 3/ increase in shakiness/tremors. Pt states that these are normal for him, and that he takes no meds for it.

## 2018-06-26 NOTE — Progress Notes (Signed)
Pharmacy Antibiotic Note  Edwin Woodward is a 67 y.o. male admitted on 06/25/2018 with Intra-abdominal infection.  Pharmacy has been consulted for Zosyn dosing.  Plan: Zosyn 3.375g IV q8h (4 hour infusion).  Penicillin allergy noted (no reaction is documented).  Height: 5\' 3"  (160 cm) Weight: 176 lb (79.8 kg) IBW/kg (Calculated) : 56.9  Temp (24hrs), Avg:98.6 F (37 C), Min:98.4 F (36.9 C), Max:98.8 F (37.1 C)  Recent Labs  Lab 06/25/18 0654 06/25/18 1015 06/25/18 1412 06/25/18 2254 06/26/18 0406  WBC 12.4* 17.9*  --  16.9* 18.7*  CREATININE 1.20  --   --   --  1.39*  LATICACIDVEN  --  2.1* 1.4  --  2.2*    Estimated Creatinine Clearance: 47.6 mL/min (A) (by C-G formula based on SCr of 1.39 mg/dL (H)).    Allergies  Allergen Reactions  . Biaxin [Clarithromycin]   . Clarithromycin   . Codeine     Sever vomiting   . Penicillins   . Promethazine   . Iodinated Diagnostic Agents Rash  . Metrizamide Rash    Antimicrobials this admission: 10/19 Zosyn >>   Dose adjustments this admission: N/A  Microbiology results: 10/18 MRSA PCR: negative  Thank you for allowing pharmacy to be a part of this patient's care.  Olivia Canter, [facility] 06/26/2018 7:54 AM

## 2018-06-26 NOTE — Progress Notes (Signed)
     Subjective:    Patient ID: Edwin Woodward, male    DOB: 08-26-1951, 67 y.o.   MRN: [id number]  HPI Awake and alert, on sedation vacation, follows commands. No overt distress noted.   Review of Systems  Unable to perform ROS: Intubated    Ventilator Settings: Vent Mode: Spontaneous FiO2 (%):  [35 %] 35 % Set Rate:  [24 bmp] 24 bmp Vt Set:  [500 mL] 500 mL PEEP:  [5 cmH20] 5 cmH20 Pressure Support:  [10 cmH20] 10 cmH20 Plateau Pressure:  [16 cmH20-17 cmH20] 16 cmH20   Intake/Output: Total I/O In: -  Out: 200 [Urine:200]   Vitals: Vitals:   06/26/18 1100 06/26/18 1200  BP: (!) 148/91 120/78  Pulse: (!) 103 (!) 112  Resp:    Temp:  98.8 F (37.1 C)  SpO2:      Objective:   Physical Exam  Constitutional: He appears well-developed and well-nourished. He does not appear ill. No distress.  Intubated, on sedation vacation, follows commands.  HENT:  Head: Normocephalic and atraumatic.  Eyes: Pupils are equal, round, and reactive to light.  Neck: Normal range of motion. Neck supple. No JVD present. No tracheal deviation present.  Cardiovascular: Regular rhythm and intact distal pulses. Exam reveals no gallop, no S3 and no S4.  No murmur heard.  No systolic murmur is present.  No diastolic murmur is present. Slight tachycardia  Pulmonary/Chest: Effort normal and breath sounds normal.  Abdominal: Soft. Bowel sounds are normal. He exhibits no distension. There is no tenderness.  Musculoskeletal: Normal range of motion.       Right lower leg: He exhibits no edema.       Left lower leg: He exhibits no edema.  Neurological: He is alert.  Currently intubated cannot assess orientation.  Skin: Skin is warm and dry. No rash noted. No erythema.  Psychiatric: His mood appears not anxious. He is not agitated.    Laboratory data has been reviewed. Arterial blood gases show a pH of 7.36, PCO2 of 38, PaO2 of 89 on FiO2 0.35. Chest x-ray shows no overt infiltrate. ETT in  good position. Sputum culture pending. Chemistry panel has been reviewed CBC shows H/H 11.6 and 35.1   Assessment & Plan:   1) Acute respiratory failure likely due to an episode of vomiting with some chemical pneumonitis from the same. No infiltrate to suggest ongoing pneumonia at present. The patient is tolerating spontaneous breathing trial and has good air leak noted on deflation of the ETcuff. Proceed with extubation. Oxygen supplementation with nasal cannula to maintain saturations at 90% or better.  2) Transient cardiac arrest likely due to vasovagal response from vomiting. No arrhythmia identified. No evidence of acute MI. Follow-up echo results.  3) Aspiration pneumonitis, he does not have active infiltrate to suggest pneumonia. However will wait until the final microbiology data from the sputum is back. To antibiotics for now.  4) G.I. bleed: it appears that he may have had an upper G.I. bleed likely related to stress gastritis. He is currently on a Protonix trip however no evidence of further episode of G.I. blood loss, can likely transition to 40 mg Protonix twice a day.  5) Altered mental status: the patient appears to be less agitated and altered today. He is actually following commands.    Coordination of care was performed with bedside nurse and RT. Plan for extubation.  Total critical care time 35 minutes.

## 2018-06-26 NOTE — Progress Notes (Signed)
Progress Note  Patient Name: Edwin Woodward Date of Encounter: 06/26/2018  Primary Cardiologist: Dr. Rockey Situ  Subjective   The patient is still intubated but he is alert and responsive.  He nods his head yes when asked about chest pain.  No abdominal pain.  His troponin continues to be negative.  Inpatient Medications    Scheduled Meds: . chlorhexidine gluconate (MEDLINE KIT)  15 mL Mouth Rinse BID  . ipratropium-albuterol  3 mL Nebulization Q6H  . mouth rinse  15 mL Mouth Rinse 10 times per day  . pantoprazole  40 mg Intravenous Once  . [START ON 06/28/2018] pantoprazole  40 mg Intravenous Q12H   Continuous Infusions: . sodium chloride 50 mL/hr at 06/26/18 0600  . fentaNYL infusion INTRAVENOUS 125 mcg/hr (06/26/18 0600)  . pantoprozole (PROTONIX) infusion 8 mg/hr (06/26/18 0600)  . piperacillin-tazobactam (ZOSYN)  IV    . propofol (DIPRIVAN) infusion 25 mcg/kg/min (06/26/18 0600)   PRN Meds: acetaminophen **OR** acetaminophen, nitroGLYCERIN   Vital Signs    Vitals:   06/26/18 0500 06/26/18 0600 06/26/18 0800 06/26/18 0900  BP: 101/75 108/71 101/65 123/79  Pulse: 87 84 83 91  Resp: (!) 24 (!) 24    Temp:   98.4 F (36.9 C)   TempSrc:      SpO2: 97% 96% 96% 96%  Weight:      Height:        Intake/Output Summary (Last 24 hours) at 06/26/2018 0920 Last data filed at 06/26/2018 0600 Gross per 24 hour  Intake 1736.9 ml  Output 705 ml  Net 1031.9 ml   Filed Weights   06/25/18 0650  Weight: 79.8 kg    Telemetry    Normal sinus rhythm- Personally Reviewed  ECG     - Personally Reviewed  Physical Exam   GEN: No acute distress.   Neck: No JVD Cardiac: RRR, no murmurs, rubs, or gallops.  Respiratory: Clear to auscultation bilaterally. GI: Soft, nontender, non-distended  MS: No edema; No deformity. Neuro:  Nonfocal  Psych: Normal affect   Labs    Chemistry Recent Labs  Lab 06/25/18 0654 06/25/18 1015 06/26/18 0406  NA 138  --  142  K 4.6   --  4.6  CL 105  --  111  CO2 22  --  24  GLUCOSE 141*  --  126*  BUN 57*  --  43*  CREATININE 1.20  --  1.39*  CALCIUM 9.1  --  8.4*  PROT 6.7 6.5 5.9*  ALBUMIN 3.8 3.5 3.5  AST '25 24 23  ' ALT '25 23 20  ' ALKPHOS 63 61 53  BILITOT 1.0 0.9 0.8  GFRNONAA >60  --  51*  GFRAA >60  --  59*  ANIONGAP 11  --  7     Hematology Recent Labs  Lab 06/25/18 1015  06/25/18 2254 06/26/18 0037 06/26/18 0406  WBC 17.9*  --  16.9*  --  18.7*  RBC 5.40  --  5.14  --  4.94  HGB 12.6*   < > 12.0* 11.8* 11.6*  HCT 38.5*  --  36.3*  --  35.1*  MCV 71.3*  --  70.6*  --  71.1*  MCH 23.3*  --  23.3*  --  23.5*  MCHC 32.7  --  33.1  --  33.0  RDW 20.8*  --  20.1*  --  20.3*  PLT 250  --  227  --  212   < > = values in this interval  not displayed.    Cardiac Enzymes Recent Labs  Lab 06/25/18 0654 06/25/18 1015 06/26/18 0406  TROPONINI <0.03 0.03* <0.03   No results for input(s): TROPIPOC in the last 168 hours.   BNP Recent Labs  Lab 06/25/18 0654  BNP 17.0     DDimer No results for input(s): DDIMER in the last 168 hours.   Radiology    Ct Head Wo Contrast  Result Date: 06/25/2018 CLINICAL DATA:  Encephalopathy, cardiac arrest EXAM: CT HEAD WITHOUT CONTRAST TECHNIQUE: Contiguous axial images were obtained from the base of the skull through the vertex without intravenous contrast. COMPARISON:  02/09/2017 FINDINGS: Brain: No evidence of acute infarction, hemorrhage, extra-axial collection, ventriculomegaly, or mass effect. Small left choroidal fissure cyst. Generalized cerebral atrophy. Periventricular white matter low attenuation likely secondary to microangiopathy. Vascular: Cerebrovascular atherosclerotic calcifications are noted. Skull: Negative for fracture or focal lesion. Sinuses/Orbits: Visualized portions of the orbits are unremarkable. Visualized portions of the paranasal sinuses are unremarkable. Visualized portions of the mastoid air cells are unremarkable. Other: None.  IMPRESSION: No acute intracranial pathology. Electronically Signed   By: Kathreen Devoid   On: 06/25/2018 13:54   Dg Chest Port 1 View  Result Date: 06/26/2018 CLINICAL DATA:  Shortness of breath. EXAM: PORTABLE CHEST 1 VIEW COMPARISON:  One-view chest x-ray 06/25/2018 FINDINGS: Heart size is normal. Endotracheal tube terminates 3 cm above the carina. A left subclavian line is stable. NG tube courses off the inferior border the film. Defibrillator pads are in place. Lung volumes are improved. No focal airspace disease is present. IMPRESSION: 1. Support apparatus stable. 2. Improving lung volumes and aeration. Electronically Signed   By: San Morelle M.D.   On: 06/26/2018 07:14   Dg Chest Port 1 View  Result Date: 06/25/2018 CLINICAL DATA:  Central line placement EXAM: PORTABLE CHEST 1 VIEW COMPARISON:  06/25/2018 FINDINGS: Left subclavian central line tip SVC RA junction level. Endotracheal tube remains 3.4 cm above the carina. NG tube within the stomach. Defibrillator pads overlie the chest. Low lung volumes persist with scattered atelectasis. No focal collapse or consolidation. No large effusion or pneumothorax. IMPRESSION: Left subclavian central line tip SVC RA junction level. Lower lung volumes with increased atelectasis Other support apparatus stable. Electronically Signed   By: Jerilynn Mages.  Shick M.D.   On: 06/25/2018 10:27   Dg Chest Portable 1 View  Result Date: 06/25/2018 CLINICAL DATA:  Intubation. EXAM: PORTABLE CHEST 1 VIEW COMPARISON:  06/25/2018. FINDINGS: Endotracheal tube noted with tip 3 cm above the carina. NG tube noted with tip below left hemidiaphragm. Heart size normal. Mild bibasilar atelectasis/infiltrates. Tiny left pleural effusion. No pneumothorax. No acute bony abnormality. IMPRESSION: 1. Endotracheal tube noted with tip 3 cm above the carina. NG tube noted with tip below left hemidiaphragm. 2. Mild bibasilar atelectasis/infiltrates. Tiny left pleural effusion. Electronically  Signed   By: Marcello Moores  Register   On: 06/25/2018 08:05   Dg Chest Port 1 View  Result Date: 06/25/2018 CLINICAL DATA:  Chest pain for several hours EXAM: PORTABLE CHEST 1 VIEW COMPARISON:  12/04/2017 FINDINGS: The heart size and mediastinal contours are within normal limits. Both lungs are clear. The visualized skeletal structures are unremarkable. IMPRESSION: No active disease. Electronically Signed   By: Inez Catalina M.D.   On: 06/25/2018 07:24   Ct Angio Chest/abd/pel For Dissection W And/or W/wo  Result Date: 06/25/2018 CLINICAL DATA:  Chest pain, shortness of breath, nausea, dizziness and diaphoresis. Now intubated EXAM: CT ANGIOGRAPHY CHEST, ABDOMEN AND PELVIS TECHNIQUE: Multidetector  CT imaging through the chest, abdomen and pelvis was performed using the standard protocol during bolus administration of intravenous contrast. Multiplanar reconstructed images and MIPs were obtained and reviewed to evaluate the vascular anatomy. CONTRAST:  113m ISOVUE-370 IOPAMIDOL (ISOVUE-370) INJECTION 76% COMPARISON:  Chest x-ray today. FINDINGS: CTA CHEST FINDINGS Cardiovascular: The thoracic aorta is normal in caliber and well opacified. No evidence of aneurysmal disease or aortic dissection. No significant atherosclerosis identified of the aorta or visualized proximal great vessels. The heart size is normal. No pericardial fluid identified. Central pulmonary arteries are of normal caliber. Calcified plaque versus stent present in the distribution of the LAD. There likely also is plaque versus stent in the distribution of the RCA. Mediastinum/Nodes: Endotracheal tube present. No enlarged mediastinal, hilar, or axillary lymph nodes. Thyroid gland, trachea, and esophagus demonstrate no significant findings. Lungs/Pleura: Posterior bibasilar opacities present, right greater than left most likely representing atelectasis. A component of possible aspiration is not excluded. There is no evidence of airspace edema,  pneumothorax, nodule or pleural fluid. Musculoskeletal: No chest wall abnormality. No acute or significant osseous findings. Review of the MIP images confirms the above findings. CTA ABDOMEN AND PELVIS FINDINGS VASCULAR Aorta: The abdominal aorta is normally patent and demonstrates mild atherosclerotic plaque. No evidence of aneurysmal disease or dissection. Celiac: Normally patent.  Normal distal branch vessel anatomy. SMA: Normally patent. Renals: Bilateral single renal arteries demonstrate normal patency. IMA: Normally patent. Inflow: Bilateral iliac arteries demonstrate normal patency. Common femoral arteries and femoral bifurcations are normally patent. Review of the MIP images confirms the above findings. NON-VASCULAR Hepatobiliary: No focal liver abnormality is seen. No gallstones, gallbladder wall thickening, or biliary dilatation. Pancreas: Unremarkable. No pancreatic ductal dilatation or surrounding inflammatory changes. Spleen: Normal in size without focal abnormality. Adrenals/Urinary Tract: Adrenal glands are unremarkable. Kidneys are normal, without renal calculi, focal lesion, or hydronephrosis. Bladder is unremarkable. Stomach/Bowel: Gastric decompression tube extends into the stomach. Bowel shows no evidence of obstruction, ileus or perforation. No inflammation identified. There is moderate stool in the rectum. Lymphatic: No enlarged lymph nodes identified. Reproductive: Prostate is unremarkable. Other: A right inguinal hernia contains fat. No ascites or abscess identified. Musculoskeletal: The lumbar spine demonstrates degenerative disc disease. No bony fractures or lesions identified. Review of the MIP images confirms the above findings. IMPRESSION: 1. No acute aortic pathology identified in the chest or abdomen. No vascular abnormalities identified. 2. Bibasilar opacities, right greater than left may represent atelectasis. Component of aspiration cannot be excluded, especially at the right lung  base. 3. Coronary atherosclerosis with evidence of calcified plaque and/or stents in the LAD and RCA. 4. Right inguinal hernia containing fat. Electronically Signed   By: GAletta EdouardM.D.   On: 06/25/2018 08:42    Cardiac Studies   Echo: pending  Patient Profile     67y.o. male with a [facility] HTN, HLD, CKD III, frontal lobe dementia, GERD, bradycardia and orthostasis, gout episodes x2 (2015, 2016), total knee b/l replacements, anxiety, ADHD, 2018 shoulder dislocation with fracture who presented with chest pain a few hours duration.  His EKG showed borderline ST elevation in lead I and aVL.  Urgent cardiac catheterization was considered but the patient started vomiting blood.   Assessment & Plan    1.  GI bleed: I agree that this does not appear to be severe based on his labs.  According to GI, this could have been due to Mallory-Weiss tear.  Continue to monitor  hemoglobin.  He is currently off aspirin and Plavix.  2.  Possible unstable angina: In spite of all the hemodynamic instability, his troponin remains negative.  The patient will require ischemic work-up before hospital discharge.  I am going to obtain an echocardiogram.  3.  Vasovagal cardiac arrest: This was in the setting of nausea and vomiting and was brief.  No evidence of arrhythmia.  4.  The patient seems to be alert today and likely will be extubated.  He was intubated for airway protection mainly.  The exact etiology of the patient initial presentation is still not entirely clear.  CT scan of the chest and abdomen was unremarkable overall.     For questions or updates, please contact [place] Please consult [email or web address] for contact info under        Signed, Kathlyn Sacramento, MD  06/26/2018, 9:20 AM

## 2018-06-26 NOTE — Progress Notes (Signed)
Edwin Antigua, MD [address], [place], [place], [place], [address] [address], [place], [place], [place], [address] Phone: [phone]  Fax: [phone]   Subjective: Patient intubated and sedated.  NG tube with dark brown output.  Hemoglobin at 11.6 today.   Objective: Exam: Vital signs in last 24 hours: Vitals:   06/26/18 0500 06/26/18 0600 06/26/18 0800 06/26/18 0900  BP: 101/75 108/71 101/65 123/79  Pulse: 87 84 83 91  Resp: (!) 24 (!) 24    Temp:   98.4 F (36.9 C)   TempSrc:      SpO2: 97% 96% 96% 96%  Weight:      Height:       Weight change:   Intake/Output Summary (Last 24 hours) at 06/26/2018 0942 Last data filed at 06/26/2018 0600 Gross per 24 hour  Intake 1736.9 ml  Output 705 ml  Net 1031.9 ml    General: No acute distress, AAO x3 Abd: Soft, NT/ND, No HSM Skin: Warm, no rashes Neck: Supple, Trachea midline   Lab Results: Lab Results  Component Value Date   WBC 18.7 (H) 06/26/2018   HGB 11.6 (L) 06/26/2018   HCT 35.1 (L) 06/26/2018   MCV 71.1 (L) 06/26/2018   PLT 212 06/26/2018   Micro Results: Recent Results (from the past 240 hour(s))  MRSA PCR Screening     Status: None   Collection Time: 06/25/18  9:25 AM  Result Value Ref Range Status   MRSA by PCR NEGATIVE NEGATIVE Final    Comment:        The GeneXpert MRSA Assay (FDA approved for NASAL specimens only), is one component of a comprehensive MRSA colonization surveillance program. It is not intended to diagnose MRSA infection nor to guide or monitor treatment for MRSA infections. Performed at [facility], [address] [place], [place] [address]    Studies/Results: Ct Head Wo Contrast  Result Date: 06/25/2018 CLINICAL DATA:  Encephalopathy, cardiac arrest EXAM: CT HEAD WITHOUT CONTRAST TECHNIQUE: Contiguous axial images were obtained from the base of the skull through the vertex without intravenous contrast. COMPARISON:  02/09/2017 FINDINGS: Brain:  No evidence of acute infarction, hemorrhage, extra-axial collection, ventriculomegaly, or mass effect. Small left choroidal fissure cyst. Generalized cerebral atrophy. Periventricular white matter low attenuation likely secondary to microangiopathy. Vascular: Cerebrovascular atherosclerotic calcifications are noted. Skull: Negative for fracture or focal lesion. Sinuses/Orbits: Visualized portions of the orbits are unremarkable. Visualized portions of the paranasal sinuses are unremarkable. Visualized portions of the mastoid air cells are unremarkable. Other: None. IMPRESSION: No acute intracranial pathology. Electronically Signed   By: Kathreen Devoid   On: 06/25/2018 13:54   Dg Chest Port 1 View  Result Date: 06/26/2018 CLINICAL DATA:  Shortness of breath. EXAM: PORTABLE CHEST 1 VIEW COMPARISON:  One-view chest x-ray 06/25/2018 FINDINGS: Heart size is normal. Endotracheal tube terminates 3 cm above the carina. A left subclavian line is stable. NG tube courses off the inferior border the film. Defibrillator pads are in place. Lung volumes are improved. No focal airspace disease is present. IMPRESSION: 1. Support apparatus stable. 2. Improving lung volumes and aeration. Electronically Signed   By: San Morelle M.D.   On: 06/26/2018 07:14   Dg Chest Port 1 View  Result Date: 06/25/2018 CLINICAL DATA:  Central line placement EXAM: PORTABLE CHEST 1 VIEW COMPARISON:  06/25/2018 FINDINGS: Left subclavian central line tip SVC RA junction level. Endotracheal tube remains 3.4 cm above the carina. NG tube within the stomach. Defibrillator pads overlie  the chest. Low lung volumes persist with scattered atelectasis. No focal collapse or consolidation. No large effusion or pneumothorax. IMPRESSION: Left subclavian central line tip SVC RA junction level. Lower lung volumes with increased atelectasis Other support apparatus stable. Electronically Signed   By: Jerilynn Mages.  Shick M.D.   On: 06/25/2018 10:27   Dg Chest  Portable 1 View  Result Date: 06/25/2018 CLINICAL DATA:  Intubation. EXAM: PORTABLE CHEST 1 VIEW COMPARISON:  06/25/2018. FINDINGS: Endotracheal tube noted with tip 3 cm above the carina. NG tube noted with tip below left hemidiaphragm. Heart size normal. Mild bibasilar atelectasis/infiltrates. Tiny left pleural effusion. No pneumothorax. No acute bony abnormality. IMPRESSION: 1. Endotracheal tube noted with tip 3 cm above the carina. NG tube noted with tip below left hemidiaphragm. 2. Mild bibasilar atelectasis/infiltrates. Tiny left pleural effusion. Electronically Signed   By: Marcello Moores  Register   On: 06/25/2018 08:05   Dg Chest Port 1 View  Result Date: 06/25/2018 CLINICAL DATA:  Chest pain for several hours EXAM: PORTABLE CHEST 1 VIEW COMPARISON:  12/04/2017 FINDINGS: The heart size and mediastinal contours are within normal limits. Both lungs are clear. The visualized skeletal structures are unremarkable. IMPRESSION: No active disease. Electronically Signed   By: Inez Catalina M.D.   On: 06/25/2018 07:24   Ct Angio Chest/abd/pel For Dissection W And/or W/wo  Result Date: 06/25/2018 CLINICAL DATA:  Chest pain, shortness of breath, nausea, dizziness and diaphoresis. Now intubated EXAM: CT ANGIOGRAPHY CHEST, ABDOMEN AND PELVIS TECHNIQUE: Multidetector CT imaging through the chest, abdomen and pelvis was performed using the standard protocol during bolus administration of intravenous contrast. Multiplanar reconstructed images and MIPs were obtained and reviewed to evaluate the vascular anatomy. CONTRAST:  140m ISOVUE-370 IOPAMIDOL (ISOVUE-370) INJECTION 76% COMPARISON:  Chest x-ray today. FINDINGS: CTA CHEST FINDINGS Cardiovascular: The thoracic aorta is normal in caliber and well opacified. No evidence of aneurysmal disease or aortic dissection. No significant atherosclerosis identified of the aorta or visualized proximal great vessels. The heart size is normal. No pericardial fluid identified. Central  pulmonary arteries are of normal caliber. Calcified plaque versus stent present in the distribution of the LAD. There likely also is plaque versus stent in the distribution of the RCA. Mediastinum/Nodes: Endotracheal tube present. No enlarged mediastinal, hilar, or axillary lymph nodes. Thyroid gland, trachea, and esophagus demonstrate no significant findings. Lungs/Pleura: Posterior bibasilar opacities present, right greater than left most likely representing atelectasis. A component of possible aspiration is not excluded. There is no evidence of airspace edema, pneumothorax, nodule or pleural fluid. Musculoskeletal: No chest wall abnormality. No acute or significant osseous findings. Review of the MIP images confirms the above findings. CTA ABDOMEN AND PELVIS FINDINGS VASCULAR Aorta: The abdominal aorta is normally patent and demonstrates mild atherosclerotic plaque. No evidence of aneurysmal disease or dissection. Celiac: Normally patent.  Normal distal branch vessel anatomy. SMA: Normally patent. Renals: Bilateral single renal arteries demonstrate normal patency. IMA: Normally patent. Inflow: Bilateral iliac arteries demonstrate normal patency. Common femoral arteries and femoral bifurcations are normally patent. Review of the MIP images confirms the above findings. NON-VASCULAR Hepatobiliary: No focal liver abnormality is seen. No gallstones, gallbladder wall thickening, or biliary dilatation. Pancreas: Unremarkable. No pancreatic ductal dilatation or surrounding inflammatory changes. Spleen: Normal in size without focal abnormality. Adrenals/Urinary Tract: Adrenal glands are unremarkable. Kidneys are normal, without renal calculi, focal lesion, or hydronephrosis. Bladder is unremarkable. Stomach/Bowel: Gastric decompression tube extends into the stomach. Bowel shows no evidence of obstruction, ileus or perforation. No inflammation identified. There is  moderate stool in the rectum. Lymphatic: No enlarged lymph  nodes identified. Reproductive: Prostate is unremarkable. Other: A right inguinal hernia contains fat. No ascites or abscess identified. Musculoskeletal: The lumbar spine demonstrates degenerative disc disease. No bony fractures or lesions identified. Review of the MIP images confirms the above findings. IMPRESSION: 1. No acute aortic pathology identified in the chest or abdomen. No vascular abnormalities identified. 2. Bibasilar opacities, right greater than left may represent atelectasis. Component of aspiration cannot be excluded, especially at the right lung base. 3. Coronary atherosclerosis with evidence of calcified plaque and/or stents in the LAD and RCA. 4. Right inguinal hernia containing fat. Electronically Signed   By: Aletta Edouard M.D.   On: 06/25/2018 08:42   Medications:  Scheduled Meds: . chlorhexidine gluconate (MEDLINE KIT)  15 mL Mouth Rinse BID  . ipratropium-albuterol  3 mL Nebulization Q6H  . mouth rinse  15 mL Mouth Rinse 10 times per day  . pantoprazole  40 mg Intravenous Once  . [START ON 06/28/2018] pantoprazole  40 mg Intravenous Q12H   Continuous Infusions: . sodium chloride 50 mL/hr at 06/26/18 0600  . fentaNYL infusion INTRAVENOUS 125 mcg/hr (06/26/18 0600)  . pantoprozole (PROTONIX) infusion 8 mg/hr (06/26/18 0600)  . piperacillin-tazobactam (ZOSYN)  IV    . propofol (DIPRIVAN) infusion 25 mcg/kg/min (06/26/18 0600)   PRN Meds:.acetaminophen **OR** acetaminophen, nitroGLYCERIN   Assessment: 67 year old male admitted with STEMI, with dark output seen from NG tube, with stable hemoglobin  Plan: Patient was seen by Dr. Allen Norris yesterday and given his acute STEMI, and is considered high risk for endoscopic procedures at this time  Dark output via NG tube may have been due to gastritis versus esophagitis versus Mallory-Weiss tear, and does not appear to be hemodynamically significant GI bleed, to put patient through risks of endoscopy in the setting of a STEMI.   Risk of procedure would outweigh benefits at this time.  PPI IV twice daily  Continue serial CBCs and transfuse PRN Avoid NSAIDs Maintain 2 large-bore IV lines   would recommend CTA or RBC scan to localize source of bleeding patient has drop in hemoglobin, or signs of acute GI bleeding with NG output turning more red in color, melanotic stools, hematochezia, or any other signs of active GI bleeding.     LOS: 1 day   Edwin Antigua, MD 06/26/2018, 9:42 AM

## 2018-06-26 NOTE — Progress Notes (Signed)
Pt extubated without complications, no stridor noted, placed on 4lpm [place], sats 95%, respiratory rate 18/min, will continue to monitor.

## 2018-06-26 NOTE — Progress Notes (Signed)
[facility] [place] NAME: Edwin Woodward    MR#:  [id number]  DATE OF BIRTH:  14-Jan-1951  SUBJECTIVE:  CHIEF COMPLAINT:   Chief Complaint  Patient presents with  . Chest Pain   The patient has no complaints, he was extubated this morning.  He has tachycardia. REVIEW OF SYSTEMS:  Review of Systems  Constitutional: Negative for chills, fever and malaise/fatigue.  HENT: Negative for sore throat.   Eyes: Negative for blurred vision and double vision.  Respiratory: Negative for cough, hemoptysis, shortness of breath, wheezing and stridor.   Cardiovascular: Negative for chest pain, palpitations, orthopnea and leg swelling.  Gastrointestinal: Negative for abdominal pain, blood in stool, diarrhea, melena, nausea and vomiting.  Genitourinary: Negative for dysuria, flank pain and hematuria.  Musculoskeletal: Negative for back pain and joint pain.  Skin: Negative for rash.  Neurological: Negative for dizziness, sensory change, focal weakness, seizures, loss of consciousness, weakness and headaches.  Endo/Heme/Allergies: Negative for polydipsia.  Psychiatric/Behavioral: Negative for depression. The patient is not nervous/anxious.     DRUG ALLERGIES:   Allergies  Allergen Reactions  . Biaxin [Clarithromycin]   . Clarithromycin   . Codeine     Sever vomiting   . Penicillins   . Promethazine   . Iodinated Diagnostic Agents Rash  . Metrizamide Rash   VITALS:  Blood pressure 120/78, pulse (!) 112, temperature 98.8 F (37.1 C), resp. rate (!) 24, height 5\' 3"  (1.6 m), weight 79.8 kg, SpO2 96 %. PHYSICAL EXAMINATION:  Physical Exam  Constitutional: He is oriented to person, place, and time. No distress.  HENT:  Head: Normocephalic.  Mouth/Throat: Oropharynx is clear and moist.  Eyes: Pupils are equal, round, and reactive to light. Conjunctivae and EOM are normal. No scleral icterus.  Neck: Normal range of motion. Neck supple. No JVD  present. No tracheal deviation present.  Cardiovascular: Normal rate, regular rhythm and normal heart sounds. Exam reveals no gallop.  No murmur heard. Pulmonary/Chest: Effort normal and breath sounds normal. No respiratory distress. He has no wheezes. He has no rales.  Abdominal: Soft. Bowel sounds are normal. He exhibits no distension. There is no tenderness. There is no rebound.  Musculoskeletal: Normal range of motion. He exhibits no edema or tenderness.  Neurological: He is alert and oriented to person, place, and time. No cranial nerve deficit.  Skin: No rash noted. No erythema.  Psychiatric: He has a normal mood and affect.   LABORATORY PANEL:  Male CBC Recent Labs  Lab 06/26/18 0406  WBC 18.7*  HGB 11.6*  HCT 35.1*  PLT 212   ------------------------------------------------------------------------------------------------------------------ Chemistries  Recent Labs  Lab 06/26/18 0406  NA 142  K 4.6  CL 111  CO2 24  GLUCOSE 126*  BUN 43*  CREATININE 1.39*  CALCIUM 8.4*  MG 1.9  AST 23  ALT 20  ALKPHOS 53  BILITOT 0.8   RADIOLOGY:  Dg Chest Port 1 View  Result Date: 06/26/2018 CLINICAL DATA:  Shortness of breath. EXAM: PORTABLE CHEST 1 VIEW COMPARISON:  One-view chest x-ray 06/25/2018 FINDINGS: Heart size is normal. Endotracheal tube terminates 3 cm above the carina. A left subclavian line is stable. NG tube courses off the inferior border the film. Defibrillator pads are in place. Lung volumes are improved. No focal airspace disease is present. IMPRESSION: 1. Support apparatus stable. 2. Improving lung volumes and aeration. Electronically Signed   By: San Morelle M.D.   On: 06/26/2018 07:14   ASSESSMENT  AND PLAN:   Edwin Woodward  is a 67 y.o. male with a known history of coronary artery disease status post stent in the past, hypertension, hyperlipidemia, dementia/cognitive decline came to the emergency room by EMS after he started having some chest  pain around 9 PM last night. Patient reported some shortness of breath nausea and dizziness.  1. Acute respiratory failure likely due to an episode of vomiting with some chemical pneumonitis The patient was intubated for airway protection and put on the ventilator, extubated this morning. On O2 [place].  Continue Zosyn IV, follow-up CBC and cultures.  Still leukocytosis.   2.  Cardiac arrest, possible due to vasovagal response secondary vomiting. No evidence of STEMI or non-STEMI per Dr. Fletcher Anon.  3 G.I. Bleed received two units of positive blood. Hemoglobin is stable.  No active bleeding. Continue IV Protonix drip, follow-up with GI for further recommendation.  2.   Hypotension. Improved with IV fluids, IV levophed and transfusion.  3. Chest pain with history of coronary artery disease status post bare metal stent in the past -patient was on aspirin and Plavix, which were hold due to GI bleeding. No MI, stress test on Monday per Dr. Fletcher Anon.  4.   Acute renal failure, possible due to hypotension.  IV fluid support and follow-up BMP.  5.   Lactic acidosis, due to above, follow-up level.  6. History of dementia  I discussed with Dr. Fletcher Anon. All the records are reviewed and case discussed with Care Management/Social Worker. Management plans discussed with the patient, family and they are in agreement.  CODE STATUS: Full Code  TOTAL TIME TAKING CARE OF THIS PATIENT: 36 minutes.   More than 50% of the time was spent in counseling/coordination of care: YES  POSSIBLE D/C IN 2-3 DAYS, DEPENDING ON CLINICAL CONDITION.   [name] M.D on 06/26/2018 at 5:01 PM  Between 7am to 6pm - Pager - [phone]  After 6pm go to [email or web address] - [facility]

## 2018-06-26 NOTE — Progress Notes (Signed)
*  PRELIMINARY RESULTS* Echocardiogram 2D Echocardiogram has been performed.  Edwin Woodward 06/26/2018, 3:20 PM

## 2018-06-27 ENCOUNTER — Other Ambulatory Visit: Payer: Self-pay

## 2018-06-27 LAB — CBC
HEMATOCRIT: 28.5 % — AB (ref 39.0–52.0)
Hemoglobin: 9.1 g/dL — ABNORMAL LOW (ref 13.0–17.0)
MCH: 23 pg — AB (ref 26.0–34.0)
MCHC: 31.9 g/dL (ref 30.0–36.0)
MCV: 72.2 fL — ABNORMAL LOW (ref 80.0–100.0)
Platelets: 163 10*3/uL (ref 150–400)
RBC: 3.95 MIL/uL — ABNORMAL LOW (ref 4.22–5.81)
RDW: 21.1 % — AB (ref 11.5–15.5)
WBC: 14.2 10*3/uL — ABNORMAL HIGH (ref 4.0–10.5)
nRBC: 0.1 % (ref 0.0–0.2)

## 2018-06-27 LAB — MAGNESIUM: MAGNESIUM: 1.9 mg/dL (ref 1.7–2.4)

## 2018-06-27 LAB — GLUCOSE, CAPILLARY: Glucose-Capillary: 116 mg/dL — ABNORMAL HIGH (ref 70–99)

## 2018-06-27 MED ORDER — DONEPEZIL HCL 5 MG PO TABS
5.0000 mg | ORAL_TABLET | Freq: Two times a day (BID) | ORAL | Status: DC
  Administered 2018-06-27 – 2018-07-01 (×9): 5 mg via ORAL
  Filled 2018-06-27 (×10): qty 1

## 2018-06-27 MED ORDER — PANTOPRAZOLE SODIUM 40 MG PO TBEC
40.0000 mg | DELAYED_RELEASE_TABLET | Freq: Two times a day (BID) | ORAL | Status: DC
  Administered 2018-06-27 – 2018-06-29 (×4): 40 mg via ORAL
  Filled 2018-06-27 (×4): qty 1

## 2018-06-27 MED ORDER — MAGNESIUM OXIDE 400 (241.3 MG) MG PO TABS
400.0000 mg | ORAL_TABLET | Freq: Two times a day (BID) | ORAL | Status: DC
  Administered 2018-06-27 – 2018-07-01 (×9): 400 mg via ORAL
  Filled 2018-06-27 (×9): qty 1

## 2018-06-27 MED ORDER — IPRATROPIUM-ALBUTEROL 0.5-2.5 (3) MG/3ML IN SOLN
3.0000 mL | Freq: Three times a day (TID) | RESPIRATORY_TRACT | Status: DC
  Administered 2018-06-28 – 2018-07-01 (×8): 3 mL via RESPIRATORY_TRACT
  Filled 2018-06-27 (×10): qty 3

## 2018-06-27 MED ORDER — ALFUZOSIN HCL ER 10 MG PO TB24
10.0000 mg | ORAL_TABLET | Freq: Every day | ORAL | Status: DC
  Administered 2018-06-27 – 2018-07-01 (×5): 10 mg via ORAL
  Filled 2018-06-27 (×5): qty 1

## 2018-06-27 MED ORDER — RISPERIDONE 0.25 MG PO TABS
0.2500 mg | ORAL_TABLET | Freq: Two times a day (BID) | ORAL | Status: DC
  Administered 2018-06-27 – 2018-07-01 (×9): 0.25 mg via ORAL
  Filled 2018-06-27 (×10): qty 1

## 2018-06-27 MED ORDER — PHENOL 1.4 % MT LIQD
1.0000 | OROMUCOSAL | Status: DC | PRN
  Administered 2018-06-27 – 2018-06-28 (×2): 1 via OROMUCOSAL
  Filled 2018-06-27: qty 177

## 2018-06-27 MED ORDER — ATORVASTATIN CALCIUM 20 MG PO TABS
40.0000 mg | ORAL_TABLET | Freq: Every day | ORAL | Status: DC
  Administered 2018-06-27 – 2018-06-30 (×4): 40 mg via ORAL
  Filled 2018-06-27 (×4): qty 2

## 2018-06-27 MED ORDER — BUPROPION HCL ER (SR) 150 MG PO TB12
150.0000 mg | ORAL_TABLET | Freq: Two times a day (BID) | ORAL | Status: DC
  Administered 2018-06-27 – 2018-07-01 (×9): 150 mg via ORAL
  Filled 2018-06-27 (×10): qty 1

## 2018-06-27 MED ORDER — FINASTERIDE 5 MG PO TABS
5.0000 mg | ORAL_TABLET | Freq: Every day | ORAL | Status: DC
  Administered 2018-06-27 – 2018-07-01 (×5): 5 mg via ORAL
  Filled 2018-06-27 (×5): qty 1

## 2018-06-27 MED ORDER — CALCIUM CARBONATE ANTACID 500 MG PO CHEW
500.0000 mg | CHEWABLE_TABLET | Freq: Two times a day (BID) | ORAL | Status: DC
  Administered 2018-06-27 – 2018-07-01 (×6): 500 mg via ORAL
  Filled 2018-06-27 (×6): qty 3

## 2018-06-27 MED ORDER — CITALOPRAM HYDROBROMIDE 20 MG PO TABS
30.0000 mg | ORAL_TABLET | Freq: Every day | ORAL | Status: DC
  Administered 2018-06-27 – 2018-07-01 (×5): 30 mg via ORAL
  Filled 2018-06-27 (×5): qty 2

## 2018-06-27 NOTE — Progress Notes (Signed)
Progress Note  Patient Name: Edwin Woodward Date of Encounter: 06/27/2018  Primary Cardiologist: Dr. Rockey Situ  Subjective   The patient was extubated yesterday.  He feels well and denies any chest pain.  He reports that he was following a special tomato-based diet over the last 2 months prior to presentation.  He also eats a lot of spicy food.  Inpatient Medications    Scheduled Meds: . atorvastatin  40 mg Oral q1800  . Influenza vac split quadrivalent PF  0.5 mL Intramuscular Tomorrow-1000  . ipratropium-albuterol  3 mL Nebulization Q6H  . mouth rinse  15 mL Mouth Rinse BID  . pantoprazole  40 mg Intravenous Once  . [START ON 06/28/2018] pantoprazole  40 mg Intravenous Q12H  . pneumococcal 23 valent vaccine  0.5 mL Intramuscular Tomorrow-1000   Continuous Infusions: . sodium chloride 50 mL/hr at 06/27/18 0620  . pantoprozole (PROTONIX) infusion Stopped (06/26/18 1139)  . piperacillin-tazobactam (ZOSYN)  IV 3.375 g (06/27/18 0824)   PRN Meds: acetaminophen **OR** acetaminophen, nitroGLYCERIN   Vital Signs    Vitals:   06/27/18 0400 06/27/18 0500 06/27/18 0600 06/27/18 0800  BP: 102/69 117/72 120/78 115/70  Pulse: 85 84 83 89  Resp: 16 14 13 18   Temp: 98.7 F (37.1 C)   99.3 F (37.4 C)  TempSrc: Oral   Oral  SpO2: 92% 95% 91% 91%  Weight:      Height:        Intake/Output Summary (Last 24 hours) at 06/27/2018 0847 Last data filed at 06/27/2018 1829 Gross per 24 hour  Intake 927.68 ml  Output 875 ml  Net 52.68 ml   Filed Weights   06/25/18 0650  Weight: 79.8 kg    Telemetry    Normal sinus rhythm- Personally Reviewed  ECG    Not done today.- Personally Reviewed  Physical Exam   GEN: No acute distress.   Neck: No JVD Cardiac: RRR, no murmurs, rubs, or gallops.  Respiratory: Clear to auscultation bilaterally. GI: Soft, nontender, non-distended  MS: No edema; No deformity. Neuro:  Nonfocal  Psych: Normal affect   Labs     Chemistry Recent Labs  Lab 06/25/18 0654 06/25/18 1015 06/26/18 0406  NA 138  --  142  K 4.6  --  4.6  CL 105  --  111  CO2 22  --  24  GLUCOSE 141*  --  126*  BUN 57*  --  43*  CREATININE 1.20  --  1.39*  CALCIUM 9.1  --  8.4*  PROT 6.7 6.5 5.9*  ALBUMIN 3.8 3.5 3.5  AST 25 24 23   ALT 25 23 20   ALKPHOS 63 61 53  BILITOT 1.0 0.9 0.8  GFRNONAA >60  --  51*  GFRAA >60  --  59*  ANIONGAP 11  --  7     Hematology Recent Labs  Lab 06/25/18 2254 06/26/18 0037 06/26/18 0406 06/27/18 0432  WBC 16.9*  --  18.7* 14.2*  RBC 5.14  --  4.94 3.95*  HGB 12.0* 11.8* 11.6* 9.1*  HCT 36.3*  --  35.1* 28.5*  MCV 70.6*  --  71.1* 72.2*  MCH 23.3*  --  23.5* 23.0*  MCHC 33.1  --  33.0 31.9  RDW 20.1*  --  20.3* 21.1*  PLT 227  --  212 163    Cardiac Enzymes Recent Labs  Lab 06/25/18 0654 06/25/18 1015 06/26/18 0406  TROPONINI <0.03 0.03* <0.03   No results for input(s): TROPIPOC in  the last 168 hours.   BNP Recent Labs  Lab 06/25/18 0654  BNP 17.0     DDimer No results for input(s): DDIMER in the last 168 hours.   Radiology    Ct Head Wo Contrast  Result Date: 06/25/2018 CLINICAL DATA:  Encephalopathy, cardiac arrest EXAM: CT HEAD WITHOUT CONTRAST TECHNIQUE: Contiguous axial images were obtained from the base of the skull through the vertex without intravenous contrast. COMPARISON:  02/09/2017 FINDINGS: Brain: No evidence of acute infarction, hemorrhage, extra-axial collection, ventriculomegaly, or mass effect. Small left choroidal fissure cyst. Generalized cerebral atrophy. Periventricular white matter low attenuation likely secondary to microangiopathy. Vascular: Cerebrovascular atherosclerotic calcifications are noted. Skull: Negative for fracture or focal lesion. Sinuses/Orbits: Visualized portions of the orbits are unremarkable. Visualized portions of the paranasal sinuses are unremarkable. Visualized portions of the mastoid air cells are unremarkable. Other: None.  IMPRESSION: No acute intracranial pathology. Electronically Signed   By: Kathreen Devoid   On: 06/25/2018 13:54   Dg Chest Port 1 View  Result Date: 06/26/2018 CLINICAL DATA:  Shortness of breath. EXAM: PORTABLE CHEST 1 VIEW COMPARISON:  One-view chest x-ray 06/25/2018 FINDINGS: Heart size is normal. Endotracheal tube terminates 3 cm above the carina. A left subclavian line is stable. NG tube courses off the inferior border the film. Defibrillator pads are in place. Lung volumes are improved. No focal airspace disease is present. IMPRESSION: 1. Support apparatus stable. 2. Improving lung volumes and aeration. Electronically Signed   By: San Morelle M.D.   On: 06/26/2018 07:14   Dg Chest Port 1 View  Result Date: 06/25/2018 CLINICAL DATA:  Central line placement EXAM: PORTABLE CHEST 1 VIEW COMPARISON:  06/25/2018 FINDINGS: Left subclavian central line tip SVC RA junction level. Endotracheal tube remains 3.4 cm above the carina. NG tube within the stomach. Defibrillator pads overlie the chest. Low lung volumes persist with scattered atelectasis. No focal collapse or consolidation. No large effusion or pneumothorax. IMPRESSION: Left subclavian central line tip SVC RA junction level. Lower lung volumes with increased atelectasis Other support apparatus stable. Electronically Signed   By: Jerilynn Mages.  Shick M.D.   On: 06/25/2018 10:27    Cardiac Studies   Echocardiogram was personally reviewed by me and done yesterday.  It showed normal LV systolic function and wall motion.  Patient Profile     67 y.o. male with a hx of CAD s/p BMS to [facility], LAD (12/2007), HTN, HLD, CKD III, frontal lobe dementia, GERD, bradycardia and orthostasis, gout episodes x2 (2015, 2016), total knee b/l replacements, anxiety, ADHD, 2018 shoulder dislocation with fracture who presented with chest pain a few hours duration.  His EKG showed borderline ST elevation in lead I and aVL.  Urgent cardiac catheterization was considered but the  patient started vomiting blood.   Assessment & Plan    1.  GI bleed: His hemoglobin did drop to 9.1 from 11.6.  Currently with no active bleeding. According to GI, this could have been due to Mallory-Weiss tear.  Continue to monitor hemoglobin.  He is currently off aspirin and Plavix.  2.  Possible unstable angina: In spite of all the hemodynamic instability, his troponin remains negative.  It is possible that some of his chest pain was GI in nature.  Echocardiogram showed normal ejection fraction and wall motion.  I am going to obtain a [employer] tomorrow for risk stratification given his known history of coronary artery disease and prior stenting.  3.  Vasovagal cardiac arrest: This was in the setting  of nausea and vomiting and was brief.  No evidence of arrhythmia.  The patient can be transferred to telemetry from our standpoint.  For questions or updates, please contact [place] Please consult [email or web address] for contact info under        Signed, Kathlyn Sacramento, MD  06/27/2018, 8:47 AM

## 2018-06-27 NOTE — Progress Notes (Signed)
Pt being transferred to room 258. Report called to [place], Therapist, sports. Pt and belongings transferred to room 258 without incident.

## 2018-06-27 NOTE — Progress Notes (Signed)
[facility] at [place] NAME: Edwin Woodward    MR#:  [id number]  DATE OF BIRTH:  [birth year]  SUBJECTIVE:  CHIEF COMPLAINT:   Chief Complaint  Patient presents with  . Chest Pain   The patient has no complaints, on O2 [place] 2 L. REVIEW OF SYSTEMS:  Review of Systems  Constitutional: Negative for chills, fever and malaise/fatigue.  HENT: Negative for sore throat.   Eyes: Negative for blurred vision and double vision.  Respiratory: Negative for cough, hemoptysis, shortness of breath, wheezing and stridor.   Cardiovascular: Negative for chest pain, palpitations, orthopnea and leg swelling.  Gastrointestinal: Negative for abdominal pain, blood in stool, diarrhea, melena, nausea and vomiting.  Genitourinary: Negative for dysuria, flank pain and hematuria.  Musculoskeletal: Negative for back pain and joint pain.  Skin: Negative for rash.  Neurological: Negative for dizziness, sensory change, focal weakness, seizures, loss of consciousness, weakness and headaches.  Endo/Heme/Allergies: Negative for polydipsia.  Psychiatric/Behavioral: Negative for depression. The patient is not nervous/anxious.     DRUG ALLERGIES:   Allergies  Allergen Reactions  . Biaxin [Clarithromycin]   . Clarithromycin   . Codeine     Sever vomiting   . Penicillins   . Promethazine   . Iodinated Diagnostic Agents Rash  . Metrizamide Rash   VITALS:  Blood pressure 107/63, pulse 87, temperature 98.8 F (37.1 C), temperature source Oral, resp. rate 16, height 5\' 3"  (1.6 m), weight 79.8 kg, SpO2 90 %. PHYSICAL EXAMINATION:  Physical Exam  Constitutional: He is oriented to person, place, and time. No distress.  HENT:  Head: Normocephalic.  Mouth/Throat: Oropharynx is clear and moist.  Eyes: Pupils are equal, round, and reactive to light. Conjunctivae and EOM are normal. No scleral icterus.  Neck: Normal range of motion. Neck supple. No JVD present. No tracheal  deviation present.  Cardiovascular: Normal rate, regular rhythm and normal heart sounds. Exam reveals no gallop.  No murmur heard. Pulmonary/Chest: Effort normal and breath sounds normal. No respiratory distress. He has no wheezes. He has no rales.  Abdominal: Soft. Bowel sounds are normal. He exhibits no distension. There is no tenderness. There is no rebound.  Musculoskeletal: Normal range of motion. He exhibits no edema or tenderness.  Neurological: He is alert and oriented to person, place, and time. No cranial nerve deficit.  Skin: No rash noted. No erythema.  Psychiatric: He has a normal mood and affect.   LABORATORY PANEL:  Male CBC Recent Labs  Lab 06/27/18 0432  WBC 14.2*  HGB 9.1*  HCT 28.5*  PLT 163   ------------------------------------------------------------------------------------------------------------------ Chemistries  Recent Labs  Lab 06/26/18 0406 06/27/18 1255  NA 142  --   K 4.6  --   CL 111  --   CO2 24  --   GLUCOSE 126*  --   BUN 43*  --   CREATININE 1.39*  --   CALCIUM 8.4*  --   MG 1.9 1.9  AST 23  --   ALT 20  --   ALKPHOS 53  --   BILITOT 0.8  --    RADIOLOGY:  No results found. ASSESSMENT AND PLAN:   Edwin Woodward  is a 67 y.o. male with a known history of coronary artery disease status post stent in the past, hypertension, hyperlipidemia, dementia/cognitive decline came to the emergency room by EMS after he started having some chest pain around 9 PM last night. Patient reported some shortness of breath nausea and  dizziness.  1. Acute respiratory failure likely due to an episode of vomiting with some chemical pneumonitis The patient was intubated for airway protection and put on the ventilator, extubated this morning. On O2 [place].  Continue Zosyn IV, follow-up CBC and cultures.  Better leukocytosis.   2.  Cardiac arrest, possible due to vasovagal response secondary vomiting. No evidence of STEMI or non-STEMI per Dr. Fletcher Anon.  3 G.I.  Bleed received two units of positive blood. No active bleeding. Hemoglobin decreased to 9.1, possible due to IV fluid dilution.  Follow-up hemoglobin and GI. Discontinue IV Protonix drip, changed to p.o. Protonix twice daily..  4.   Hypotension. Improved with IV fluids, IV levophed and transfusion.  5. Chest pain with history of coronary artery disease status post bare metal stent in the past -patient was on aspirin and Plavix, which were hold due to GI bleeding. No MI, stress test on Monday per Dr. Fletcher Anon.  6.   Acute renal failure, possible due to hypotension.  IV fluid support and follow-up BMP.  5.   Lactic acidosis, due to above, follow-up level.  6. History of dementia  I discussed with Dr. Fletcher Anon. All the records are reviewed and case discussed with Care Management/Social Worker. Management plans discussed with the patient, family and they are in agreement.  CODE STATUS: Full Code  TOTAL TIME TAKING CARE OF THIS PATIENT: 36 minutes.   More than 50% of the time was spent in counseling/coordination of care: YES  POSSIBLE D/C IN 2 DAYS, DEPENDING ON CLINICAL CONDITION.   Edwin Woodward M.D on 06/27/2018 at 1:48 PM  Between 7am to 6pm - Pager - [phone]  After 6pm go to [email or web address] - [facility]

## 2018-06-28 ENCOUNTER — Inpatient Hospital Stay (HOSPITAL_BASED_OUTPATIENT_CLINIC_OR_DEPARTMENT_OTHER): Payer: Medicare HMO

## 2018-06-28 DIAGNOSIS — I25118 Atherosclerotic heart disease of native coronary artery with other forms of angina pectoris: Secondary | ICD-10-CM | POA: Diagnosis not present

## 2018-06-28 LAB — BPAM RBC
BLOOD PRODUCT EXPIRATION DATE: 201911082359
BLOOD PRODUCT EXPIRATION DATE: 201911082359
BLOOD PRODUCT EXPIRATION DATE: 201911112359
Blood Product Expiration Date: 201911082359
Blood Product Expiration Date: 201911082359
Blood Product Expiration Date: 201911112359
ISSUE DATE / TIME: 201910180733
ISSUE DATE / TIME: 201910180733
Unit Type and Rh: 5100
Unit Type and Rh: 5100
Unit Type and Rh: 5100
Unit Type and Rh: 5100
Unit Type and Rh: 5100
Unit Type and Rh: 5100

## 2018-06-28 LAB — TYPE AND SCREEN
ABO/RH(D): B POS
ANTIBODY SCREEN: NEGATIVE
UNIT DIVISION: 0
UNIT DIVISION: 0
UNIT DIVISION: 0
Unit division: 0
Unit division: 0
Unit division: 0

## 2018-06-28 LAB — CULTURE, RESPIRATORY W GRAM STAIN: Culture: NORMAL

## 2018-06-28 LAB — IRON AND TIBC
IRON: 20 ug/dL — AB (ref 45–182)
SATURATION RATIOS: 10 % — AB (ref 17.9–39.5)
TIBC: 209 ug/dL — AB (ref 250–450)
UIBC: 189 ug/dL

## 2018-06-28 LAB — NM MYOCAR MULTI W/SPECT W/WALL MOTION / EF
CHL CUP RESTING HR STRESS: 65 {beats}/min
CSEPHR: 63 %
LV dias vol: 76 mL (ref 62–150)
LV sys vol: 25 mL
Peak HR: 96 {beats}/min
SDS: 6
SRS: 14
SSS: 10
TID: 1.07

## 2018-06-28 LAB — CBC
HEMATOCRIT: 25.8 % — AB (ref 39.0–52.0)
HEMOGLOBIN: 8.2 g/dL — AB (ref 13.0–17.0)
MCH: 23.4 pg — ABNORMAL LOW (ref 26.0–34.0)
MCHC: 31.8 g/dL (ref 30.0–36.0)
MCV: 73.5 fL — AB (ref 80.0–100.0)
NRBC: 0.2 % (ref 0.0–0.2)
Platelets: 150 10*3/uL (ref 150–400)
RBC: 3.51 MIL/uL — AB (ref 4.22–5.81)
RDW: 21.2 % — AB (ref 11.5–15.5)
WBC: 8.9 10*3/uL (ref 4.0–10.5)

## 2018-06-28 LAB — BASIC METABOLIC PANEL
Anion gap: 6 (ref 5–15)
BUN: 15 mg/dL (ref 8–23)
CO2: 24 mmol/L (ref 22–32)
CREATININE: 1.03 mg/dL (ref 0.61–1.24)
Calcium: 7.7 mg/dL — ABNORMAL LOW (ref 8.9–10.3)
Chloride: 109 mmol/L (ref 98–111)
GFR calc non Af Amer: >60 mL/min (ref >60)
Glucose, Bld: 166 mg/dL — ABNORMAL HIGH (ref 70–99)
POTASSIUM: 3.2 mmol/L — AB (ref 3.5–5.1)
Sodium: 139 mmol/L (ref 135–145)

## 2018-06-28 LAB — LACTIC ACID, PLASMA: Lactic Acid, Venous: 4 mmol/L (ref 0.5–1.9)

## 2018-06-28 LAB — FOLATE: FOLATE: 22.7 ng/mL (ref >5.9)

## 2018-06-28 LAB — ECHOCARDIOGRAM COMPLETE
HEIGHTINCHES: 63 in
Weight: 2816 oz

## 2018-06-28 LAB — FERRITIN: FERRITIN: 88 ng/mL (ref 24–336)

## 2018-06-28 MED ORDER — SODIUM CHLORIDE 0.9 % IV SOLN
INTRAVENOUS | Status: DC | PRN
  Administered 2018-06-28 (×2): 500 mL via INTRAVENOUS

## 2018-06-28 MED ORDER — FLUDEOXYGLUCOSE F - 18 (FDG) INJECTION: 30.5700 | Freq: Once | INTRAVENOUS | Status: DC | PRN

## 2018-06-28 MED ORDER — SODIUM CHLORIDE 0.9% FLUSH
3.0000 mL | Freq: Two times a day (BID) | INTRAVENOUS | Status: DC
  Administered 2018-06-28 – 2018-06-30 (×5): 3 mL via INTRAVENOUS

## 2018-06-28 MED ORDER — REGADENOSON 0.4 MG/5ML IV SOLN
0.4000 mg | Freq: Once | INTRAVENOUS | Status: AC
  Administered 2018-06-28: 0.4 mg via INTRAVENOUS

## 2018-06-28 MED ORDER — SODIUM CHLORIDE 0.9 % IV SOLN
INTRAVENOUS | Status: DC
  Administered 2018-06-28 (×2): via INTRAVENOUS

## 2018-06-28 MED ORDER — TECHNETIUM TC 99M TETROFOSMIN IV KIT
30.5700 | PACK | Freq: Once | INTRAVENOUS | Status: AC | PRN
  Administered 2018-06-28: 30.57 via INTRAVENOUS

## 2018-06-28 MED ORDER — TECHNETIUM TC 99M TETROFOSMIN IV KIT
10.0000 | PACK | Freq: Once | INTRAVENOUS | Status: AC | PRN
  Administered 2018-06-28: 10.38 via INTRAVENOUS

## 2018-06-28 MED ORDER — AMOXICILLIN-POT CLAVULANATE 875-125 MG PO TABS
1.0000 | ORAL_TABLET | Freq: Two times a day (BID) | ORAL | Status: AC
  Administered 2018-06-28 – 2018-06-30 (×5): 1 via ORAL
  Filled 2018-06-28 (×5): qty 1

## 2018-06-28 MED ORDER — SODIUM CHLORIDE 0.9% FLUSH
3.0000 mL | Freq: Two times a day (BID) | INTRAVENOUS | Status: DC
  Administered 2018-06-28 – 2018-06-30 (×4): 3 mL via INTRAVENOUS

## 2018-06-28 NOTE — Progress Notes (Signed)
Telemetry order has expired.  I spoke to Dr. Bridgett Larsson and he doesn't wish to continue it.

## 2018-06-28 NOTE — Progress Notes (Addendum)
Edwin Woodward , MD [address], [place], [place], [place], [address] [address], [place], [place], [place], [address] Phone: [phone]  Fax: [phone]   Edwin Woodward is being followed for GI bleed   Subjective: 1 dark colored stool, was brown yesterday    Objective: Vital signs in last 24 hours: Vitals:   06/27/18 2030 06/28/18 0524 06/28/18 1156 06/28/18 1353  BP:  (!) 95/58 (!) 97/56   Pulse:  67 71   Resp:  18 16   Temp:  98.8 F (37.1 C) 99 F (37.2 C)   TempSrc:  Oral Oral   SpO2: 95% 91% 95% 97%  Weight:      Height:       Weight change:   Intake/Output Summary (Last 24 hours) at 06/28/2018 1616 Last data filed at 06/28/2018 1519 Gross per 24 hour  Intake 1639.93 ml  Output 675 ml  Net 964.93 ml     Exam: Heart:: Regular rate and rhythm, S1S2 present or without murmur or extra heart sounds Lungs: normal, clear to auscultation and clear to auscultation and percussion Abdomen: soft, nontender, normal bowel sounds   Lab Results: @LABTEST2 @ Micro Results: Recent Results (from the past 240 hour(s))  MRSA PCR Screening     Status: None   Collection Time: 06/25/18  9:25 AM  Result Value Ref Range Status   MRSA by PCR NEGATIVE NEGATIVE Final    Comment:        The GeneXpert MRSA Assay (FDA approved for NASAL specimens only), is one component of a comprehensive MRSA colonization surveillance program. It is not intended to diagnose MRSA infection nor to guide or monitor treatment for MRSA infections. Performed at [facility], [address] [place], [place] [address]   Culture, respiratory (non-expectorated)     Status: None   Collection Time: 06/26/18  9:21 AM  Result Value Ref Range Status   Specimen Description   Final    TRACHEAL ASPIRATE Performed at [facility], [address], [place], [place] [address]    Special Requests   Final    NONE Performed at [facility], [address]  [place], [place] [address]    Gram Stain   Final    RARE WBC PRESENT, PREDOMINANTLY PMN FEW GRAM POSITIVE COCCI IN PAIRS IN CLUSTERS RARE GRAM POSITIVE RODS    Culture   Final    MODERATE Consistent with normal respiratory flora. Performed at [facility], [place] [address], [place], [place] [address]    Report Status 06/28/2018 FINAL  Final   Studies/Results: Nm Myocar Multi W/spect W/wall Motion / Ef  Result Date: 06/28/2018  There was no ST segment deviation noted during stress.  No T wave inversion was noted during stress.  The study is normal.  This is a low risk study.  The left ventricular ejection fraction is normal (55-65%).    Medications: I have reviewed the patient's current medications. Scheduled Meds: . alfuzosin  10 mg Oral Daily  . atorvastatin  40 mg Oral q1800  . buPROPion  150 mg Oral BID  . calcium carbonate  500 mg of elemental calcium Oral BID WC  . citalopram  30 mg Oral Daily  . donepezil  5 mg Oral BID  . finasteride  5 mg Oral Daily  . ipratropium-albuterol  3 mL Nebulization TID  . magnesium oxide  400 mg Oral BID  . mouth rinse  15 mL Mouth Rinse BID  .  pantoprazole  40 mg Oral BID AC  . risperiDONE  0.25 mg Oral BID  . sodium chloride flush  3 mL Intravenous Q12H  . sodium chloride flush  3 mL Intravenous Q12H   Continuous Infusions: . sodium chloride 500 mL (06/28/18 1530)   PRN Meds:.sodium chloride, acetaminophen **OR** acetaminophen, fludeoxyglucose F - 18, nitroGLYCERIN, phenol   Assessment: Active Problems:   GI bleed  Edwin Woodward 67 y.o. male with concerns for angina , EKKG changes- per ardiology plan for stress test. Admission complicated with hematemesis. S/p cardiac arrest . Was treated for a pneumonia and extubated yesterday.   Plan: 1. He would need an EGD when cardiology feel its safe from their point of view for anesthesia. In the interim continue PPI, monitor CBC daily, if low transfuse.   2. Appears he has a low  MCV - suggests a component of chronic blood loss. I see no iron studies ordered at any point since 2015. Will order iron studies , b12 and folate,   Discussed with Dr Bridgett Larsson    LOS: 3 days   Edwin Bellows, MD 06/28/2018, 4:16 PM

## 2018-06-28 NOTE — Evaluation (Signed)
Physical Therapy Evaluation Patient Details Name: Edwin Woodward MRN: [id number] DOB: 11/15/1950 Today's Date: 06/28/2018   History of Present Illness  Pt is a 67 y/o M who presented with chest pain, SOB, nausea, dizziness.  Pt with GI bleed and is s/p transfusion.  Pt went into acute respiratory failure likely due to an episode of vomiting with chemical pneumonitis and the pt was intubated, extubated 10/20.  Pt with cardiac arrest, possibly due to vasovagal response secondary to vomiting, no evidence of STEMI or NSTEMI per cardiology.  Pt had stress test performed on 10/21 which was normal per cardiology. Pt's PMH includes dementia, CAD s/p stent placement.     Clinical Impression  Pt admitted with above diagnosis. Pt currently with functional limitations due to the deficits listed below (see PT Problem List).  Edwin Woodward was limited due symptomatic +orthostatic this session. Pt performed sit>stand from bed x2 with close min guard due to instability.  Deferred gait assessment due to orthostatics but anticipate that as pt's medical status improves he will quickly return to PLOF. Pt will benefit from skilled PT to increase their independence and safety with mobility to allow discharge to the venue listed below.      Follow Up Recommendations Home health PT    Equipment Recommendations  None recommended by PT    Recommendations for Other Services       Precautions / Restrictions Precautions Precautions: Fall;Other (comment) Precaution Comments: monitor BP (+orthostatics), monitor HR Restrictions Weight Bearing Restrictions: No      Mobility  Bed Mobility Overal bed mobility: Needs Assistance Bed Mobility: Supine to Sit;Sit to Supine     Supine to sit: Min guard;HOB elevated Sit to supine: Min guard;HOB elevated   General bed mobility comments: Min guard provided for safety, no physical assist or cues needed  Transfers Overall transfer level: Needs assistance Equipment  used: None Transfers: Sit to/from Stand Sit to Stand: Min guard         General transfer comment: Pt stood from bed x2, each time reaching out for IV pole for support due to mild instability, no LOB.  After first sit>stand BP dropped and pt was instructed to return to sitting EOB.  After second sit>stand BP dropped again and pt reported and demonstrated BLE "shakiness"/instability.    Ambulation/Gait             General Gait Details: Not safe to attempt at this time due to symptomatic orthostatic hypotension  Stairs            Wheelchair Mobility    Modified Rankin (Stroke Patients Only)       Balance Overall balance assessment: Needs assistance Sitting-balance support: No upper extremity supported;Feet supported Sitting balance-Leahy Scale: Good     Standing balance support: No upper extremity supported;During functional activity Standing balance-Leahy Scale: Fair Standing balance comment: Pt able to stand statically without UE support but does reach out for IV pole with sit>stand                             Pertinent Vitals/Pain Pain Assessment: No/denies pain    Home Living Family/patient expects to be discharged to:: Private residence Living Arrangements: Spouse/significant other Available Help at Discharge: Family;Available 24 hours/day(wife works from home) Type of Home: House Home Access: Level entry     [place]: Two level;Able to live on main level with bedroom/bathroom Home Equipment: Cane - single point;Grab bars - tub/shower  Prior Function Level of Independence: Independent         Comments: Pt retired and ambualtes without AD, no recent falls.  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      Hand Dominance        Extremity/Trunk Assessment   Upper Extremity Assessment Upper Extremity Assessment: Overall WFL for tasks assessed    Lower Extremity  Assessment Lower Extremity Assessment: (BLE strength grossly 4/5)       Communication   Communication: HOH  Cognition Arousal/Alertness: Awake/alert Behavior During Therapy: WFL for tasks assessed/performed Overall Cognitive Status: Within Functional Limits for tasks assessed                                        General Comments General comments (skin integrity, edema, etc.): BP 93/56 supine at start of session, 114/55 in sitting, 93/47 in standing, 110/67 in sitting, 98/51 in standing and pt symptomatic with reports and demonstrating BLE "shakiness"/instability.  HR stable and SpO2 remained at or above 93% on 2L O2 throughout session.     Exercises     Assessment/Plan    PT Assessment Patient needs continued PT services  PT Problem List Decreased strength;Decreased activity tolerance;Decreased balance;Decreased safety awareness;Decreased knowledge of use of DME       PT Treatment Interventions DME instruction;Gait training;Functional mobility training;Therapeutic activities;Therapeutic exercise;Balance training;Neuromuscular re-education;Patient/family education    PT Goals (Current goals can be found in the Care Plan section)  Acute Rehab PT Goals Patient Stated Goal: to return to PLOF PT Goal Formulation: With patient Time For Goal Achievement: 07/12/18 Potential to Achieve Goals: Good    Frequency Min 2X/week   Barriers to discharge        Co-evaluation               AM-PAC PT "6 Clicks" Daily Activity  Outcome Measure Difficulty turning over in bed (including adjusting bedclothes, sheets and blankets)?: A Little Difficulty moving from lying on back to sitting on the side of the bed? : A Little Difficulty sitting down on and standing up from a chair with arms (e.g., wheelchair, bedside commode, etc,.)?: A Little Help needed moving to and from a bed to chair (including a wheelchair)?: A Little Help needed walking in hospital room?: A  Little Help needed climbing 3-5 steps with a railing? : A Little 6 Click Score: 18    End of Session Equipment Utilized During Treatment: Gait belt;Oxygen Activity Tolerance: Treatment limited secondary to medical complications (Comment)(+orthostatics) Patient left: in bed;with call bell/phone within reach;with bed alarm set Nurse Communication: Mobility status;Other (comment)(+orthostatics and BP readings) PT Visit Diagnosis: Unsteadiness on feet (R26.81);Muscle weakness (generalized) (M62.81)    Time: 0932-6712 PT Time Calculation (min) (ACUTE ONLY): 25 min   Charges:   PT Evaluation $PT Eval Moderate Complexity: 1 Mod PT Treatments $Therapeutic Activity: 8-22 mins        Collie Siad PT, DPT 06/28/2018, 4:34 PM

## 2018-06-28 NOTE — Progress Notes (Signed)
Spoke with wife. She stressed that Edwin Woodward has early onset Alzheimers and we need to speak to her directly because the information she gets from him is inaccurate and confusing.

## 2018-06-28 NOTE — Plan of Care (Signed)
Encourage patient to increase activity. Up to chair for meals and ambulating in the room. No complaints of chest pain.

## 2018-06-28 NOTE — Progress Notes (Signed)
Progress Note  Patient Name: Edwin Woodward Date of Encounter: 06/28/2018  Primary Cardiologist: Rockey Situ  Subjective   Mr. Brander is awake in bed. He states that he is feeling much better than he did. He reports no further chest pain, though he states that when he thinks about how he felt before he remembers it and gets pressure from the memory. He denies shortness of breath, swelling or abdominal pain. He had questions about his heart and why he is bleeding.  He is for [employer] this morning. BP remains soft in the 25K systolic.   Inpatient Medications    Scheduled Meds: . alfuzosin  10 mg Oral Daily  . atorvastatin  40 mg Oral q1800  . buPROPion  150 mg Oral BID  . calcium carbonate  500 mg of elemental calcium Oral BID WC  . citalopram  30 mg Oral Daily  . donepezil  5 mg Oral BID  . finasteride  5 mg Oral Daily  . ipratropium-albuterol  3 mL Nebulization TID  . magnesium oxide  400 mg Oral BID  . mouth rinse  15 mL Mouth Rinse BID  . pantoprazole  40 mg Oral BID AC  . risperiDONE  0.25 mg Oral BID   Continuous Infusions: . sodium chloride 50 mL/hr at 06/27/18 1357  . sodium chloride 500 mL (06/28/18 0004)  . piperacillin-tazobactam (ZOSYN)  IV 3.375 g (06/28/18 0004)   PRN Meds: sodium chloride, acetaminophen **OR** acetaminophen, nitroGLYCERIN, phenol   Vital Signs    Vitals:   06/27/18 1450 06/27/18 1937 06/27/18 2030 06/28/18 0524  BP:  (!) 105/54  (!) 95/58  Pulse:  76  67  Resp:  18  18  Temp:  99.1 F (37.3 C)  98.8 F (37.1 C)  TempSrc:  Oral  Oral  SpO2: 92% 94% 95% 91%  Weight:      Height:        Intake/Output Summary (Last 24 hours) at 06/28/2018 0719 Last data filed at 06/28/2018 0700 Gross per 24 hour  Intake 2107.08 ml  Output 1050 ml  Net 1057.08 ml   Filed Weights   06/25/18 0650 06/27/18 1355  Weight: 79.8 kg 78.4 kg    Physical Exam   GEN: Well nourished, well developed, in no acute distress.  HEENT: Grossly  normal.  Neck: Supple, no JVD. Cardiac: RRR, no murmurs, rubs, or gallops. No clubbing, cyanosis, edema.    Respiratory:  Respirations regular and unlabored, clear to auscultation bilaterally. GI: Soft, nontender, nondistended. MS: no deformity or atrophy. Skin: Pale, warm and dry, no rash. Neuro:  Strength and sensation are intact. Psych:  Pleasant, normal affect.  Labs    Chemistry Recent Labs  Lab 06/25/18 0654 06/25/18 1015 06/26/18 0406  NA 138  --  142  K 4.6  --  4.6  CL 105  --  111  CO2 22  --  24  GLUCOSE 141*  --  126*  BUN 57*  --  43*  CREATININE 1.20  --  1.39*  CALCIUM 9.1  --  8.4*  PROT 6.7 6.5 5.9*  ALBUMIN 3.8 3.5 3.5  AST 25 24 23   ALT 25 23 20   ALKPHOS 63 61 53  BILITOT 1.0 0.9 0.8  GFRNONAA >60  --  51*  GFRAA >60  --  59*  ANIONGAP 11  --  7     Hematology Recent Labs  Lab 06/25/18 2254 06/26/18 0037 06/26/18 0406 06/27/18 0432  WBC 16.9*  --  18.7* 14.2*  RBC 5.14  --  4.94 3.95*  HGB 12.0* 11.8* 11.6* 9.1*  HCT 36.3*  --  35.1* 28.5*  MCV 70.6*  --  71.1* 72.2*  MCH 23.3*  --  23.5* 23.0*  MCHC 33.1  --  33.0 31.9  RDW 20.1*  --  20.3* 21.1*  PLT 227  --  212 163    Cardiac Enzymes Recent Labs  Lab 06/25/18 0654 06/25/18 1015 06/26/18 0406  TROPONINI <0.03 0.03* <0.03    BNP Recent Labs  Lab 06/25/18 0654  BNP 17.0    Radiology    CXR 06/26/2018 COMPARISON:  One-view chest x-ray 06/25/2018  FINDINGS: Heart size is normal. Endotracheal tube terminates 3 cm above the carina. A left subclavian line is stable. NG tube courses off the inferior border the film. Defibrillator pads are in place. Lung volumes are improved. No focal airspace disease is present.  IMPRESSION: 1. Support apparatus stable. 2. Improving lung volumes and aeration.  CT HEAD 06/25/2018 FINDINGS: Brain: No evidence of acute infarction, hemorrhage, extra-axial collection, ventriculomegaly, or mass effect. Small left choroidal fissure  cyst. Generalized cerebral atrophy. Periventricular white matter low attenuation likely secondary to microangiopathy.  Vascular: Cerebrovascular atherosclerotic calcifications are noted.  Skull: Negative for fracture or focal lesion.  Sinuses/Orbits: Visualized portions of the orbits are unremarkable. Visualized portions of the paranasal sinuses are unremarkable. Visualized portions of the mastoid air cells are unremarkable.  Other: None.  IMPRESSION: No acute intracranial pathology.  CT ANGIO CHEST/ABDOMEN 06/25/2018 CTA CHEST FINDINGS  Cardiovascular: The thoracic aorta is normal in caliber and well opacified. No evidence of aneurysmal disease or aortic dissection. No significant atherosclerosis identified of the aorta or visualized proximal great vessels. The heart size is normal. No pericardial fluid identified. Central pulmonary arteries are of normal caliber. Calcified plaque versus stent present in the distribution of the LAD. There likely also is plaque versus stent in the distribution of the RCA.  Mediastinum/Nodes: Endotracheal tube present. No enlarged mediastinal, hilar, or axillary lymph nodes. Thyroid gland, trachea, and esophagus demonstrate no significant findings.  Lungs/Pleura: Posterior bibasilar opacities present, right greater than left most likely representing atelectasis. A component of possible aspiration is not excluded. There is no evidence of airspace edema, pneumothorax, nodule or pleural fluid.  Musculoskeletal: No chest wall abnormality. No acute or significant osseous findings.  CTA ABDOMEN AND PELVIS FINDINGS  VASCULAR  Aorta: The abdominal aorta is normally patent and demonstrates mild atherosclerotic plaque. No evidence of aneurysmal disease or dissection.  Celiac: Normally patent.  Normal distal branch vessel anatomy.  SMA: Normally patent.  Renals: Bilateral single renal arteries demonstrate normal  patency.  IMA: Normally patent.  Inflow: Bilateral iliac arteries demonstrate normal patency. Common femoral arteries and femoral bifurcations are normally patent.  Review of the MIP images confirms the above findings.  NON-VASCULAR  Hepatobiliary: No focal liver abnormality is seen. No gallstones, gallbladder wall thickening, or biliary dilatation.  Pancreas: Unremarkable. No pancreatic ductal dilatation or surrounding inflammatory changes.  Spleen: Normal in size without focal abnormality.  Adrenals/Urinary Tract: Adrenal glands are unremarkable. Kidneys are normal, without renal calculi, focal lesion, or hydronephrosis. Bladder is unremarkable.  Stomach/Bowel: Gastric decompression tube extends into the stomach. Bowel shows no evidence of obstruction, ileus or perforation. No inflammation identified. There is moderate stool in the rectum.  Lymphatic: No enlarged lymph nodes identified.  Reproductive: Prostate is unremarkable.  Other: A right inguinal hernia contains fat. No ascites or abscess identified.  Musculoskeletal: The lumbar spine  demonstrates degenerative disc disease. No bony fractures or lesions identified.  IMPRESSION: 1. No acute aortic pathology identified in the chest or abdomen. No vascular abnormalities identified. 2. Bibasilar opacities, right greater than left may represent atelectasis. Component of aspiration cannot be excluded, especially at the right lung base. 3. Coronary atherosclerosis with evidence of calcified plaque and/or stents in the LAD and RCA. 4. Right inguinal hernia containing fat.  Telemetry    Normal sinus rhythm with scattered PVCs - Personally Reviewed  ECG    06/25/2018 most recent, ST changes in leads I and AVL, no STEMI - Personally Reviewed  Cardiac Studies   Echocardiogram 06/26/2018 showed normal left systolic function.  Patient Profile     67 y.o. male with hx of CAD, s/p BMS to [facility], LAD  (2009), HTN, HLD, CKD III, frontal lobe dementia, GERD, bradycardia, orthostasis, gout, total b/l knee replacements, anxiety, ADHD, shoulder dislocation w/ fx (2018). Presented on 06/25/2018 with chest pain with borderline ST Elevation EKG. Urgent catheterization deferred when patient began vomiting blood. He was intubated and admitted to the ICU. Currently stable on tele unit s/p blood transfusion.  Assessment & Plan    1. EKG changes in the setting of chest pain: ECHO showed normal LV function and wall motion. EKG changes not consistent with STEMI. Troponin peaked at 0.03 x 1, with subsequent levels being < 0.03. Will proceed with stress test today for further evaluation.  2. G.I. Bleed: s/p blood transfusion. Continue to withhold Plavix and ASA. Manage per I.M.  3. Hypotension: Improved, still somewhat soft. Continue IVF and monitor.  Signed, Brenton Grills, Student-PA  06/28/2018, 7:19 AM     Patient seen, examined, and discussed on rounds with PA-S. Agree with the above. Rounded on patient in the stress lab with initial systolic BP if 62-83 mmHg. Stress test has been delayed this morning to allow for IV fluid hydration. May need to postpone Myoview until 10/22 if BP remains soft. Uncertain if he would be a candidate for treadmill, though this is unlikely given his frail state, and history of knee replacements.    Christell Faith, PA-C 06/28/2018 9:28 AM For questions or updates, please contact   Please consult [email or web address] for contact info under Cardiology/STEMI.

## 2018-06-28 NOTE — Care Management Important Message (Signed)
Initial IM signed.  Copy left in room with patient.

## 2018-06-28 NOTE — Progress Notes (Signed)
Edwin Woodward NAME: Edwin Woodward    MR#:  [id number]  DATE OF BIRTH:  [birth year]  SUBJECTIVE:  CHIEF COMPLAINT:   Chief Complaint  Patient presents with  . Chest Pain   The patient has no complaints, on O2 [place] 2 L. REVIEW OF SYSTEMS:  Review of Systems  Constitutional: Negative for chills, fever and malaise/fatigue.  HENT: Negative for sore throat.   Eyes: Negative for blurred vision and double vision.  Respiratory: Negative for cough, hemoptysis, shortness of breath, wheezing and stridor.   Cardiovascular: Negative for chest pain, palpitations, orthopnea and leg swelling.  Gastrointestinal: Negative for abdominal pain, blood in stool, diarrhea, melena, nausea and vomiting.  Genitourinary: Negative for dysuria, flank pain and hematuria.  Musculoskeletal: Negative for back pain and joint pain.  Skin: Negative for rash.  Neurological: Negative for dizziness, sensory change, focal weakness, seizures, loss of consciousness, weakness and headaches.  Endo/Heme/Allergies: Negative for polydipsia.  Psychiatric/Behavioral: Negative for depression. The patient is not nervous/anxious.     DRUG ALLERGIES:   Allergies  Allergen Reactions  . Biaxin [Clarithromycin]   . Clarithromycin   . Codeine     Sever vomiting   . Penicillins   . Promethazine   . Iodinated Diagnostic Agents Rash  . Metrizamide Rash   VITALS:  Blood pressure (!) 97/56, pulse 71, temperature 99 F (37.2 C), temperature source Oral, resp. rate 16, height 5\' 3"  (1.6 m), weight 78.4 kg, SpO2 97 %. PHYSICAL EXAMINATION:  Physical Exam  Constitutional: He is oriented to person, place, and time. No distress.  HENT:  Head: Normocephalic.  Mouth/Throat: Oropharynx is clear and moist.  Eyes: Pupils are equal, round, and reactive to light. Conjunctivae and EOM are normal. No scleral icterus.  Neck: Normal range of motion. Neck supple. No JVD present. No tracheal  deviation present.  Cardiovascular: Normal rate, regular rhythm and normal heart sounds. Exam reveals no gallop.  No murmur heard. Pulmonary/Chest: Effort normal and breath sounds normal. No respiratory distress. He has no wheezes. He has no rales.  Abdominal: Soft. Bowel sounds are normal. He exhibits no distension. There is no tenderness. There is no rebound.  Musculoskeletal: Normal range of motion. He exhibits no edema or tenderness.  Neurological: He is alert and oriented to person, place, and time. No cranial nerve deficit.  Skin: No rash noted. No erythema.  Psychiatric: He has a normal mood and affect.   LABORATORY PANEL:  Male CBC Recent Labs  Lab 06/27/18 0432  WBC 14.2*  HGB 9.1*  HCT 28.5*  PLT 163   ------------------------------------------------------------------------------------------------------------------ Chemistries  Recent Labs  Lab 06/26/18 0406 06/27/18 1255  NA 142  --   K 4.6  --   CL 111  --   CO2 24  --   GLUCOSE 126*  --   BUN 43*  --   CREATININE 1.39*  --   CALCIUM 8.4*  --   MG 1.9 1.9  AST 23  --   ALT 20  --   ALKPHOS 53  --   BILITOT 0.8  --    RADIOLOGY:  Nm Myocar Multi W/spect W/wall Motion / Ef  Result Date: 06/28/2018  There was no ST segment deviation noted during stress.  No T wave inversion was noted during stress.  The study is normal.  This is a low risk study.  The left ventricular ejection fraction is normal (55-65%).    ASSESSMENT AND PLAN:  Edwin Woodward  is a 67 y.o. male with a known history of coronary artery disease status post stent in the past, hypertension, hyperlipidemia, dementia/cognitive decline came to the emergency room by EMS after he started having some chest pain around 9 PM last night. Patient reported some shortness of breath nausea and dizziness.  1. Acute respiratory failure likely due to an episode of vomiting with some chemical pneumonitis The patient was intubated for airway  protection and put on the ventilator, extubated. Try to wean off O2 [place].  Discontinue Zosyn IV, changed to Augmentin p.o.  Follow-up CBC.   2.  Cardiac arrest, possible due to vasovagal response secondary vomiting. No evidence of STEMI or non-STEMI per Dr. Fletcher Anon. Stress test is normal.  3 G.I. Bleed received two units of positive blood. No active bleeding. Hemoglobin decreased to 9.1, possible due to IV fluid dilution.  Follow-up hemoglobin and GI. Discontinued IV Protonix drip, changed to p.o. Protonix twice daily..  4.   Hypotension. Improved with IV fluids, IV levophed and transfusion.  5. Chest pain with history of coronary artery disease status post bare metal stent in the past -patient was on aspirin and Plavix, which were hold due to GI bleeding. No MI, normal stress test per Dr. Fletcher Anon.  6.   Acute renal failure, possible due to hypotension.  IV fluid support and follow-up BMP.  5.   Lactic acidosis, due to above, follow-up level.  6. History of dementia  I discussed with Dr. Fletcher Anon and Dr. Vicente Males. All the records are reviewed and case discussed with Care Management/Social Worker. Management plans discussed with the patient, family and they are in agreement.  CODE STATUS: Full Code  TOTAL TIME TAKING CARE OF THIS PATIENT: 26 minutes.  More than 50% of the time was spent in counseling/coordination of care: YES  POSSIBLE D/C IN 2 DAYS, DEPENDING ON CLINICAL CONDITION.   [name] M.D on 06/28/2018 at 3:50 PM  Between 7am to 6pm - Pager - [phone]  After 6pm go to [email or web address] - [facility]

## 2018-06-28 NOTE — Consult Note (Signed)
Pharmacy Antibiotic Note  Edwin Woodward is a 67 y.o. male admitted on 06/25/2018 with asipiration pneumonia.  Pharmacy has been consulted for Augmentin dosing. Clarified consult with Dr. Bridgett Larsson.  Patient has history of PCN allergy listed but though to be not true allergy with discussion with Dr. Bridgett Larsson.  Patient received Zosyn without reaction.  Plan: Augmentin 875 mg every 12 hours  Height: 5\' 3"  (160 cm) Weight: 172 lb 14.4 oz (78.4 kg) IBW/kg (Calculated) : 56.9  Temp (24hrs), Avg:99 F (37.2 C), Min:98.8 F (37.1 C), Max:99.1 F (37.3 C)  Recent Labs  Lab 06/25/18 0654 06/25/18 1015 06/25/18 1412 06/25/18 2254 06/26/18 0406 06/27/18 0432  WBC 12.4* 17.9*  --  16.9* 18.7* 14.2*  CREATININE 1.20  --   --   --  1.39*  --   LATICACIDVEN  --  2.1* 1.4  --  2.2*  --     Estimated Creatinine Clearance: 47.1 mL/min (A) (by C-G formula based on SCr of 1.39 mg/dL (H)).    Allergies  Allergen Reactions  . Biaxin [Clarithromycin]   . Clarithromycin   . Codeine     Sever vomiting   . Penicillins   . Promethazine   . Iodinated Diagnostic Agents Rash  . Metrizamide Rash    Antimicrobials this admission: Zosyn 10/19 >> 10/21 Augmentin PO 10/21 >>   Dose adjustments this admission:   Microbiology results:  BCx:   UCx:    Sputum:    MRSA PCR:   Thank you for allowing pharmacy to be a part of this patient's care.  Forrest Moron, PharmD 06/28/2018 5:03 PM

## 2018-06-28 NOTE — Progress Notes (Addendum)
CRITICAL VALUE ALERT  Critical Value:  Lactic Acid 4.0 Date & Time Notied:  06/28/18  1910  Provider Notified: Dr. Fritzi Mandes, MD on call  Orders Received/Actions taken: Dr. Posey Pronto will contact Dr. Bridgett Larsson the ordering physician and call me back.  Dr Su Hilt to Dr. Bridgett Larsson. Per Dr. Bridgett Larsson, no orders at this time. Repeat Lactic Acid in the AM.

## 2018-06-29 ENCOUNTER — Encounter: Payer: Self-pay | Admitting: Anesthesiology

## 2018-06-29 ENCOUNTER — Encounter
Admission: EM | Disposition: A | Discharge status: Home Health | Payer: Self-pay | Source: Home / Self Care | Attending: Internal Medicine

## 2018-06-29 ENCOUNTER — Inpatient Hospital Stay: Payer: Medicare HMO | Admitting: Anesthesiology

## 2018-06-29 DIAGNOSIS — K921 Melena: Secondary | ICD-10-CM

## 2018-06-29 DIAGNOSIS — K259 Gastric ulcer, unspecified as acute or chronic, without hemorrhage or perforation: Secondary | ICD-10-CM

## 2018-06-29 HISTORY — PX: ESOPHAGOGASTRODUODENOSCOPY (EGD) WITH PROPOFOL: SHX5813

## 2018-06-29 LAB — VITAMIN B12: Vitamin B-12: 663 pg/mL (ref 180–914)

## 2018-06-29 LAB — LACTIC ACID, PLASMA: Lactic Acid, Venous: 1 mmol/L (ref 0.5–1.9)

## 2018-06-29 SURGERY — ESOPHAGOGASTRODUODENOSCOPY (EGD) WITH PROPOFOL
Anesthesia: General

## 2018-06-29 MED ORDER — PROPOFOL 500 MG/50ML IV EMUL
INTRAVENOUS | Status: DC | PRN
  Administered 2018-06-29: 100 ug/kg/min via INTRAVENOUS

## 2018-06-29 MED ORDER — SODIUM CHLORIDE 0.9 % IV SOLN
INTRAVENOUS | Status: DC
  Administered 2018-06-29: 20 mL/h via INTRAVENOUS

## 2018-06-29 MED ORDER — LIDOCAINE HCL (CARDIAC) PF 100 MG/5ML IV SOSY
PREFILLED_SYRINGE | INTRAVENOUS | Status: DC | PRN
  Administered 2018-06-29: 40 mg via INTRAVENOUS

## 2018-06-29 MED ORDER — FAMOTIDINE IN NACL 20-0.9 MG/50ML-% IV SOLN
20.0000 mg | Freq: Two times a day (BID) | INTRAVENOUS | Status: DC
  Administered 2018-06-29: 20 mg via INTRAVENOUS
  Filled 2018-06-29: qty 50

## 2018-06-29 MED ORDER — LIDOCAINE HCL (PF) 2 % IJ SOLN
INTRAMUSCULAR | Status: AC
  Filled 2018-06-29: qty 10

## 2018-06-29 MED ORDER — BOOST / RESOURCE BREEZE PO LIQD CUSTOM
1.0000 | Freq: Three times a day (TID) | ORAL | Status: DC
  Administered 2018-06-30: 1 via ORAL

## 2018-06-29 MED ORDER — SODIUM CHLORIDE 0.9 % IV SOLN
510.0000 mg | Freq: Once | INTRAVENOUS | Status: AC
  Administered 2018-06-29: 510 mg via INTRAVENOUS
  Filled 2018-06-29: qty 17

## 2018-06-29 MED ORDER — PEG 3350-KCL-NA BICARB-NACL 420 G PO SOLR
4000.0000 mL | Freq: Once | ORAL | Status: DC
  Filled 2018-06-29: qty 4000

## 2018-06-29 MED ORDER — POTASSIUM CHLORIDE 20 MEQ PO PACK
40.0000 meq | PACK | Freq: Once | ORAL | Status: AC
  Administered 2018-06-29: 40 meq via ORAL
  Filled 2018-06-29: qty 2

## 2018-06-29 MED ORDER — PROPOFOL 10 MG/ML IV BOLUS
INTRAVENOUS | Status: AC
  Filled 2018-06-29: qty 20

## 2018-06-29 MED ORDER — SODIUM CHLORIDE 0.9 % IV SOLN
INTRAVENOUS | Status: DC
  Administered 2018-06-29: 13:00:00 via INTRAVENOUS

## 2018-06-29 NOTE — Plan of Care (Signed)
  Problem: Clinical Measurements: Goal: Cardiovascular complication will be avoided Outcome: Progressing Note:  Denies chest pain   Problem: Clinical Measurements: Goal: Diagnostic test results will improve Outcome: Not Progressing Note:  K 3.2, MG 1.9 Latic 1 HGB 8.2/25.8

## 2018-06-29 NOTE — Transfer of Care (Signed)
Immediate Anesthesia Transfer of Care Note  Patient: Edwin Woodward  Procedure(s) Performed: Procedure(s): ESOPHAGOGASTRODUODENOSCOPY (EGD) WITH PROPOFOL (N/A)  Patient Location: PACU and Endoscopy Unit  Anesthesia Type:General  Level of Consciousness: sedated  Airway & Oxygen Therapy: Patient Spontanous Breathing and Patient connected to nasal cannula oxygen  Post-op Assessment: Report given to RN and Post -op Vital signs reviewed and stable  Post vital signs: Reviewed and stable  Last Vitals:  Vitals:   06/29/18 1512 06/29/18 1704  BP: 135/70 99/61  Pulse: 71 69  Resp: 20 16  Temp: 36.4 C (!) 36.1 C  SpO2: 71% 85%    Complications: No apparent anesthesia complications

## 2018-06-29 NOTE — Op Note (Signed)
[facility] Patient Name: Edwin Woodward Procedure Date: 06/29/2018 4:41 PM MRN: [id number] Account #: [id number] Date of Birth: 24-Jun-1951 Admit Type: Outpatient Age: 67 Room: [facility] ENDO ROOM 4 Gender: Male Note Status: Finalized Procedure:            Upper GI endoscopy Indications:          Melena Providers:            [name] MD, MD Referring MD:         Deborra Medina, MD (Referring MD) Medicines:            Monitored Anesthesia Care Complications:        No immediate complications. Procedure:            Pre-Anesthesia Assessment:                       - Prior to the procedure, a History and Physical was                        performed, and patient medications, allergies and                        sensitivities were reviewed. The patient's tolerance of                        previous anesthesia was reviewed.                       - The risks and benefits of the procedure and the                        sedation options and risks were discussed with the                        patient. All questions were answered and informed                        consent was obtained.                       - ASA Grade Assessment: III - A patient with severe                        systemic disease.                       After obtaining informed consent, the endoscope was                        passed under direct vision. Throughout the procedure,                        the patient's blood pressure, pulse, and oxygen                        saturations were monitored continuously. The Endoscope                        was introduced through the mouth, and advanced to the  third part of duodenum. The upper GI endoscopy was                        accomplished with ease. The patient tolerated the                        procedure well. Findings:      The esophagus was normal.      The stomach was normal.      The examined duodenum was  normal.      Two non-bleeding superficial gastric ulcers with a clean ulcer base       (Forrest Class III) were found in the prepyloric region of the stomach.       The largest lesion was 5 mm in largest dimension.      The cardia and gastric fundus were normal on retroflexion. Impression:           - Normal esophagus.                       - Normal stomach.                       - Normal examined duodenum.                       - Non-bleeding gastric ulcers with a clean ulcer base                        (Forrest Class III).                       - No specimens collected. Recommendation:       - Return patient to hospital ward for ongoing care.                       - Two tiny ulcers in the stomach ?related to NG trauma                        when intubated. I am not sure if this could explain his                        GI bleed hence suggest we proceed with colonoscopy                        tomorrow. Procedure Code(s):    --- Professional ---                       [phone], Esophagogastroduodenoscopy, flexible, transoral;                        diagnostic, including collection of specimen(s) by                        brushing or washing, when performed (separate procedure) Diagnosis Code(s):    --- Professional ---                       K25.9, Gastric ulcer, unspecified as acute or chronic,                        without hemorrhage or perforation  K92.1, Melena (includes Hematochezia) CPT copyright 2018 American Medical Association. All rights reserved. The codes documented in this report are preliminary and upon coder review may  be revised to meet current compliance requirements. [name], MD [name] MD, MD 06/29/2018 4:59:25 PM This report has been signed electronically. Number of Addenda: 0 Note Initiated On: 06/29/2018 4:41 PM      [facility]

## 2018-06-29 NOTE — Progress Notes (Signed)
[facility] NAME: Edwin Woodward    MR#:  [id number]  DATE OF BIRTH:  05-Jun-1951  SUBJECTIVE:  CHIEF COMPLAINT:   Chief Complaint  Patient presents with  . Chest Pain   The patient has dizziness, generalized weakness and melena, hemoglobin decreased to 8.2, on O2 [place] 2 L. REVIEW OF SYSTEMS:  Review of Systems  Constitutional: Positive for malaise/fatigue. Negative for chills and fever.  HENT: Negative for sore throat.   Eyes: Negative for blurred vision and double vision.  Respiratory: Negative for cough, hemoptysis, shortness of breath, wheezing and stridor.   Cardiovascular: Negative for chest pain, palpitations, orthopnea and leg swelling.  Gastrointestinal: Positive for melena. Negative for abdominal pain, blood in stool, diarrhea, nausea and vomiting.  Genitourinary: Negative for dysuria, flank pain and hematuria.  Musculoskeletal: Negative for back pain and joint pain.  Skin: Negative for rash.  Neurological: Positive for dizziness. Negative for sensory change, focal weakness, seizures, loss of consciousness, weakness and headaches.  Endo/Heme/Allergies: Negative for polydipsia.  Psychiatric/Behavioral: Negative for depression. The patient is not nervous/anxious.    DRUG ALLERGIES:   Allergies  Allergen Reactions  . Biaxin [Clarithromycin]   . Clarithromycin   . Codeine     Sever vomiting   . Penicillins   . Promethazine   . Iodinated Diagnostic Agents Rash  . Metrizamide Rash   VITALS:  Blood pressure 128/76, pulse 64, temperature 98.9 F (37.2 C), temperature source Oral, resp. rate 17, height 5\' 3"  (1.6 m), weight 78.4 kg, SpO2 96 %. PHYSICAL EXAMINATION:  Physical Exam  Constitutional: He is oriented to person, place, and time. No distress.  HENT:  Head: Normocephalic.  Mouth/Throat: Oropharynx is clear and moist.  Eyes: Pupils are equal, round, and reactive to light. Conjunctivae and EOM are normal.  No scleral icterus.  Neck: Normal range of motion. Neck supple. No JVD present. No tracheal deviation present.  Cardiovascular: Normal rate, regular rhythm and normal heart sounds. Exam reveals no gallop.  No murmur heard. Pulmonary/Chest: Effort normal and breath sounds normal. No respiratory distress. He has no wheezes. He has no rales.  Abdominal: Soft. Bowel sounds are normal. He exhibits no distension. There is no tenderness. There is no rebound.  Musculoskeletal: Normal range of motion. He exhibits no edema or tenderness.  Neurological: He is alert and oriented to person, place, and time. No cranial nerve deficit.  Skin: No rash noted. No erythema.  Psychiatric: He has a normal mood and affect.   LABORATORY PANEL:  Male CBC Recent Labs  Lab 06/28/18 1829  WBC 8.9  HGB 8.2*  HCT 25.8*  PLT 150   ------------------------------------------------------------------------------------------------------------------ Chemistries  Recent Labs  Lab 06/26/18 0406 06/27/18 1255 06/28/18 1829  NA 142  --  139  K 4.6  --  3.2*  CL 111  --  109  CO2 24  --  24  GLUCOSE 126*  --  166*  BUN 43*  --  15  CREATININE 1.39*  --  1.03  CALCIUM 8.4*  --  7.7*  MG 1.9 1.9  --   AST 23  --   --   ALT 20  --   --   ALKPHOS 53  --   --   BILITOT 0.8  --   --    RADIOLOGY:  No results found. ASSESSMENT AND PLAN:   Edwin Woodward  is a 67 y.o. male with a known history of coronary artery disease  status post stent in the past, hypertension, hyperlipidemia, dementia/cognitive decline came to the emergency room by EMS after he started having some chest pain around 9 PM last night. Patient reported some shortness of breath nausea and dizziness.  1. Acute respiratory failure likely due to an episode of vomiting with some chemical pneumonitis The patient was intubated for airway protection and put on the ventilator, extubated. Try to wean off O2 [place].  Discontinued Zosyn IV, changed to Augmentin  p.o. leukocytosis improved..   2.  Cardiac arrest, possible due to vasovagal response secondary vomiting. No evidence of STEMI or non-STEMI per Dr. Fletcher Anon. Stress test is normal.  Low risk for endoscopy per Dr. Fletcher Anon.  3 Anemia due to acute blood loss secondary to G.I. Bleed received two units of positive blood.  Melena today. Hemoglobin decreased to 8.2, Follow-up hemoglobin. Restart IV Protonix drip. EGD today per Dr. Vicente Males.  4.   Hypotension. Improved with IV fluids, IV levophed and transfusion.  5. Chest pain with history of coronary artery disease status post bare metal stent in the past -patient was on aspirin and Plavix, which were hold due to GI bleeding. No MI, normal stress test per Dr. Fletcher Anon.  6.   Acute renal failure, possible due to hypotension.  Improved with IV fluid support.  5.   Lactic acidosis, due to above, improved.  6. History of dementia  Hypokalemia.  Potassium supplement and follow-up level.  I discussed with Dr. Saunders Revel, Dr. Fletcher Anon and Dr. Vicente Males. All the records are reviewed and case discussed with Care Management/Social Worker. Management plans discussed with the patient, family and they are in agreement.  CODE STATUS: Full Code  TOTAL TIME TAKING CARE OF THIS PATIENT: 38 minutes.  More than 50% of the time was spent in counseling/coordination of care: YES  POSSIBLE D/C IN 2 DAYS, DEPENDING ON CLINICAL CONDITION.   [name] M.D on 06/29/2018 at 2:58 PM  Between 7am to 6pm - Pager - [phone]  After 6pm go to [email or web address] - [facility]

## 2018-06-29 NOTE — Anesthesia Post-op Follow-up Note (Signed)
Anesthesia QCDR form completed.        

## 2018-06-29 NOTE — Plan of Care (Signed)
  Problem: Elimination: Goal: Will not experience complications related to bowel motility Outcome: Progressing   Problem: Safety: Goal: Ability to remain free from injury will improve Outcome: Progressing   Problem: Skin Integrity: Goal: Risk for impaired skin integrity will decrease Outcome: Progressing   

## 2018-06-29 NOTE — Anesthesia Procedure Notes (Signed)
Date/Time: 06/29/2018 4:53 PM Performed by: Doreen Salvage, CRNA Pre-anesthesia Checklist: Patient identified, Emergency Drugs available, Suction available and Patient being monitored Patient Re-evaluated:Patient Re-evaluated prior to induction Oxygen Delivery Method: Nasal cannula Induction Type: IV induction Dental Injury: Teeth and Oropharynx as per pre-operative assessment  Comments: Nasal cannula with etCO2 monitoring

## 2018-06-29 NOTE — Anesthesia Preprocedure Evaluation (Signed)
Anesthesia Evaluation  Patient identified by MRN, date of birth, ID band Patient awake    Reviewed: Allergy & Precautions, H&P , NPO status , Patient's Chart, lab work & pertinent test results  History of Anesthesia Complications Negative for: history of anesthetic complications  Airway Mallampati: III  TM Distance: <3 FB Neck ROM: full    Dental  (+) Chipped, Poor Dentition, Missing   Pulmonary neg shortness of breath, sleep apnea , former smoker,           Cardiovascular Exercise Tolerance: Good hypertension, (-) angina+ CAD and + Cardiac Stents       Neuro/Psych PSYCHIATRIC DISORDERS  Neuromuscular disease    GI/Hepatic Neg liver ROS, GERD  Medicated and Controlled,  Endo/Other  negative endocrine ROS  Renal/GU Renal disease  negative genitourinary   Musculoskeletal  (+) Arthritis ,   Abdominal   Peds  Hematology negative hematology ROS (+)   Anesthesia Other Findings Patient is NPO appropriate and reports no nausea or vomiting today.   Past Medical History: No date: CAD (coronary artery disease) No date: Cardiovascular stress test abnormal     Comment:  Jan 2010 no significant ischemia No date: Hyperlipidemia No date: Hypertension No date: Presence of bare metal stent in right coronary artery     Comment:  proximal RCA adn LAD in April 2009 No date: Trigger finger  Past Surgical History: No date: APPENDECTOMY No date: APPENDECTOMY No date: CARPAL TUNNEL RELEASE No date: CATARACT EXTRACTION     Comment:  right 12/2007: CORONARY ANGIOPLASTY No date: CORONARY ARTERY BYPASS GRAFT     Comment:  2 vessel No date: JOINT REPLACEMENT     Comment:  total knee No date: REPLACEMENT TOTAL KNEE No date: TONSILLECTOMY No date: VASECTOMY  BMI    Body Mass Index:  30.63 kg/m      Reproductive/Obstetrics negative OB ROS                             Anesthesia Physical Anesthesia  Plan  ASA: III  Anesthesia Plan: General   Post-op Pain Management:    Induction: Intravenous  PONV Risk Score and Plan: Propofol infusion and TIVA  Airway Management Planned: Natural Airway and Nasal Cannula  Additional Equipment:   Intra-op Plan:   Post-operative Plan:   Informed Consent: I have reviewed the patients History and Physical, chart, labs and discussed the procedure including the risks, benefits and alternatives for the proposed anesthesia with the patient or authorized representative who has indicated his/her understanding and acceptance.   Dental Advisory Given  Plan Discussed with: Anesthesiologist, CRNA and Surgeon  Anesthesia Plan Comments: (Patient consented for risks of anesthesia including but not limited to:  - adverse reactions to medications - risk of intubation if required - damage to teeth, lips or other oral mucosa - sore throat or hoarseness - Damage to heart, brain, lungs or loss of life  Patient voiced understanding.)        Anesthesia Quick Evaluation

## 2018-06-29 NOTE — Progress Notes (Addendum)
Nutrition Follow Up Note   DOCUMENTATION CODES:   Not applicable  INTERVENTION:   Boost Breeze po TID, each supplement provides 250 kcal and 9 grams of protein  NUTRITION DIAGNOSIS:   Inadequate oral intake related to acute illness(GIB) as evidenced by other (comment)(Pt on clear liquid diet ).  GOAL:   Patient will meet greater than or equal to 90% of their needs  -not met   MONITOR:   PO intake, Supplement acceptance, Labs, Weight trends, Diet advancement, Skin, I & O's  ASSESSMENT:    67 y.o. male with a known history of coronary artery disease status post stent in the past, hypertension, hyperlipidemia, dementia/cognitive decline admitted for chest pain and GI bleed  Pt extubated 10/19. Pt on NPO/clear liquid diet since admit; pt awaiting EGD. Pt is eating 100% of his clear liquid diet. RD will add Boost Breeze to help pt meet his estimated needs. Per chart, pt is weight stable. RD will continue to monitor for diet advancement. Pt likely at refeed risk; recommend monitor K, Mg and P once diet advanced.   Medications reviewed and include: augmentin, tums, celexa, protonix, Mg oxide, protonix  Labs reviewed: K 3.2(L) P 4.2 wnl, Mg 1.9 wnl- 10/19 Iron 20(L), TIBC 209(L), ferritin 88, folate 22.7, B12 663- 10/21 Hgb 8.2(L), Hct 25.8(L), MCV 73.5(L), MCH 23.4(L)- 10/21  Diet Order:   Diet Order            Diet NPO time specified  Diet effective midnight        Diet clear liquid Room service appropriate? Yes; Fluid consistency: Thin  Diet effective now             EDUCATION NEEDS:   No education needs have been identified at this time  Skin:  Skin Assessment: Reviewed RN Assessment  Last BM:  10/21  Height:   Ht Readings from Last 1 Encounters:  06/25/18 '5\' 3"'  (1.6 m)    Weight:   Wt Readings from Last 1 Encounters:  06/27/18 78.4 kg    Ideal Body Weight:  56.3 kg  BMI:  Body mass index is 30.63 kg/m.  Estimated Nutritional Needs:   Kcal:   1672kcal/day   Protein:  103-120g/day   Fluid:  >1.7L/day   Koleen Distance MS, RD, LDN Pager #- [phone] Office#- [phone] After Hours Pager: [phone]

## 2018-06-29 NOTE — Progress Notes (Signed)
To endo via bed 

## 2018-06-29 NOTE — Anesthesia Postprocedure Evaluation (Signed)
Anesthesia Post Note  Patient: Edwin Woodward  Procedure(s) Performed: ESOPHAGOGASTRODUODENOSCOPY (EGD) WITH PROPOFOL (N/A )  Patient location during evaluation: Endoscopy Anesthesia Type: General Level of consciousness: awake and alert Pain management: pain level controlled Vital Signs Assessment: post-procedure vital signs reviewed and stable Respiratory status: spontaneous breathing, nonlabored ventilation, respiratory function stable and patient connected to nasal cannula oxygen Cardiovascular status: blood pressure returned to baseline and stable Postop Assessment: no apparent nausea or vomiting Anesthetic complications: no     Last Vitals:  Vitals:   06/29/18 1724 06/29/18 1744  BP: 114/66 131/71  Pulse: 71 70  Resp: 12 15  Temp:  37 C  SpO2: 96% 96%    Last Pain:  Vitals:   06/29/18 1744  TempSrc: Oral  PainSc:                  Precious Haws [place]iscitello

## 2018-06-29 NOTE — Progress Notes (Signed)
Back from endo, no active bleeding at this time.  SL rt ac/lt hand.  No distress at this time.  Assisted up to bathroom, had stool, cleaned up, bed changed.  VSS stable.

## 2018-06-29 NOTE — H&P (Signed)
Edwin Bellows, MD [address] [place], [place], [place], [address] [address], [place], [place], [place], [address] Phone: [phone]  Fax: [phone]  Primary Care Physician:  [name], MD   Pre-Procedure History & Physical: HPI:  Edwin Woodward is a 67 y.o. male is here for an endoscopy    Past Medical History:  Diagnosis Date  . CAD (coronary artery disease)   . Cardiovascular stress test abnormal    Jan 2010 no significant ischemia  . Hyperlipidemia   . Hypertension   . Presence of bare metal stent in right coronary artery    proximal RCA adn LAD in April 2009  . Trigger finger     Past Surgical History:  Procedure Laterality Date  . APPENDECTOMY    . APPENDECTOMY    . CARPAL TUNNEL RELEASE    . CATARACT EXTRACTION     right  . CORONARY ANGIOPLASTY  12/2007  . CORONARY ARTERY BYPASS GRAFT     2 vessel  . JOINT REPLACEMENT     total knee  . REPLACEMENT TOTAL KNEE    . TONSILLECTOMY    . VASECTOMY      Prior to Admission medications   Medication Sig Start Date End Date Taking? Authorizing Provider  alfuzosin (UROXATRAL) 10 MG 24 hr tablet Take 1 tablet (10 mg total) by mouth daily. 05/23/16  Yes [name], MD  aspirin EC 81 MG tablet Take 1 tablet (81 mg total) by mouth daily. 08/15/15  Yes Minna Merritts, MD  atorvastatin (LIPITOR) 40 MG tablet Take 1 tablet (40 mg total) by mouth daily at 6 PM. Take 40 mg alt with 20 mg daily. 01/07/18  Yes Gollan, Kathlene November, MD  buPROPion (WELLBUTRIN SR) 150 MG 12 hr tablet TAKE 1 TABLET BY MOUTH TWICE A DAY 08/13/15  Yes Clapacs, Madie Reno, MD  calcium carbonate 1250 MG capsule Take 1,250 mg by mouth 2 (two) times daily with a meal.   Yes [provider]  citalopram (CELEXA) 20 MG tablet Take 30 mg by mouth daily. 03/20/16  Yes [provider]  clopidogrel (PLAVIX) 75 MG tablet Take 1 tablet (75 mg total) by mouth daily. 01/07/18  Yes Gollan, Kathlene November, MD  donepezil (ARICEPT) 5  MG tablet Take 5 mg by mouth 2 (two) times daily.  03/20/16  Yes [provider]  finasteride (PROSCAR) 5 MG tablet Take 5 mg by mouth daily.   Yes [provider]  magnesium oxide (MAG-OX) 400 MG tablet TAKE 1 TABLET (400 MG TOTAL) BY MOUTH 2 (TWO) TIMES DAILY AFTER A MEAL Patient taking differently: Take 400 mg by mouth 2 (two) times daily.  04/22/18  Yes [name], MD  nitroGLYCERIN (NITROSTAT) 0.4 MG SL tablet Place 1 tablet (0.4 mg total) under the tongue every 5 (five) minutes as needed. 04/11/11  Yes Minna Merritts, MD  risperiDONE (RISPERDAL) 0.25 MG tablet Take 0.25 mg by mouth 2 (two) times daily.  09/14/15 06/26/19 Yes [provider]  clopidogrel (PLAVIX) 75 MG tablet TAKE 1 TABLET (75 MG TOTAL) BY MOUTH DAILY. Patient not taking: Reported on 06/25/2018 05/31/18   Minna Merritts, MD  olopatadine (PATANOL) 0.1 % ophthalmic solution Place 1 drop into both eyes 2 (two) times daily. Patient not taking: Reported on 06/25/2018 06/22/17   [name], MD    Allergies as of 06/25/2018 - Review Complete 06/25/2018  Allergen Reaction Noted  .  Biaxin [clarithromycin]    . Clarithromycin  05/13/2011  . Codeine    . Penicillins    . Promethazine  10/05/2013  . Iodinated diagnostic agents Rash 03/21/2013  . Metrizamide Rash 03/21/2013    Family History  Problem Relation Age of Onset  . Hypertension Mother   . Diabetes Mother   . Thalassemia Mother   . Heart disease Father     Social History   Socioeconomic History  . Marital status: Married    Spouse name: Not on file  . Number of children: Not on file  . Years of education: Not on file  . Highest education level: Not on file  Occupational History  . Not on file  Social Needs  . Financial resource strain: Not on file  . Food insecurity:    Worry: Not on file    Inability: Not on file  . Transportation needs:    Medical: Not on file    Non-medical: Not on file  Tobacco Use  . Smoking  status: Former Research scientist (life sciences)  . Smokeless tobacco: Never Used  Substance and Sexual Activity  . Alcohol use: No    Alcohol/week: 0.0 standard drinks  . Drug use: No    Types: Marijuana  . Sexual activity: Not on file  Lifestyle  . Physical activity:    Days per week: Not on file    Minutes per session: Not on file  . Stress: Not on file  Relationships  . Social connections:    Talks on phone: Not on file    Gets together: Not on file    Attends religious service: Not on file    Active member of club or organization: Not on file    Attends meetings of clubs or organizations: Not on file    Relationship status: Not on file  . Intimate partner violence:    Fear of current or ex partner: Not on file    Emotionally abused: Not on file    Physically abused: Not on file    Forced sexual activity: Not on file  Other Topics Concern  . Not on file  Social History Narrative   Lives with wife.    Review of Systems: See HPI, otherwise negative ROS  Physical Exam: BP 135/70   Pulse 71   Temp 97.6 F (36.4 C) (Tympanic)   Resp 20   Ht 5\' 3"  (1.6 m)   Wt 78.4 kg   SpO2 99%   BMI 30.63 kg/m  General:   Alert,  pleasant and cooperative in NAD Head:  Normocephalic and atraumatic. Neck:  Supple; no masses or thyromegaly. Lungs:  Clear throughout to auscultation, normal respiratory effort.    Heart:  +S1, +S2, Regular rate and rhythm, No edema. Abdomen:  Soft, nontender and nondistended. Normal bowel sounds, without guarding, and without rebound.   Neurologic:  Alert and  oriented x4;  grossly normal neurologically.  Impression/Plan: Edwin Woodward is here for an endoscopy  to be performed for  evaluation of gi bleed    Risks, benefits, limitations, and alternatives regarding endoscopy have been reviewed with the patient.  Questions have been answered.  All parties agreeable.   Edwin Bellows, MD  06/29/2018, 4:04 PM

## 2018-06-29 NOTE — Progress Notes (Signed)
Edwin Woodward , MD [address] [place], [place], [place], [address] [address], [place], [place], [place], [address] Phone: [phone]  Fax: [phone]   Edwin Woodward is being followed for GI bleed   Subjective: Having black stools, no abdominal pain    Objective: Vital signs in last 24 hours: Vitals:   06/28/18 2046 06/29/18 0543 06/29/18 0751 06/29/18 0818  BP: 113/60 120/74  128/76  Pulse: 63 62  64  Resp:  16    Temp: 99.2 F (37.3 C) 98.2 F (36.8 C)  98.9 F (37.2 C)  TempSrc: Oral Oral  Oral  SpO2: 100% 98% 95% 96%  Weight:      Height:       Weight change:   Intake/Output Summary (Last 24 hours) at 06/29/2018 0935 Last data filed at 06/29/2018 0933 Gross per 24 hour  Intake 1433.66 ml  Output 525 ml  Net 908.66 ml     Exam: Heart:: Regular rate and rhythm, S1S2 present or without murmur or extra heart sounds Lungs: normal, clear to auscultation and clear to auscultation and percussion Abdomen: soft, nontender, normal bowel sounds  CBC Latest Ref Rng & Units 06/28/2018 06/27/2018 06/26/2018  WBC 4.0 - 10.5 K/uL 8.9 14.2(H) 18.7(H)  Hemoglobin 13.0 - 17.0 g/dL 8.2(L) 9.1(L) 11.6(L)  Hematocrit 39.0 - 52.0 % 25.8(L) 28.5(L) 35.1(L)  Platelets 150 - 400 K/uL 150 163 212    Lab Results: @LABTEST2 @ Micro Results: Recent Results (from the past 240 hour(s))  MRSA PCR Screening     Status: None   Collection Time: 06/25/18  9:25 AM  Result Value Ref Range Status   MRSA by PCR NEGATIVE NEGATIVE Final    Comment:        The GeneXpert MRSA Assay (FDA approved for NASAL specimens only), is one component of a comprehensive MRSA colonization surveillance program. It is not intended to diagnose MRSA infection nor to guide or monitor treatment for MRSA infections. Performed at [facility], [address] [place], [place] [address]   Culture, respiratory (non-expectorated)     Status: None   Collection Time: 06/26/18  9:21 AM   Result Value Ref Range Status   Specimen Description   Final    TRACHEAL ASPIRATE Performed at [facility], [address], [place], [place] [address]    Special Requests   Final    NONE Performed at [facility], [address] [place], [place] [address]    Gram Stain   Final    RARE WBC PRESENT, PREDOMINANTLY PMN FEW GRAM POSITIVE COCCI IN PAIRS IN CLUSTERS RARE GRAM POSITIVE RODS    Culture   Final    MODERATE Consistent with normal respiratory flora. Performed at [facility], [place] [address], [place], [place] [address]    Report Status 06/28/2018 FINAL  Final   Studies/Results: Nm Myocar Multi W/spect W/wall Motion / Ef  Result Date: 06/28/2018  There was no ST segment deviation noted during stress.  No T wave inversion was noted during stress.  The study is normal.  This is a low risk study.  The left ventricular ejection fraction is normal (55-65%).    Medications: I have reviewed the patient's current medications. Scheduled Meds: . alfuzosin  10 mg Oral Daily  . amoxicillin-clavulanate  1 tablet Oral Q12H  . atorvastatin  40 mg Oral q1800  . buPROPion  150 mg Oral BID  . calcium carbonate  500 mg of elemental calcium Oral  BID WC  . citalopram  30 mg Oral Daily  . donepezil  5 mg Oral BID  . finasteride  5 mg Oral Daily  . ipratropium-albuterol  3 mL Nebulization TID  . magnesium oxide  400 mg Oral BID  . mouth rinse  15 mL Mouth Rinse BID  . pantoprazole  40 mg Oral BID AC  . risperiDONE  0.25 mg Oral BID  . sodium chloride flush  3 mL Intravenous Q12H  . sodium chloride flush  3 mL Intravenous Q12H   Continuous Infusions: . sodium chloride Stopped (06/28/18 1845)   PRN Meds:.sodium chloride, acetaminophen **OR** acetaminophen, fludeoxyglucose F - 18, nitroGLYCERIN, phenol   Assessment: Active Problems:   GI bleed Edwin Woodward 67 y.o. male with concerns for angina , EKKG changes- per ardiology plan for stress test.  Admission complicated with hematemesis. S/p cardiac arrest . Was treated for a pneumonia and extubated day before  yesterday. Labs suggest iron deficiency   Plan: 1. EGD today as he has ongoing melena and drop in HB. At this time the benefits of EGD> any risks . He agrees to proceed  2. Low iron - suggest IV iron   I have discussed alternative options, risks & benefits,  which include, but are not limited to, bleeding, infection, perforation,respiratory complication & drug reaction.  The patient agrees with this plan & written consent will be obtained.         LOS: 4 days   Edwin Bellows, MD 06/29/2018, 9:35 AM

## 2018-06-29 NOTE — Progress Notes (Signed)
    Patient has undergone Lexiscan Myoview on 06/28/2018 that was low risk. He has been without any symptoms concerning for angina. From a cardiac perspective, he does not require any further inpatient evaluation. He is at least moderate risk for non-cardiac procedures.

## 2018-06-30 ENCOUNTER — Encounter
Admission: EM | Disposition: A | Discharge status: Home Health | Payer: Self-pay | Source: Home / Self Care | Attending: Internal Medicine

## 2018-06-30 ENCOUNTER — Inpatient Hospital Stay: Payer: Medicare HMO | Admitting: Anesthesiology

## 2018-06-30 DIAGNOSIS — K626 Ulcer of anus and rectum: Secondary | ICD-10-CM

## 2018-06-30 HISTORY — PX: COLONOSCOPY WITH PROPOFOL: SHX5780

## 2018-06-30 LAB — BASIC METABOLIC PANEL
Anion gap: 6 (ref 5–15)
BUN: 8 mg/dL (ref 8–23)
CO2: 28 mmol/L (ref 22–32)
CREATININE: 0.85 mg/dL (ref 0.61–1.24)
Calcium: 8.3 mg/dL — ABNORMAL LOW (ref 8.9–10.3)
Chloride: 109 mmol/L (ref 98–111)
Glucose, Bld: 96 mg/dL (ref 70–99)
Potassium: 3.6 mmol/L (ref 3.5–5.1)
SODIUM: 143 mmol/L (ref 135–145)

## 2018-06-30 LAB — HEMOGLOBIN: HEMOGLOBIN: 9.1 g/dL — AB (ref 13.0–17.0)

## 2018-06-30 LAB — LACTIC ACID, PLASMA: LACTIC ACID, VENOUS: 1.5 mmol/L (ref 0.5–1.9)

## 2018-06-30 SURGERY — COLONOSCOPY WITH PROPOFOL
Anesthesia: General

## 2018-06-30 MED ORDER — PHENYLEPHRINE HCL 10 MG/ML IJ SOLN
INTRAMUSCULAR | Status: DC | PRN
  Administered 2018-06-30: 100 ug via INTRAVENOUS

## 2018-06-30 MED ORDER — LIDOCAINE 2% (20 MG/ML) 5 ML SYRINGE
INTRAMUSCULAR | Status: DC | PRN
  Administered 2018-06-30: 40 mg via INTRAVENOUS

## 2018-06-30 MED ORDER — PANTOPRAZOLE SODIUM 40 MG PO TBEC: 40.0000 mg | DELAYED_RELEASE_TABLET | Freq: Every day | ORAL | 0 refills | Status: DC

## 2018-06-30 MED ORDER — FENTANYL CITRATE (PF) 100 MCG/2ML IJ SOLN
INTRAMUSCULAR | Status: AC
  Filled 2018-06-30: qty 2

## 2018-06-30 MED ORDER — PROPOFOL 10 MG/ML IV BOLUS
INTRAVENOUS | Status: DC | PRN
  Administered 2018-06-30: 100 mg via INTRAVENOUS

## 2018-06-30 MED ORDER — PROPOFOL 500 MG/50ML IV EMUL
INTRAVENOUS | Status: AC
  Filled 2018-06-30: qty 50

## 2018-06-30 MED ORDER — FENTANYL CITRATE (PF) 100 MCG/2ML IJ SOLN
INTRAMUSCULAR | Status: DC | PRN
  Administered 2018-06-30 (×2): 50 ug via INTRAVENOUS

## 2018-06-30 MED ORDER — PROPOFOL 500 MG/50ML IV EMUL
INTRAVENOUS | Status: DC | PRN
  Administered 2018-06-30: 160 ug/kg/min via INTRAVENOUS

## 2018-06-30 NOTE — Discharge Instructions (Signed)
HHPT °

## 2018-06-30 NOTE — Anesthesia Post-op Follow-up Note (Signed)
Anesthesia QCDR form completed.        

## 2018-06-30 NOTE — Progress Notes (Signed)
CM spoke to wife who is on the way to pick up patient.  She has been updated that he is cleared for discharge.  PIVs are out, patient did not have telemetry orders.  Packet has been made and waiting for pickup.

## 2018-06-30 NOTE — Op Note (Signed)
[facility] Patient Name: Edwin Woodward Procedure Date: 06/30/2018 1:32 PM MRN: [id number] Account #: [id number] Date of Birth: 11/30/1950 Admit Type: Inpatient Age: 67 Room: [facility] ENDO ROOM 1 Gender: Male Note Status: Finalized Procedure:            Colonoscopy Indications:          Melena Providers:            [name] MD, MD Referring MD:         Deborra Medina, MD (Referring MD) Medicines:            Monitored Anesthesia Care Complications:        No immediate complications. Procedure:            Pre-Anesthesia Assessment:                       - Prior to the procedure, a History and Physical was                        performed, and patient medications, allergies and                        sensitivities were reviewed. The patient's tolerance of                        previous anesthesia was reviewed.                       - The risks and benefits of the procedure and the                        sedation options and risks were discussed with the                        patient. All questions were answered and informed                        consent was obtained.                       - ASA Grade Assessment: III - A patient with severe                        systemic disease.                       After obtaining informed consent, the colonoscope was                        passed under direct vision. Throughout the procedure,                        the patient's blood pressure, pulse, and oxygen                        saturations were monitored continuously. The                        Colonoscope was introduced through the anus and                        advanced to the  the cecum, identified by the                        appendiceal orifice, IC valve and transillumination.                        The colonoscopy was performed with ease. The patient                        tolerated the procedure well. The quality of the bowel        preparation was fair. Findings:      The perianal and digital rectal examinations were normal.      Non-bleeding internal hemorrhoids were found during retroflexion. The       hemorrhoids were small and Grade I (internal hemorrhoids that do not       prolapse).      A single (solitary) ten mm ulcer was found in the distal rectum. No       bleeding was present. No stigmata of recent bleeding were seen.      The exam was otherwise without abnormality on direct and retroflexion       views.      a moderate amount of semi-liquid stool was found in the entire colon ,       it was yellow in color, no blood or melena seen Impression:           - Preparation of the colon was fair.                       - Non-bleeding internal hemorrhoids.                       - A single (solitary) ulcer in the distal rectum.                       - The examination was otherwise normal on direct and                        retroflexion views.                       - Stool in the entire examined colon.                       - No specimens collected. Recommendation:       - Return patient to hospital ward for ongoing care.                       - Advance diet as tolerated.                       - Continue present medications.                       - Yellow stool in colon - no active bleeeding.                        Solitaqry rectal ulcer.                       I would suggest as an outpatientrepeat flex  sigmoidoscopy in 6 weeks to check for healing of ulcer                        which could be from any rectal tube if inserted. /If                        has further melena will need capsule study . Procedure Code(s):    --- Professional ---                       [phone], Colonoscopy, flexible; diagnostic, including                        collection of specimen(s) by brushing or washing, when                        performed (separate procedure) Diagnosis Code(s):    --- Professional  ---                       K64.0, First degree hemorrhoids                       K62.6, Ulcer of anus and rectum                       K92.1, Melena (includes Hematochezia) CPT copyright 2018 American Medical Association. All rights reserved. The codes documented in this report are preliminary and upon coder review may  be revised to meet current compliance requirements. [name], MD [name] MD, MD 06/30/2018 1:57:00 PM This report has been signed electronically. Number of Addenda: 0 Note Initiated On: 06/30/2018 1:32 PM Scope Withdrawal Time: 0 hours 5 minutes 31 seconds  Total Procedure Duration: 0 hours 8 minutes 25 seconds       [facility]

## 2018-06-30 NOTE — Care Management Note (Signed)
Case Management Note  Patient Details  Name: Edwin Woodward MRN: [id number] Date of Birth: March 28, 1951  Subjective/Objective:   Patient from home; lives Harmon Pier his wife/significant other.  He has been admitted for a GI bleed.  EGD on 10-22 and colonoscopy done today.  Patient has a discharge order in and order for home health PT.  Offered choice to Harmon Pier and she states they have used [facility] in the past and would like them again.  Referral made and excepted.  Harmon Pier states she was not prepared to pick up patient today.    She states she was told it would be Friday or Saturday.  She is busy in meetings  This evening and states she is "shocked" they are letting him discharge.  RNCM explained all tests have been completed for now and he is medically clear to discharge.  She states she will call this evening and let the nurse know what time she will be there.  Denies difficulty obtaining medications.  Current with PCP.               Action/Plan:   Expected Discharge Date:  06/30/18               Expected Discharge Plan:  [place]  In-House Referral:     Discharge planning Services  CM Consult  Post Acute Care Choice:    Choice offered to:     DME Arranged:    DME Agency:     HH Arranged:    [place] Agency:     Status of Service:  Completed, signed off  If discussed at [employer], dates discussed:    Additional Comments:  Elza Rafter, RN 06/30/2018, 4:25 PM

## 2018-06-30 NOTE — H&P (Signed)
Edwin Bellows, MD [address] [place], [place], [place], [address] [address], [place], [place], [place], [address] Phone: [phone]  Fax: [phone]  Primary Care Physician:  Edwin Mc, MD   Pre-Procedure History & Physical: HPI:  Edwin Woodward is a 67 y.o. male is here for an colonoscopy.   Past Medical History:  Diagnosis Date  . CAD (coronary artery disease)   . Cardiovascular stress test abnormal    Jan 2010 no significant ischemia  . Hyperlipidemia   . Hypertension   . Presence of bare metal stent in right coronary artery    proximal RCA adn LAD in April 2009  . Trigger finger     Past Surgical History:  Procedure Laterality Date  . APPENDECTOMY    . APPENDECTOMY    . CARPAL TUNNEL RELEASE    . CATARACT EXTRACTION     right  . CORONARY ANGIOPLASTY  12/2007  . CORONARY ARTERY BYPASS GRAFT     2 vessel  . ESOPHAGOGASTRODUODENOSCOPY (EGD) WITH PROPOFOL N/A 06/29/2018   Procedure: ESOPHAGOGASTRODUODENOSCOPY (EGD) WITH PROPOFOL;  Surgeon: Edwin Bellows, MD;  Location: [facility] ENDOSCOPY;  Service: Gastroenterology;  Laterality: N/A;  . JOINT REPLACEMENT     total knee  . REPLACEMENT TOTAL KNEE    . TONSILLECTOMY    . VASECTOMY      Prior to Admission medications   Medication Sig Start Date End Date Taking? Authorizing Provider  alfuzosin (UROXATRAL) 10 MG 24 hr tablet Take 1 tablet (10 mg total) by mouth daily. 05/23/16  Yes Edwin Mc, MD  aspirin EC 81 MG tablet Take 1 tablet (81 mg total) by mouth daily. 08/15/15  Yes Minna Merritts, MD  atorvastatin (LIPITOR) 40 MG tablet Take 1 tablet (40 mg total) by mouth daily at 6 PM. Take 40 mg alt with 20 mg daily. 01/07/18  Yes Gollan, Kathlene November, MD  buPROPion (WELLBUTRIN SR) 150 MG 12 hr tablet TAKE 1 TABLET BY MOUTH TWICE A DAY 08/13/15  Yes Clapacs, Madie Reno, MD  calcium carbonate 1250 MG capsule Take 1,250 mg by mouth 2 (two) times daily with a meal.   Yes [provider]  citalopram  (CELEXA) 20 MG tablet Take 30 mg by mouth daily. 03/20/16  Yes [provider]  clopidogrel (PLAVIX) 75 MG tablet Take 1 tablet (75 mg total) by mouth daily. 01/07/18  Yes Gollan, Kathlene November, MD  donepezil (ARICEPT) 5 MG tablet Take 5 mg by mouth 2 (two) times daily.  03/20/16  Yes [provider]  finasteride (PROSCAR) 5 MG tablet Take 5 mg by mouth daily.   Yes [provider]  magnesium oxide (MAG-OX) 400 MG tablet TAKE 1 TABLET (400 MG TOTAL) BY MOUTH 2 (TWO) TIMES DAILY AFTER A MEAL Patient taking differently: Take 400 mg by mouth 2 (two) times daily.  04/22/18  Yes Edwin Mc, MD  nitroGLYCERIN (NITROSTAT) 0.4 MG SL tablet Place 1 tablet (0.4 mg total) under the tongue every 5 (five) minutes as needed. 04/11/11  Yes Minna Merritts, MD  risperiDONE (RISPERDAL) 0.25 MG tablet Take 0.25 mg by mouth 2 (two) times daily.  09/14/15 06/26/19 Yes [provider]  clopidogrel (PLAVIX) 75 MG tablet TAKE 1 TABLET (75 MG TOTAL) BY MOUTH DAILY. Patient not taking: Reported on 06/25/2018 05/31/18   Minna Merritts, MD  olopatadine (PATANOL) 0.1 % ophthalmic solution Place 1 drop into both eyes 2 (two) times daily. Patient not taking: Reported  on 06/25/2018 06/22/17   Edwin Mc, MD  pantoprazole (PROTONIX) 40 MG tablet Take 1 tablet (40 mg total) by mouth daily. 06/30/18 08/29/18  Edwin Loll, MD    Allergies as of 06/25/2018 - Review Complete 06/25/2018  Allergen Reaction Noted  . Biaxin [clarithromycin]    . Clarithromycin  05/13/2011  . Codeine    . Penicillins    . Promethazine  10/05/2013  . Iodinated diagnostic agents Rash 03/21/2013  . Metrizamide Rash 03/21/2013    Family History  Problem Relation Age of Onset  . Hypertension Mother   . Diabetes Mother   . Thalassemia Mother   . Heart disease Father     Social History   Socioeconomic History  . Marital status: Married    Spouse name: Not on file  . Number of children: Not on file  . Years  of education: Not on file  . Highest education level: Not on file  Occupational History  . Not on file  Social Needs  . Financial resource strain: Not on file  . Food insecurity:    Worry: Not on file    Inability: Not on file  . Transportation needs:    Medical: Not on file    Non-medical: Not on file  Tobacco Use  . Smoking status: Former Research scientist (life sciences)  . Smokeless tobacco: Never Used  Substance and Sexual Activity  . Alcohol use: No    Alcohol/week: 0.0 standard drinks  . Drug use: No    Types: Marijuana  . Sexual activity: Not on file  Lifestyle  . Physical activity:    Days per week: Not on file    Minutes per session: Not on file  . Stress: Not on file  Relationships  . Social connections:    Talks on phone: Not on file    Gets together: Not on file    Attends religious service: Not on file    Active member of club or organization: Not on file    Attends meetings of clubs or organizations: Not on file    Relationship status: Not on file  . Intimate partner violence:    Fear of current or ex partner: Not on file    Emotionally abused: Not on file    Physically abused: Not on file    Forced sexual activity: Not on file  Other Topics Concern  . Not on file  Social History Narrative   Lives with wife.    Review of Systems: See HPI, otherwise negative ROS  Physical Exam: BP 129/71 (BP Location: Left Arm)   Pulse 60   Temp 98.8 F (37.1 C) (Oral)   Resp 15   Ht 5\' 3"  (1.6 m)   Wt 78.4 kg   SpO2 93%   BMI 30.63 kg/m  General:   Alert,  pleasant and cooperative in NAD Head:  Normocephalic and atraumatic. Neck:  Supple; no masses or thyromegaly. Lungs:  Clear throughout to auscultation, normal respiratory effort.    Heart:  +S1, +S2, Regular rate and rhythm, No edema. Abdomen:  Soft, nontender and nondistended. Normal bowel sounds, without guarding, and without rebound.   Neurologic:  Alert and  oriented x4;  grossly normal  neurologically.  Impression/Plan: Edwin Woodward is here for an colonoscopy to be performed for melena   Risks, benefits, limitations, and alternatives regarding  colonoscopy have been reviewed with the patient.  Questions have been answered.  All parties agreeable.   Edwin Bellows, MD  06/30/2018, 12:55 PM

## 2018-06-30 NOTE — Anesthesia Preprocedure Evaluation (Signed)
Anesthesia Evaluation  Patient identified by MRN, date of birth, ID band Patient awake    Reviewed: Allergy & Precautions, NPO status , Patient's Chart, lab work & pertinent test results  History of Anesthesia Complications Negative for: history of anesthetic complications  Airway Mallampati: II  TM Distance: >3 FB Neck ROM: Full    Dental no notable dental hx.    Pulmonary sleep apnea , neg COPD, former smoker,    breath sounds clear to auscultation- rhonchi (-) wheezing      Cardiovascular hypertension, + CAD and + Cardiac Stents  (-) CABG  Rhythm:Regular Rate:Normal - Systolic murmurs and - Diastolic murmurs    Neuro/Psych PSYCHIATRIC DISORDERS Anxiety negative neurological ROS     GI/Hepatic Neg liver ROS, GERD  ,  Endo/Other  negative endocrine ROSneg diabetes  Renal/GU negative Renal ROS     Musculoskeletal  (+) Arthritis ,   Abdominal (+) + obese,   Peds  Hematology  (+) anemia ,   Anesthesia Other Findings Past Medical History: No date: CAD (coronary artery disease) No date: Cardiovascular stress test abnormal     Comment:  Jan 2010 no significant ischemia No date: Hyperlipidemia No date: Hypertension No date: Presence of bare metal stent in right coronary artery     Comment:  proximal RCA adn LAD in April 2009 No date: Trigger finger   Reproductive/Obstetrics                             Anesthesia Physical Anesthesia Plan  ASA: III  Anesthesia Plan: General   Post-op Pain Management:    Induction: Intravenous  PONV Risk Score and Plan: 1 and Propofol infusion  Airway Management Planned: Natural Airway  Additional Equipment:   Intra-op Plan:   Post-operative Plan:   Informed Consent: I have reviewed the patients History and Physical, chart, labs and discussed the procedure including the risks, benefits and alternatives for the proposed anesthesia with the  patient or authorized representative who has indicated his/her understanding and acceptance.   Dental advisory given  Plan Discussed with: CRNA and Anesthesiologist  Anesthesia Plan Comments:         Anesthesia Quick Evaluation

## 2018-06-30 NOTE — Transfer of Care (Signed)
Immediate Anesthesia Transfer of Care Note  Patient: Edwin Woodward  Procedure(s) Performed: COLONOSCOPY WITH PROPOFOL (N/A )  Patient Location: PACU and Endoscopy Unit  Anesthesia Type:General  Level of Consciousness: drowsy  Airway & Oxygen Therapy: Patient Spontanous Breathing and Patient connected to nasal cannula oxygen  Post-op Assessment: Report given to RN and Post -op Vital signs reviewed and stable  Post vital signs: Reviewed and stable  Last Vitals:  Vitals Value Taken Time  BP    Temp    Pulse    Resp    SpO2      Last Pain:  Vitals:   06/30/18 0810  TempSrc: Oral  PainSc:       Patients Stated Pain Goal: 0 (03/29/56 5051)  Complications: No apparent anesthesia complications

## 2018-06-30 NOTE — Discharge Summary (Addendum)
[facility] [place] NAME: Edwin Woodward    MR#:  [id number]  DATE OF BIRTH:  [birth year]  DATE OF ADMISSION:  06/25/2018   ADMITTING PHYSICIAN: Edwin Mandes, MD  DATE OF DISCHARGE: 06/30/2018  PRIMARY CARE PHYSICIAN: Edwin Mc, MD   ADMISSION DIAGNOSIS:  Shock ([place]) [R57.9] Upper GI bleed [K92.2] ST elevation myocardial infarction (STEMI), unspecified artery ([place]) [I21.3] DISCHARGE DIAGNOSIS:  Active Problems:   GI bleed  SECONDARY DIAGNOSIS:   Past Medical History:  Diagnosis Date  . CAD (coronary artery disease)   . Cardiovascular stress test abnormal    Jan 2010 no significant ischemia  . Hyperlipidemia   . Hypertension   . Presence of bare metal stent in right coronary artery    proximal RCA adn LAD in April 2009  . Trigger finger    HOSPITAL COURSE:  [name] a67 y.o.malewith a known history of coronary artery disease status post stent in the past, hypertension, hyperlipidemia,dementia/cognitive decline came to the emergency room by EMS after he started having some chest pain around 9 PM last night. Patient reported some shortness of breath nausea and dizziness.  1.Acute respiratory failure likely due to an episode of vomiting with some chemical pneumonitis The patient was intubated for airway protection and put on the ventilator, extubated. Weaned off O2 [place].  Discontinued Zosyn IV, changed to Augmentin p.o. leukocytosis improved.   Respiratory failure improved.  No need augmenting on discharge.   2.  Cardiac arrest, possible due to vasovagal response secondary vomiting. No evidence of STEMI or non-STEMI per Dr. Fletcher Woodward. Stress test is normal.  Low risk for endoscopy per Dr. Fletcher Woodward.  3 Anemia due to acute blood loss secondary to G.I. Bleed received two units of positive blood.  Hemoglobin decreased to 8.2, repeat hemoglobin 9.1.  No active bleeding. Restart IV Protonix drip. EGD: small gastric  ulcers, unremarkable colonoscopy per Dr. Vicente Woodward. Change to p.o. Protonix twice daily and to follow-up with Dr. Vicente Woodward as outpatient.  4.  Hypotension. Improved with IV fluids, IV levophed andtransfusion.  5.Chest pain with history of coronary artery disease status post bare metal stent in the past -patient was on aspirin and Plavix, which were hold due to GI bleeding. No MI, normal stress test per Dr. Fletcher Woodward.  6.  Acute renal failure, possible due to hypotension.  Improved with IV fluid support.  5.  Lactic acidosis, due to above, improved.  6. History of dementia  Hypokalemia.    Improved with potassium supplement.  I discussed with Dr. Vicente Woodward. Weakness. HHPT DISCHARGE CONDITIONS:  Stable, discharge to home with HHPT today. CONSULTS OBTAINED:  Treatment Team:  Edwin Lame, MD Edwin Bellows, MD DRUG ALLERGIES:   Allergies  Allergen Reactions  . Biaxin [Clarithromycin]   . Clarithromycin   . Codeine     Sever vomiting   . Penicillins   . Promethazine   . Iodinated Diagnostic Agents Rash  . Metrizamide Rash   DISCHARGE MEDICATIONS:   Allergies as of 06/30/2018      Reactions   Biaxin [clarithromycin]    Clarithromycin    Codeine    Sever vomiting   Penicillins    Promethazine    Iodinated Diagnostic Agents Rash   Metrizamide Rash      Medication List    STOP taking these medications   aspirin EC 81 MG tablet   clopidogrel 75 MG tablet Commonly known as:  PLAVIX     TAKE these medications  alfuzosin 10 MG 24 hr tablet Commonly known as:  UROXATRAL Take 1 tablet (10 mg total) by mouth daily.   atorvastatin 40 MG tablet Commonly known as:  LIPITOR Take 1 tablet (40 mg total) by mouth daily at 6 PM. Take 40 mg alt with 20 mg daily.   buPROPion 150 MG 12 hr tablet Commonly known as:  WELLBUTRIN SR TAKE 1 TABLET BY MOUTH TWICE A DAY   calcium carbonate 1250 MG capsule Take 1,250 mg by mouth 2 (two) times daily with a meal.   citalopram 20 MG  tablet Commonly known as:  CELEXA Take 30 mg by mouth daily.   donepezil 5 MG tablet Commonly known as:  ARICEPT Take 5 mg by mouth 2 (two) times daily.   finasteride 5 MG tablet Commonly known as:  PROSCAR Take 5 mg by mouth daily.   magnesium oxide 400 MG tablet Commonly known as:  MAG-OX TAKE 1 TABLET (400 MG TOTAL) BY MOUTH 2 (TWO) TIMES DAILY AFTER A MEAL What changed:  See the new instructions.   nitroGLYCERIN 0.4 MG SL tablet Commonly known as:  NITROSTAT Place 1 tablet (0.4 mg total) under the tongue every 5 (five) minutes as needed.   olopatadine 0.1 % ophthalmic solution Commonly known as:  PATANOL Place 1 drop into both eyes 2 (two) times daily.   pantoprazole 40 MG tablet Commonly known as:  PROTONIX Take 1 tablet (40 mg total) by mouth daily.   risperiDONE 0.25 MG tablet Commonly known as:  RISPERDAL Take 0.25 mg by mouth 2 (two) times daily.        DISCHARGE INSTRUCTIONS:  See AVS.  If you experience worsening of your admission symptoms, develop shortness of breath, life threatening emergency, suicidal or homicidal thoughts you must seek medical attention immediately by calling 911 or calling your MD immediately  if symptoms less severe.  You Must read complete instructions/literature along with all the possible adverse reactions/side effects for all the Medicines you take and that have been prescribed to you. Take any new Medicines after you have completely understood and accpet all the possible adverse reactions/side effects.   Please note  You were cared for by a hospitalist during your hospital stay. If you have any questions about your discharge medications or the care you received while you were in the hospital after you are discharged, you can call the unit and asked to speak with the hospitalist on call if the hospitalist that took care of you is not available. Once you are discharged, your primary care physician will handle any further medical  issues. Please note that NO REFILLS for any discharge medications will be authorized once you are discharged, as it is imperative that you return to your primary care physician (or establish a relationship with a primary care physician if you do not have one) for your aftercare needs so that they can reassess your need for medications and monitor your lab values.    On the day of Discharge:  VITAL SIGNS:  Blood pressure 131/78, pulse 66, temperature (!) 97.3 F (36.3 C), temperature source Temporal, resp. rate 19, height 5\' 3"  (1.6 m), weight 78.4 kg, SpO2 99 %. PHYSICAL EXAMINATION:  GENERAL:  66 y.o.-year-old patient lying in the bed with no acute distress.  EYES: Pupils equal, round, reactive to light and accommodation. No scleral icterus. Extraocular muscles intact.  HEENT: Head atraumatic, normocephalic. Oropharynx and nasopharynx clear.  NECK:  Supple, no jugular venous distention. No thyroid enlargement, no tenderness.  LUNGS: Normal breath sounds bilaterally, no wheezing, rales,rhonchi or crepitation. No use of accessory muscles of respiration.  CARDIOVASCULAR: S1, S2 normal. No murmurs, rubs, or gallops.  ABDOMEN: Soft, non-tender, non-distended. Bowel sounds present. No organomegaly or mass.  EXTREMITIES: No pedal edema, cyanosis, or clubbing.  NEUROLOGIC: Cranial nerves II through XII are intact. Muscle strength 5/5 in all extremities. Sensation intact. Gait not checked.  PSYCHIATRIC: The patient is alert and oriented x 3.  SKIN: No obvious rash, lesion, or ulcer.  DATA REVIEW:   CBC Recent Labs  Lab 06/28/18 1829 06/30/18 0558  WBC 8.9  --   HGB 8.2* 9.1*  HCT 25.8*  --   PLT 150  --     Chemistries  Recent Labs  Lab 06/26/18 0406 06/27/18 1255  06/30/18 0558  NA 142  --    < > 143  K 4.6  --    < > 3.6  CL 111  --    < > 109  CO2 24  --    < > 28  GLUCOSE 126*  --    < > 96  BUN 43*  --    < > 8  CREATININE 1.39*  --    < > 0.85  CALCIUM 8.4*  --    < > 8.3*   MG 1.9 1.9  --   --   AST 23  --   --   --   ALT 20  --   --   --   ALKPHOS 53  --   --   --   BILITOT 0.8  --   --   --    < > = values in this interval not displayed.     Microbiology Results  Results for orders placed or performed during the hospital encounter of 06/25/18  MRSA PCR Screening     Status: None   Collection Time: 06/25/18  9:25 AM  Result Value Ref Range Status   MRSA by PCR NEGATIVE NEGATIVE Final    Comment:        The GeneXpert MRSA Assay (FDA approved for NASAL specimens only), is one component of a comprehensive MRSA colonization surveillance program. It is not intended to diagnose MRSA infection nor to guide or monitor treatment for MRSA infections. Performed at [facility], [address] [place], [place] [address]   Culture, respiratory (non-expectorated)     Status: None   Collection Time: 06/26/18  9:21 AM  Result Value Ref Range Status   Specimen Description   Final    TRACHEAL ASPIRATE Performed at [facility], [address] [place], [place] [address]    Special Requests   Final    NONE Performed at [facility], [address] [place], [place] [address]    Gram Stain   Final    RARE WBC PRESENT, PREDOMINANTLY PMN FEW GRAM POSITIVE COCCI IN PAIRS IN CLUSTERS RARE GRAM POSITIVE RODS    Culture   Final    MODERATE Consistent with normal respiratory flora. Performed at [facility], [place] [address] [place], [place] [address]    Report Status 06/28/2018 FINAL  Final    RADIOLOGY:  No results found.   Management plans discussed with the patient, family and they are in agreement.  CODE STATUS: Full Code   TOTAL TIME TAKING CARE OF THIS PATIENT: 35 minutes.    [name] M.D on 06/30/2018 at 2:49 PM  Between 7am to 6pm -  Pager - [phone]  After 6pm go to [email or web address] - Proofreader  [facility]  Office  [phone]  CC: Primary care physician;  Edwin Mc, MD   Note: This dictation was prepared with Dragon dictation along with smaller phrase technology. Any transcriptional errors that result from this process are unintentional.

## 2018-06-30 NOTE — Anesthesia Postprocedure Evaluation (Signed)
Anesthesia Post Note  Patient: Edwin Woodward  Procedure(s) Performed: COLONOSCOPY WITH PROPOFOL (N/A )  Patient location during evaluation: Endoscopy Anesthesia Type: General Level of consciousness: awake and alert Pain management: pain level controlled Vital Signs Assessment: post-procedure vital signs reviewed and stable Respiratory status: spontaneous breathing, nonlabored ventilation, respiratory function stable and patient connected to nasal cannula oxygen Cardiovascular status: blood pressure returned to baseline and stable Postop Assessment: no apparent nausea or vomiting Anesthetic complications: no     Last Vitals:  Vitals:   06/30/18 1420 06/30/18 1430  BP: 135/79 131/78  Pulse: 67 66  Resp: 14 19  Temp:    SpO2: 99% 99%    Last Pain:  Vitals:   06/30/18 1430  TempSrc:   PainSc: 0-No pain                 Starnisha Batrez S

## 2018-06-30 NOTE — Care Management Important Message (Signed)
Copy of signed IM left with patient in room.  

## 2018-07-01 MED ORDER — IPRATROPIUM-ALBUTEROL 0.5-2.5 (3) MG/3ML IN SOLN: 3.0000 mL | Freq: Two times a day (BID) | RESPIRATORY_TRACT | Status: DC

## 2018-07-01 MED ORDER — PANTOPRAZOLE SODIUM 40 MG PO TBEC: 40.0000 mg | DELAYED_RELEASE_TABLET | Freq: Two times a day (BID) | ORAL | 0 refills | Status: DC

## 2018-07-01 NOTE — Discharge Summary (Addendum)
[facility] [place] NAME: Edwin Woodward    MR#:  [id number]  DATE OF BIRTH:  1951-01-12  DATE OF ADMISSION:  06/25/2018   ADMITTING PHYSICIAN: Fritzi Mandes, MD  DATE OF DISCHARGE: 07/01/2018  PRIMARY CARE PHYSICIAN: [name], MD   ADMISSION DIAGNOSIS:  Shock ([place]) [R57.9] Upper GI bleed [K92.2] ST elevation myocardial infarction (STEMI), unspecified artery ([place]) [I21.3] DISCHARGE DIAGNOSIS:  Active Problems:   GI bleed  SECONDARY DIAGNOSIS:   Past Medical History:  Diagnosis Date  . CAD (coronary artery disease)   . Cardiovascular stress test abnormal    Jan 2010 no significant ischemia  . Hyperlipidemia   . Hypertension   . Presence of bare metal stent in right coronary artery    proximal RCA adn LAD in April 2009  . Trigger finger    HOSPITAL COURSE:  [name] a67 y.o.malewith a known history of coronary artery disease status post stent in the past, hypertension, hyperlipidemia,dementia/cognitive decline came to the emergency room by EMS after he started having some chest pain around 9 PM last night. Patient reported some shortness of breath nausea and dizziness.  1.Acute respiratory failure likely due to an episode of vomiting with some chemical pneumonitis The patient was intubated for airway protection and put on the ventilator, extubated. Weaned off O2 [place].  Discontinued Zosyn IV, changed to Augmentin p.o. leukocytosis improved.   Respiratory failure improved.  No need augmenting on discharge.   2.  Cardiac arrest, possible due to vasovagal response secondary vomiting. No evidence of STEMI or non-STEMI per Dr. Fletcher Anon. Stress test is normal.  Low risk for endoscopy per Dr. Fletcher Anon.  3 Anemia due to acute blood loss secondary to G.I. Bleed received two units of positive blood.  Hemoglobin decreased to 8.2, repeat hemoglobin 9.1.  No active bleeding. Restart IV Protonix drip. EGD: small gastric  ulcers, unremarkable colonoscopy per Dr. Vicente Males. Change to p.o. Protonix twice daily and to follow-up with Dr. Vicente Males as outpatient.  4.  Hypotension. Improved with IV fluids, IV levophed andtransfusion.  5.Chest pain with history of coronary artery disease status post bare metal stent in the past -patient was on aspirin and Plavix, which were hold due to GI bleeding. No MI, normal stress test per Dr. Fletcher Anon.  6.  Acute renal failure, possible due to hypotension.  Improved with IV fluid support.  5.  Lactic acidosis, due to above, improved.  6. History of dementia  Hypokalemia.    Improved with potassium supplement. Weakness. HHPT The patient's ex-wife could not pick him up last night. She agreed to discharge home today. DISCHARGE CONDITIONS:  Stable, discharge to home with HHPT today. CONSULTS OBTAINED:  Treatment Team:  [name], MD [name], MD DRUG ALLERGIES:   Allergies  Allergen Reactions  . Biaxin [Clarithromycin]   . Clarithromycin   . Codeine     Sever vomiting   . Penicillins   . Promethazine   . Iodinated Diagnostic Agents Rash  . Metrizamide Rash   DISCHARGE MEDICATIONS:   Allergies as of 07/01/2018      Reactions   Biaxin [clarithromycin]    Clarithromycin    Codeine    Sever vomiting   Penicillins    Promethazine    Iodinated Diagnostic Agents Rash   Metrizamide Rash      Medication List    STOP taking these medications   aspirin EC 81 MG tablet   clopidogrel 75 MG tablet Commonly known as:  PLAVIX     TAKE these medications   alfuzosin 10 MG 24 hr tablet Commonly known as:  UROXATRAL Take 1 tablet (10 mg total) by mouth daily.   atorvastatin 40 MG tablet Commonly known as:  LIPITOR Take 1 tablet (40 mg total) by mouth daily at 6 PM. Take 40 mg alt with 20 mg daily.   buPROPion 150 MG 12 hr tablet Commonly known as:  WELLBUTRIN SR TAKE 1 TABLET BY MOUTH TWICE A DAY   calcium carbonate 1250 MG capsule Take 1,250 mg  by mouth 2 (two) times daily with a meal.   citalopram 20 MG tablet Commonly known as:  CELEXA Take 30 mg by mouth daily.   donepezil 5 MG tablet Commonly known as:  ARICEPT Take 5 mg by mouth 2 (two) times daily.   finasteride 5 MG tablet Commonly known as:  PROSCAR Take 5 mg by mouth daily.   magnesium oxide 400 MG tablet Commonly known as:  MAG-OX TAKE 1 TABLET (400 MG TOTAL) BY MOUTH 2 (TWO) TIMES DAILY AFTER A MEAL What changed:  See the new instructions.   nitroGLYCERIN 0.4 MG SL tablet Commonly known as:  NITROSTAT Place 1 tablet (0.4 mg total) under the tongue every 5 (five) minutes as needed.   olopatadine 0.1 % ophthalmic solution Commonly known as:  PATANOL Place 1 drop into both eyes 2 (two) times daily.   pantoprazole 40 MG tablet Commonly known as:  PROTONIX Take 1 tablet (40 mg total) by mouth 2 (two) times daily before a meal.   risperiDONE 0.25 MG tablet Commonly known as:  RISPERDAL Take 0.25 mg by mouth 2 (two) times daily.        DISCHARGE INSTRUCTIONS:  See AVS.  If you experience worsening of your admission symptoms, develop shortness of breath, life threatening emergency, suicidal or homicidal thoughts you must seek medical attention immediately by calling 911 or calling your MD immediately  if symptoms less severe.  You Must read complete instructions/literature along with all the possible adverse reactions/side effects for all the Medicines you take and that have been prescribed to you. Take any new Medicines after you have completely understood and accpet all the possible adverse reactions/side effects.   Please note  You were cared for by a hospitalist during your hospital stay. If you have any questions about your discharge medications or the care you received while you were in the hospital after you are discharged, you can call the unit and asked to speak with the hospitalist on call if the hospitalist that took care of you is not  available. Once you are discharged, your primary care physician will handle any further medical issues. Please note that NO REFILLS for any discharge medications will be authorized once you are discharged, as it is imperative that you return to your primary care physician (or establish a relationship with a primary care physician if you do not have one) for your aftercare needs so that they can reassess your need for medications and monitor your lab values.    On the day of Discharge:  VITAL SIGNS:  Blood pressure (!) 147/70, pulse 67, temperature 98.4 F (36.9 C), temperature source Oral, resp. rate 18, height 5\' 3"  (1.6 m), weight 78.4 kg, SpO2 100 %. PHYSICAL EXAMINATION:  GENERAL:  67 y.o.-year-old patient lying in the bed with no acute distress.  EYES: Pupils equal, round, reactive to light and accommodation. No scleral icterus. Extraocular muscles intact.  HEENT: Head atraumatic, normocephalic. Oropharynx  and nasopharynx clear.  NECK:  Supple, no jugular venous distention. No thyroid enlargement, no tenderness.  LUNGS: Normal breath sounds bilaterally, no wheezing, rales,rhonchi or crepitation. No use of accessory muscles of respiration.  CARDIOVASCULAR: S1, S2 normal. No murmurs, rubs, or gallops.  ABDOMEN: Soft, non-tender, non-distended. Bowel sounds present. No organomegaly or mass.  EXTREMITIES: No pedal edema, cyanosis, or clubbing.  NEUROLOGIC: Cranial nerves II through XII are intact. Muscle strength 5/5 in all extremities. Sensation intact. Gait not checked.  PSYCHIATRIC: The patient is alert and oriented x 3.  SKIN: No obvious rash, lesion, or ulcer.  DATA REVIEW:   CBC Recent Labs  Lab 06/28/18 1829 06/30/18 0558  WBC 8.9  --   HGB 8.2* 9.1*  HCT 25.8*  --   PLT 150  --     Chemistries  Recent Labs  Lab 06/26/18 0406 06/27/18 1255  06/30/18 0558  NA 142  --    < > 143  K 4.6  --    < > 3.6  CL 111  --    < > 109  CO2 24  --    < > 28  GLUCOSE 126*  --    <  > 96  BUN 43*  --    < > 8  CREATININE 1.39*  --    < > 0.85  CALCIUM 8.4*  --    < > 8.3*  MG 1.9 1.9  --   --   AST 23  --   --   --   ALT 20  --   --   --   ALKPHOS 53  --   --   --   BILITOT 0.8  --   --   --    < > = values in this interval not displayed.     Microbiology Results  Results for orders placed or performed during the hospital encounter of 06/25/18  MRSA PCR Screening     Status: None   Collection Time: 06/25/18  9:25 AM  Result Value Ref Range Status   MRSA by PCR NEGATIVE NEGATIVE Final    Comment:        The GeneXpert MRSA Assay (FDA approved for NASAL specimens only), is one component of a comprehensive MRSA colonization surveillance program. It is not intended to diagnose MRSA infection nor to guide or monitor treatment for MRSA infections. Performed at [facility], [address] [place], [place] [address]   Culture, respiratory (non-expectorated)     Status: None   Collection Time: 06/26/18  9:21 AM  Result Value Ref Range Status   Specimen Description   Final    TRACHEAL ASPIRATE Performed at [facility], [address], [place], [place] [address]    Special Requests   Final    NONE Performed at [facility], [address] [place], [place] [address]    Gram Stain   Final    RARE WBC PRESENT, PREDOMINANTLY PMN FEW GRAM POSITIVE COCCI IN PAIRS IN CLUSTERS RARE GRAM POSITIVE RODS    Culture   Final    MODERATE Consistent with normal respiratory flora. Performed at [facility], [place] [address], [place], [place] [address]    Report Status 06/28/2018 FINAL  Final    RADIOLOGY:  No results found.   Management plans discussed with the patient, family and they are in agreement.  CODE STATUS: Full Code   TOTAL TIME TAKING CARE OF THIS PATIENT: 32 minutes.  [name] M.D on 07/01/2018 at 12:09 PM  Between 7am to 6pm - Pager - [phone]  After 6pm go to [email or web address] - Solicitor  [facility]  Office  [phone]  CC: Primary care physician; [name], MD   Note: This dictation was prepared with Dragon dictation along with smaller phrase technology. Any transcriptional errors that result from this process are unintentional.

## 2018-07-01 NOTE — Progress Notes (Signed)
Physical Therapy Treatment Patient Details Name: Edwin Woodward MRN: [id number] DOB: [birth year] Today's Date: 07/01/2018    History of Present Illness Pt is a 67 y/o M who presented with chest pain, SOB, nausea, dizziness.  Pt with GI bleed and is s/p transfusion.  Pt went into acute respiratory failure likely due to an episode of vomiting with chemical pneumonitis and the pt was intubated, extubated 10/20.  Pt with cardiac arrest, possibly due to vasovagal response secondary to vomiting, no evidence of STEMI or NSTEMI per cardiology.  Pt had stress test performed on 10/21 which was normal per cardiology. Pt's PMH includes dementia, CAD s/p stent placement.     PT Comments    Edwin Woodward made excellent progress with mobility, ambulating 160 ft with RW with no signs of instability.  He completed standing exercises with cues for proper technique.  Instructed pt to continue using RW with HHPT to guide him once he returns home at d/c as far as when to discontinue use.      Follow Up Recommendations  Home health PT     Equipment Recommendations  None recommended by PT    Recommendations for Other Services       Precautions / Restrictions Precautions Precautions: Fall Restrictions Weight Bearing Restrictions: No    Mobility  Bed Mobility Overal bed mobility: Independent Bed Mobility: Supine to Sit;Sit to Supine     Supine to sit: Independent Sit to supine: Independent   General bed mobility comments: Pt performs independently without assist   Transfers Overall transfer level: Needs assistance Equipment used: Rolling walker (2 wheeled);None Transfers: Sit to/from Stand Sit to Stand: Min guard         General transfer comment: Pt stood without RW, cues provided to be sure RW is within reach before standing to avoid fall due to mild instability.    Ambulation/Gait Ambulation/Gait assistance: Supervision Gait Distance (Feet): 160 Feet Assistive device: Rolling walker  (2 wheeled) Gait Pattern/deviations: Step-through pattern;Decreased step length - right;Decreased step length - left     General Gait Details: Pt's gait appears guarded and cautious but he remains steady with no signs of instability.  HR in 90s and SpO2 98% on RA when ambulating.     Stairs             Wheelchair Mobility    Modified Rankin (Stroke Patients Only)       Balance Overall balance assessment: Needs assistance Sitting-balance support: No upper extremity supported;Feet supported Sitting balance-Leahy Scale: Good     Standing balance support: No upper extremity supported;During functional activity Standing balance-Leahy Scale: Fair Standing balance comment: Pt able to stand statically without UE support but uses RW to ambulate                            Cognition Arousal/Alertness: Awake/alert Behavior During Therapy: WFL for tasks assessed/performed Overall Cognitive Status: Within Functional Limits for tasks assessed                                        Exercises General Exercises - Lower Extremity Hip Flexion/Marching: Both;10 reps;Standing Heel Raises: Strengthening;Both;10 reps;Standing Mini-Sqauts: Strengthening;Both;10 reps;Standing Other Exercises Other Exercises: Standing knee flexion x10 each LE with BUE support    General Comments        Pertinent Vitals/Pain Pain Assessment: No/denies pain  Home Living                      Prior Function            PT Goals (current goals can now be found in the care plan section) Acute Rehab PT Goals Patient Stated Goal: to go home today PT Goal Formulation: With patient Time For Goal Achievement: 07/12/18 Potential to Achieve Goals: Good Progress towards PT goals: Progressing toward goals    Frequency    Min 2X/week      PT Plan Current plan remains appropriate    Co-evaluation              AM-PAC PT "6 Clicks" Daily Activity   Outcome Measure  Difficulty turning over in bed (including adjusting bedclothes, sheets and blankets)?: A Little Difficulty moving from lying on back to sitting on the side of the bed? : A Little Difficulty sitting down on and standing up from a chair with arms (e.g., wheelchair, bedside commode, etc,.)?: A Little Help needed moving to and from a bed to chair (including a wheelchair)?: A Little Help needed walking in hospital room?: A Little Help needed climbing 3-5 steps with a railing? : A Little 6 Click Score: 18    End of Session Equipment Utilized During Treatment: Gait belt Activity Tolerance: Patient tolerated treatment well Patient left: in bed;with call bell/phone within reach;with bed alarm set Nurse Communication: Mobility status PT Visit Diagnosis: Unsteadiness on feet (R26.81);Muscle weakness (generalized) (M62.81)     Time: 4619-0122 PT Time Calculation (min) (ACUTE ONLY): 12 min  Charges:  $Therapeutic Exercise: 8-22 mins                     Collie Siad PT, DPT 07/01/2018, 12:11 PM

## 2018-07-01 NOTE — Progress Notes (Signed)
Patient escorted out of hospital via wheelchair by nursing.

## 2018-07-01 NOTE — Plan of Care (Signed)
Patient has ambulated around the nurses desk without any complications. Patient utilized walker, escorted by staff. No shortness of breath or weakness reported.

## 2018-07-01 NOTE — Progress Notes (Addendum)
Patient has ambulated around nursing station multiple times today with nursing staff and PT.  He has had breakfast and lunch with no issues.  No complaints of pain on this shift.  Patient ready for discharge.  EVA has been updated again.  She states she will come to pick up patient around 4pm.  Patient has been bathed and is dressed awaiting EVA to come get him.

## 2018-07-02 ENCOUNTER — Telehealth: Payer: Self-pay | Admitting: Internal Medicine

## 2018-07-02 NOTE — Telephone Encounter (Signed)
Transition Care Management Follow-up Telephone Call  How have you been since you were released from the hospital? Patient doing very well per wife.   Do you understand why you were in the hospital? yes   Do you understand the discharge instrcutions? yes  Items Reviewed:  Medications reviewed: yes  Allergies reviewed: yes  Dietary changes reviewed: yes  Referrals reviewed: yes   Functional Questionnaire:   Activities of Daily Living (ADLs):   He states they are independent in the following: ambulation, feeding and dressing States they require assistance with the following: bathing and hygiene   Any transportation issues/concerns?: no   Any patient concerns? no   Confirmed importance and date/time of follow-up visits scheduled: yes   Confirmed with patient if condition begins to worsen call PCP or go to the ER.  Patient was given the Call-a-Nurse line [phone]: yes

## 2018-07-05 ENCOUNTER — Encounter: Payer: Self-pay | Admitting: Internal Medicine

## 2018-07-05 ENCOUNTER — Ambulatory Visit (INDEPENDENT_AMBULATORY_CARE_PROVIDER_SITE_OTHER): Payer: Medicare HMO

## 2018-07-05 ENCOUNTER — Ambulatory Visit: Payer: Medicare HMO | Admitting: Internal Medicine

## 2018-07-05 VITALS — BP 118/82 | HR 72 | Temp 98.5°F | Resp 16 | Ht 63.0 in | Wt 175.2 lb

## 2018-07-05 DIAGNOSIS — D569 Thalassemia, unspecified: Secondary | ICD-10-CM

## 2018-07-05 DIAGNOSIS — G1220 Motor neuron disease, unspecified: Secondary | ICD-10-CM

## 2018-07-05 DIAGNOSIS — M6281 Muscle weakness (generalized): Secondary | ICD-10-CM | POA: Diagnosis not present

## 2018-07-05 DIAGNOSIS — R0781 Pleurodynia: Secondary | ICD-10-CM | POA: Diagnosis not present

## 2018-07-05 DIAGNOSIS — N183 Chronic kidney disease, stage 3 unspecified: Secondary | ICD-10-CM

## 2018-07-05 DIAGNOSIS — Z09 Encounter for follow-up examination after completed treatment for conditions other than malignant neoplasm: Secondary | ICD-10-CM

## 2018-07-05 DIAGNOSIS — I2510 Atherosclerotic heart disease of native coronary artery without angina pectoris: Secondary | ICD-10-CM

## 2018-07-05 DIAGNOSIS — D62 Acute posthemorrhagic anemia: Secondary | ICD-10-CM

## 2018-07-05 DIAGNOSIS — F0280 Dementia in other diseases classified elsewhere without behavioral disturbance: Secondary | ICD-10-CM

## 2018-07-05 DIAGNOSIS — K922 Gastrointestinal hemorrhage, unspecified: Secondary | ICD-10-CM | POA: Diagnosis not present

## 2018-07-05 DIAGNOSIS — G3109 Other frontotemporal dementia: Secondary | ICD-10-CM

## 2018-07-05 DIAGNOSIS — J189 Pneumonia, unspecified organism: Secondary | ICD-10-CM | POA: Diagnosis not present

## 2018-07-05 NOTE — Progress Notes (Signed)
Subjective:  Patient ID: Edwin Woodward, male    DOB: [birth year]  Age: 67 y.o. MRN: [id number]  CC: The primary encounter diagnosis was Generalized muscle weakness. 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  HPI Edwin Woodward presents for HOSPITAL FOLLOW UP   History Woke up feeling bad the morning of admission,  Had not  eaten dinner the night before due to vague abd symptoms (unable to characterize)    Woke up  early morning at 3 am , developed  Chest pain , felt woozy. Went to use the bathroom,  Had  a syncopal episode after micturition . Called 911 and family at 5 am .  Evidence of hematemesis before  Being taken to ER.  Recurrent epsiodes en route and in ER as well.      [facility] after having a cardiac arrest in the ER,    hypotensive shock requiring iv pressors and acute respiratory failure with aspiration pneumonia , requihring intubation for airway protection ,   and UGI bleed,    Has chronic anemia secondary to thalassemia but had   drop in hgb to 8.2 and was transfused 2 units.  .  EGD/colonoscopy done:  Non bleeding gastric ulcers. Non bleeding Rectal ulcer,  Int hemorrhoids . Blood loss attributed to [employer] tear  Noninvasive cardiac evaluation included ECHO.  EF normal , LVH , no WMA ([place])  History of BM stent to p RCA and LAD in 2009:  Asa and plavix  Were suspended  Anemia: baseline hgb 9.1  In march due to thalassemia,  FOBT was negative then,  However his baseline hgb was 12  in 2015 .  During admission hgb noted to drop  to 8.2   Was transfused 2 units.  9.1 at discharge on oct 23   No recent hematology evaluation  AKI:  Cr 0.85 at dc on oct 23 potassium 3.6    Home health was ordered ,  But not done.  He does not drive.  Current address  [address]  [place], [place] needs pt for generalized weakness .  [employer],    left cva pleurisy   On and off since leaving the hospital    Hypocalcemia:  Has been Taking 1200 mg calcium twice daily for at least 3 years     Outpatient Medications Prior to Visit  Medication Sig Dispense Refill  . alfuzosin (UROXATRAL) 10 MG 24 hr tablet Take 1 tablet (10 mg total) by mouth daily. 90 tablet 1  . Ascorbic Acid (VITAMIN C) 1000 MG tablet Take 1,000 mg by mouth daily.    Marland Kitchen atorvastatin (LIPITOR) 40 MG tablet Take 1 tablet (40 mg total) by mouth daily at 6 PM. Take 40 mg alt with 20 mg daily. 90 tablet 2  . buPROPion (WELLBUTRIN SR) 150 MG 12 hr tablet TAKE 1 TABLET BY MOUTH TWICE A DAY 60 tablet 6  . calcium carbonate 1250 MG capsule Take 1,250 mg by mouth 2 (two) times daily with a meal.    . citalopram (CELEXA) 20 MG tablet Take 30 mg by mouth daily.    Marland Kitchen  donepezil (ARICEPT) 5 MG tablet Take 5 mg by mouth 2 (two) times daily.     . finasteride (PROSCAR) 5 MG tablet Take 5 mg by mouth daily.    . magnesium oxide (MAG-OX) 400 MG tablet TAKE 1 TABLET (400 MG TOTAL) BY MOUTH 2 (TWO) TIMES DAILY AFTER A MEAL (Patient taking differently: Take 400 mg by mouth 2 (two) times daily. ) 60 tablet 3  . nitroGLYCERIN (NITROSTAT) 0.4 MG SL tablet Place 1 tablet (0.4 mg total) under the tongue every 5 (five) minutes as needed. 90 tablet 3  . olopatadine (PATANOL) 0.1 % ophthalmic solution Place 1 drop into both eyes 2 (two) times daily. 5 mL 12  . pantoprazole (PROTONIX) 40 MG tablet Take 1 tablet (40 mg total) by mouth 2 (two) times daily before a meal. 60 tablet 0  . risperiDONE (RISPERDAL) 0.25 MG tablet Take 0.25 mg by mouth 2 (two)  times daily.     Marland Kitchen zinc gluconate 50 MG tablet Take 50 mg by mouth daily.     No facility-administered medications prior to visit.     Review of Systems;  Patient denies headache, fevers, malaise, unintentional weight loss, skin rash, eye pain, sinus congestion and sinus pain, sore throat, dysphagia,  hemoptysis , cough, dyspnea, wheezing, chest pain, palpitations, orthopnea, edema, abdominal pain, nausea, melena, diarrhea, constipation, flank pain, dysuria, hematuria, urinary  Frequency, nocturia, numbness, tingling, seizures,  Focal weakness, Loss of consciousness,  Tremor, insomnia, depression, anxiety, and suicidal ideation.      Objective:  BP 118/82 (BP Location: Left Arm, Patient Position: Sitting, Cuff Size: Normal)   Pulse 72   Temp 98.5 F (36.9 C) (Oral)   Resp 16   Ht 5\' 3"  (1.6 m)   Wt 175 lb 3.2 oz (79.5 kg)   SpO2 96%   BMI 31.04 kg/m   BP Readings from Last 3 Encounters:  07/05/18 118/82  07/01/18 (!) 147/70  01/07/18 100/60    Wt Readings from Last 3 Encounters:  07/05/18 175 lb 3.2 oz (79.5 kg)  06/27/18 172 lb 14.4 oz (78.4 kg)  01/07/18 179 lb 4 oz (81.3 kg)    General appearance: alert, cooperative and appears stated age Ears: normal TM's and external ear canals both ears Throat: lips, mucosa, and tongue normal; teeth and gums normal Neck: no adenopathy, no carotid bruit, supple, symmetrical, trachea midline and thyroid not enlarged, symmetric, no tenderness/mass/nodules Back: symmetric, no curvature. ROM normal. No CVA tenderness. Lungs: clear to auscultation bilaterally Heart: regular rate and rhythm, S1, S2 normal, no murmur, click, rub or gallop Abdomen: soft, non-tender; bowel sounds normal; no masses,  no organomegaly Pulses: 2+ and symmetric Skin: Skin color, texture, turgor normal. No rashes or lesions Lymph nodes: Cervical, supraclavicular, and axillary nodes normal.  No results found for: HGBA1C  Lab Results  Component Value Date    CREATININE 0.85 06/30/2018   CREATININE 1.03 06/28/2018   CREATININE 1.39 (H) 06/26/2018    Lab Results  Component Value Date   WBC 8.9 06/28/2018   HGB 9.1 (L) 06/30/2018   HCT 25.8 (L) 06/28/2018   PLT 150 06/28/2018   GLUCOSE 96 06/30/2018   CHOL 104 11/14/2016   TRIG 75 06/25/2018   HDL 35 (L) 11/14/2016   LDLCALC 42 11/14/2016   ALT 20 06/26/2018   AST 23 06/26/2018   NA 143 06/30/2018   K 3.6 06/30/2018   CL 109 06/30/2018   CREATININE 0.85 06/30/2018   BUN 8 06/30/2018   CO2  28 06/30/2018   TSH 1.031 06/25/2018   PSA 1.37 03/31/2016   INR 1.16 06/26/2018    Dg Chest Port 1 View  Result Date: 06/26/2018 CLINICAL DATA:  Shortness of breath. EXAM: PORTABLE CHEST 1 VIEW COMPARISON:  One-view chest x-ray 06/25/2018 FINDINGS: Heart size is normal. Endotracheal tube terminates 3 cm above the carina. A left subclavian line is stable. NG tube courses off the inferior border the film. Defibrillator pads are in place. Lung volumes are improved. No focal airspace disease is present. IMPRESSION: 1. Support apparatus stable. 2. Improving lung volumes and aeration. Electronically Signed   By: San Morelle M.D.   On: 06/26/2018 07:14    Assessment & Plan:   Problem List Items Addressed This Visit    CAD, NATIVE VESSEL    History of BM stent placed in 2009,  Plavix and ASAP suspended .  Continue statin . ACE I and beta blocker were  suspended due to hypovolemic shock during admission and BP remains too soft to resume at this time.       CKD (chronic kidney disease) stage 3, GFR 30-59 ml/min (HCC)    Managed with avoidance of NSAIDs and control of BP. 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 He lives alone with close supervision by his estranged wife and her companion.        Generalized muscle weakness - Primary    Secondary to admission.  Home PT  Ordered,  Since he is unable to drive due to dementia.      Relevant Orders   Ambulatory referral to Home Health   GI bleed    No active bleeding was noted on EGD/colonoscopy but ulcerations were found,  Continue protonix bid,  Asa and plavix have been suspended. hgb  post transfusion was 9.1 at discharge and repeat is pending.         Hospital discharge follow-up    Patient is stable post discharge; all unresolved issues and questions about discharge plans were reviewed and addressed  at the visit today for hospital follow up. All labs , imaging studies and progress notes from admission were reviewed with patient today        Hypocalcemia    Etiology unclear.  May be due to histor yof low magnesium.  He has been taking 2300 mg calcium supplements daily since his admssion in 2016 for ciritically low mg of 0.2  per wife.   DEXA in 2010 noted osteopenia with T score of -2.2  Ionized calcium is pending.        Relevant Orders   Comprehensive metabolic panel   Calcium, ionized (Completed)   Hypomagnesemia    History of critically low mg in 2016 requiring admission. Etiology was unclear.  Periodic checks show that this condition has resolved with proper nutrition and supplementation.   Most recent level was 1.9  In oct 2019      Pleuritic chest pain    He has had left sided thoracic back pain . Pleuritic in nature  intermittently    Since discharge.  Imaging studies reviewed,  No history of infiltrate.   repeat chest x ray today was normal and his exam and vital signs are normal.  Etiology likely strain of intercostal muscles  Relevant Orders   DG Chest 2 View (Completed)   Thalassemia    He has not had a hematology evaluation since his relocation from [place] to [place] over 8 years ago.  Given his noted drop from 12 to 9 earlier in the year,  Aggravated by  acute blood loss from GI bleed, and need to optimize hgb  given his known CAD, her needs to be under care of a hematologist   Referral made, repeat hgb is pending   Lab Results  Component Value Date   WBC 8.9 06/28/2018   HGB 9.1 (L) 06/30/2018   HCT 25.8 (L) 06/28/2018   MCV 73.5 (L) 06/28/2018   PLT 150 06/28/2018         Relevant Orders   CBC with Differential/Platelet   Ambulatory referral to Hematology    Other Visit Diagnoses    Acute on chronic blood loss anemia       Relevant Orders   Ambulatory referral to Hematology      I am having Ashley Akin maintain his nitroGLYCERIN, calcium carbonate, buPROPion, risperiDONE, citalopram, donepezil, alfuzosin, olopatadine, atorvastatin, magnesium oxide, finasteride, pantoprazole, vitamin C, and zinc gluconate.  No orders of the defined types were placed in this encounter.   There are no discontinued medications.  Follow-up: Return in about 6 months (around 01/04/2019).   [name], MD

## 2018-07-05 NOTE — Patient Instructions (Addendum)
Home Health has been ordered for physical  therapy    I am repeating your chest x ray, your hemoglobin and  and your calcium levels

## 2018-07-06 DIAGNOSIS — M6281 Muscle weakness (generalized): Secondary | ICD-10-CM | POA: Insufficient documentation

## 2018-07-06 DIAGNOSIS — Z09 Encounter for follow-up examination after completed treatment for conditions other than malignant neoplasm: Secondary | ICD-10-CM | POA: Insufficient documentation

## 2018-07-06 DIAGNOSIS — R0781 Pleurodynia: Secondary | ICD-10-CM | POA: Insufficient documentation

## 2018-07-06 LAB — CBC WITH DIFFERENTIAL/PLATELET
BASOS ABS: 0.1 10*3/uL (ref 0.0–0.1)
Basophils Relative: 0.9 % (ref 0.0–3.0)
Eosinophils Absolute: 0.2 10*3/uL (ref 0.0–0.7)
Eosinophils Relative: 1.8 % (ref 0.0–5.0)
HCT: 30.9 % — ABNORMAL LOW (ref 39.0–52.0)
Hemoglobin: 10.3 g/dL — ABNORMAL LOW (ref 13.0–17.0)
LYMPHS ABS: 1.7 10*3/uL (ref 0.7–4.0)
Lymphocytes Relative: 19.2 % (ref 12.0–46.0)
MCHC: 33.2 g/dL (ref 30.0–36.0)
MCV: 71 fl — ABNORMAL LOW (ref 78.0–100.0)
MONO ABS: 0.8 10*3/uL (ref 0.1–1.0)
MONOS PCT: 8.8 % (ref 3.0–12.0)
NEUTROS PCT: 69.3 % (ref 43.0–77.0)
Neutro Abs: 6 10*3/uL (ref 1.4–7.7)
Platelets: 377 10*3/uL (ref 150.0–400.0)
RBC: 4.35 Mil/uL (ref 4.22–5.81)
RDW: 22.4 % — ABNORMAL HIGH (ref 11.5–15.5)
WBC: 8.7 10*3/uL (ref 4.0–10.5)

## 2018-07-06 LAB — COMPREHENSIVE METABOLIC PANEL
ALBUMIN: 3.9 g/dL (ref 3.5–5.2)
ALK PHOS: 65 U/L (ref 39–117)
ALT: 46 U/L (ref 0–53)
AST: 32 U/L (ref 0–37)
BUN: 18 mg/dL (ref 6–23)
CO2: 23 mEq/L (ref 19–32)
Calcium: 9.1 mg/dL (ref 8.4–10.5)
Chloride: 106 mEq/L (ref 96–112)
Creatinine, Ser: 1.04 mg/dL (ref 0.40–1.50)
GFR: 75.46 mL/min (ref >60.00)
Glucose, Bld: 83 mg/dL (ref 70–99)
POTASSIUM: 3.7 meq/L (ref 3.5–5.1)
SODIUM: 139 meq/L (ref 135–145)
TOTAL PROTEIN: 6.6 g/dL (ref 6.0–8.3)
Total Bilirubin: 0.6 mg/dL (ref 0.2–1.2)

## 2018-07-06 LAB — CALCIUM, IONIZED: CALCIUM ION: 4.86 mg/dL (ref 4.8–5.6)

## 2018-07-06 NOTE — Assessment & Plan Note (Signed)
Patient is stable post discharge; all unresolved issues and questions about discharge plans were reviewed and addressed  at the visit today for hospital follow up. All labs , imaging studies and progress notes from admission were reviewed with patient today

## 2018-07-06 NOTE — Assessment & Plan Note (Addendum)
History of critically low mg in 2016 requiring admission. Etiology was unclear.  Periodic checks show that this condition has resolved with proper nutrition and supplementation.   Most recent level was 1.9  In oct 2019

## 2018-07-06 NOTE — Assessment & Plan Note (Addendum)
History of BM stent placed in 2009,  Plavix and ASAP suspended .  Continue statin . ACE I and beta blocker were  suspended due to hypovolemic shock during admission and BP remains too soft to resume at this time.

## 2018-07-06 NOTE — Assessment & Plan Note (Signed)
Secondary to admission.  Home PT  Ordered,  Since he is unable to drive due to dementia.

## 2018-07-06 NOTE — Assessment & Plan Note (Signed)
No active bleeding was noted on EGD/colonoscopy but ulcerations were found,  Continue protonix bid,  Asa and plavix have been suspended. hgb  post transfusion was 9.1 at discharge and repeat is pending.

## 2018-07-06 NOTE — Assessment & Plan Note (Signed)
Mild, with good insight to his current medical condition. He lives alone with close supervision by his estranged wife and her companion.

## 2018-07-06 NOTE — Assessment & Plan Note (Signed)
He has not had a hematology evaluation since his relocation from [place] to [place] over 8 years ago.  Given his noted drop from 12 to 9 earlier in the year,  Aggravated by  acute blood loss from GI bleed, and need to optimize hgb given his known CAD, her needs to be under care of a hematologist   Referral made, repeat hgb is pending   Lab Results  Component Value Date   WBC 8.9 06/28/2018   HGB 9.1 (L) 06/30/2018   HCT 25.8 (L) 06/28/2018   MCV 73.5 (L) 06/28/2018   PLT 150 06/28/2018

## 2018-07-06 NOTE — Assessment & Plan Note (Addendum)
Etiology unclear.  May be due to histor yof low magnesium.  He has been taking 2300 mg calcium supplements daily since his admssion in 2016 for ciritically low mg of 0.2  per wife.   DEXA in 2010 noted osteopenia with T score of -2.2  Ionized calcium is pending.

## 2018-07-06 NOTE — Assessment & Plan Note (Addendum)
He has had left sided thoracic back pain . Pleuritic in nature  intermittently    Since discharge.  Imaging studies reviewed,  No history of infiltrate.   repeat chest x ray today was normal and his exam and vital signs are normal.  Etiology likely strain of intercostal muscles

## 2018-07-06 NOTE — Assessment & Plan Note (Signed)
Managed with avoidance of NSAIDs and control of BP. Cr was normal at discharge  Lab Results  Component Value Date   CREATININE 0.85 06/30/2018

## 2018-07-08 ENCOUNTER — Other Ambulatory Visit: Payer: Self-pay | Admitting: *Deleted

## 2018-07-08 ENCOUNTER — Telehealth: Payer: Self-pay | Admitting: Internal Medicine

## 2018-07-08 DIAGNOSIS — M6281 Muscle weakness (generalized): Secondary | ICD-10-CM | POA: Diagnosis not present

## 2018-07-08 DIAGNOSIS — G3109 Other frontotemporal dementia: Secondary | ICD-10-CM | POA: Diagnosis not present

## 2018-07-08 DIAGNOSIS — I2510 Atherosclerotic heart disease of native coronary artery without angina pectoris: Secondary | ICD-10-CM | POA: Diagnosis not present

## 2018-07-08 DIAGNOSIS — D569 Thalassemia, unspecified: Secondary | ICD-10-CM | POA: Diagnosis not present

## 2018-07-08 DIAGNOSIS — N183 Chronic kidney disease, stage 3 (moderate): Secondary | ICD-10-CM | POA: Diagnosis not present

## 2018-07-08 DIAGNOSIS — G1220 Motor neuron disease, unspecified: Secondary | ICD-10-CM | POA: Diagnosis not present

## 2018-07-08 DIAGNOSIS — D500 Iron deficiency anemia secondary to blood loss (chronic): Secondary | ICD-10-CM | POA: Diagnosis not present

## 2018-07-08 DIAGNOSIS — R69 Illness, unspecified: Secondary | ICD-10-CM | POA: Diagnosis not present

## 2018-07-08 NOTE — Telephone Encounter (Signed)
Verbals given for PT 2x a week for 2 weeks

## 2018-07-08 NOTE — Telephone Encounter (Unsigned)
Copied from [place] [id number]. Topic: Quick Communication - Home Health Verbal Orders >> Jul 08, 2018  2:53 PM Yvette Rack wrote: Caller/Agency: Dwyane Luo with [facility] Number: [phone] Requesting OT/PT/Skilled Nursing/Social Work: Physical Therapy Frequency: 2 times a week for 2 weeks

## 2018-07-08 NOTE — Patient Outreach (Signed)
  [facility]) Care Management  07/08/2018  KOBY PICKUP [birth year] [id number]   EMMI-general discharge RED ON EMMI ALERT Day # 4 Date: 07/07/18 1754  Red Alert Reason: transportation to follow up? No   Outreach attempt # 1 No answer. [facility] RN CM left HIPAA compliant voicemail message along with CM's contact info.   Plan: [facility] RN CM sent an unsuccessful outreach letter and scheduled this patient for another call attempt within 4 business days  Kenshin Splawn L. Lavina Hamman, RN, BSN, [place] Coordinator Office number [phone] Mobile number [phone]  Main [facility] number [phone] Fax number [phone]

## 2018-07-08 NOTE — Patient Outreach (Signed)
Patient triggered Red on [employer] Dashboard, notification sent to: Jackelyn Poling, RN

## 2018-07-08 NOTE — Patient Outreach (Signed)
[facility]) Care Management  07/08/2018  Edwin Woodward 12-Apr-1951 [id number]   EMMI-general discharge RED ON EMMI ALERT Day # 4 Date: 07/07/18 1754  Red Alert Reason: transportation to follow up? No   Patient's wife Harmon Pier returned a call to [place]  HIPAA was verified and detailed information was discussed about the EMMI call and response The referral was addressed  Edwin Woodward confirms that Edwin Woodward has hearing impairment and dementia, she was the person answering the questions for him for EMMI and he does have transportation to medical appointments  She reports the questionnaire "went wonky on me" The questions were difficult to understanding, and hard to answer CM returned a call and thanked her for the detailed information provided CM discussed the outreach letter sent and encouraged a return call to CM if other services are needed Social: Edwin Woodward confirms she is the primary care taker for Edwin Woodward He needs generally assistance with all his care  He does have a living will  Conditions:HTN, CAD, OSA on CPAP, allergies, GERD, GI bleed, gout of foot, ckd, HDL, obesity, GAD, tremor, bradycardia, frontotemporal dementia with motor neuron disease Medications: She denies concerns with medications, appointments, advance directives and assistance from [facility]    Consent: [facility] RN CM reviewed [facility] services with patient. Patient gave verbal consent for services.  Plan: [facility] RN CM will close case at this time as patient has been assessed and no needs identified.    Pt encouraged to return a call to [facility] RN CM prn  Jisella Ashenfelter L. Lavina Hamman, RN, BSN, [place] Coordinator Office number [phone] Mobile number [phone]  Main [facility] number [phone] Fax number [phone]

## 2018-07-09 ENCOUNTER — Ambulatory Visit: Payer: Self-pay | Admitting: *Deleted

## 2018-07-11 NOTE — Progress Notes (Deleted)
[place]  [phone] [phone]) [phone]  ID: Edwin Woodward OB: May 12, 1951  MR#: [id number]  WEX#:[id number]  Patient Care Team: [name], MD as PCP - General (Internal Medicine) Minna Merritts, MD as PCP - Cardiology (Cardiology)  CHIEF COMPLAINT: Thalassemia.  INTERVAL HISTORY: ***  REVIEW OF SYSTEMS:   ROS  As per HPI. Otherwise, a complete review of systems is negative.  PAST MEDICAL HISTORY: Past Medical History:  Diagnosis Date  . CAD (coronary artery disease)   . Cardiovascular stress test abnormal    Jan 2010 no significant ischemia  . Hyperlipidemia   . Hypertension   . Presence of bare metal stent in right coronary artery    proximal RCA adn LAD in April 2009  . Trigger finger     PAST SURGICAL HISTORY: Past Surgical History:  Procedure Laterality Date  . APPENDECTOMY    . APPENDECTOMY    . CARPAL TUNNEL RELEASE    . CATARACT EXTRACTION     right  . COLONOSCOPY WITH PROPOFOL N/A 06/30/2018   Procedure: COLONOSCOPY WITH PROPOFOL;  Surgeon: [name], MD;  Location: [facility] ENDOSCOPY;  Service: Gastroenterology;  Laterality: N/A;  . CORONARY ANGIOPLASTY  12/2007  . CORONARY ARTERY BYPASS GRAFT     2 vessel  . ESOPHAGOGASTRODUODENOSCOPY (EGD) WITH PROPOFOL N/A 06/29/2018   Procedure: ESOPHAGOGASTRODUODENOSCOPY (EGD) WITH PROPOFOL;  Surgeon: [name], MD;  Location: [facility] ENDOSCOPY;  Service: Gastroenterology;  Laterality: N/A;  . JOINT REPLACEMENT     total knee  . REPLACEMENT TOTAL KNEE    . TONSILLECTOMY    . VASECTOMY      FAMILY HISTORY: Family History  Problem Relation Age of Onset  . Hypertension Mother   . Diabetes Mother   . Thalassemia Mother   . Heart disease Father     ADVANCED DIRECTIVES (Y/N):  N  HEALTH MAINTENANCE: Social History   Tobacco Use  . Smoking status: Former Research scientist (life sciences)  . Smokeless tobacco: Never Used  Substance Use Topics  . Alcohol use: No    Alcohol/week: 0.0  standard drinks  . Drug use: No    Types: Marijuana     Colonoscopy:  PAP:  Bone density:  Lipid panel:  Allergies  Allergen Reactions  . Biaxin [Clarithromycin]   . Clarithromycin   . Codeine     Sever vomiting   . Penicillins   . Promethazine   . Iodinated Diagnostic Agents Rash  . Metrizamide Rash    Current Outpatient Medications  Medication Sig Dispense Refill  . alfuzosin (UROXATRAL) 10 MG 24 hr tablet Take 1 tablet (10 mg total) by mouth daily. 90 tablet 1  . Ascorbic Acid (VITAMIN C) 1000 MG tablet Take 1,000 mg by mouth daily.    Marland Kitchen atorvastatin (LIPITOR) 40 MG tablet Take 1 tablet (40 mg total) by mouth daily at 6 PM. Take 40 mg alt with 20 mg daily. 90 tablet 2  . buPROPion (WELLBUTRIN SR) 150 MG 12 hr tablet TAKE 1 TABLET BY MOUTH TWICE A DAY 60 tablet 6  . calcium carbonate 1250 MG capsule Take 1,250 mg by mouth 2 (two) times daily with a meal.    . citalopram (CELEXA) 20 MG tablet Take 30 mg by mouth daily.    Marland Kitchen donepezil (ARICEPT) 5 MG tablet Take 5 mg by mouth 2 (two) times daily.     . finasteride (PROSCAR) 5 MG tablet Take 5 mg by mouth daily.    . magnesium oxide (MAG-OX) 400 MG  tablet TAKE 1 TABLET (400 MG TOTAL) BY MOUTH 2 (TWO) TIMES DAILY AFTER A MEAL (Patient taking differently: Take 400 mg by mouth 2 (two) times daily. ) 60 tablet 3  . nitroGLYCERIN (NITROSTAT) 0.4 MG SL tablet Place 1 tablet (0.4 mg total) under the tongue every 5 (five) minutes as needed. 90 tablet 3  . olopatadine (PATANOL) 0.1 % ophthalmic solution Place 1 drop into both eyes 2 (two) times daily. 5 mL 12  . pantoprazole (PROTONIX) 40 MG tablet Take 1 tablet (40 mg total) by mouth 2 (two) times daily before a meal. 60 tablet 0  . risperiDONE (RISPERDAL) 0.25 MG tablet Take 0.25 mg by mouth 2 (two) times daily.     Marland Kitchen zinc gluconate 50 MG tablet Take 50 mg by mouth daily.     No current facility-administered medications for this visit.     OBJECTIVE: There were no vitals filed for  this visit.   There is no height or weight on file to calculate BMI.    ECOG FS:{CHL ONC [id number]  General: Well-developed, well-nourished, no acute distress. Eyes: Pink conjunctiva, anicteric sclera. HEENT: Normocephalic, moist mucous membranes, clear oropharnyx. Lungs: Clear to auscultation bilaterally. Heart: Regular rate and rhythm. No rubs, murmurs, or gallops. Abdomen: Soft, nontender, nondistended. No organomegaly noted, normoactive bowel sounds. Musculoskeletal: No edema, cyanosis, or clubbing. Neuro: Alert, answering all questions appropriately. Cranial nerves grossly intact. Skin: No rashes or petechiae noted. Psych: Normal affect. Lymphatics: No cervical, calvicular, axillary or inguinal LAD.   LAB RESULTS:  Lab Results  Component Value Date   NA 139 07/05/2018   K 3.7 07/05/2018   CL 106 07/05/2018   CO2 23 07/05/2018   GLUCOSE 83 07/05/2018   BUN 18 07/05/2018   CREATININE 1.04 07/05/2018   CALCIUM 9.1 07/05/2018   PROT 6.6 07/05/2018   ALBUMIN 3.9 07/05/2018   AST 32 07/05/2018   ALT 46 07/05/2018   ALKPHOS 65 07/05/2018   BILITOT 0.6 07/05/2018   GFRNONAA >60 06/30/2018   GFRAA >60 06/30/2018    Lab Results  Component Value Date   WBC 8.7 07/05/2018   NEUTROABS 6.0 07/05/2018   HGB 10.3 (L) 07/05/2018   HCT 30.9 (L) 07/05/2018   MCV 71.0 (L) 07/05/2018   PLT 377.0 07/05/2018     STUDIES: Dg Chest 2 View  Result Date: 07/06/2018 CLINICAL DATA:  Pneumonia. EXAM: CHEST - 2 VIEW COMPARISON:  06/26/2018. FINDINGS: Interim extubation, removal of NG tube, and removal of central line. Mediastinum hilar structures normal. Heart size normal. Lungs are clear. No pleural effusion or pneumothorax. IMPRESSION: 1. Interim extubation, removal of NG tube, and removal of central line. No pneumothorax. 2.  No acute cardiopulmonary disease. Electronically Signed   By: Marcello Moores  Register   On: 07/06/2018 08:20   Ct Head Wo Contrast  Result Date:  06/25/2018 CLINICAL DATA:  Encephalopathy, cardiac arrest EXAM: CT HEAD WITHOUT CONTRAST TECHNIQUE: Contiguous axial images were obtained from the base of the skull through the vertex without intravenous contrast. COMPARISON:  02/09/2017 FINDINGS: Brain: No evidence of acute infarction, hemorrhage, extra-axial collection, ventriculomegaly, or mass effect. Small left choroidal fissure cyst. Generalized cerebral atrophy. Periventricular white matter low attenuation likely secondary to microangiopathy. Vascular: Cerebrovascular atherosclerotic calcifications are noted. Skull: Negative for fracture or focal lesion. Sinuses/Orbits: Visualized portions of the orbits are unremarkable. Visualized portions of the paranasal sinuses are unremarkable. Visualized portions of the mastoid air cells are unremarkable. Other: None. IMPRESSION: No acute intracranial pathology. Electronically Signed  By: Kathreen Devoid   On: 06/25/2018 13:54   Nm Myocar Multi W/spect W/wall Motion / Ef  Result Date: 06/28/2018  There was no ST segment deviation noted during stress.  No T wave inversion was noted during stress.  The study is normal.  This is a low risk study.  The left ventricular ejection fraction is normal (55-65%).    Dg Chest Port 1 View  Result Date: 06/26/2018 CLINICAL DATA:  Shortness of breath. EXAM: PORTABLE CHEST 1 VIEW COMPARISON:  One-view chest x-ray 06/25/2018 FINDINGS: Heart size is normal. Endotracheal tube terminates 3 cm above the carina. A left subclavian line is stable. NG tube courses off the inferior border the film. Defibrillator pads are in place. Lung volumes are improved. No focal airspace disease is present. IMPRESSION: 1. Support apparatus stable. 2. Improving lung volumes and aeration. Electronically Signed   By: San Morelle M.D.   On: 06/26/2018 07:14   Dg Chest Port 1 View  Result Date: 06/25/2018 CLINICAL DATA:  Central line placement EXAM: PORTABLE CHEST 1 VIEW COMPARISON:   06/25/2018 FINDINGS: Left subclavian central line tip SVC RA junction level. Endotracheal tube remains 3.4 cm above the carina. NG tube within the stomach. Defibrillator pads overlie the chest. Low lung volumes persist with scattered atelectasis. No focal collapse or consolidation. No large effusion or pneumothorax. IMPRESSION: Left subclavian central line tip SVC RA junction level. Lower lung volumes with increased atelectasis Other support apparatus stable. Electronically Signed   By: Jerilynn Mages.  Shick M.D.   On: 06/25/2018 10:27   Dg Chest Portable 1 View  Result Date: 06/25/2018 CLINICAL DATA:  Intubation. EXAM: PORTABLE CHEST 1 VIEW COMPARISON:  06/25/2018. FINDINGS: Endotracheal tube noted with tip 3 cm above the carina. NG tube noted with tip below left hemidiaphragm. Heart size normal. Mild bibasilar atelectasis/infiltrates. Tiny left pleural effusion. No pneumothorax. No acute bony abnormality. IMPRESSION: 1. Endotracheal tube noted with tip 3 cm above the carina. NG tube noted with tip below left hemidiaphragm. 2. Mild bibasilar atelectasis/infiltrates. Tiny left pleural effusion. Electronically Signed   By: Marcello Moores  Register   On: 06/25/2018 08:05   Dg Chest Port 1 View  Result Date: 06/25/2018 CLINICAL DATA:  Chest pain for several hours EXAM: PORTABLE CHEST 1 VIEW COMPARISON:  12/04/2017 FINDINGS: The heart size and mediastinal contours are within normal limits. Both lungs are clear. The visualized skeletal structures are unremarkable. IMPRESSION: No active disease. Electronically Signed   By: Inez Catalina M.D.   On: 06/25/2018 07:24   Ct Angio Chest/abd/pel For Dissection W And/or W/wo  Result Date: 06/25/2018 CLINICAL DATA:  Chest pain, shortness of breath, nausea, dizziness and diaphoresis. Now intubated EXAM: CT ANGIOGRAPHY CHEST, ABDOMEN AND PELVIS TECHNIQUE: Multidetector CT imaging through the chest, abdomen and pelvis was performed using the standard protocol during bolus administration of  intravenous contrast. Multiplanar reconstructed images and MIPs were obtained and reviewed to evaluate the vascular anatomy. CONTRAST:  181mL ISOVUE-370 IOPAMIDOL (ISOVUE-370) INJECTION 76% COMPARISON:  Chest x-ray today. FINDINGS: CTA CHEST FINDINGS Cardiovascular: The thoracic aorta is normal in caliber and well opacified. No evidence of aneurysmal disease or aortic dissection. No significant atherosclerosis identified of the aorta or visualized proximal great vessels. The heart size is normal. No pericardial fluid identified. Central pulmonary arteries are of normal caliber. Calcified plaque versus stent present in the distribution of the LAD. There likely also is plaque versus stent in the distribution of the RCA. Mediastinum/Nodes: Endotracheal tube present. No enlarged mediastinal, hilar, or axillary  lymph nodes. Thyroid gland, trachea, and esophagus demonstrate no significant findings. Lungs/Pleura: Posterior bibasilar opacities present, right greater than left most likely representing atelectasis. A component of possible aspiration is not excluded. There is no evidence of airspace edema, pneumothorax, nodule or pleural fluid. Musculoskeletal: No chest wall abnormality. No acute or significant osseous findings. Review of the MIP images confirms the above findings. CTA ABDOMEN AND PELVIS FINDINGS VASCULAR Aorta: The abdominal aorta is normally patent and demonstrates mild atherosclerotic plaque. No evidence of aneurysmal disease or dissection. Celiac: Normally patent.  Normal distal branch vessel anatomy. SMA: Normally patent. Renals: Bilateral single renal arteries demonstrate normal patency. IMA: Normally patent. Inflow: Bilateral iliac arteries demonstrate normal patency. Common femoral arteries and femoral bifurcations are normally patent. Review of the MIP images confirms the above findings. NON-VASCULAR Hepatobiliary: No focal liver abnormality is seen. No gallstones, gallbladder wall thickening, or  biliary dilatation. Pancreas: Unremarkable. No pancreatic ductal dilatation or surrounding inflammatory changes. Spleen: Normal in size without focal abnormality. Adrenals/Urinary Tract: Adrenal glands are unremarkable. Kidneys are normal, without renal calculi, focal lesion, or hydronephrosis. Bladder is unremarkable. Stomach/Bowel: Gastric decompression tube extends into the stomach. Bowel shows no evidence of obstruction, ileus or perforation. No inflammation identified. There is moderate stool in the rectum. Lymphatic: No enlarged lymph nodes identified. Reproductive: Prostate is unremarkable. Other: A right inguinal hernia contains fat. No ascites or abscess identified. Musculoskeletal: The lumbar spine demonstrates degenerative disc disease. No bony fractures or lesions identified. Review of the MIP images confirms the above findings. IMPRESSION: 1. No acute aortic pathology identified in the chest or abdomen. No vascular abnormalities identified. 2. Bibasilar opacities, right greater than left may represent atelectasis. Component of aspiration cannot be excluded, especially at the right lung base. 3. Coronary atherosclerosis with evidence of calcified plaque and/or stents in the LAD and RCA. 4. Right inguinal hernia containing fat. Electronically Signed   By: Aletta Edouard M.D.   On: 06/25/2018 08:42    ASSESSMENT: Thalassemia  PLAN:    1. Thalassemia:  Patient expressed understanding and was in agreement with this plan. He also understands that He can call clinic at any time with any questions, concerns, or complaints.   Cancer Staging No matching staging information was found for the patient.  Edwin Huger, MD   07/11/2018 10:04 PM

## 2018-07-12 DIAGNOSIS — G1220 Motor neuron disease, unspecified: Secondary | ICD-10-CM | POA: Diagnosis not present

## 2018-07-12 DIAGNOSIS — N183 Chronic kidney disease, stage 3 (moderate): Secondary | ICD-10-CM | POA: Diagnosis not present

## 2018-07-12 DIAGNOSIS — G3109 Other frontotemporal dementia: Secondary | ICD-10-CM | POA: Diagnosis not present

## 2018-07-12 DIAGNOSIS — R69 Illness, unspecified: Secondary | ICD-10-CM | POA: Diagnosis not present

## 2018-07-12 DIAGNOSIS — D569 Thalassemia, unspecified: Secondary | ICD-10-CM | POA: Diagnosis not present

## 2018-07-12 DIAGNOSIS — I2510 Atherosclerotic heart disease of native coronary artery without angina pectoris: Secondary | ICD-10-CM | POA: Diagnosis not present

## 2018-07-12 DIAGNOSIS — D500 Iron deficiency anemia secondary to blood loss (chronic): Secondary | ICD-10-CM | POA: Diagnosis not present

## 2018-07-12 DIAGNOSIS — M6281 Muscle weakness (generalized): Secondary | ICD-10-CM | POA: Diagnosis not present

## 2018-07-13 ENCOUNTER — Inpatient Hospital Stay: Payer: Medicare HMO | Admitting: Oncology

## 2018-07-14 DIAGNOSIS — G1220 Motor neuron disease, unspecified: Secondary | ICD-10-CM | POA: Diagnosis not present

## 2018-07-14 DIAGNOSIS — I2510 Atherosclerotic heart disease of native coronary artery without angina pectoris: Secondary | ICD-10-CM | POA: Diagnosis not present

## 2018-07-14 DIAGNOSIS — D500 Iron deficiency anemia secondary to blood loss (chronic): Secondary | ICD-10-CM | POA: Diagnosis not present

## 2018-07-14 DIAGNOSIS — N183 Chronic kidney disease, stage 3 (moderate): Secondary | ICD-10-CM | POA: Diagnosis not present

## 2018-07-14 DIAGNOSIS — G3109 Other frontotemporal dementia: Secondary | ICD-10-CM | POA: Diagnosis not present

## 2018-07-14 DIAGNOSIS — R69 Illness, unspecified: Secondary | ICD-10-CM | POA: Diagnosis not present

## 2018-07-14 DIAGNOSIS — D569 Thalassemia, unspecified: Secondary | ICD-10-CM | POA: Diagnosis not present

## 2018-07-14 DIAGNOSIS — M6281 Muscle weakness (generalized): Secondary | ICD-10-CM | POA: Diagnosis not present

## 2018-07-15 ENCOUNTER — Telehealth: Payer: Self-pay | Admitting: Gastroenterology

## 2018-07-15 NOTE — Telephone Encounter (Signed)
Pt wife left vm regarding  Pt rx he was given rx Protonix  The Generic version  For 60 mg 2 times a day he will be running out on Nov.23rd also her insurance is not covering it at 2 a day please call pt wife she needs to get a refill and discuss this

## 2018-07-16 DIAGNOSIS — G3109 Other frontotemporal dementia: Secondary | ICD-10-CM | POA: Diagnosis not present

## 2018-07-16 DIAGNOSIS — R69 Illness, unspecified: Secondary | ICD-10-CM | POA: Diagnosis not present

## 2018-07-16 DIAGNOSIS — F0280 Dementia in other diseases classified elsewhere without behavioral disturbance: Secondary | ICD-10-CM

## 2018-07-16 DIAGNOSIS — I2510 Atherosclerotic heart disease of native coronary artery without angina pectoris: Secondary | ICD-10-CM

## 2018-07-16 DIAGNOSIS — G1220 Motor neuron disease, unspecified: Secondary | ICD-10-CM | POA: Diagnosis not present

## 2018-07-16 DIAGNOSIS — D569 Thalassemia, unspecified: Secondary | ICD-10-CM

## 2018-07-16 DIAGNOSIS — M6281 Muscle weakness (generalized): Secondary | ICD-10-CM

## 2018-07-16 DIAGNOSIS — N183 Chronic kidney disease, stage 3 (moderate): Secondary | ICD-10-CM

## 2018-07-16 DIAGNOSIS — D500 Iron deficiency anemia secondary to blood loss (chronic): Secondary | ICD-10-CM

## 2018-07-21 DIAGNOSIS — R69 Illness, unspecified: Secondary | ICD-10-CM | POA: Diagnosis not present

## 2018-07-21 DIAGNOSIS — G1220 Motor neuron disease, unspecified: Secondary | ICD-10-CM | POA: Diagnosis not present

## 2018-07-21 DIAGNOSIS — N183 Chronic kidney disease, stage 3 (moderate): Secondary | ICD-10-CM | POA: Diagnosis not present

## 2018-07-21 DIAGNOSIS — G3109 Other frontotemporal dementia: Secondary | ICD-10-CM | POA: Diagnosis not present

## 2018-07-21 DIAGNOSIS — D569 Thalassemia, unspecified: Secondary | ICD-10-CM | POA: Diagnosis not present

## 2018-07-21 DIAGNOSIS — M6281 Muscle weakness (generalized): Secondary | ICD-10-CM | POA: Diagnosis not present

## 2018-07-21 DIAGNOSIS — D500 Iron deficiency anemia secondary to blood loss (chronic): Secondary | ICD-10-CM | POA: Diagnosis not present

## 2018-07-21 DIAGNOSIS — I2510 Atherosclerotic heart disease of native coronary artery without angina pectoris: Secondary | ICD-10-CM | POA: Diagnosis not present

## 2018-07-22 ENCOUNTER — Telehealth: Payer: Self-pay | Admitting: Internal Medicine

## 2018-07-22 NOTE — Telephone Encounter (Signed)
Copied from [place] [id number]. Topic: Quick Communication - Home Health Verbal Orders >> Jul 22, 2018  3:34 PM Adelene Idler wrote: Caller/Agency: [facility] Number: [phone] may leave detailed message Requesting OT/PT/Skilled Nursing/Social Work: PT Frequency: 2 week 1

## 2018-07-22 NOTE — Progress Notes (Signed)
Cardiology Office Note Date:  07/23/2018  Patient ID:  Ed[place], Edwin [place] 1950-10-25, MRN [id number] PCP:  [name], MD  Cardiologist:  Dr. Rockey Situ, MD    Chief Complaint: Hospital follow up  History of Present Illness: Edwin [place] is a 67 y.o. 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   Patient was admitted to [facility] on 06/25/2018 following development of chest pressure the night before. Upon arrival to the ED he was noted to have hematemesis and hypotension with BP in the 34L systolic. He suffered a vasovagal cardiac arrest and received CPR x 1 minute prior to improvement in vitals. He was started on low-dose epi infusion for support and intubated for airway protection. Interventional cardiology was called for concern of possible STEMI. Given his relatively benign appearing EKG, normal cardiac enzyme x 1, and hematemesis, he was not felt to be a cath candidate. Troponin peaked at 0.03. CTA of the chest and abdomen was unrevealing. Echo showed an EF of 55-60%, mild concentric LVH, no RWMA, no significant valvular abnormalities. He underwent Lexiscan Myoview on 06/28/18 that was low risk. Patient required 2 units of pRBC with a discharge HGB of 9.1. He underwent EGD that showed small gastric ulcers and a colonoscopy that showed nonbleeding rectal ulcer. Of note, the patient had been eating a special tomato diet for the month prior to the above. Blood loss was suspected Mallory Weiss tear.   Hospital labs: 10/19  - HGB 9.1, K+ 3.6, SCr 0.85, Mg++ 1.9  Follow labs with PCP on 07/05/18: HGB 10.3, K+ 3.7, SCr 1.04, LFT normal  He has been referred to hematology by PCP.   Patient comes in accompanied by his wife/ex-wife and is doing well from a cardiac perspective.  He has not had any episodes of chest pain, shortness of breath, dizziness, presyncope, or syncope since his hospital discharge.  He has not had any further episodes of emesis or hematemesis.  No falls since he was discharged.  Continues to exercise on a daily basis without limitation from a cardiac perspective.  No lower extremity swelling, abdominal distention, or orthopnea.  Leading up to the above hospital admission, it was noted that the patient had a poor p.o. intake and was not drinking very many fluids throughout the day.  Since then, he is better about eating regularly as well as trying to stay hydrated.  He does continue to note some mild orthostasis.  Both patient and wife/ex-wife are pleased with his progress since his hospital discharge.  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   Past Medical History:  Diagnosis Date  . CAD (coronary artery disease)   . Cardiovascular stress test abnormal    Jan 2010 no significant ischemia  . Hyperlipidemia   . Hypertension   . Presence of bare metal stent in right coronary artery    proximal RCA adn LAD in April 2009  . Trigger finger     Past Surgical History:  Procedure Laterality Date  . APPENDECTOMY    .  APPENDECTOMY    . CARPAL TUNNEL RELEASE    . CATARACT EXTRACTION     right  . COLONOSCOPY WITH PROPOFOL N/A 06/30/2018   Procedure: COLONOSCOPY WITH PROPOFOL;  Surgeon: [name], MD;  Location: [facility] ENDOSCOPY;  Service: Gastroenterology;  Laterality: N/A;  . CORONARY ANGIOPLASTY  12/2007  . CORONARY ARTERY BYPASS GRAFT     2 vessel  . ESOPHAGOGASTRODUODENOSCOPY (EGD) WITH PROPOFOL N/A 06/29/2018   Procedure:  ESOPHAGOGASTRODUODENOSCOPY (EGD) WITH PROPOFOL;  Surgeon: [name], MD;  Location: [facility] ENDOSCOPY;  Service: Gastroenterology;  Laterality: N/A;  . JOINT REPLACEMENT     total knee  . REPLACEMENT TOTAL KNEE    . TONSILLECTOMY    . VASECTOMY      Current Meds  Medication Sig  . alfuzosin (UROXATRAL) 10 MG 24 hr tablet Take 1 tablet (10 mg total) by mouth daily.  . Ascorbic Acid (VITAMIN C) 1000 MG tablet Take 1,000 mg by mouth daily.  Marland Kitchen atorvastatin (LIPITOR) 40 MG tablet Take 1 tablet (40 mg total) by mouth daily at 6 PM. Take 40 mg alt with 20 mg daily.  Marland Kitchen buPROPion (WELLBUTRIN SR) 150 MG 12 hr tablet TAKE 1 TABLET BY MOUTH TWICE A DAY  . calcium carbonate 1250 MG capsule Take 1,250 mg by mouth 2 (two) times daily with a meal.  . citalopram (CELEXA) 20 MG tablet Take 30 mg by mouth daily.  Marland Kitchen donepezil (ARICEPT) 5 MG tablet Take 5 mg by mouth 2 (two) times daily.   . finasteride (PROSCAR) 5 MG tablet Take 5 mg by mouth daily.  . magnesium oxide (MAG-OX) 400 MG tablet TAKE 1 TABLET (400 MG TOTAL) BY MOUTH 2 (TWO) TIMES DAILY AFTER A MEAL (Patient taking differently: Take 400 mg by mouth 2 (two) times daily. )  . nitroGLYCERIN (NITROSTAT) 0.4 MG SL tablet Place 1 tablet (0.4 mg total) under the tongue every 5 (five) minutes as needed.  Marland Kitchen olopatadine (PATANOL) 0.1 % ophthalmic solution Place 1 drop into both eyes 2 (two) times daily.  . pantoprazole (PROTONIX) 40 MG tablet Take 1 tablet (40 mg total) by mouth 2 (two) times daily before a meal.  . risperiDONE (RISPERDAL) 0.25 MG tablet Take 0.25 mg by mouth 2 (two) times daily.   Marland Kitchen zinc gluconate 50 MG tablet Take 50 mg by mouth daily.    Allergies:   Biaxin [clarithromycin]; Clarithromycin; Codeine; Penicillins; Promethazine; Iodinated diagnostic agents; and Metrizamide   Social History:  The patient  reports that he has quit smoking. He has never used smokeless tobacco. He reports that he does not drink alcohol or use drugs.   Family  History:  The patient's family history includes Diabetes in his mother; Heart disease in his father; Hypertension in his mother; Thalassemia in his mother.  ROS:   Review of Systems  Constitutional: Positive for malaise/fatigue. Negative for chills, diaphoresis, fever and weight loss.  HENT: Negative for congestion.   Eyes: Negative for discharge and redness.  Respiratory: Negative for cough, hemoptysis, sputum production, shortness of breath and wheezing.   Cardiovascular: Negative for chest pain, palpitations, orthopnea, claudication, leg swelling and PND.  Gastrointestinal: Negative for abdominal pain, blood in stool, heartburn, melena, nausea and vomiting.  Genitourinary: Negative for hematuria.  Musculoskeletal: Negative for falls and myalgias.  Skin: Negative for rash.  Neurological: Positive for dizziness and weakness. Negative for tingling, tremors, sensory change, speech change, focal weakness and loss of consciousness.  Endo/Heme/Allergies: Does not bruise/bleed easily.  Psychiatric/Behavioral: Negative for substance abuse.  The patient is not nervous/anxious.   All other systems reviewed and are negative.    PHYSICAL EXAM:  VS:  BP 128/78 (BP Location: Left Arm, Patient Position: Sitting, Cuff Size: Normal)   Pulse 64   Ht 5\' 3"  (1.6 m)   Wt 180 lb (81.6 kg)   BMI 31.89 kg/m  BMI: Body mass index is 31.89 kg/m.  Physical Exam  Constitutional: He is oriented to person, place, and time. He appears well-developed and well-nourished.  HENT:  Head: Normocephalic and atraumatic.  Eyes: Right eye exhibits no discharge. Left eye exhibits no discharge.  Neck: Normal range of motion. No JVD present.  Cardiovascular: Normal rate, regular rhythm, S1 normal, S2 normal and normal heart sounds. Exam reveals no distant heart sounds, no friction rub, no midsystolic click and no opening snap.  No murmur heard. Pulses:      Posterior tibial pulses are 2+ on the right side, and 2+ on the  left side.  Pulmonary/Chest: Effort normal and breath sounds normal. No respiratory distress. He has no decreased breath sounds. He has no wheezes. He has no rales. He exhibits no tenderness.  Abdominal: Soft. He exhibits no distension. There is no tenderness.  Musculoskeletal: He exhibits no edema.  Neurological: He is alert and oriented to person, place, and time.  Skin: Skin is warm and dry. No cyanosis. Nails show no clubbing.  Psychiatric: He has a normal mood and affect. His speech is normal and behavior is normal. Judgment and thought content normal.     EKG:  Was ordered and interpreted by me today. 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   Wt Readings from Last 3 Encounters:  07/23/18 180 lb (81.6 kg)  07/05/18 175 lb 3.2 oz (79.5 kg)  06/27/18 172 lb 14.4 oz (78.4 kg)     Other studies reviewed: Additional studies/records reviewed today include: summarized above  ASSESSMENT AND PLAN:  1. CAD involving the native coronary arteries without angina: He is doing well without any symptoms concerning for angina.  Recent echo demonstrating preserved LV systolic function with normal wall motion.  Lexiscan Myoview in the setting of his hospitalization as above was low risk without evidence for significant ischemia.  Continue to manage medically given he is asymptomatic and at the patient's family's request.  Should he have recurrence of symptoms would pursue cardiac catheterization at that time.  Not currently on aspirin given underlying anemia with recent GI bleed.  Would  recommend resumption of aspirin 81 mg daily once he has been given clearance from GI and hematology.  Aggressive risk factor modification and secondary prevention.  2. Suspected vasovagal cardiac arrest: Felt to be in the setting of emesis as below.  Cardiac work-up unrevealing.  No further issues.  Continue to monitor.  3. Hematemesis: Felt to have precipitated #2.  Has follow-up with GI.  Most recent CBC showed an improving hemoglobin up to 10.3 on 10/28.  4. Anemia: Has been referred to hematology by PCP.  Patient indicates a history of thalassemia minor.  5. CKD stage III: Most recent serum creatinine improved to 1.04.  6. Bradycardia: Resolved status post discontinuation  of beta-blocker.  7. Orthostatic hypotension: Recommend he increase his fluid intake.  Should orthostasis persist following adequate hydration could consider medication.  Disposition: F/u with Dr. Rockey Situ or an APP in 2 to 3 months.  Current medicines are reviewed at length with the patient today.  The patient did not have any concerns regarding medicines.  Signed, Christell Faith, PA-C 07/23/2018 3:01 PM     [address], [place] [address] [phone]

## 2018-07-22 NOTE — Telephone Encounter (Signed)
Verbal orders given for PT as stated below spoke with Mickel Baas Lajean Manes

## 2018-07-23 ENCOUNTER — Other Ambulatory Visit: Payer: Self-pay | Admitting: Internal Medicine

## 2018-07-23 ENCOUNTER — Ambulatory Visit: Payer: Medicare HMO | Admitting: Physician Assistant

## 2018-07-23 ENCOUNTER — Encounter: Payer: Self-pay | Admitting: Physician Assistant

## 2018-07-23 VITALS — BP 128/78 | HR 64 | Ht 63.0 in | Wt 180.0 lb

## 2018-07-23 DIAGNOSIS — N183 Chronic kidney disease, stage 3 unspecified: Secondary | ICD-10-CM

## 2018-07-23 DIAGNOSIS — R001 Bradycardia, unspecified: Secondary | ICD-10-CM

## 2018-07-23 DIAGNOSIS — I2510 Atherosclerotic heart disease of native coronary artery without angina pectoris: Secondary | ICD-10-CM | POA: Diagnosis not present

## 2018-07-23 DIAGNOSIS — D649 Anemia, unspecified: Secondary | ICD-10-CM | POA: Diagnosis not present

## 2018-07-23 DIAGNOSIS — G1220 Motor neuron disease, unspecified: Secondary | ICD-10-CM | POA: Diagnosis not present

## 2018-07-23 DIAGNOSIS — M6281 Muscle weakness (generalized): Secondary | ICD-10-CM | POA: Diagnosis not present

## 2018-07-23 DIAGNOSIS — D500 Iron deficiency anemia secondary to blood loss (chronic): Secondary | ICD-10-CM | POA: Diagnosis not present

## 2018-07-23 DIAGNOSIS — R69 Illness, unspecified: Secondary | ICD-10-CM | POA: Diagnosis not present

## 2018-07-23 DIAGNOSIS — K920 Hematemesis: Secondary | ICD-10-CM

## 2018-07-23 DIAGNOSIS — I951 Orthostatic hypotension: Secondary | ICD-10-CM

## 2018-07-23 DIAGNOSIS — G3109 Other frontotemporal dementia: Secondary | ICD-10-CM | POA: Diagnosis not present

## 2018-07-23 DIAGNOSIS — D569 Thalassemia, unspecified: Secondary | ICD-10-CM | POA: Diagnosis not present

## 2018-07-23 DIAGNOSIS — R55 Syncope and collapse: Secondary | ICD-10-CM

## 2018-07-23 NOTE — Telephone Encounter (Signed)
Requesting a change of Pantoprazole to 40 mg daily as insurance will not cover previous order by hospitalist for BID. Wife says if 1 x day is not sufficient, is there an alternate medication that might be covered by insurance. Wife also says patient's stomach is not bothering him at this time and all spicy foods/sauces have been thrown out. GI appointment is 08/09/18 and he will run out of the medication on 07/29/18.

## 2018-07-23 NOTE — Telephone Encounter (Signed)
Please advise 

## 2018-07-23 NOTE — Telephone Encounter (Signed)
Requested medication (s) are due for refill today: yes  Requested medication (s) are on the active medication list: yes  Last refill:  07/01/18  Future visit scheduled: yes  Notes to clinic:  Requesting a change of Pantoprazole to 40 mg daily as insurance will not cover previous order by hospitalist for BID. Wife says if 1 x day is not sufficient, is there an alternate medication that might be covered by insurance. Wife also says patient's stomach is not bothering him at this time and all spicy foods/sauces have been thrown out. GI appointment is 08/09/18 and he will run out of the medication on 07/29/18.      Documentation       Requested Prescriptions  Pending Prescriptions Disp Refills   pantoprazole (PROTONIX) 40 MG tablet 60 tablet 0    Sig: Take 1 tablet (40 mg total) by mouth 2 (two) times daily before a meal.     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

## 2018-07-23 NOTE — Telephone Encounter (Signed)
Copied from [place] [id number]. Topic: Quick Communication - Rx Refill/Question >> Jul 23, 2018 12:58 PM Margot Ables wrote: Medication: pantoprazole (PROTONIX) 40 MG tablet - hospitalist ordered bid - insurance only covers 1/day - pt will run out on 07/29/18 - GI appt is 12/2 and pt will be out of medication - GI will not call back (she left them 3 msgs) - she called the hospital and they will not refill - can Dr. Derrel Nip prescribe? Would 1/day be sufficient? If 1/day is not sufficient, is there an alternate medication that might be covered by insurance?  Wife also notes she threw away all spicy foods/sauces, etc. She said the endoscopy and colonoscopy were clear. She states it doesn't seem that pts stomach is bothering him right now.   Has the patient contacted their pharmacy? yes Preferred Pharmacy (with phone number or street name): [id number] [place], [place] [phone] (Phone) [phone] (Fax)

## 2018-07-23 NOTE — Patient Instructions (Signed)
Medication Instructions:  Your physician recommends that you continue on your current medications as directed. Please refer to the Current Medication list given to you today.  If you need a refill on your cardiac medications before your next appointment, please call your pharmacy.   Lab work: No labs  If you have labs (blood work) drawn today and your tests are completely normal, you will receive your results only by: Marland Kitchen MyChart Message (if you have MyChart) OR . A paper copy in the mail If you have any lab test that is abnormal or we need to change your treatment, we will call you to review the results.  Testing/Procedures: No tests ordered   Follow-Up: At [facility], you and your health needs are our priority.  As part of our continuing mission to provide you with exceptional heart care, we have created designated Provider Care Teams.  These Care Teams include your primary Cardiologist (physician) and Advanced Practice Providers (APPs -  Physician Assistants and Nurse Practitioners) who all work together to provide you with the care you need, when you need it. You will need a follow up appointment in 3 months.   You may see Ida Rogue, MD or one of the following Advanced Practice Providers on your designated Care Team:   Murray Hodgkins, NP Christell Faith, PA-C . Marrianne Mood, PA-C

## 2018-07-26 ENCOUNTER — Telehealth: Payer: Self-pay | Admitting: Internal Medicine

## 2018-07-26 DIAGNOSIS — D569 Thalassemia, unspecified: Secondary | ICD-10-CM | POA: Diagnosis not present

## 2018-07-26 DIAGNOSIS — R69 Illness, unspecified: Secondary | ICD-10-CM | POA: Diagnosis not present

## 2018-07-26 DIAGNOSIS — D500 Iron deficiency anemia secondary to blood loss (chronic): Secondary | ICD-10-CM | POA: Diagnosis not present

## 2018-07-26 DIAGNOSIS — G3109 Other frontotemporal dementia: Secondary | ICD-10-CM | POA: Diagnosis not present

## 2018-07-26 DIAGNOSIS — G1220 Motor neuron disease, unspecified: Secondary | ICD-10-CM | POA: Diagnosis not present

## 2018-07-26 DIAGNOSIS — M6281 Muscle weakness (generalized): Secondary | ICD-10-CM | POA: Diagnosis not present

## 2018-07-26 DIAGNOSIS — N183 Chronic kidney disease, stage 3 (moderate): Secondary | ICD-10-CM | POA: Diagnosis not present

## 2018-07-26 DIAGNOSIS — I2510 Atherosclerotic heart disease of native coronary artery without angina pectoris: Secondary | ICD-10-CM | POA: Diagnosis not present

## 2018-07-26 MED ORDER — PANTOPRAZOLE SODIUM 40 MG PO TBEC: 40.0000 mg | DELAYED_RELEASE_TABLET | Freq: Every day | ORAL | 1 refills | Status: DC

## 2018-07-26 NOTE — Telephone Encounter (Signed)
Spoke with pt's wife and informed her of the medication changes. Wife gave a verbal understanding and stated that they would start this on Friday.

## 2018-07-26 NOTE — Telephone Encounter (Signed)
Pantoprazole refilled for once daily use.  Add famotidine 20 mg daily in the late afternoon if needed.  Available OTC

## 2018-07-26 NOTE — Telephone Encounter (Signed)
Called pt and pt spouse regarding request for the refill on Protonix. Unable to contact, LVM to return call

## 2018-07-28 DIAGNOSIS — M6281 Muscle weakness (generalized): Secondary | ICD-10-CM | POA: Diagnosis not present

## 2018-07-28 DIAGNOSIS — R69 Illness, unspecified: Secondary | ICD-10-CM | POA: Diagnosis not present

## 2018-07-28 DIAGNOSIS — G3109 Other frontotemporal dementia: Secondary | ICD-10-CM | POA: Diagnosis not present

## 2018-07-28 DIAGNOSIS — I2510 Atherosclerotic heart disease of native coronary artery without angina pectoris: Secondary | ICD-10-CM | POA: Diagnosis not present

## 2018-07-28 DIAGNOSIS — D569 Thalassemia, unspecified: Secondary | ICD-10-CM | POA: Diagnosis not present

## 2018-07-28 DIAGNOSIS — N183 Chronic kidney disease, stage 3 (moderate): Secondary | ICD-10-CM | POA: Diagnosis not present

## 2018-07-28 DIAGNOSIS — G1220 Motor neuron disease, unspecified: Secondary | ICD-10-CM | POA: Diagnosis not present

## 2018-07-28 DIAGNOSIS — D500 Iron deficiency anemia secondary to blood loss (chronic): Secondary | ICD-10-CM | POA: Diagnosis not present

## 2018-08-04 DIAGNOSIS — G3109 Other frontotemporal dementia: Secondary | ICD-10-CM | POA: Diagnosis not present

## 2018-08-04 DIAGNOSIS — F0280 Dementia in other diseases classified elsewhere without behavioral disturbance: Secondary | ICD-10-CM | POA: Diagnosis not present

## 2018-08-09 ENCOUNTER — Ambulatory Visit: Payer: Medicare HMO | Admitting: Gastroenterology

## 2018-08-09 ENCOUNTER — Encounter: Payer: Self-pay | Admitting: Gastroenterology

## 2018-08-09 ENCOUNTER — Other Ambulatory Visit: Payer: Self-pay

## 2018-08-09 VITALS — BP 104/74 | HR 71 | Ht 63.0 in | Wt 184.2 lb

## 2018-08-09 DIAGNOSIS — D509 Iron deficiency anemia, unspecified: Secondary | ICD-10-CM

## 2018-08-09 DIAGNOSIS — K259 Gastric ulcer, unspecified as acute or chronic, without hemorrhage or perforation: Secondary | ICD-10-CM | POA: Diagnosis not present

## 2018-08-09 NOTE — Progress Notes (Signed)
[name] MD, MRCP(U.K) [address]  [place], [place] [address]  Main: [phone]  Fax: [phone]   Primary Care Physician: [name], MD  Primary Gastroenterologist:  Dr. [name]   Chief Complaint  Patient presents with  . Hospitalization Follow-up    Upper GI bleed    HPI: Edwin [place] is a 67 y.o. male   Summary of history :  He is here today for hospital follow-up.  He was admitted on 06/15/2018 for an upper GI bleed.  Admission he actually had some chest pain with associated dizziness.  He was hypotensive and was vomiting maroon/red blood.  He was intubated put on pressors and admitted.  NG tube showed dark thick material coming out.  Hemoglobin was 9.1 g in March and on admission was 10.6 g. Hospital admission was complicated by Pneumonia. Labs suggested iron deficiency anemia with possible anemia of chronic disease component. Normal b12 and folate.  06/29/18:  EGD:  Two non-bleeding superficial gastric ulcers with a clean ulcer base (Forrest Class III) were found in the prepyloric region of the stomach. The largest lesion was 5 mm in largest dimension. He says he was taking a lot of Advil for a month prior- 1-2 tablets daily. Since then has stopped  Colonoscopy 06/30/18 : poor prep: single solitary rectal ulcer seen non bleeding    Interval history   06/30/2018-  08/09/2018  At this time his stool is brown , no bleeding , eating well. Feels well. Not on any blood thinner.       Current Outpatient Medications  Medication Sig Dispense Refill  . alfuzosin (UROXATRAL) 10 MG 24 hr tablet Take 1 tablet (10 mg total) by mouth daily. 90 tablet 1  . Ascorbic Acid (VITAMIN C) 1000 MG tablet Take 1,000 mg by mouth daily.    Marland Kitchen atorvastatin (LIPITOR) 40 MG tablet Take 1 tablet (40 mg total) by mouth daily at 6 PM. Take 40 mg alt with 20 mg daily. 90 tablet 2  . buPROPion (WELLBUTRIN SR) 150 MG 12 hr tablet TAKE 1 TABLET BY MOUTH TWICE A DAY  60 tablet 6  . calcium carbonate 1250 MG capsule Take 1,250 mg by mouth 2 (two) times daily with a meal.    . citalopram (CELEXA) 20 MG tablet Take 30 mg by mouth daily.    Marland Kitchen donepezil (ARICEPT) 5 MG tablet Take 5 mg by mouth 2 (two) times daily.     . finasteride (PROSCAR) 5 MG tablet Take 5 mg by mouth daily.    . magnesium oxide (MAG-OX) 400 MG tablet TAKE 1 TABLET (400 MG TOTAL) BY MOUTH 2 (TWO) TIMES DAILY AFTER A MEAL (Patient taking differently: Take 400 mg by mouth 2 (two) times daily. ) 60 tablet 3  . nitroGLYCERIN (NITROSTAT) 0.4 MG SL tablet Place 1 tablet (0.4 mg total) under the tongue every 5 (five) minutes as needed. 90 tablet 3  . olopatadine (PATANOL) 0.1 % ophthalmic solution Place 1 drop into both eyes 2 (two) times daily. 5 mL 12  . pantoprazole (PROTONIX) 40 MG tablet Take 1 tablet (40 mg total) by mouth daily. 90 tablet 1  . risperiDONE (RISPERDAL) 0.25 MG tablet Take 0.25 mg by mouth 2 (two) times daily.     Marland Kitchen zinc gluconate 50 MG tablet Take 50 mg by mouth daily.     No current facility-administered medications for this visit.     Allergies as of 08/09/2018 - Review Complete 08/09/2018  Allergen Reaction Noted  . Biaxin [clarithromycin]    . Clarithromycin  05/13/2011  . Codeine    . Penicillins    . Promethazine  10/05/2013  . Iodinated diagnostic agents Rash 03/21/2013  . Metrizamide Rash 03/21/2013    ROS:  General: Negative for anorexia, weight loss, fever, chills, fatigue, weakness. ENT: Negative for hoarseness, difficulty swallowing , nasal congestion. CV: Negative for chest pain, angina, palpitations, dyspnea on exertion, peripheral edema.  Respiratory: Negative for dyspnea at rest, dyspnea on exertion, cough, sputum, wheezing.  GI: See history of present illness. GU:  Negative for dysuria, hematuria, urinary incontinence, urinary frequency, nocturnal urination.  Endo: Negative for unusual weight change.    Physical Examination:   BP 104/74    Pulse 71   Ht 5\' 3"  (1.6 m)   Wt 184 lb 3.2 oz (83.6 kg)   BMI 32.63 kg/m   General: Well-nourished, well-developed in no acute distress.  Eyes: No icterus. Conjunctivae pink. Mouth: Oropharyngeal mucosa moist and pink , no lesions erythema or exudate. Lungs: Clear to auscultation bilaterally. Non-labored. Heart: Regular rate and rhythm, no murmurs rubs or gallops.  Abdomen: Bowel sounds are normal, nontender, nondistended, no hepatosplenomegaly or masses, no abdominal bruits or hernia , no rebound or guarding.   Extremities: No lower extremity edema. No clubbing or deformities. Neuro: Alert and oriented x 3.  Grossly intact. Skin: Warm and dry, no jaundice.   Psych: Alert and cooperative, normal mood and affect.   Imaging Studies: No results found.  Assessment and Plan:   Edwin [place] is a 66 y.o. y/o male for hospital follow up for upper GI bleed secondary to gastric ulcers. Labs suggest a component of iron deficiency. Colonoscopy showed a solitary rectal ulcer. Likely NSAID related ulcer. Not on any iron   Plan 0 1.Capsule study of the small bowel if still iron deficienct 2. Colonoscopy  to check for evaluation of healing of rectal ulcer along with EGD to check for healing of the gastric ulcers after 4-6 weeks.  3.Check CBC today  4 Can restart Asprin if needed  5. Check for H pylori after PPI has been stopped which will be after his   I have discussed alternative options, risks & benefits,  which include, but are not limited to, bleeding, infection, perforation,respiratory complication & drug reaction.  The patient agrees with this plan & written consent will be obtained.     Dr [name]  MD,MRCP [facility]) Follow up in 8-10 weeks

## 2018-08-10 LAB — CBC WITH DIFFERENTIAL/PLATELET
BASOS ABS: 0 10*3/uL (ref 0.0–0.2)
BASOS: 1 %
EOS (ABSOLUTE): 0.1 10*3/uL (ref 0.0–0.4)
Eos: 2 %
Hematocrit: 38.5 % (ref 37.5–51.0)
Hemoglobin: 12.2 g/dL — ABNORMAL LOW (ref 13.0–17.7)
IMMATURE GRANS (ABS): 0 10*3/uL (ref 0.0–0.1)
Immature Granulocytes: 0 %
LYMPHS: 22 %
Lymphocytes Absolute: 1.4 10*3/uL (ref 0.7–3.1)
MCH: 22.3 pg — AB (ref 26.6–33.0)
MCHC: 31.7 g/dL (ref 31.5–35.7)
MCV: 71 fL — ABNORMAL LOW (ref 79–97)
Monocytes Absolute: 0.5 10*3/uL (ref 0.1–0.9)
Monocytes: 8 %
NEUTROS ABS: 4.4 10*3/uL (ref 1.4–7.0)
Neutrophils: 67 %
Platelets: 308 10*3/uL (ref 150–450)
RBC: 5.46 x10E6/uL (ref 4.14–5.80)
RDW: 17.9 % — ABNORMAL HIGH (ref 12.3–15.4)
WBC: 6.6 10*3/uL (ref 3.4–10.8)

## 2018-08-10 LAB — IRON,TIBC AND FERRITIN PANEL
FERRITIN: 90 ng/mL (ref 30–400)
IRON: 87 ug/dL (ref 38–169)
Iron Saturation: 33 % (ref 15–55)
TIBC: 261 ug/dL (ref 250–450)
UIBC: 174 ug/dL (ref 111–343)

## 2018-08-13 ENCOUNTER — Telehealth: Payer: Self-pay

## 2018-08-13 NOTE — Telephone Encounter (Signed)
-----   Message from [name], MD sent at 08/10/2018 11:23 AM EST ----- Julious Payer inform CBC,iron studies almost back to normal.   C/c Derrel Nip, Aris Everts, MD   Dr [name] MD,MRCP [facility]) Gastroenterology/Hepatology Pager: [phone]

## 2018-08-13 NOTE — Telephone Encounter (Signed)
Called pt to inform him of lab results.  Unable to contact, LVM to return call

## 2018-08-15 NOTE — Progress Notes (Signed)
[place]  [phone] [phone]) [phone]  ID: Edwin Woodward OB: 03-Nov-1950  MR#: [id number]  PYP#:[id number]  Patient Care Team: [name], MD as PCP - General (Internal Medicine) Minna Merritts, MD as PCP - Cardiology (Cardiology)  CHIEF COMPLAINT: Beta thalassemia minor.  Recent GI bleed.  INTERVAL HISTORY: Patient is a 67 year old male who recently had a GI bleed likely secondary to gastric ulcers who also has a diagnosis of beta thalassemia minor.  He currently feels well and is asymptomatic.  He denies any further bleeding.  He has no neurologic complaints.  He denies any recent fevers or illnesses.  He has a good appetite and denies weight loss.  He has no chest pain or shortness of breath.  He denies any nausea, vomiting, constipation, or diarrhea.  He has no urinary complaints.  Patient feels at his baseline offers no specific complaints today.  REVIEW OF SYSTEMS:   Review of Systems  Constitutional: Negative.  Negative for fever, malaise/fatigue and weight loss.  Respiratory: Negative.  Negative for cough, hemoptysis and shortness of breath.   Cardiovascular: Negative.  Negative for chest pain and leg swelling.  Gastrointestinal: Negative.  Negative for abdominal pain, blood in stool and melena.  Genitourinary: Negative.  Negative for hematuria.  Musculoskeletal: Negative.  Negative for back pain.  Skin: Negative.  Negative for rash.  Neurological: Negative.  Negative for focal weakness, weakness and headaches.  Psychiatric/Behavioral: Negative.  The patient is not nervous/anxious.     As per HPI. Otherwise, a complete review of systems is negative.  PAST MEDICAL HISTORY: Past Medical History:  Diagnosis Date  . CAD (coronary artery disease)   . Cardiovascular stress test abnormal    Jan 2010 no significant ischemia  . Hyperlipidemia   . Hypertension   . Presence of bare metal stent in right coronary artery    proximal RCA  adn LAD in April 2009  . Trigger finger     PAST SURGICAL HISTORY: Past Surgical History:  Procedure Laterality Date  . APPENDECTOMY    . APPENDECTOMY    . CARPAL TUNNEL RELEASE    . CATARACT EXTRACTION     right  . COLONOSCOPY WITH PROPOFOL N/A 06/30/2018   Procedure: COLONOSCOPY WITH PROPOFOL;  Surgeon: [name], MD;  Location: [facility] ENDOSCOPY;  Service: Gastroenterology;  Laterality: N/A;  . CORONARY ANGIOPLASTY  12/2007  . CORONARY ARTERY BYPASS GRAFT     2 vessel  . ESOPHAGOGASTRODUODENOSCOPY (EGD) WITH PROPOFOL N/A 06/29/2018   Procedure: ESOPHAGOGASTRODUODENOSCOPY (EGD) WITH PROPOFOL;  Surgeon: [name], MD;  Location: [facility] ENDOSCOPY;  Service: Gastroenterology;  Laterality: N/A;  . JOINT REPLACEMENT     total knee  . REPLACEMENT TOTAL KNEE    . TONSILLECTOMY    . VASECTOMY      FAMILY HISTORY: Family History  Problem Relation Age of Onset  . Hypertension Mother   . Diabetes Mother   . Thalassemia Mother   . Heart disease Father     ADVANCED DIRECTIVES (Y/N):  N  HEALTH MAINTENANCE: Social History   Tobacco Use  . Smoking status: Former Research scientist (life sciences)  . Smokeless tobacco: Never Used  Substance Use Topics  . Alcohol use: No    Alcohol/week: 0.0 standard drinks  . Drug use: No    Types: Marijuana     Colonoscopy:  PAP:  Bone density:  Lipid panel:  Allergies  Allergen Reactions  . Biaxin [Clarithromycin]   . Clarithromycin   . Codeine  Sever vomiting   . Penicillins   . Promethazine   . Iodinated Diagnostic Agents Rash  . Metrizamide Rash    Current Outpatient Medications  Medication Sig Dispense Refill  . alfuzosin (UROXATRAL) 10 MG 24 hr tablet Take 1 tablet (10 mg total) by mouth daily. 90 tablet 1  . Ascorbic Acid (VITAMIN C) 1000 MG tablet Take 1,000 mg by mouth daily.    Marland Kitchen atorvastatin (LIPITOR) 40 MG tablet Take 1 tablet (40 mg total) by mouth daily at 6 PM. Take 40 mg alt with 20 mg daily. 90 tablet 2  . buPROPion (WELLBUTRIN SR)  150 MG 12 hr tablet TAKE 1 TABLET BY MOUTH TWICE A DAY 60 tablet 6  . calcium carbonate 1250 MG capsule Take 1,250 mg by mouth 2 (two) times daily with a meal.    . citalopram (CELEXA) 20 MG tablet Take 30 mg by mouth daily.    Marland Kitchen donepezil (ARICEPT) 5 MG tablet Take 5 mg by mouth 2 (two) times daily.     . finasteride (PROSCAR) 5 MG tablet Take 5 mg by mouth daily.    . magnesium oxide (MAG-OX) 400 MG tablet TAKE 1 TABLET (400 MG TOTAL) BY MOUTH 2 (TWO) TIMES DAILY AFTER A MEAL (Patient taking differently: Take 400 mg by mouth 2 (two) times daily. ) 60 tablet 3  . nitroGLYCERIN (NITROSTAT) 0.4 MG SL tablet Place 1 tablet (0.4 mg total) under the tongue every 5 (five) minutes as needed. 90 tablet 3  . olopatadine (PATANOL) 0.1 % ophthalmic solution Place 1 drop into both eyes 2 (two) times daily. 5 mL 12  . pantoprazole (PROTONIX) 40 MG tablet Take 1 tablet (40 mg total) by mouth daily. 90 tablet 1  . risperiDONE (RISPERDAL) 0.25 MG tablet Take 0.25 mg by mouth 2 (two) times daily.     Marland Kitchen zinc gluconate 50 MG tablet Take 50 mg by mouth daily.     No current facility-administered medications for this visit.     OBJECTIVE: Vitals:   08/16/18 1347 08/16/18 1359  BP:  121/78  Pulse:  61  Resp: 16   Temp:  97.6 F (36.4 C)     Body mass index is 33.14 kg/m.    ECOG FS:0 - Asymptomatic  General: Well-developed, well-nourished, no acute distress. Eyes: Pink conjunctiva, anicteric sclera. HEENT: Normocephalic, moist mucous membranes, clear oropharnyx. Lungs: Clear to auscultation bilaterally. Heart: Regular rate and rhythm. No rubs, murmurs, or gallops. Abdomen: Soft, nontender, nondistended. No organomegaly noted, normoactive bowel sounds. Musculoskeletal: No edema, cyanosis, or clubbing. Neuro: Alert, answering all questions appropriately. Cranial nerves grossly intact. Skin: No rashes or petechiae noted. Psych: Normal affect. Lymphatics: No cervical, calvicular, axillary or inguinal  LAD.   LAB RESULTS:  Lab Results  Component Value Date   NA 139 07/05/2018   K 3.7 07/05/2018   CL 106 07/05/2018   CO2 23 07/05/2018   GLUCOSE 83 07/05/2018   BUN 18 07/05/2018   CREATININE 1.04 07/05/2018   CALCIUM 9.1 07/05/2018   PROT 6.6 07/05/2018   ALBUMIN 3.9 07/05/2018   AST 32 07/05/2018   ALT 46 07/05/2018   ALKPHOS 65 07/05/2018   BILITOT 0.6 07/05/2018   GFRNONAA >60 06/30/2018   GFRAA >60 06/30/2018    Lab Results  Component Value Date   WBC 6.6 08/09/2018   NEUTROABS 4.4 08/09/2018   HGB 12.2 (L) 08/09/2018   HCT 38.5 08/09/2018   MCV 71 (L) 08/09/2018   PLT 308 08/09/2018  Lab Results  Component Value Date   IRON 87 08/09/2018   TIBC 261 08/09/2018   IRONPCTSAT 33 08/09/2018   Lab Results  Component Value Date   FERRITIN 90 08/09/2018     STUDIES: No results found.  ASSESSMENT: Beta thalassemia minor.  Recent GI bleed.  PLAN:  1.  Beta thalassemia minor: Patient had hemoglobin electrophoresis in 2010 confirming diagnosis.  Patient likely has a baseline anemia as well as a microcytosis because of this.  No intervention is needed.  Patient reports 1 of 3 sons also carry this diagnosis.  Patient has normal iron stores, therefore despite his microcytosis he does not require oral or IV iron therapy.  No intervention is needed.  Patient does not require follow-up. 2.  Microcytosis: Patient's MCV is 71 which is likely his baseline secondary to his beta thalassemia minor. No intervention needed.  Patient does not require iron supplementation. 3.  GI bleed: Appreciate GI input.  Possibly secondary to duodenal ulcers.  Patient states he has repeat endoscopies scheduled for January 2020.  I spent a total of 45 minutes face-to-face with the patient of which greater than 50% of the visit was spent in counseling and coordination of care as detailed above.   Patient expressed understanding and was in agreement with this plan. He also understands that He  can call clinic at any time with any questions, concerns, or complaints.    Lloyd Huger, MD   08/17/2018 9:03 AM

## 2018-08-16 ENCOUNTER — Telehealth: Payer: Self-pay | Admitting: Cardiovascular Disease

## 2018-08-16 ENCOUNTER — Encounter: Payer: Self-pay | Admitting: Oncology

## 2018-08-16 ENCOUNTER — Telehealth: Payer: Self-pay | Admitting: Gastroenterology

## 2018-08-16 ENCOUNTER — Inpatient Hospital Stay: Payer: Medicare HMO | Attending: Oncology | Admitting: Oncology

## 2018-08-16 ENCOUNTER — Other Ambulatory Visit: Payer: Self-pay

## 2018-08-16 VITALS — BP 121/78 | HR 61 | Temp 97.6°F | Resp 16 | Ht 63.0 in | Wt 187.1 lb

## 2018-08-16 DIAGNOSIS — Z87891 Personal history of nicotine dependence: Secondary | ICD-10-CM | POA: Diagnosis not present

## 2018-08-16 DIAGNOSIS — Z79899 Other long term (current) drug therapy: Secondary | ICD-10-CM | POA: Diagnosis not present

## 2018-08-16 DIAGNOSIS — D569 Thalassemia, unspecified: Secondary | ICD-10-CM | POA: Diagnosis not present

## 2018-08-16 DIAGNOSIS — I10 Essential (primary) hypertension: Secondary | ICD-10-CM | POA: Diagnosis not present

## 2018-08-16 DIAGNOSIS — D563 Thalassemia minor: Secondary | ICD-10-CM | POA: Insufficient documentation

## 2018-08-16 NOTE — Progress Notes (Signed)
Patient here for initial visit. Recent complicated medical history. Denies shortness of breath, patient states he is always cold.

## 2018-08-16 NOTE — Telephone Encounter (Signed)
   [facility] Pre-operative Risk Assessment    Request for surgical clearance:  What type of surgery is being performed? Endoscopy  1. When is this surgery scheduled? 09/14/2018  2. What type of clearance is required (medical clearance vs. Pharmacy clearance to hold med vs. Both)? Not listed  3. Are there any medications that need to be held prior to surgery and how long? Not listed  4. Practice name and name of physician performing surgery? [facility], Dr. Vicente Males  What is your office phone number [phone]  7.   What is your office fax number [phone]  8.   Anesthesia type (None, local, MAC, general) ?  Not listed  Edwin Woodward 08/16/2018, 8:41 AM  _________________________________________________________________   (provider comments below)

## 2018-08-16 NOTE — Telephone Encounter (Signed)
Patient with recent echo demonstrating preserved LVSF and an Myoview that was low risk. He is not currently on any antiplatelets or anticoagulation. Per Modified [place] Criteria, he is low risk for a low risk procedure. No further cardiac testing is needed at this time.

## 2018-08-16 NOTE — Telephone Encounter (Signed)
Pt family member is returning [place] call concerning labs. Pt can't hear well. Pls call

## 2018-08-16 NOTE — Telephone Encounter (Signed)
Routed to number provided via EP[facility] fax.  

## 2018-08-26 ENCOUNTER — Telehealth: Payer: Self-pay | Admitting: Gastroenterology

## 2018-08-26 NOTE — Telephone Encounter (Signed)
Called pt to inform him of lab results. Unable to contact. LVM to return call

## 2018-08-26 NOTE — Telephone Encounter (Signed)
Pt is calling back to receive results. Pls call asap

## 2018-09-10 NOTE — Telephone Encounter (Signed)
Spoke to pt spouse, Harmon Pier, and informed her of pt lab results.

## 2018-09-13 ENCOUNTER — Telehealth: Payer: Self-pay | Admitting: Gastroenterology

## 2018-09-13 NOTE — Telephone Encounter (Signed)
Pt wife left vm regarding pt upcoming procedure she needs a nurse to call her regarding the prep she states the instructions  She received are not aligning with instructions from Pharmacy

## 2018-09-13 NOTE — Telephone Encounter (Signed)
Spoke with pt's wife regarding procedures for tomorrow. Pt's wife was concerned about husband arrival time due to dementia. She was wanting to discuss times to drink bowel prep.

## 2018-09-14 ENCOUNTER — Encounter: Payer: Self-pay | Admitting: *Deleted

## 2018-09-14 ENCOUNTER — Ambulatory Visit: Payer: Medicare HMO | Admitting: Certified Registered"

## 2018-09-14 ENCOUNTER — Encounter
Admission: RE | Disposition: A | Discharge status: Home / Self Care | Payer: Self-pay | Source: Ambulatory Visit | Attending: Gastroenterology

## 2018-09-14 ENCOUNTER — Ambulatory Visit
Admission: RE | Admit: 2018-09-14 | Discharge: 2018-09-14 | Disposition: A | Discharge status: Home / Self Care | Payer: Medicare HMO | Source: Ambulatory Visit | Attending: Gastroenterology | Admitting: Gastroenterology

## 2018-09-14 DIAGNOSIS — K635 Polyp of colon: Secondary | ICD-10-CM | POA: Insufficient documentation

## 2018-09-14 DIAGNOSIS — Z885 Allergy status to narcotic agent status: Secondary | ICD-10-CM | POA: Insufficient documentation

## 2018-09-14 DIAGNOSIS — Z955 Presence of coronary angioplasty implant and graft: Secondary | ICD-10-CM | POA: Insufficient documentation

## 2018-09-14 DIAGNOSIS — Z87891 Personal history of nicotine dependence: Secondary | ICD-10-CM | POA: Insufficient documentation

## 2018-09-14 DIAGNOSIS — K2980 Duodenitis without bleeding: Secondary | ICD-10-CM | POA: Insufficient documentation

## 2018-09-14 DIAGNOSIS — G4733 Obstructive sleep apnea (adult) (pediatric): Secondary | ICD-10-CM | POA: Diagnosis not present

## 2018-09-14 DIAGNOSIS — R195 Other fecal abnormalities: Secondary | ICD-10-CM | POA: Diagnosis present

## 2018-09-14 DIAGNOSIS — K922 Gastrointestinal hemorrhage, unspecified: Secondary | ICD-10-CM | POA: Diagnosis not present

## 2018-09-14 DIAGNOSIS — I10 Essential (primary) hypertension: Secondary | ICD-10-CM | POA: Insufficient documentation

## 2018-09-14 DIAGNOSIS — N183 Chronic kidney disease, stage 3 (moderate): Secondary | ICD-10-CM | POA: Diagnosis not present

## 2018-09-14 DIAGNOSIS — Z9841 Cataract extraction status, right eye: Secondary | ICD-10-CM | POA: Insufficient documentation

## 2018-09-14 DIAGNOSIS — D123 Benign neoplasm of transverse colon: Secondary | ICD-10-CM | POA: Diagnosis not present

## 2018-09-14 DIAGNOSIS — E785 Hyperlipidemia, unspecified: Secondary | ICD-10-CM | POA: Insufficient documentation

## 2018-09-14 DIAGNOSIS — M1990 Unspecified osteoarthritis, unspecified site: Secondary | ICD-10-CM | POA: Insufficient documentation

## 2018-09-14 DIAGNOSIS — I129 Hypertensive chronic kidney disease with stage 1 through stage 4 chronic kidney disease, or unspecified chronic kidney disease: Secondary | ICD-10-CM | POA: Diagnosis not present

## 2018-09-14 DIAGNOSIS — Z880 Allergy status to penicillin: Secondary | ICD-10-CM | POA: Insufficient documentation

## 2018-09-14 DIAGNOSIS — K259 Gastric ulcer, unspecified as acute or chronic, without hemorrhage or perforation: Secondary | ICD-10-CM

## 2018-09-14 DIAGNOSIS — Z91041 Radiographic dye allergy status: Secondary | ICD-10-CM | POA: Insufficient documentation

## 2018-09-14 DIAGNOSIS — D120 Benign neoplasm of cecum: Secondary | ICD-10-CM

## 2018-09-14 DIAGNOSIS — Z881 Allergy status to other antibiotic agents status: Secondary | ICD-10-CM | POA: Insufficient documentation

## 2018-09-14 DIAGNOSIS — D509 Iron deficiency anemia, unspecified: Secondary | ICD-10-CM | POA: Diagnosis not present

## 2018-09-14 DIAGNOSIS — Z833 Family history of diabetes mellitus: Secondary | ICD-10-CM | POA: Diagnosis not present

## 2018-09-14 DIAGNOSIS — D126 Benign neoplasm of colon, unspecified: Secondary | ICD-10-CM | POA: Diagnosis not present

## 2018-09-14 DIAGNOSIS — Z888 Allergy status to other drugs, medicaments and biological substances status: Secondary | ICD-10-CM | POA: Insufficient documentation

## 2018-09-14 DIAGNOSIS — Z79899 Other long term (current) drug therapy: Secondary | ICD-10-CM | POA: Insufficient documentation

## 2018-09-14 DIAGNOSIS — Z951 Presence of aortocoronary bypass graft: Secondary | ICD-10-CM | POA: Insufficient documentation

## 2018-09-14 DIAGNOSIS — Z96659 Presence of unspecified artificial knee joint: Secondary | ICD-10-CM | POA: Insufficient documentation

## 2018-09-14 DIAGNOSIS — Z8249 Family history of ischemic heart disease and other diseases of the circulatory system: Secondary | ICD-10-CM | POA: Diagnosis not present

## 2018-09-14 DIAGNOSIS — E789 Disorder of lipoprotein metabolism, unspecified: Secondary | ICD-10-CM | POA: Diagnosis not present

## 2018-09-14 DIAGNOSIS — Z832 Family history of diseases of the blood and blood-forming organs and certain disorders involving the immune mechanism: Secondary | ICD-10-CM | POA: Diagnosis not present

## 2018-09-14 DIAGNOSIS — I2510 Atherosclerotic heart disease of native coronary artery without angina pectoris: Secondary | ICD-10-CM | POA: Insufficient documentation

## 2018-09-14 DIAGNOSIS — F419 Anxiety disorder, unspecified: Secondary | ICD-10-CM | POA: Diagnosis not present

## 2018-09-14 DIAGNOSIS — K219 Gastro-esophageal reflux disease without esophagitis: Secondary | ICD-10-CM | POA: Insufficient documentation

## 2018-09-14 HISTORY — PX: ESOPHAGOGASTRODUODENOSCOPY (EGD) WITH PROPOFOL: SHX5813

## 2018-09-14 HISTORY — PX: COLONOSCOPY WITH PROPOFOL: SHX5780

## 2018-09-14 SURGERY — COLONOSCOPY WITH PROPOFOL
Anesthesia: General

## 2018-09-14 MED ORDER — EPHEDRINE SULFATE 50 MG/ML IJ SOLN
INTRAMUSCULAR | Status: DC | PRN
  Administered 2018-09-14 (×2): 5 mg via INTRAVENOUS

## 2018-09-14 MED ORDER — PROPOFOL 500 MG/50ML IV EMUL
INTRAVENOUS | Status: AC
  Filled 2018-09-14: qty 50

## 2018-09-14 MED ORDER — LIDOCAINE HCL (PF) 2 % IJ SOLN
INTRAMUSCULAR | Status: AC
  Filled 2018-09-14: qty 10

## 2018-09-14 MED ORDER — EPHEDRINE SULFATE 50 MG/ML IJ SOLN
INTRAMUSCULAR | Status: AC
  Filled 2018-09-14: qty 1

## 2018-09-14 MED ORDER — PROPOFOL 500 MG/50ML IV EMUL
INTRAVENOUS | Status: DC | PRN
  Administered 2018-09-14: 100 ug/kg/min via INTRAVENOUS

## 2018-09-14 MED ORDER — SODIUM CHLORIDE 0.9 % IV SOLN
INTRAVENOUS | Status: DC
  Administered 2018-09-14: 1000 mL via INTRAVENOUS

## 2018-09-14 MED ORDER — LIDOCAINE HCL (CARDIAC) PF 100 MG/5ML IV SOSY
PREFILLED_SYRINGE | INTRAVENOUS | Status: DC | PRN
  Administered 2018-09-14: 80 mg via INTRATRACHEAL

## 2018-09-14 MED ORDER — PROPOFOL 10 MG/ML IV BOLUS
INTRAVENOUS | Status: DC | PRN
  Administered 2018-09-14: 30 mg via INTRAVENOUS
  Administered 2018-09-14: 20 mg via INTRAVENOUS
  Administered 2018-09-14: 50 mg via INTRAVENOUS

## 2018-09-14 NOTE — H&P (Signed)
[name], MD [address] [place], [place], [place], [address] [address], [place], [place], [place], [address] Phone: [phone]  Fax: [phone]  Primary Care Physician:  [name], MD   Pre-Procedure History & Physical: HPI:  Edwin Woodward is a 68 y.o. male is here for an endoscopy and colonoscopy    Past Medical History:  Diagnosis Date  . CAD (coronary artery disease)   . Cardiovascular stress test abnormal    Jan 2010 no significant ischemia  . Hyperlipidemia   . Hypertension   . Presence of bare metal stent in right coronary artery    proximal RCA adn LAD in April 2009  . Trigger finger     Past Surgical History:  Procedure Laterality Date  . APPENDECTOMY    . APPENDECTOMY    . CARPAL TUNNEL RELEASE    . CATARACT EXTRACTION     right  . COLONOSCOPY WITH PROPOFOL N/A 06/30/2018   Procedure: COLONOSCOPY WITH PROPOFOL;  Surgeon: [name], MD;  Location: [facility] ENDOSCOPY;  Service: Gastroenterology;  Laterality: N/A;  . CORONARY ANGIOPLASTY  12/2007  . CORONARY ARTERY BYPASS GRAFT     2 vessel  . ESOPHAGOGASTRODUODENOSCOPY (EGD) WITH PROPOFOL N/A 06/29/2018   Procedure: ESOPHAGOGASTRODUODENOSCOPY (EGD) WITH PROPOFOL;  Surgeon: [name], MD;  Location: [facility] ENDOSCOPY;  Service: Gastroenterology;  Laterality: N/A;  . EYE SURGERY    . JOINT REPLACEMENT     total knee  . REPLACEMENT TOTAL KNEE    . TONSILLECTOMY    . VASECTOMY      Prior to Admission medications   Medication Sig Start Date End Date Taking? Authorizing Provider  alfuzosin (UROXATRAL) 10 MG 24 hr tablet Take 1 tablet (10 mg total) by mouth daily. 05/23/16  Yes [name], MD  Ascorbic Acid (VITAMIN C) 1000 MG tablet Take 1,000 mg by mouth daily.   Yes [provider]  atorvastatin (LIPITOR) 40 MG tablet Take 1 tablet (40 mg total) by mouth daily at 6 PM. Take 40 mg alt with 20 mg daily. 01/07/18  Yes Gollan, Kathlene November, MD  buPROPion (WELLBUTRIN SR) 150 MG 12  hr tablet TAKE 1 TABLET BY MOUTH TWICE A DAY 08/13/15  Yes Clapacs, Madie Reno, MD  calcium carbonate 1250 MG capsule Take 1,250 mg by mouth 2 (two) times daily with a meal.   Yes [provider]  citalopram (CELEXA) 20 MG tablet Take 30 mg by mouth daily. 03/20/16  Yes [provider]  finasteride (PROSCAR) 5 MG tablet Take 5 mg by mouth daily.   Yes [provider]  magnesium oxide (MAG-OX) 400 MG tablet TAKE 1 TABLET (400 MG TOTAL) BY MOUTH 2 (TWO) TIMES DAILY AFTER A MEAL Patient taking differently: Take 400 mg by mouth 2 (two) times daily.  04/22/18  Yes [name], MD  pantoprazole (PROTONIX) 40 MG tablet Take 1 tablet (40 mg total) by mouth daily. 07/26/18 10/24/18 Yes [name], MD  risperiDONE (RISPERDAL) 0.25 MG tablet Take 0.25 mg by mouth 2 (two) times daily.  09/14/15 06/26/19 Yes [provider]  zinc gluconate 50 MG tablet Take 50 mg by mouth daily.   Yes [provider]  donepezil (ARICEPT) 5 MG tablet Take 5 mg by mouth 2 (two) times daily.  03/20/16   [provider]  nitroGLYCERIN (NITROSTAT) 0.4 MG SL tablet Place 1 tablet (0.4 mg total) under the tongue every 5 (five) minutes as needed. 04/11/11  Minna Merritts, MD  olopatadine (PATANOL) 0.1 % ophthalmic solution Place 1 drop into both eyes 2 (two) times daily. Patient not taking: Reported on 09/14/2018 06/22/17   [name], MD    Allergies as of 08/10/2018 - Review Complete 08/09/2018  Allergen Reaction Noted  . Biaxin [clarithromycin]    . Clarithromycin  05/13/2011  . Codeine    . Penicillins    . Promethazine  10/05/2013  . Iodinated diagnostic agents Rash 03/21/2013  . Metrizamide Rash 03/21/2013    Family History  Problem Relation Age of Onset  . Hypertension Mother   . Diabetes Mother   . Thalassemia Mother   . Heart disease Father     Social History   Socioeconomic History  . Marital status: Married    Spouse name: Not on file  . Number of  children: Not on file  . Years of education: Not on file  . Highest education level: Not on file  Occupational History  . Not on file  Social Needs  . Financial resource strain: Not on file  . Food insecurity:    Worry: Not on file    Inability: Not on file  . Transportation needs:    Medical: Not on file    Non-medical: Not on file  Tobacco Use  . Smoking status: Former Research scientist (life sciences)  . Smokeless tobacco: Never Used  Substance and Sexual Activity  . Alcohol use: No    Alcohol/week: 0.0 standard drinks  . Drug use: No    Types: Marijuana    Comment: 10 years  . Sexual activity: Not on file  Lifestyle  . Physical activity:    Days per week: Not on file    Minutes per session: Not on file  . Stress: Not on file  Relationships  . Social connections:    Talks on phone: Not on file    Gets together: Not on file    Attends religious service: Not on file    Active member of club or organization: Not on file    Attends meetings of clubs or organizations: Not on file    Relationship status: Not on file  . Intimate partner violence:    Fear of current or ex partner: Not on file    Emotionally abused: Not on file    Physically abused: Not on file    Forced sexual activity: Not on file  Other Topics Concern  . Not on file  Social History Narrative   Lives with wife.    Review of Systems: See HPI, otherwise negative ROS  Physical Exam: Pulse 64   Temp (!) 96.3 F (35.7 C) (Tympanic)   Resp 18   Ht 5\' 4"  (1.626 m)   Wt 83.9 kg   SpO2 96%   BMI 31.76 kg/m  General:   Alert,  pleasant and cooperative in NAD Head:  Normocephalic and atraumatic. Neck:  Supple; no masses or thyromegaly. Lungs:  Clear throughout to auscultation, normal respiratory effort.    Heart:  +S1, +S2, Regular rate and rhythm, No edema. Abdomen:  Soft, nontender and nondistended. Normal bowel sounds, without guarding, and without rebound.   Neurologic:  Alert and  oriented x4;  grossly normal  neurologically.  Impression/Plan: Edwin Woodward is here for an endoscopy and colonoscopy  to be performed for  evaluation of GI bleed    Risks, benefits, limitations, and alternatives regarding endoscopy have been reviewed with the patient.  Questions have been answered.  All parties agreeable.  [name], MD  09/14/2018, 9:34 AM

## 2018-09-14 NOTE — Transfer of Care (Signed)
Immediate Anesthesia Transfer of Care Note  Patient: Edwin Woodward  Procedure(s) Performed: COLONOSCOPY WITH PROPOFOL (N/A ) ESOPHAGOGASTRODUODENOSCOPY (EGD) WITH PROPOFOL (N/A )  Patient Location: Endoscopy Unit  Anesthesia Type:General  Level of Consciousness: awake and patient cooperative  Airway & Oxygen Therapy: Patient Spontanous Breathing and Patient connected to nasal cannula oxygen  Post-op Assessment: Report given to RN and Post -op Vital signs reviewed and stable  Post vital signs: stable  Last Vitals:  Vitals Value Taken Time  BP 115/77 09/14/2018 10:32 AM  Temp 36.1 C 09/14/2018 10:31 AM  Pulse 57 09/14/2018 10:33 AM  Resp 17 09/14/2018 10:33 AM  SpO2 100 % 09/14/2018 10:33 AM  Vitals shown include unvalidated device data.  Last Pain:  Vitals:   09/14/18 1031  TempSrc: Tympanic  PainSc:          Complications: No apparent anesthesia complications

## 2018-09-14 NOTE — Op Note (Signed)
[facility] Gastroenterology Patient Name: Edwin Woodward Procedure Date: 09/14/2018 9:54 AM MRN: [id number] Account #: [id number] Date of Birth: 11/30/1950 Admit Type: Outpatient Age: 68 Room: [facility] ENDO ROOM 1 Gender: Male Note Status: Finalized Procedure:            Colonoscopy Indications:          Gastrointestinal occult blood loss Providers:            [name] MD, MD Referring MD:         Deborra Medina, MD (Referring MD) Medicines:            Monitored Anesthesia Care Complications:        No immediate complications. Procedure:            Pre-Anesthesia Assessment:                       - Prior to the procedure, a History and Physical was                        performed, and patient medications, allergies and                        sensitivities were reviewed. The patient's tolerance of                        previous anesthesia was reviewed.                       - The risks and benefits of the procedure and the                        sedation options and risks were discussed with the                        patient. All questions were answered and informed                        consent was obtained.                       - ASA Grade Assessment: II - A patient with mild                        systemic disease.                       After obtaining informed consent, the colonoscope was                        passed under direct vision. Throughout the procedure,                        the patient's blood pressure, pulse, and oxygen                        saturations were monitored continuously. The                        Colonoscope was introduced through the anus and  advanced to the the cecum, identified by the                        appendiceal orifice, IC valve and transillumination.                        The colonoscopy was performed with ease. The patient                        tolerated the procedure well. The quality of the  bowel                        preparation was adequate. Findings:      The perianal and digital rectal examinations were normal.      Two sessile polyps were found in the transverse colon and cecum. The       polyps were 4 to 7 mm in size. These polyps were removed with a cold       snare. Resection and retrieval were complete.      The exam was otherwise without abnormality on direct and retroflexion       views. Impression:           - Two 4 to 7 mm polyps in the transverse colon and in                        the cecum, removed with a cold snare. Resected and                        retrieved.                       - The examination was otherwise normal on direct and                        retroflexion views. Recommendation:       - Discharge patient to home (with escort).                       - Resume previous diet.                       - Continue present medications.                       - Await pathology results.                       - Repeat colonoscopy in 5 years for surveillance based                        on pathology results.                       - Return to my office as previously scheduled. Procedure Code(s):    --- Professional ---                       [phone], Colonoscopy, flexible; with removal of tumor(s),                        polyp(s), or other lesion(s) by snare technique Diagnosis Code(s):    ---  Professional ---                       D12.3, Benign neoplasm of transverse colon (hepatic                        flexure or splenic flexure)                       D12.0, Benign neoplasm of cecum                       R19.5, Other fecal abnormalities CPT copyright 2018 American Medical Association. All rights reserved. The codes documented in this report are preliminary and upon coder review may  be revised to meet current compliance requirements. [name], MD [name] MD, MD 09/14/2018 10:28:49 AM This report has been signed electronically. Number of Addenda:  0 Note Initiated On: 09/14/2018 9:54 AM Scope Withdrawal Time: 0 hours 13 minutes 58 seconds  Total Procedure Duration: 0 hours 15 minutes 58 seconds       [facility]

## 2018-09-14 NOTE — Anesthesia Post-op Follow-up Note (Signed)
Anesthesia QCDR form completed.        

## 2018-09-14 NOTE — Anesthesia Preprocedure Evaluation (Addendum)
Anesthesia Evaluation  Patient identified by MRN, date of birth, ID band Patient awake    Reviewed: Allergy & Precautions, H&P , NPO status , Patient's Chart, lab work & pertinent test results  Airway Mallampati: III       Dental  (+) Missing   Pulmonary neg shortness of breath, sleep apnea , neg COPD, neg recent URI, former smoker,           Cardiovascular hypertension, (-) angina+ CAD and + Cardiac Stents (2009)    Echo 06/26/18: - Left ventricle: The cavity size was normal. There was mild   concentric hypertrophy. Systolic function was normal. The   estimated ejection fraction was in the range of 55% to 60%. Wall   motion was normal; there were no regional wall motion   abnormalities. The study is not technically sufficient to allow   evaluation of LV diastolic function. - Pulmonary arteries: Systolic pressure could not be accurately   estimated.    Neuro/Psych PSYCHIATRIC DISORDERS Anxiety negative neurological ROS     GI/Hepatic Neg liver ROS, GERD  Controlled,  Endo/Other  negative endocrine ROS  Renal/GU CRFRenal disease  negative genitourinary   Musculoskeletal  (+) Arthritis ,   Abdominal   Peds  Hematology  (+) Blood dyscrasia, anemia ,   Anesthesia Other Findings Past Medical History: No date: CAD (coronary artery disease) No date: Cardiovascular stress test abnormal     Comment:  Jan 2010 no significant ischemia No date: Hyperlipidemia No date: Hypertension No date: Presence of bare metal stent in right coronary artery     Comment:  proximal RCA adn LAD in April 2009 No date: Trigger finger  Past Surgical History: No date: APPENDECTOMY No date: APPENDECTOMY No date: CARPAL TUNNEL RELEASE No date: CATARACT EXTRACTION     Comment:  right 06/30/2018: COLONOSCOPY WITH PROPOFOL; N/A     Comment:  Procedure: COLONOSCOPY WITH PROPOFOL;  Surgeon: [name], MD;  Location: [facility]  ENDOSCOPY;  Service:               Gastroenterology;  Laterality: N/A; 12/2007: CORONARY ANGIOPLASTY No date: CORONARY ARTERY BYPASS GRAFT     Comment:  2 vessel 06/29/2018: ESOPHAGOGASTRODUODENOSCOPY (EGD) WITH PROPOFOL; N/A     Comment:  Procedure: ESOPHAGOGASTRODUODENOSCOPY (EGD) WITH               PROPOFOL;  Surgeon: [name], MD;  Location: [facility]               ENDOSCOPY;  Service: Gastroenterology;  Laterality: N/A; No date: EYE SURGERY No date: JOINT REPLACEMENT     Comment:  total knee No date: REPLACEMENT TOTAL KNEE No date: TONSILLECTOMY No date: VASECTOMY  BMI    Body Mass Index:  31.76 kg/m      Reproductive/Obstetrics negative OB ROS                            Anesthesia Physical Anesthesia Plan  ASA: III  Anesthesia Plan: General   Post-op Pain Management:    Induction:   PONV Risk Score and Plan: Propofol infusion and TIVA  Airway Management Planned: Natural Airway and Nasal Cannula  Additional Equipment:   Intra-op Plan:   Post-operative Plan:   Informed Consent: I have reviewed the patients History and Physical, chart, labs and discussed the procedure including the risks, benefits and alternatives for the proposed anesthesia  with the patient or authorized representative who has indicated his/her understanding and acceptance.   Dental Advisory Given  Plan Discussed with: Anesthesiologist, CRNA and Surgeon  Anesthesia Plan Comments:         Anesthesia Quick Evaluation

## 2018-09-14 NOTE — Op Note (Signed)
[facility] Gastroenterology Patient Name: Edwin Woodward Procedure Date: 09/14/2018 9:55 AM MRN: [id number] Account #: [id number] Date of Birth: Jan 28, 1951 Admit Type: Outpatient Age: 68 Room: [facility] ENDO ROOM 1 Gender: Male Note Status: Finalized Procedure:            Upper GI endoscopy Indications:          Recent gastrointestinal bleeding Providers:            [name] MD, MD Referring MD:         Deborra Medina, MD (Referring MD) Medicines:            Monitored Anesthesia Care Complications:        No immediate complications. Procedure:            Pre-Anesthesia Assessment:                       - Prior to the procedure, a History and Physical was                        performed, and patient medications, allergies and                        sensitivities were reviewed. The patient's tolerance of                        previous anesthesia was reviewed.                       - The risks and benefits of the procedure and the                        sedation options and risks were discussed with the                        patient. All questions were answered and informed                        consent was obtained.                       - ASA Grade Assessment: II - A patient with mild                        systemic disease.                       After obtaining informed consent, the endoscope was                        passed under direct vision. Throughout the procedure,                        the patient's blood pressure, pulse, and oxygen                        saturations were monitored continuously. The Endoscope                        was introduced through the mouth, and advanced to the  third part of duodenum. The upper GI endoscopy was                        accomplished with ease. The patient tolerated the                        procedure well. Findings:      The cardia and gastric fundus were normal on retroflexion.      The  stomach was normal.      The esophagus was normal.      Patchy moderate inflammation characterized by congestion (edema) and       erythema was found in the duodenal bulb. Biopsies were taken with a cold       forceps for histology. Impression:           - Normal stomach.                       - Normal esophagus.                       - Duodenitis. Biopsied. Recommendation:       - Await pathology results.                       - Perform a colonoscopy today. Procedure Code(s):    --- Professional ---                       [phone], Esophagogastroduodenoscopy, flexible, transoral;                        with biopsy, single or multiple Diagnosis Code(s):    --- Professional ---                       K29.80, Duodenitis without bleeding                       K92.2, Gastrointestinal hemorrhage, unspecified CPT copyright 2018 American Medical Association. All rights reserved. The codes documented in this report are preliminary and upon coder review may  be revised to meet current compliance requirements. [name], MD [name] MD, MD 09/14/2018 10:04:42 AM This report has been signed electronically. Number of Addenda: 0 Note Initiated On: 09/14/2018 9:55 AM      [facility]

## 2018-09-15 ENCOUNTER — Encounter: Payer: Self-pay | Admitting: Gastroenterology

## 2018-09-15 LAB — SURGICAL PATHOLOGY

## 2018-09-15 NOTE — Anesthesia Postprocedure Evaluation (Signed)
Anesthesia Post Note  Patient: Edwin Woodward  Procedure(s) Performed: COLONOSCOPY WITH PROPOFOL (N/A ) ESOPHAGOGASTRODUODENOSCOPY (EGD) WITH PROPOFOL (N/A )  Patient location during evaluation: PACU Anesthesia Type: General Level of consciousness: awake and alert Pain management: pain level controlled Vital Signs Assessment: post-procedure vital signs reviewed and stable Respiratory status: spontaneous breathing, nonlabored ventilation and respiratory function stable Cardiovascular status: blood pressure returned to baseline and stable Postop Assessment: no apparent nausea or vomiting Anesthetic complications: no     Last Vitals:  Vitals:   09/14/18 0907 09/14/18 1031  BP:  115/77  Pulse: 64   Resp: 18   Temp: (!) 35.7 C (!) 36.1 C  SpO2: 96%     Last Pain:  Vitals:   09/14/18 1051  TempSrc:   PainSc: 0-No pain                 Durenda Hurt

## 2018-10-05 ENCOUNTER — Encounter: Payer: Self-pay | Admitting: Gastroenterology

## 2018-11-02 ENCOUNTER — Encounter: Payer: Self-pay | Admitting: Cardiovascular Disease

## 2018-11-02 ENCOUNTER — Ambulatory Visit: Payer: Medicare HMO | Admitting: Cardiovascular Disease

## 2018-11-02 VITALS — BP 110/68 | HR 86 | Ht 63.0 in | Wt 184.0 lb

## 2018-11-02 DIAGNOSIS — I2510 Atherosclerotic heart disease of native coronary artery without angina pectoris: Secondary | ICD-10-CM

## 2018-11-02 NOTE — Patient Instructions (Signed)

## 2018-11-02 NOTE — Progress Notes (Signed)
Cardiology Office Note  Date:  11/02/2018   ID:  Helmuth, Recupero [birth year], MRN [id number]  PCP:  [name], MD   Chief Complaint  Patient presents with  . Other    3 month f/u no complaints today. Meds reviewed verbally with pt.    HPI:  [name] is a very pleasant 68 y.o. gentleman who has a history of  coronary artery disease,  bare-metal stents placed to his proximal RCA and LAD in April 2009, Gout episodes x 2 (2015, 2016) total knee replacements bilaterally,  hypertension,  hyperlipidemia,  anxiety ADHD  Frontal lobe dementia who presents for routine followup of his coronary artery disease  INTERVAL HISTORY: The patient reports today for follow up. He is accompanied by his ex-wife  He reports he is doing well, but has had a virus the past 2-3 weeks which has made him feel sick. Endorses in the morning he has been having diarrhea. His magnesium and diet may be worsening his symptoms. His symptoms improve throughout the day. He has been drinking a lot of fluids to stay hydrated. Does not feel any chest pain during his exercise. After being hospitalized In October 2019 he stopped taking Plavix and recently started taking aspirin. He also stopped taking Protonix.     The stress test performed October 2019 showed no ischemia  He still continues going on walks, playing the drums, and doing leg lifts as exercise. His diet includes black bean and veggie burgers.    Blood pressure 110/68 CR 1.04 Glucose 83  EKG personally reviewed by myself on todays visit Shows Sinus rhythm with occasional premature ventricular complexes. 86 bpm. Left anterior fascicular block. Bifsacicular block.   EKG has looked the same for the past 10 years.    OTHER PAST MEDICAL HISTORY REVIEWED BY ME FOR TODAY'S VISIT:   PMH:   has a past medical history of CAD (coronary artery disease), Cardiovascular stress test abnormal, Hyperlipidemia, Hypertension, Presence of bare metal stent  in right coronary artery, and Trigger finger.  PSH:    Past Surgical History:  Procedure Laterality Date  . APPENDECTOMY    . APPENDECTOMY    . CARPAL TUNNEL RELEASE    . CATARACT EXTRACTION     right  . COLONOSCOPY WITH PROPOFOL N/A 06/30/2018   Procedure: COLONOSCOPY WITH PROPOFOL;  Surgeon: [name], MD;  Location: [facility] ENDOSCOPY;  Service: Gastroenterology;  Laterality: N/A;  . COLONOSCOPY WITH PROPOFOL N/A 09/14/2018   Procedure: COLONOSCOPY WITH PROPOFOL;  Surgeon: [name], MD;  Location: [facility] ENDOSCOPY;  Service: Gastroenterology;  Laterality: N/A;  . CORONARY ANGIOPLASTY  12/2007  . CORONARY ARTERY BYPASS GRAFT     2 vessel  . ESOPHAGOGASTRODUODENOSCOPY (EGD) WITH PROPOFOL N/A 06/29/2018   Procedure: ESOPHAGOGASTRODUODENOSCOPY (EGD) WITH PROPOFOL;  Surgeon: [name], MD;  Location: [facility] ENDOSCOPY;  Service: Gastroenterology;  Laterality: N/A;  . ESOPHAGOGASTRODUODENOSCOPY (EGD) WITH PROPOFOL N/A 09/14/2018   Procedure: ESOPHAGOGASTRODUODENOSCOPY (EGD) WITH PROPOFOL;  Surgeon: [name], MD;  Location: [facility] ENDOSCOPY;  Service: Gastroenterology;  Laterality: N/A;  . EYE SURGERY    . JOINT REPLACEMENT     total knee  . REPLACEMENT TOTAL KNEE    . TONSILLECTOMY    . VASECTOMY      Current Outpatient Medications  Medication Sig Dispense Refill  . alfuzosin (UROXATRAL) 10 MG 24 hr tablet Take 1 tablet (10 mg total) by mouth daily. 90 tablet 1  . Ascorbic Acid (VITAMIN C) 1000 MG tablet Take 1,000 mg by  mouth daily.    Marland Kitchen aspirin EC 81 MG tablet Take 81 mg by mouth daily.    Marland Kitchen atorvastatin (LIPITOR) 40 MG tablet Take 1 tablet (40 mg total) by mouth daily at 6 PM. Take 40 mg alt with 20 mg daily. 90 tablet 2  . buPROPion (WELLBUTRIN SR) 150 MG 12 hr tablet TAKE 1 TABLET BY MOUTH TWICE A DAY 60 tablet 6  . calcium carbonate 1250 MG capsule Take 1,250 mg by mouth 2 (two) times daily with a meal.    . citalopram (CELEXA) 20 MG tablet Take 30 mg by mouth daily.    .  Cyanocobalamin (B-12 PO) Take by mouth daily.    Marland Kitchen donepezil (ARICEPT) 5 MG tablet Take 5 mg by mouth 2 (two) times daily.     . finasteride (PROSCAR) 5 MG tablet Take 5 mg by mouth daily.    . magnesium oxide (MAG-OX) 400 MG tablet TAKE 1 TABLET (400 MG TOTAL) BY MOUTH 2 (TWO) TIMES DAILY AFTER A MEAL (Patient taking differently: Take 400 mg by mouth 2 (two) times daily. ) 60 tablet 3  . nitroGLYCERIN (NITROSTAT) 0.4 MG SL tablet Place 1 tablet (0.4 mg total) under the tongue every 5 (five) minutes as needed. 90 tablet 3  . olopatadine (PATANOL) 0.1 % ophthalmic solution Place 1 drop into both eyes 2 (two) times daily. (Patient taking differently: Place 1 drop into both eyes as needed. ) 5 mL 12  . risperiDONE (RISPERDAL) 0.25 MG tablet Take 0.25 mg by mouth 2 (two) times daily.     Marland Kitchen zinc gluconate 50 MG tablet Take 50 mg by mouth daily.     No current facility-administered medications for this visit.      Allergies:   Biaxin [clarithromycin]; Clarithromycin; Codeine; Penicillins; Promethazine; Iodinated diagnostic agents; and Metrizamide   Social History:  The patient  reports that he has quit smoking. He has never used smokeless tobacco. He reports that he does not drink alcohol or use drugs.   Family History:   family history includes Diabetes in his mother; Heart disease in his father; Hypertension in his mother; Thalassemia in his mother.    Review of Systems: Review of Systems  Constitutional: Negative.   Eyes: Negative.   Respiratory: Negative.   Cardiovascular: Negative.  Negative for chest pain.  Gastrointestinal: Positive for diarrhea.  Genitourinary: Negative.   Musculoskeletal: Negative.   Neurological: Negative.   Psychiatric/Behavioral: Negative.   All other systems reviewed and are negative.    PHYSICAL EXAM: VS:  BP 110/68 (BP Location: Left Arm, Patient Position: Sitting, Cuff Size: Normal)   Pulse 86   Ht 5\' 3"  (1.6 m)   Wt 184 lb (83.5 kg)   BMI 32.59  kg/m  , BMI Body mass index is 32.59 kg/m.  Constitutional:  oriented to person, place, and time. No distress.  HENT:  Head: Grossly normal Eyes:  no discharge. No scleral icterus.   Neck: No JVD, no carotid bruits  Cardiovascular: Regular rate and rhythm, no murmurs appreciated Pulmonary/Chest: Clear to auscultation bilaterally, no wheezes or rales Abdominal: Soft.  no distension.  no tenderness.  Musculoskeletal: Normal range of motion Neurological:  normal muscle tone. Coordination normal. No atrophy Skin: Skin warm and dry Psychiatric: normal affect, pleasant  Recent Labs: 06/25/2018: B Natriuretic Peptide 17.0; TSH 1.031 06/27/2018: Magnesium 1.9 07/05/2018: ALT 46; BUN 18; Creatinine, Ser 1.04; Potassium 3.7; Sodium 139 08/09/2018: Hemoglobin 12.2; Platelets 308    Lipid Panel Lab Results  Component  Value Date   CHOL 104 11/14/2016   HDL 35 (L) 11/14/2016   LDLCALC 42 11/14/2016   TRIG 75 06/25/2018      Wt Readings from Last 3 Encounters:  11/02/18 184 lb (83.5 kg)  09/14/18 185 lb (83.9 kg)  08/16/18 187 lb 1.6 oz (84.9 kg)       ASSESSMENT AND PLAN:  Pure hypercholesterolemia No recent lipid panels available, previously well controlled  Hypotension/orthostasis Blood pressure running low, not on any blood pressure medications Denies any orthostasis symptoms Recommended he stay hydrated  Atherosclerosis of native coronary artery without angina pectoris, unspecified whether native or transplanted heart Exercising without any anginal symptoms, Recent stress test with no ischemia No further work-up at this time  Class 1 obesity due to excess calories with serious comorbidity and body mass index (BMI) of 31.0 to 31.9 in adult Plan: Weight stable but does regular exercise Recommended he continue his aggressive walking regimen  Bradycardia Plan: Resolved after stopping beta-blocker  GAD (generalized anxiety disorder) Plan: Seems to be doing better on a  regular walking program Has significant family supports  CKD (chronic kidney disease) stage 3, GFR 30-59 ml/min Plan: EKG 12-Lead,  stable renal function  Memory loss Plan: Still not driving, requires assistance from ex-wife,  friends and family Reports symptoms are stable  Disposition:   F/U 12 months   Total encounter time more than 25 minutes  Greater than 50% was spent in counseling and coordination of care with the patient    Orders Placed This Encounter  Procedures  . EKG 12-Lead   I, Jesus Reyes am acting as a scribe for Ida Rogue, M.D., Ph.D.  I, Ida Rogue, M.D. Ph.D., have reviewed the above documentation for accuracy and completeness, and I agree with the above.   Signed, Esmond Plants, M.D., Ph.D. 11/02/2018  [place], [place]

## 2018-11-09 ENCOUNTER — Ambulatory Visit: Payer: Medicare HMO | Admitting: Gastroenterology

## 2018-11-09 ENCOUNTER — Encounter: Payer: Self-pay | Admitting: Gastroenterology

## 2018-11-09 VITALS — BP 125/75 | HR 64 | Ht 63.0 in | Wt 187.2 lb

## 2018-11-09 DIAGNOSIS — K259 Gastric ulcer, unspecified as acute or chronic, without hemorrhage or perforation: Secondary | ICD-10-CM | POA: Diagnosis not present

## 2018-11-09 NOTE — Progress Notes (Signed)
[name] MD, MRCP(U.K) [address]  [place]  [place], [place] [address]  Main: [phone]  Fax: [phone]   Primary Care Physician: [name], MD  Primary Gastroenterologist:  Dr. [name]   Chief Complaint  Patient presents with  . Follow-up    Iron deficiency Anemia    HPI: Edwin Woodward is a 68 y.o. male   Summary of history :  He is here today for follow up for iron deficiency anemia likely secondary to long term NSAID use,    He was admitted on 06/15/2018 for an upper GI bleed.    He was intubated put on pressors and admitted.  NG tube showed dark thick material coming out.  Hemoglobin was  was 10.6 g. Hospital admission was complicated by Pneumonia. Labs suggested iron deficiency anemia with possible anemia of chronic disease component. Normal b12 and folate.  06/29/18:  EGD:  Two non-bleeding superficial gastric ulcers with a clean ulcer base (Forrest Class III) were found in the prepyloric region of the stomach. The largest lesion was 5 mm in largest dimension.He says he was taking a lot of Advil for a month prior- 1-2 tablets daily. Since then has stopped  Colonoscopy 06/30/18 : poor prep: single solitary rectal ulcer seen non bleeding    Interval history 08/09/2018-11/09/2018  08/09/2018:iron studies normal , Hb 12.2 with MCV 71  09/14/2018:EGD-duodenitis on bx , tubular adenoma seen on colonoscopy that was resected .  At this time his stool is brown, no new issues , feels well. Off PPI.    Current Outpatient Medications  Medication Sig Dispense Refill  . alfuzosin (UROXATRAL) 10 MG 24 hr tablet Take 1 tablet (10 mg total) by mouth daily. 90 tablet 1  . Ascorbic Acid (VITAMIN C) 1000 MG tablet Take 1,000 mg by mouth daily.    Marland Kitchen aspirin EC 81 MG tablet Take 81 mg by mouth daily.    Marland Kitchen atorvastatin (LIPITOR) 40 MG tablet Take 1 tablet (40 mg total) by mouth daily at 6 PM. Take 40 mg alt with 20 mg daily. 90 tablet 2  . buPROPion  (WELLBUTRIN SR) 150 MG 12 hr tablet TAKE 1 TABLET BY MOUTH TWICE A DAY 60 tablet 6  . calcium carbonate 1250 MG capsule Take 1,250 mg by mouth 2 (two) times daily with a meal.    . citalopram (CELEXA) 20 MG tablet Take 30 mg by mouth daily.    . Cyanocobalamin (B-12 PO) Take by mouth daily.    Marland Kitchen donepezil (ARICEPT) 5 MG tablet Take 5 mg by mouth 2 (two) times daily.     . finasteride (PROSCAR) 5 MG tablet Take 5 mg by mouth daily.    . magnesium oxide (MAG-OX) 400 MG tablet TAKE 1 TABLET (400 MG TOTAL) BY MOUTH 2 (TWO) TIMES DAILY AFTER A MEAL (Patient taking differently: Take 400 mg by mouth 2 (two) times daily. ) 60 tablet 3  . nitroGLYCERIN (NITROSTAT) 0.4 MG SL tablet Place 1 tablet (0.4 mg total) under the tongue every 5 (five) minutes as needed. 90 tablet 3  . olopatadine (PATANOL) 0.1 % ophthalmic solution Place 1 drop into both eyes 2 (two) times daily. (Patient taking differently: Place 1 drop into both eyes as needed. ) 5 mL 12  . risperiDONE (RISPERDAL) 0.25 MG tablet Take 0.25 mg by mouth 2 (two) times daily.     Marland Kitchen zinc gluconate 50 MG tablet Take 50 mg by mouth daily.     No  current facility-administered medications for this visit.     Allergies as of 11/09/2018 - Review Complete 11/09/2018  Allergen Reaction Noted  . Biaxin [clarithromycin]    . Clarithromycin  05/13/2011  . Codeine    . Penicillins    . Promethazine  10/05/2013  . Iodinated diagnostic agents Rash 03/21/2013  . Metrizamide Rash 03/21/2013    ROS:  General: Negative for anorexia, weight loss, fever, chills, fatigue, weakness. ENT: Negative for hoarseness, difficulty swallowing , nasal congestion. CV: Negative for chest pain, angina, palpitations, dyspnea on exertion, peripheral edema.  Respiratory: Negative for dyspnea at rest, dyspnea on exertion, cough, sputum, wheezing.  GI: See history of present illness. GU:  Negative for dysuria, hematuria, urinary incontinence, urinary frequency, nocturnal  urination.  Endo: Negative for unusual weight change.    Physical Examination:   BP 125/75   Pulse 64   Ht 5\' 3"  (1.6 m)   Wt 187 lb 3.2 oz (84.9 kg)   BMI 33.16 kg/m   General: Well-nourished, well-developed in no acute distress.  Eyes: No icterus. Conjunctivae pink. Mouth: Oropharyngeal mucosa moist and pink , no lesions erythema or exudate. Lungs: Clear to auscultation bilaterally. Non-labored. Heart: Regular rate and rhythm, no murmurs rubs or gallops.  Abdomen: Bowel sounds are normal, nontender, nondistended, no hepatosplenomegaly or masses, no abdominal bruits or hernia , no rebound or guarding.   Extremities: No lower extremity edema. No clubbing or deformities. Neuro: Alert and oriented x 3.  Grossly intact. Skin: Warm and dry, no jaundice.   Psych: Alert and cooperative, normal mood and affect.   Imaging Studies: No results found.  Assessment and Plan:   Edwin Woodward is a 68 y.o. y/o male here to follow up for upper GI bleed secondary to gastric ulcers. Labs suggest a component of iron deficiency. Colonoscopy showed a solitary rectal ulcer. Likely NSAID related ulcer. Not on any iron , labs improving   Plan  1.Can restart Asprin if needed , no NSAID's 2. Check CBC today - if stable follow up with PCP 3. H pylori abreath test   Dr [name]  MD,MRCP [facility]) Follow up PRN

## 2018-11-10 LAB — CBC WITH DIFFERENTIAL/PLATELET
Basophils Absolute: 0 10*3/uL (ref 0.0–0.2)
Basos: 0 %
EOS (ABSOLUTE): 0.2 10*3/uL (ref 0.0–0.4)
Eos: 3 %
Hematocrit: 36.6 % — ABNORMAL LOW (ref 37.5–51.0)
Hemoglobin: 12.2 g/dL — ABNORMAL LOW (ref 13.0–17.7)
Immature Grans (Abs): 0 10*3/uL (ref 0.0–0.1)
Immature Granulocytes: 0 %
LYMPHS ABS: 2.2 10*3/uL (ref 0.7–3.1)
Lymphs: 30 %
MCH: 21.1 pg — ABNORMAL LOW (ref 26.6–33.0)
MCHC: 33.3 g/dL (ref 31.5–35.7)
MCV: 63 fL — ABNORMAL LOW (ref 79–97)
Monocytes Absolute: 0.7 10*3/uL (ref 0.1–0.9)
Monocytes: 10 %
Neutrophils Absolute: 4.3 10*3/uL (ref 1.4–7.0)
Neutrophils: 57 %
Platelets: 279 10*3/uL (ref 150–450)
RBC: 5.78 x10E6/uL (ref 4.14–5.80)
RDW: 17.8 % — ABNORMAL HIGH (ref 11.6–15.4)
WBC: 7.5 10*3/uL (ref 3.4–10.8)

## 2018-11-11 LAB — H. PYLORI BREATH TEST: H pylori Breath Test: NEGATIVE

## 2018-11-18 ENCOUNTER — Encounter: Payer: Self-pay | Admitting: Gastroenterology

## 2018-12-06 ENCOUNTER — Other Ambulatory Visit: Payer: Self-pay | Admitting: Cardiovascular Disease

## 2018-12-06 ENCOUNTER — Other Ambulatory Visit: Payer: Self-pay | Admitting: Internal Medicine

## 2018-12-17 ENCOUNTER — Other Ambulatory Visit: Payer: Self-pay | Admitting: Internal Medicine

## 2018-12-17 MED ORDER — FINASTERIDE 5 MG PO TABS: 5.0000 mg | ORAL_TABLET | Freq: Every day | ORAL | 0 refills | Status: DC

## 2019-01-04 ENCOUNTER — Other Ambulatory Visit: Payer: Self-pay

## 2019-01-04 ENCOUNTER — Ambulatory Visit (INDEPENDENT_AMBULATORY_CARE_PROVIDER_SITE_OTHER): Payer: Medicare HMO | Admitting: Internal Medicine

## 2019-01-04 DIAGNOSIS — G1220 Motor neuron disease, unspecified: Secondary | ICD-10-CM

## 2019-01-04 DIAGNOSIS — I10 Essential (primary) hypertension: Secondary | ICD-10-CM

## 2019-01-04 DIAGNOSIS — G3109 Other frontotemporal dementia: Secondary | ICD-10-CM | POA: Diagnosis not present

## 2019-01-04 DIAGNOSIS — M6281 Muscle weakness (generalized): Secondary | ICD-10-CM | POA: Diagnosis not present

## 2019-01-04 DIAGNOSIS — F0280 Dementia in other diseases classified elsewhere without behavioral disturbance: Secondary | ICD-10-CM

## 2019-01-04 DIAGNOSIS — R69 Illness, unspecified: Secondary | ICD-10-CM | POA: Diagnosis not present

## 2019-01-04 DIAGNOSIS — N138 Other obstructive and reflux uropathy: Secondary | ICD-10-CM | POA: Diagnosis not present

## 2019-01-04 DIAGNOSIS — N401 Enlarged prostate with lower urinary tract symptoms: Secondary | ICD-10-CM

## 2019-01-04 DIAGNOSIS — N182 Chronic kidney disease, stage 2 (mild): Secondary | ICD-10-CM | POA: Diagnosis not present

## 2019-01-04 MED ORDER — FINASTERIDE 5 MG PO TABS: 5.0000 mg | ORAL_TABLET | Freq: Every day | ORAL | 2 refills | Status: DC

## 2019-01-04 NOTE — Assessment & Plan Note (Addendum)
Managed with finasteride. Last urology evaluation was April  2019 with Dr Jacqlyn Larsen.  New local urologist needed. Referral made

## 2019-01-04 NOTE — Progress Notes (Signed)
Needs a referral for a new urologist and needs a refill on the finasteride until he can get in to see a urologist

## 2019-01-04 NOTE — Progress Notes (Signed)
Virtual Visit via [email or web address] This visit type was conducted due to national recommendations for restrictions regarding the COVID-19 pandemic (e.g. social distancing).  This format is felt to be most appropriate for this patient at this time.  All issues noted in this document were discussed and addressed.  No physical exam was performed (except for noted visual exam findings with Video Visits).   I connected with@ on 01/04/19 at  2:00 PM EDT by a video enabled telemedicine application  and verified that I am speaking with the correct person using two identifiers. Location patient: home Location provider: work or home office Persons participating in the virtual visit: patient, provider  I discussed the limitations, risks, security and privacy concerns of performing an evaluation and management service by telephone and the availability of in person appointments. I also discussed with the patient that there may be a patient responsible charge related to this service. The patient expressed understanding and agreed to proceed.  Reason for visit: follow up on multiple issues including FTD ,generalized weakness   HPI:  Diagnosed with gastric ulcers (2)  during  October 2019 hospitalization for  severe UGI bleed secondary to chronic  NSAID use.   Had repeat  EGD in January showing resolution of ulcers with small amount of  erythema.  Told to dc daily protonix and only take with NSAID use.. not using NSAIDS  The patient has no signs or symptoms of COVID 19 infection (fever, cough, sore throat  or shortness of breath beyond what is typical for patient).  Patient denies contact with other persons with the above mentioned symptoms or with anyone confirmed to have COVID 19 . He has not left the house in 4 weeks .  He has not been able to reach his prior level of endurance and strength despite exercising daily    Diet and activity level reviewed in detail.  CKD/Hypertension: patient checks blood pressure twice  weekly at home.  Readings have been for the most part < 140/80 at rest . Patient is following a reduce salt diet most days and is taking medications as prescribed  BPH:  Symptoms managed with finasteride. One nocturnal void per night.  Refills and urology referral needed.    OSA: Diagnosed prior to 2012 by sleep study. he is wearing her CPAP every night a minimum of 6 hours per night and notes improved daytime wakefulness and decreased fatigue   ROS: Patient denies headache, fevers, malaise, unintentional weight loss, skin rash, eye pain, sinus congestion and sinus pain, sore throat, dysphagia,  hemoptysis , cough, dyspnea, wheezing, chest pain, palpitations, orthopnea, edema, abdominal pain, nausea, melena, diarrhea, constipation, flank pain, dysuria, hematuria, urinary  Frequency, nocturia, numbness, tingling, seizures,  Focal weakness, Loss of consciousness,  Tremor, insomnia, depression, anxiety, and suicidal ideation.      Past Medical History:  Diagnosis Date  . CAD (coronary artery disease)   . Cardiovascular stress test abnormal    Jan 2010 no significant ischemia  . Hyperlipidemia   . Hypertension   . Presence of bare metal stent in right coronary artery    proximal RCA adn LAD in April 2009  . Trigger finger     Past Surgical History:  Procedure Laterality Date  . APPENDECTOMY    . APPENDECTOMY    . CARPAL TUNNEL RELEASE    . CATARACT EXTRACTION     right  . COLONOSCOPY WITH PROPOFOL N/A 06/30/2018   Procedure: COLONOSCOPY WITH PROPOFOL;  Surgeon: [name], MD;  Location: [facility]  ENDOSCOPY;  Service: Gastroenterology;  Laterality: N/A;  . COLONOSCOPY WITH PROPOFOL N/A 09/14/2018   Procedure: COLONOSCOPY WITH PROPOFOL;  Surgeon: [name], MD;  Location: [facility] ENDOSCOPY;  Service: Gastroenterology;  Laterality: N/A;  . CORONARY ANGIOPLASTY  12/2007  . CORONARY ARTERY BYPASS GRAFT     2 vessel  . ESOPHAGOGASTRODUODENOSCOPY (EGD) WITH PROPOFOL N/A 06/29/2018   Procedure:  ESOPHAGOGASTRODUODENOSCOPY (EGD) WITH PROPOFOL;  Surgeon: [name], MD;  Location: [facility] ENDOSCOPY;  Service: Gastroenterology;  Laterality: N/A;  . ESOPHAGOGASTRODUODENOSCOPY (EGD) WITH PROPOFOL N/A 09/14/2018   Procedure: ESOPHAGOGASTRODUODENOSCOPY (EGD) WITH PROPOFOL;  Surgeon: [name], MD;  Location: [facility] ENDOSCOPY;  Service: Gastroenterology;  Laterality: N/A;  . EYE SURGERY    . JOINT REPLACEMENT     total knee  . REPLACEMENT TOTAL KNEE    . TONSILLECTOMY    . VASECTOMY      Family History  Problem Relation Age of Onset  . Hypertension Mother   . Diabetes Mother   . Thalassemia Mother   . Heart disease Father     SOCIAL HX: married but separate from Wife Harmon Pier ,   Disabled secondary to FTD  .  Living with estranged wife and her partner.   Current Outpatient Medications:  .  alfuzosin (UROXATRAL) 10 MG 24 hr tablet, Take 1 tablet (10 mg total) by mouth daily., Disp: 90 tablet, Rfl: 1 .  Ascorbic Acid (VITAMIN C) 1000 MG tablet, Take 1,000 mg by mouth daily., Disp: , Rfl:  .  aspirin EC 81 MG tablet, Take 81 mg by mouth daily., Disp: , Rfl:  .  atorvastatin (LIPITOR) 40 MG tablet, TAKE 1 TABLET (40 MG TOTAL) BY MOUTH DAILY AT 6 PM. TAKE 40 MG ALT WITH 20 MG DAILY., Disp: 90 tablet, Rfl: 2 .  buPROPion (WELLBUTRIN SR) 150 MG 12 hr tablet, TAKE 1 TABLET BY MOUTH TWICE A DAY, Disp: 60 tablet, Rfl: 6 .  calcium carbonate 1250 MG capsule, Take 1,250 mg by mouth 2 (two) times daily with a meal., Disp: , Rfl:  .  Cranberry 1000 MG CAPS, Take 2 capsules by mouth daily., Disp: , Rfl:  .  Cyanocobalamin (B-12 PO), Take by mouth daily., Disp: , Rfl:  .  donepezil (ARICEPT) 5 MG tablet, Take 5 mg by mouth 2 (two) times daily. , Disp: , Rfl:  .  finasteride (PROSCAR) 5 MG tablet, Take 1 tablet (5 mg total) by mouth daily for 30 days., Disp: 30 tablet, Rfl: 2 .  magnesium oxide (MAG-OX) 400 MG tablet, TAKE 1 TABLET (400 MG TOTAL) BY MOUTH 2 (TWO) TIMES DAILY AFTER A MEAL, Disp: 60 tablet,  Rfl: 3 .  nitroGLYCERIN (NITROSTAT) 0.4 MG SL tablet, Place 1 tablet (0.4 mg total) under the tongue every 5 (five) minutes as needed., Disp: 90 tablet, Rfl: 3 .  olopatadine (PATANOL) 0.1 % ophthalmic solution, Place 1 drop into both eyes 2 (two) times daily. (Patient taking differently: Place 1 drop into both eyes as needed. ), Disp: 5 mL, Rfl: 12 .  risperiDONE (RISPERDAL) 0.25 MG tablet, Take 0.25 mg by mouth 2 (two) times daily. , Disp: , Rfl:  .  zinc gluconate 50 MG tablet, Take 50 mg by mouth daily., Disp: , Rfl:  .  citalopram (CELEXA) 20 MG tablet, Take 30 mg by mouth daily., Disp: , Rfl:   EXAM:  VITALS per patient if applicable:  GENERAL: alert, oriented, appears well and in no acute distress  HEENT: atraumatic, conjunttiva clear, no obvious abnormalities on inspection  of external nose and ears  NECK: normal movements of the head and neck  LUNGS: on inspection no signs of respiratory distress, breathing rate appears normal, no obvious gross SOB, gasping or wheezing  CV: no obvious cyanosis  MS: moves all visible extremities without noticeable abnormality  PSYCH/NEURO: pleasant and cooperative, no obvious depression or anxiety, speech and thought processing grossly intact  ASSESSMENT AND PLAN:  Discussed the following assessment and plan:  BPH with obstruction/lower urinary tract symptoms - Plan: Ambulatory referral to Urology  HYPERTENSION, BENIGN  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 Last urology evaluation was April  2019 with Dr Jacqlyn Larsen.  New local urologist needed. Referral made    HYPERTENSION, BENIGN Well controlled on current regimen. Renal function stable, no changes today.  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 He lives requires supervision and assistance by his estranged wife and her companion.    CKD stage G2/A1, 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 Managed with avoidance of NSAIDs and control of BP. Cr was normal at discharge  Lab Results  Component Value Date   CREATININE 1.04 07/05/2018      Generalized muscle weakness Encouraged patient to continue daily exercise with goal to strengthen leg muscles and core/balance to prevent falls     I discussed the assessment and treatment plan with the patient. The patient was provided an opportunity to ask questions and all were answered. The patient agreed with the plan and demonstrated an understanding of the instructions.   The patient was advised to call back or seek an in-person evaluation if the symptoms worsen or if the condition fails to improve as anticipated.  I provided 25 minutes of non-face-to-face time during this encounter.   [name], MD

## 2019-01-06 NOTE — Assessment & Plan Note (Signed)
Encouraged patient to continue daily exercise with goal to strengthen leg muscles and core/balance to prevent falls

## 2019-01-06 NOTE — Assessment & Plan Note (Signed)
Well controlled on current regimen. Renal function stable, no changes today.  Lab Results  Component Value Date   CREATININE 1.04 07/05/2018   Lab Results  Component Value Date   NA 139 07/05/2018   K 3.7 07/05/2018   CL 106 07/05/2018   CO2 23 07/05/2018

## 2019-01-06 NOTE — Assessment & Plan Note (Signed)
Managed with avoidance of NSAIDs and control of BP. Cr was normal at discharge  Lab Results  Component Value Date   CREATININE 1.04 07/05/2018

## 2019-01-06 NOTE — Assessment & Plan Note (Signed)
Mild, with good insight to his current medical condition. He lives requires supervision and assistance by his estranged wife and her companion.

## 2019-03-30 ENCOUNTER — Other Ambulatory Visit: Payer: Self-pay | Admitting: Internal Medicine

## 2019-04-04 ENCOUNTER — Other Ambulatory Visit: Payer: Self-pay | Admitting: Internal Medicine

## 2019-04-18 ENCOUNTER — Encounter: Payer: Self-pay | Admitting: Urology

## 2019-04-18 ENCOUNTER — Ambulatory Visit: Payer: Medicare HMO | Admitting: Urology

## 2019-04-18 ENCOUNTER — Other Ambulatory Visit: Payer: Self-pay

## 2019-04-18 VITALS — BP 106/63 | HR 68 | Ht 63.0 in | Wt 183.0 lb

## 2019-04-18 DIAGNOSIS — N401 Enlarged prostate with lower urinary tract symptoms: Secondary | ICD-10-CM

## 2019-04-18 DIAGNOSIS — N138 Other obstructive and reflux uropathy: Secondary | ICD-10-CM | POA: Diagnosis not present

## 2019-04-18 LAB — BLADDER SCAN AMB NON-IMAGING

## 2019-04-18 MED ORDER — ALFUZOSIN HCL ER 10 MG PO TB24: 10.0000 mg | ORAL_TABLET | Freq: Every day | ORAL | 3 refills | Status: DC

## 2019-04-18 MED ORDER — FINASTERIDE 5 MG PO TABS: 5.0000 mg | ORAL_TABLET | Freq: Every day | ORAL | 3 refills | Status: DC

## 2019-04-18 NOTE — Progress Notes (Signed)
04/18/19 5:02 PM   Edwin Woodward 1950/11/22 [id number]  Referring provider: [name], MD [address] [place],  [place] [address]  CC: BPH and urinary symptoms  HPI: I saw Edwin Woodward in clinic today in consultation from Edwin Woodward for BPH and urinary symptoms.  He is a 68 year old comorbid male with history of CAD, dementia with suspicion for Alzheimer's, and BPH.  He was previously followed by Edwin Woodward at [facility].  He reports his BPH and urinary symptoms are very well controlled on alfuzosin and finasteride.  He underwent a cystoscopy 18 months ago with Edwin Woodward that was notable for trilobar hyperplasia, median lobe, and high bladder neck.  PSAs have been within normal limit.  Prostate measures 50 g on CT from October 2019.  He also reports a 20+ year history of urinary dribbling and incontinence that is minimally bothersome to him.  He reports he does not have any urge or stress incontinence, he will just leak without realizing it.  There are no aggravating factors.  Severity is mild to moderate.  PVR in clinic is normal today at 30 cc.   PMH: Past Medical History:  Diagnosis Date  . CAD (coronary artery disease)   . Cardiovascular stress test abnormal    Jan 2010 no significant ischemia  . Hyperlipidemia   . Hypertension   . Presence of bare metal stent in right coronary artery    proximal RCA adn LAD in April 2009  . Trigger finger     Surgical History: Past Surgical History:  Procedure Laterality Date  . APPENDECTOMY    . APPENDECTOMY    . CARPAL TUNNEL RELEASE    . CATARACT EXTRACTION     right  . COLONOSCOPY WITH PROPOFOL N/A 06/30/2018   Procedure: COLONOSCOPY WITH PROPOFOL;  Surgeon: [name], MD;  Location: [facility] ENDOSCOPY;  Service: Gastroenterology;  Laterality: N/A;  . COLONOSCOPY WITH PROPOFOL N/A 09/14/2018   Procedure: COLONOSCOPY WITH PROPOFOL;  Surgeon: [name], MD;  Location: [facility] ENDOSCOPY;  Service: Gastroenterology;   Laterality: N/A;  . CORONARY ANGIOPLASTY  12/2007  . CORONARY ARTERY BYPASS GRAFT     2 vessel  . ESOPHAGOGASTRODUODENOSCOPY (EGD) WITH PROPOFOL N/A 06/29/2018   Procedure: ESOPHAGOGASTRODUODENOSCOPY (EGD) WITH PROPOFOL;  Surgeon: [name], MD;  Location: [facility] ENDOSCOPY;  Service: Gastroenterology;  Laterality: N/A;  . ESOPHAGOGASTRODUODENOSCOPY (EGD) WITH PROPOFOL N/A 09/14/2018   Procedure: ESOPHAGOGASTRODUODENOSCOPY (EGD) WITH PROPOFOL;  Surgeon: [name], MD;  Location: [facility] ENDOSCOPY;  Service: Gastroenterology;  Laterality: N/A;  . EYE SURGERY    . JOINT REPLACEMENT     total knee  . REPLACEMENT TOTAL KNEE    . TONSILLECTOMY    . VASECTOMY      Allergies:  Allergies  Allergen Reactions  . Biaxin [Clarithromycin]   . Clarithromycin   . Codeine     Sever vomiting   . Penicillins   . Promethazine   . Iodinated Diagnostic Agents Rash  . Metrizamide Rash    Family History: Family History  Problem Relation Age of Onset  . Hypertension Mother   . Diabetes Mother   . Thalassemia Mother   . Heart disease Father     Social History:  reports that he has quit smoking. He has never used smokeless tobacco. He reports that he does not drink alcohol or use drugs.  ROS: Please see flowsheet from today's date for complete review of systems.  Physical Exam: BP 106/63   Pulse 68   Ht  5\' 3"  (1.6 m)   Wt 183 lb (83 kg)   BMI 32.42 kg/m    Constitutional:  No acute distress. Cardiovascular: No clubbing, cyanosis, or edema. Respiratory: Normal respiratory effort, no increased work of breathing. GI: Abdomen is soft, nontender, nondistended, no abdominal masses GU: No CVA tenderness Lymph: No cervical or inguinal lymphadenopathy. Skin: No rashes, bruises or suspicious lesions. Neurologic: Grossly intact, no focal deficits, moving all 4 extremities. Psychiatric: Normal mood and affect.  Laboratory Data: Reviewed  Pertinent Imaging: Reviewed, no hydronephrosis, prostate  measures 50 g  Assessment & Plan:   In summary, the patient is a Woodward-morbid 68 year old male with well-controlled BPH and urinary symptoms on maximal medical therapy with alfuzosin and finasteride.  We discussed BPH and his symptoms at length as well as treatment options.  I recommended considering a Edwin Woodward clamp for his urinary leakage.  RTC 1 year for symptom check Alfuzosin and finasteride refilled Does not need routine PSA screening with his Woodward-morbidities  Edwin Co, MD  [facility] [address], [place], [place] [address] [phone]

## 2019-06-28 ENCOUNTER — Telehealth: Payer: Self-pay | Admitting: Internal Medicine

## 2019-06-28 NOTE — Telephone Encounter (Signed)
Placed in red folder  

## 2019-06-28 NOTE — Telephone Encounter (Signed)
Pt wife dropped off Disability Parking Placard form that needs to be signed. Form is up front in color folder. Please call wife once done for pick up.  Thank you!

## 2019-06-29 NOTE — Telephone Encounter (Signed)
Pt's wife is aware that form has been completed and placed up front for pick up.

## 2019-06-30 ENCOUNTER — Ambulatory Visit (INDEPENDENT_AMBULATORY_CARE_PROVIDER_SITE_OTHER): Payer: Medicare HMO

## 2019-06-30 ENCOUNTER — Other Ambulatory Visit: Payer: Self-pay

## 2019-06-30 DIAGNOSIS — Z23 Encounter for immunization: Secondary | ICD-10-CM | POA: Diagnosis not present

## 2019-09-12 ENCOUNTER — Ambulatory Visit: Payer: Medicare HMO | Attending: Internal Medicine

## 2019-09-12 DIAGNOSIS — Z20822 Contact with and (suspected) exposure to covid-19: Secondary | ICD-10-CM

## 2019-09-13 LAB — NOVEL CORONAVIRUS, NAA: SARS-CoV-2, NAA: NOT DETECTED

## 2019-09-14 ENCOUNTER — Ambulatory Visit: Payer: Medicare HMO | Admitting: Internal Medicine

## 2019-09-14 ENCOUNTER — Telehealth: Payer: Self-pay | Admitting: *Deleted

## 2019-09-14 NOTE — Telephone Encounter (Signed)
Patient's wife called ,given negative results.

## 2019-10-28 ENCOUNTER — Other Ambulatory Visit: Payer: Self-pay | Admitting: Cardiovascular Disease

## 2019-12-05 NOTE — Progress Notes (Signed)
Cardiology Office Note  Date:  12/06/2019   ID:  Edwin Woodward, Edwin Woodward [birth year], MRN [id number]  PCP:  [name], MD   Chief Complaint  Patient presents with  . other    12 month follow up. Meds reviewed by the pt. verbally. "doing well."     HPI:  Edwin Woodward is a very pleasant 69 y.o. gentleman who has a history of  coronary artery disease,  bare-metal stents placed to his proximal RCA and LAD in April 2009, Gout episodes x 2 (2015, 2016) total knee replacements bilaterally,  hypertension,  hyperlipidemia,  anxiety ADHD  Frontal lobe dementia who presents for routine followup of his coronary artery disease    accompanied by his ex-wife In follow-up today reports he is doing well Exercising on a regular basis Weight is stable Denies any chest pain concerning for angina No near syncope, tachycardia, palpitations No PND orthopnea Continues to play on the drums Family helps with his food/diet   stress test performed October 2019 showed no ischemia  Stable BMP  EKG personally reviewed by myself on todays visit Shows Sinus rhythm with occasional premature ventricular complexes. 86 bpm. Left anterior fascicular block. Bifsacicular block.  EKG has looked the same for the past 10 years.      PMH:   has a past medical history of CAD (coronary artery disease), Cardiovascular stress test abnormal, Hyperlipidemia, Hypertension, Presence of bare metal stent in right coronary artery, and Trigger finger.  PSH:    Past Surgical History:  Procedure Laterality Date  . APPENDECTOMY    . APPENDECTOMY    . CARPAL TUNNEL RELEASE    . CATARACT EXTRACTION     right  . COLONOSCOPY WITH PROPOFOL N/A 06/30/2018   Procedure: COLONOSCOPY WITH PROPOFOL;  Surgeon: [name], MD;  Location[facility] ENDOSCOPY;  Service: Gastroenterology;  Laterality: N/A;  . COLONOSCOPY WITH PROPOFOL N/A 09/14/2018   Procedure: COLONOSCOPY WITH PROPOFOL;  Surgeon: [name], MD;  Location: [facility]  ENDOSCOPY;  Service: Gastroenterology;  Laterality: N/A;  . CORONARY ANGIOPLASTY  12/2007  . CORONARY ARTERY BYPASS GRAFT     2 vessel  . ESOPHAGOGASTRODUODENOSCOPY (EGD) WITH PROPOFOL N/A 06/29/2018   Procedure: ESOPHAGOGASTRODUODENOSCOPY (EGD) WITH PROPOFOL;  Surgeon: [name], MD;  Location: [facility] ENDOSCOPY;  Service: Gastroenterology;  Laterality: N/A;  . ESOPHAGOGASTRODUODENOSCOPY (EGD) WITH PROPOFOL N/A 09/14/2018   Procedure: ESOPHAGOGASTRODUODENOSCOPY (EGD) WITH PROPOFOL;  Surgeon: [name], MD;  Location: [facility] ENDOSCOPY;  Service: Gastroenterology;  Laterality: N/A;  . EYE SURGERY    . JOINT REPLACEMENT     total knee  . REPLACEMENT TOTAL KNEE    . TONSILLECTOMY    . VASECTOMY      Current Outpatient Medications  Medication Sig Dispense Refill  . alfuzosin (UROXATRAL) 10 MG 24 hr tablet Take 1 tablet (10 mg total) by mouth daily. 90 tablet 3  . Ascorbic Acid (VITAMIN C) 1000 MG tablet Take 1,000 mg by mouth daily.    Marland Kitchen aspirin EC 81 MG tablet Take 81 mg by mouth daily.    Marland Kitchen atorvastatin (LIPITOR) 40 MG tablet TAKE 1 TABLET (40 MG TOTAL) BY MOUTH DAILY AT 6 PM. TAKE 40 MG ALTERNATING WITH 20 MG DAILY. 45 tablet 0  . buPROPion (WELLBUTRIN SR) 150 MG 12 hr tablet TAKE 1 TABLET BY MOUTH TWICE A DAY 60 tablet 6  . calcium carbonate 1250 MG capsule Take 1,250 mg by mouth 2 (two) times daily with a meal.    . citalopram (CELEXA) 20  MG tablet Take 30 mg by mouth daily.    . Cranberry 1000 MG CAPS Take 2 capsules by mouth daily.    . Cyanocobalamin (B-12 PO) Take by mouth daily.    Marland Kitchen donepezil (ARICEPT) 5 MG tablet Take 5 mg by mouth 2 (two) times daily.     . finasteride (PROSCAR) 5 MG tablet Take 1 tablet (5 mg total) by mouth daily. 90 tablet 3  . magnesium oxide (MAG-OX) 400 MG tablet TAKE 1 TABLET (400 MG TOTAL) BY MOUTH 2 (TWO) TIMES DAILY AFTER A MEAL 60 tablet 3  . nitroGLYCERIN (NITROSTAT) 0.4 MG SL tablet Place 1 tablet (0.4 mg total) under the tongue every 5 (five) minutes  as needed. 90 tablet 3  . olopatadine (PATANOL) 0.1 % ophthalmic solution Place 1 drop into both eyes 2 (two) times daily. (Patient taking differently: Place 1 drop into both eyes as needed. ) 5 mL 12  . risperiDONE (RISPERDAL) 0.25 MG tablet Take 0.25 mg by mouth 2 (two) times daily.     Marland Kitchen zinc gluconate 50 MG tablet Take 50 mg by mouth daily.     No current facility-administered medications for this visit.     Allergies:   Biaxin [clarithromycin], Clarithromycin, Codeine, Penicillins, Promethazine, Iodinated diagnostic agents, and Metrizamide   Social History:  The patient  reports that he has quit smoking. He has never used smokeless tobacco. He reports that he does not drink alcohol or use drugs.   Family History:   family history includes Diabetes in his mother; Heart disease in his father; Hypertension in his mother; Thalassemia in his mother.    Review of Systems: Review of Systems  Constitutional: Negative.   HENT: Negative.   Eyes: Negative.   Respiratory: Negative.   Cardiovascular: Negative.  Negative for chest pain.  Gastrointestinal: Negative.   Genitourinary: Negative.   Musculoskeletal: Negative.   Neurological: Negative.   Psychiatric/Behavioral: Negative.   All other systems reviewed and are negative.   PHYSICAL EXAM: VS:  BP 110/70 (BP Location: Left Arm, Patient Position: Sitting, Cuff Size: Normal)   Pulse 74   Ht 5\' 3"  (1.6 m)   Wt 188 lb 2 oz (85.3 kg)   SpO2 98%   BMI 33.32 kg/m  , BMI Body mass index is 33.32 kg/m.  Constitutional:  oriented to person, place, and time. No distress.  HENT:  Head: Grossly normal Eyes:  no discharge. No scleral icterus.  Neck: No JVD, no carotid bruits  Cardiovascular: Regular rate and rhythm, no murmurs appreciated Pulmonary/Chest: Clear to auscultation bilaterally, no wheezes or rails Abdominal: Soft.  no distension.  no tenderness.  Musculoskeletal: Normal range of motion Neurological:  normal muscle tone.  Coordination normal. No atrophy Skin: Skin warm and dry Psychiatric: normal affect, pleasant  Recent Labs: No results found for requested labs within last 8760 hours.    Lipid Panel Lab Results  Component Value Date   CHOL 104 11/14/2016   HDL 35 (L) 11/14/2016   LDLCALC 42 11/14/2016   TRIG 75 06/25/2018      Wt Readings from Last 3 Encounters:  12/06/19 188 lb 2 oz (85.3 kg)  04/18/19 183 lb (83 kg)  11/09/18 187 lb 3.2 oz (84.9 kg)     ASSESSMENT AND PLAN:  Pure hypercholesterolemia Labs ordered, previously well controlled on current medications  Hypotension/orthostasis Denies any orthostasis symptoms, blood pressure stable, no medication changes made  Atherosclerosis of native coronary artery without angina pectoris, unspecified whether native or transplanted heart  Non-smoker, no significant diabetes, cholesterol at goal Denies any anginal symptoms, no further work-up at this time Following a exercise regiment  Class 1 obesity due to excess calories with serious comorbidity and body mass index (BMI) of 31.0 to 31.9 in adult Weight stable, recommended he continue his strict diet, walking program  Bradycardia Stable Beta-blocker previously held  GAD (generalized anxiety disorder) Has good supports, exercise program Stable  CKD (chronic kidney disease) stage 3, GFR 30-59 ml/min Lab work ordered  [employer] loss Assistance from ex-wife, friends Overall stable  Disposition:   F/U 12 months   Total encounter time more than 25 minutes  Greater than 50% was spent in counseling and coordination of care with the patient    No orders of the defined types were placed in this encounter.    Signed, Esmond Plants, M.D., Ph.D. 12/06/2019  [place], [place]

## 2019-12-06 ENCOUNTER — Other Ambulatory Visit: Payer: Self-pay

## 2019-12-06 ENCOUNTER — Ambulatory Visit: Payer: Medicare HMO | Admitting: Cardiovascular Disease

## 2019-12-06 ENCOUNTER — Encounter: Payer: Self-pay | Admitting: Cardiovascular Disease

## 2019-12-06 VITALS — BP 110/70 | HR 74 | Ht 63.0 in | Wt 188.1 lb

## 2019-12-06 DIAGNOSIS — R001 Bradycardia, unspecified: Secondary | ICD-10-CM | POA: Diagnosis not present

## 2019-12-06 DIAGNOSIS — E7800 Pure hypercholesterolemia, unspecified: Secondary | ICD-10-CM

## 2019-12-06 DIAGNOSIS — N183 Chronic kidney disease, stage 3 unspecified: Secondary | ICD-10-CM | POA: Diagnosis not present

## 2019-12-06 DIAGNOSIS — R55 Syncope and collapse: Secondary | ICD-10-CM | POA: Diagnosis not present

## 2019-12-06 DIAGNOSIS — D649 Anemia, unspecified: Secondary | ICD-10-CM

## 2019-12-06 DIAGNOSIS — I25118 Atherosclerotic heart disease of native coronary artery with other forms of angina pectoris: Secondary | ICD-10-CM

## 2019-12-06 NOTE — Patient Instructions (Addendum)
labs in lobby when fasting another day CBC: hx of anemia Lipids: hx of CAD, hyperlipidemia CMP: on statin     Medication Instructions:  No changes  If you need a refill on your cardiac medications before your next appointment, please call your pharmacy.    Lab work: lasb as above   If you have labs (blood work) drawn today and your tests are completely normal, you will receive your results only by: Marland Kitchen MyChart Message (if you have MyChart) OR . A paper copy in the mail If you have any lab test that is abnormal or we need to change your treatment, we will call you to review the results.   Testing/Procedures: No new testing needed   Follow-Up: At [facility], you and your health needs are our priority.  As part of our continuing mission to provide you with exceptional heart care, we have created designated Provider Care Teams.  These Care Teams include your primary Cardiologist (physician) and Advanced Practice Providers (APPs -  Physician Assistants and Nurse Practitioners) who all work together to provide you with the care you need, when you need it.  . You will need a follow up appointment in 12 months   . Providers on your designated Care Team:   . Murray Hodgkins, NP . Christell Faith, PA-C . Marrianne Mood, PA-C  Any Other Special Instructions Will Be Listed Below (If Applicable).  For educational health videos Log in to : [email or web address] Or : [email or web address], password : [email or web address]

## 2019-12-22 ENCOUNTER — Other Ambulatory Visit: Payer: Self-pay | Admitting: Cardiovascular Disease

## 2019-12-22 ENCOUNTER — Other Ambulatory Visit: Payer: Self-pay

## 2019-12-22 MED ORDER — ATORVASTATIN CALCIUM 40 MG PO TABS: ORAL_TABLET | ORAL | 0 refills | Status: DC

## 2019-12-25 IMAGING — CT CT ANGIO CHEST-ABD-PELV FOR DISSECTION W/ AND WO/W CM
2 of 7 series · 11 of 36 positions shown, 15 images · IV contrast (APPLIED)
Comparison: Chest x-ray today.

CLINICAL DATA: Chest pain, shortness of breath, nausea, dizziness
and diaphoresis. Now intubated

EXAM:
CT ANGIOGRAPHY CHEST, ABDOMEN AND PELVIS
TECHNIQUE: Multidetector CT imaging through the chest, abdomen and pelvis was
performed using the standard protocol during bolus administration of
intravenous contrast. Multiplanar reconstructed images and MIPs were
obtained and reviewed to evaluate the vascular anatomy.
CONTRAST:  100mL VQ0NH6-ADW IOPAMIDOL (VQ0NH6-ADW) INJECTION 76%

[Series 5: axial arterial · axial · arterial · 0.80mm/px · z∈[-621,-87]mm · 10 of 206 slices shown, 13 images]
[im 14/206  mediastinal]
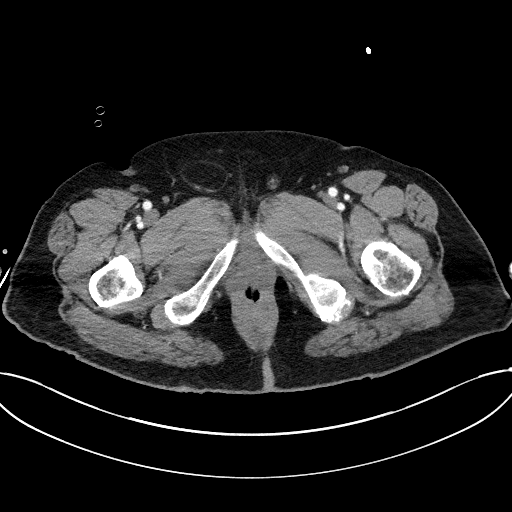
[im 14/206  bone]
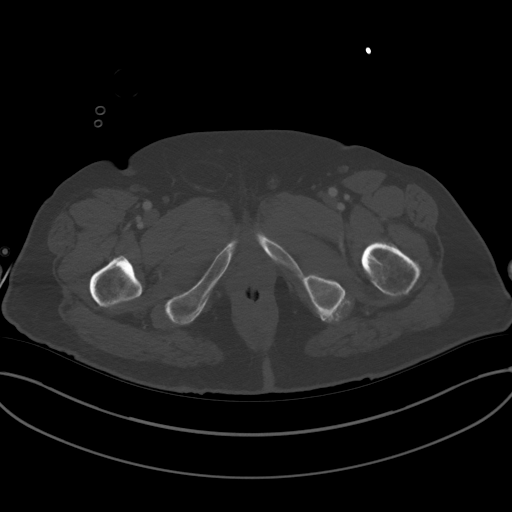
[im 42/206  mediastinal]
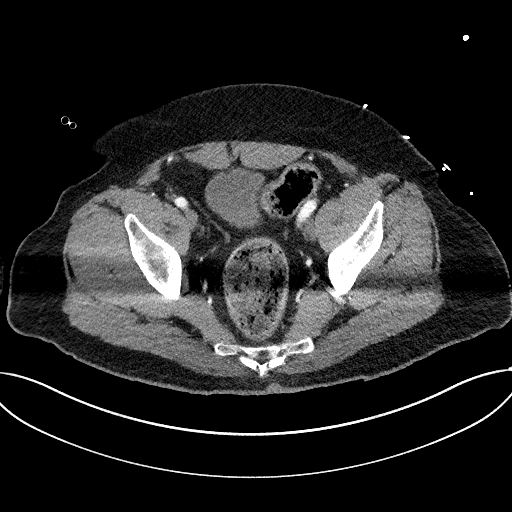
[im 69/206  mediastinal]
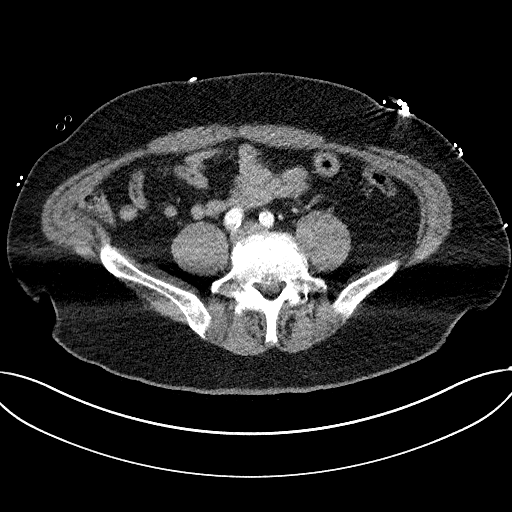
[im 96/206  mediastinal]
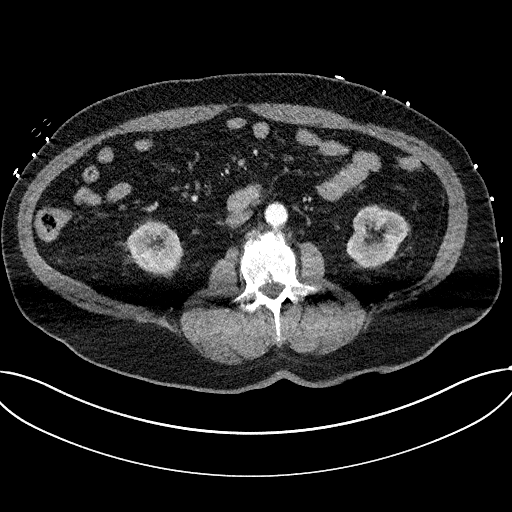
[im 110/206  mediastinal]
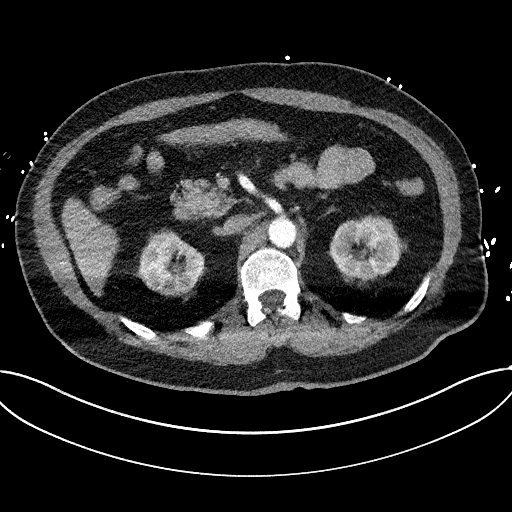
[im 137/206  mediastinal]
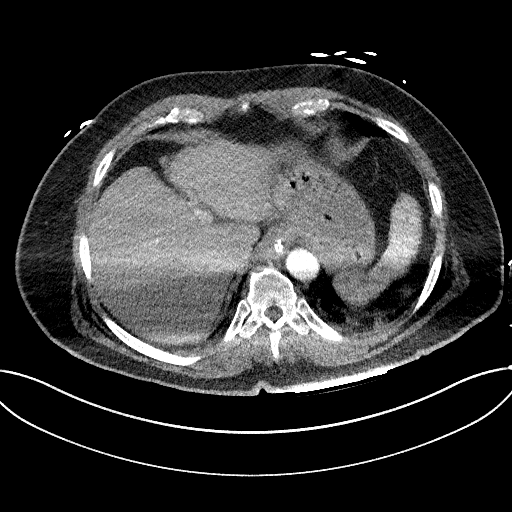
[im 151/206  lung]
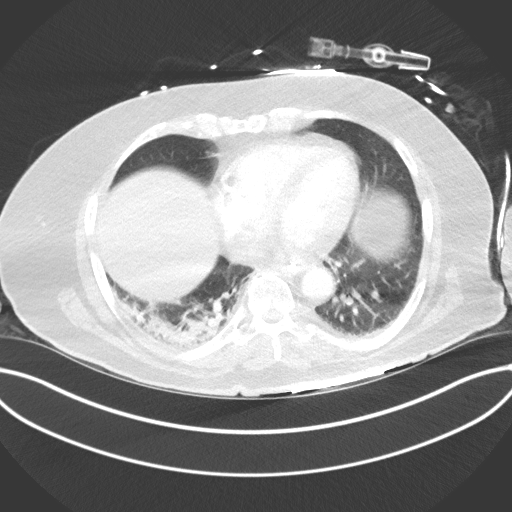
[im 165/206  mediastinal]
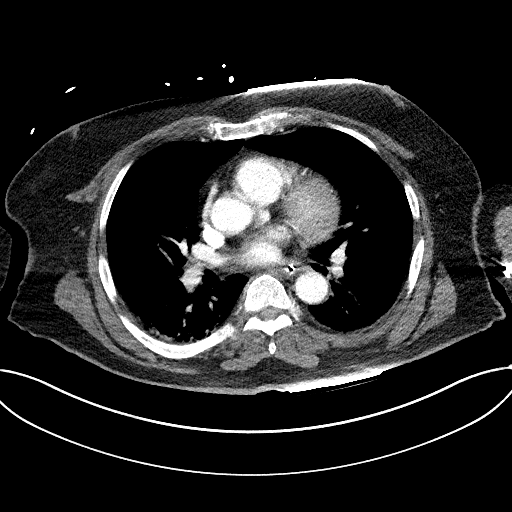
[im 165/206  lung]
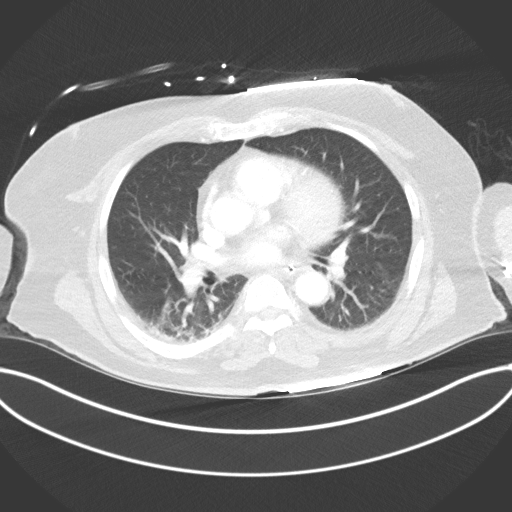
[im 178/206  lung]
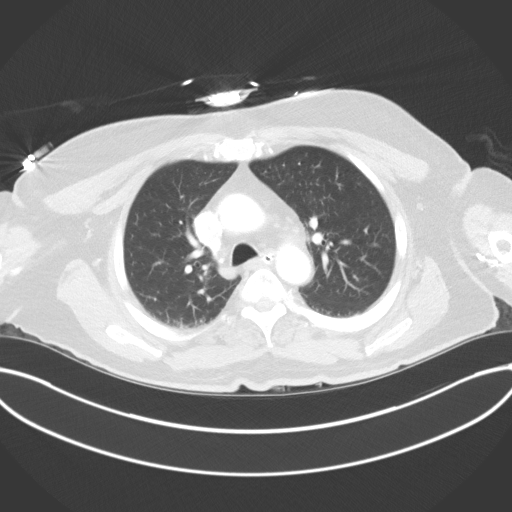
[im 192/206  mediastinal]
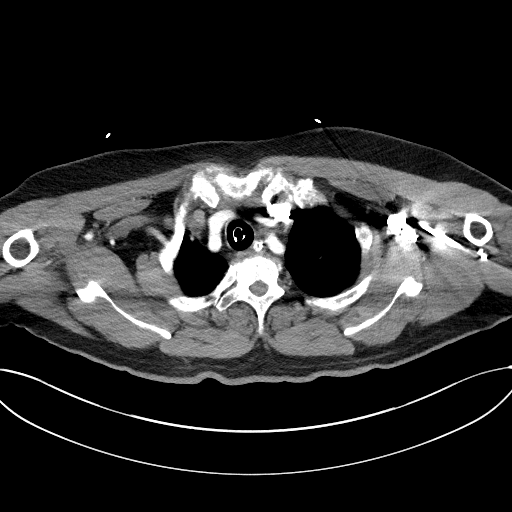
[im 192/206  lung]
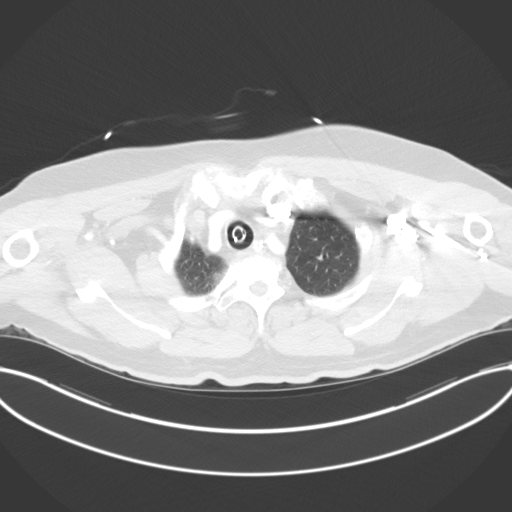

[Series 7: coronals · coronal · 0.80mm/px · 1 of 126 slices shown, 2 images]
[im 63/126  mediastinal]
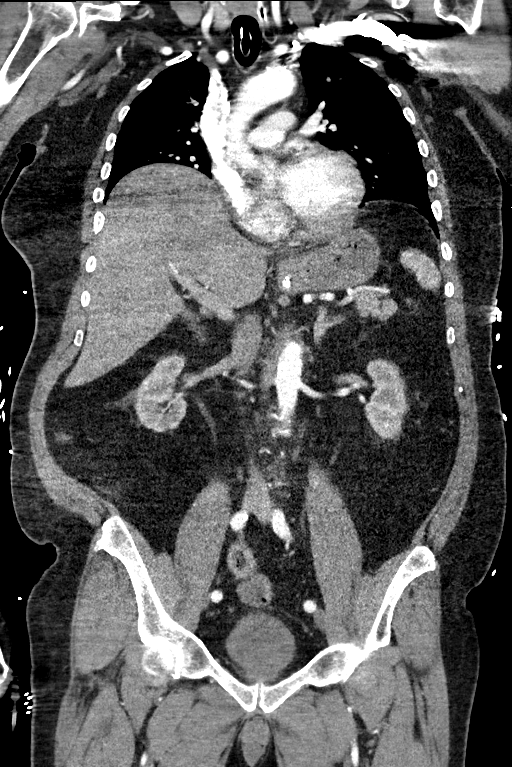
[im 63/126  bone]
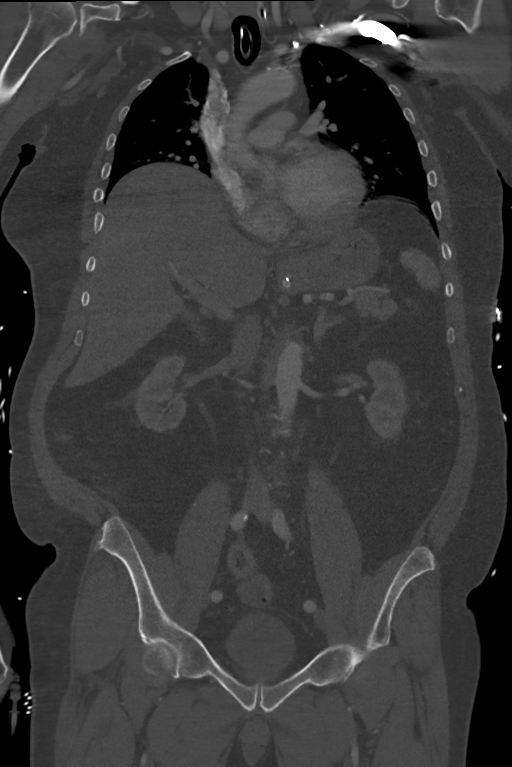

[11 of 36 positions shown; findings below may reference images not displayed]

FINDINGS: CTA CHEST FINDINGS

Cardiovascular: The thoracic aorta is normal in caliber and well
opacified. No evidence of aneurysmal disease or aortic dissection.
No significant atherosclerosis identified of the aorta or visualized
proximal great vessels. The heart size is normal. No pericardial
fluid identified. Central pulmonary arteries are of normal caliber.
Calcified plaque versus stent present in the distribution of the
LAD. There likely also is plaque versus stent in the distribution of
the RCA.

Mediastinum/Nodes: Endotracheal tube present. No enlarged
mediastinal, hilar, or axillary lymph nodes. Thyroid gland, trachea,
and esophagus demonstrate no significant findings.

Lungs/Pleura: Posterior bibasilar opacities present, right greater
than left most likely representing atelectasis. A component of
possible aspiration is not excluded. There is no evidence of
airspace edema, pneumothorax, nodule or pleural fluid.

Musculoskeletal: No chest wall abnormality. No acute or significant
osseous findings.

Review of the MIP images confirms the above findings.

CTA ABDOMEN AND PELVIS FINDINGS

VASCULAR

Aorta: The abdominal aorta is normally patent and demonstrates mild
atherosclerotic plaque. No evidence of aneurysmal disease or
dissection.

Celiac: Normally patent.  Normal distal branch vessel anatomy.

SMA: Normally patent.

Renals: Bilateral single renal arteries demonstrate normal patency.

IMA: Normally patent.

Inflow: Bilateral iliac arteries demonstrate normal patency. Common
femoral arteries and femoral bifurcations are normally patent.

Review of the MIP images confirms the above findings.

NON-VASCULAR

Hepatobiliary: No focal liver abnormality is seen. No gallstones,
gallbladder wall thickening, or biliary dilatation.

Pancreas: Unremarkable. No pancreatic ductal dilatation or
surrounding inflammatory changes.

Spleen: Normal in size without focal abnormality.

Adrenals/Urinary Tract: Adrenal glands are unremarkable. Kidneys are
normal, without renal calculi, focal lesion, or hydronephrosis.
Bladder is unremarkable.

Stomach/Bowel: Gastric decompression tube extends into the stomach.
Bowel shows no evidence of obstruction, ileus or perforation. No
inflammation identified. There is moderate stool in the rectum.

Lymphatic: No enlarged lymph nodes identified.

Reproductive: Prostate is unremarkable.

Other: A right inguinal hernia contains fat. No ascites or abscess
identified.

Musculoskeletal: The lumbar spine demonstrates degenerative disc
disease. No bony fractures or lesions identified.

Review of the MIP images confirms the above findings.
IMPRESSION: 1. No acute aortic pathology identified in the chest or abdomen. No
vascular abnormalities identified.
2. Bibasilar opacities, right greater than left may represent
atelectasis. Component of aspiration cannot be excluded, especially
at the right lung base.
3. Coronary atherosclerosis with evidence of calcified plaque and/or
stents in the LAD and RCA.
4. Right inguinal hernia containing fat.

## 2019-12-25 IMAGING — DX DG CHEST 1V PORT
1 series · 1 of 1 positions shown · non-contrast
Comparison: 06/25/2018

CLINICAL DATA: Central line placement

EXAM:
PORTABLE CHEST 1 VIEW

[chest ap]
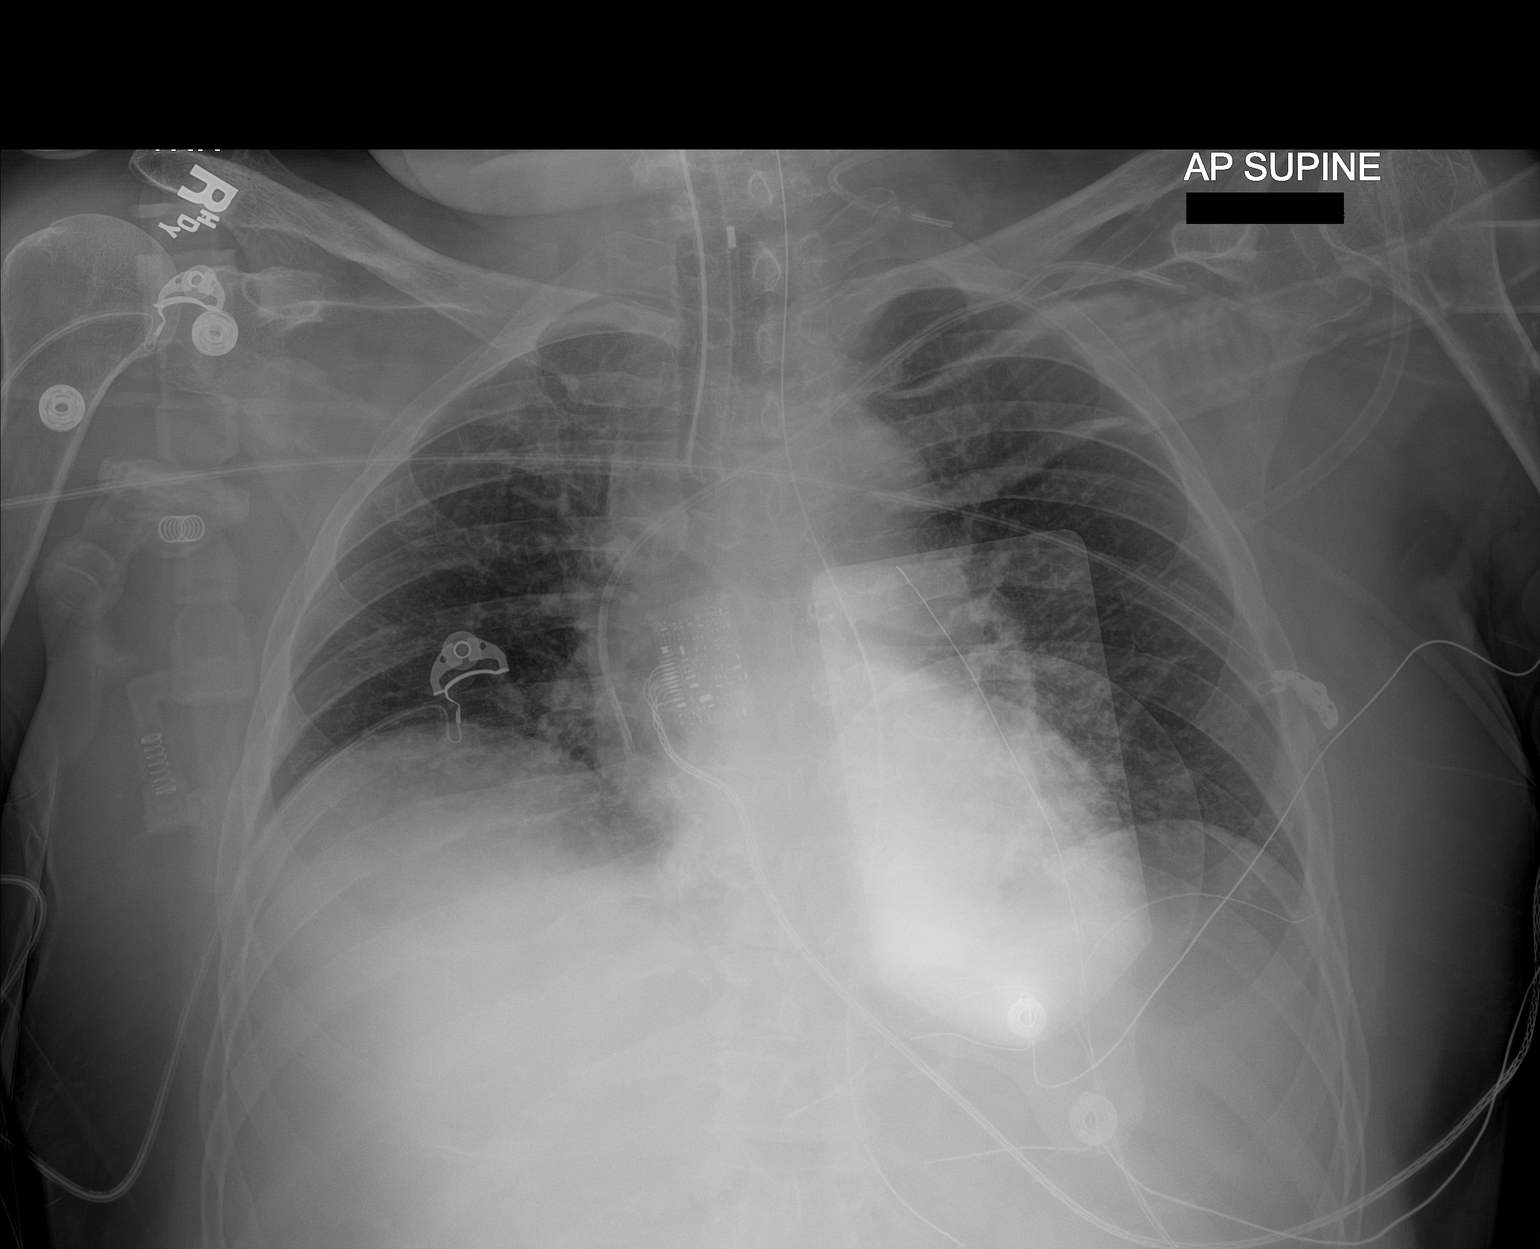

[1 of 1 positions shown; findings below may reference images not displayed]

FINDINGS: Left subclavian central line tip SVC RA junction level.

Endotracheal tube remains 3.4 cm above the carina. NG tube within
the stomach. Defibrillator pads overlie the chest.

Low lung volumes persist with scattered atelectasis. No focal
collapse or consolidation. No large effusion or pneumothorax.
IMPRESSION: Left subclavian central line tip SVC RA junction level.

Lower lung volumes with increased atelectasis

Other support apparatus stable.

## 2019-12-25 IMAGING — DX DG CHEST 1V PORT
1 series · 1 of 1 positions shown · non-contrast
Comparison: 06/25/2018.

CLINICAL DATA: Intubation.

EXAM:
PORTABLE CHEST 1 VIEW

[chest ap]
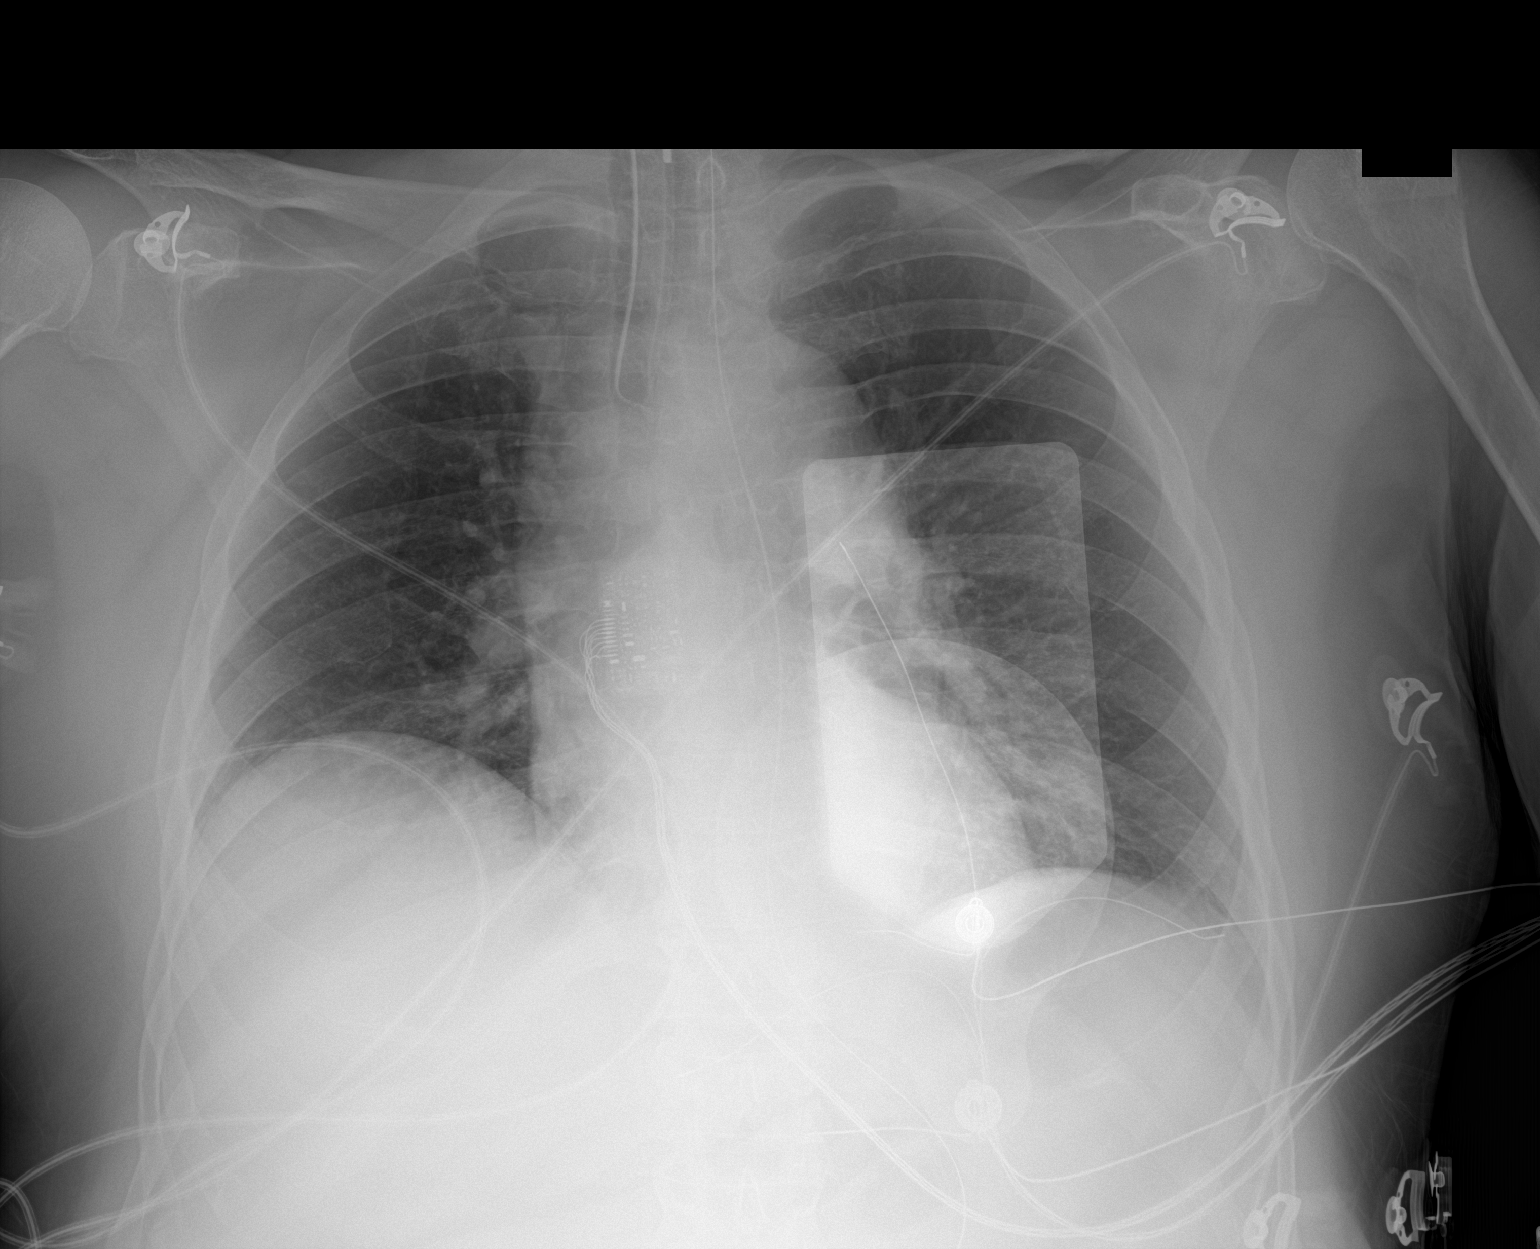

[1 of 1 positions shown; findings below may reference images not displayed]

FINDINGS: Endotracheal tube noted with tip 3 cm above the carina. NG tube
noted with tip below left hemidiaphragm. Heart size normal. Mild
bibasilar atelectasis/infiltrates. Tiny left pleural effusion. No
pneumothorax. No acute bony abnormality.
IMPRESSION: 1. Endotracheal tube noted with tip 3 cm above the carina. NG tube
noted with tip below left hemidiaphragm.

2. Mild bibasilar atelectasis/infiltrates. Tiny left pleural
effusion.

## 2020-02-16 ENCOUNTER — Other Ambulatory Visit: Payer: Self-pay | Admitting: Internal Medicine

## 2020-02-17 ENCOUNTER — Other Ambulatory Visit
Admission: RE | Admit: 2020-02-17 | Discharge: 2020-02-17 | Disposition: A | Discharge status: Home / Self Care | Payer: Medicare HMO | Source: Ambulatory Visit | Attending: Cardiovascular Disease | Admitting: Cardiovascular Disease

## 2020-02-17 DIAGNOSIS — I25118 Atherosclerotic heart disease of native coronary artery with other forms of angina pectoris: Secondary | ICD-10-CM | POA: Diagnosis present

## 2020-02-17 LAB — COMPREHENSIVE METABOLIC PANEL
ALT: 37 U/L (ref 0–44)
AST: 33 U/L (ref 15–41)
Albumin: 4 g/dL (ref 3.5–5.0)
Alkaline Phosphatase: 84 U/L (ref 38–126)
Anion gap: 9 (ref 5–15)
BUN: 16 mg/dL (ref 8–23)
CO2: 26 mmol/L (ref 22–32)
Calcium: 8.9 mg/dL (ref 8.9–10.3)
Chloride: 103 mmol/L (ref 98–111)
Creatinine, Ser: 1.31 mg/dL — ABNORMAL HIGH (ref 0.61–1.24)
GFR calc Af Amer: >60 mL/min (ref >60)
GFR calc non Af Amer: 55 mL/min — ABNORMAL LOW (ref >60)
Glucose, Bld: 104 mg/dL — ABNORMAL HIGH (ref 70–99)
Potassium: 3.8 mmol/L (ref 3.5–5.1)
Sodium: 138 mmol/L (ref 135–145)
Total Bilirubin: 1 mg/dL (ref 0.3–1.2)
Total Protein: 7 g/dL (ref 6.5–8.1)

## 2020-02-17 LAB — LIPID PANEL
Cholesterol: 114 mg/dL (ref 0–200)
HDL: 40 mg/dL — ABNORMAL LOW (ref >40)
LDL Cholesterol: 60 mg/dL (ref 0–99)
Total CHOL/HDL Ratio: 2.9 RATIO
Triglycerides: 72 mg/dL (ref <150)
VLDL: 14 mg/dL (ref 0–40)

## 2020-02-17 LAB — CBC
HCT: 38.9 % — ABNORMAL LOW (ref 39.0–52.0)
Hemoglobin: 12.2 g/dL — ABNORMAL LOW (ref 13.0–17.0)
MCH: 20.6 pg — ABNORMAL LOW (ref 26.0–34.0)
MCHC: 31.4 g/dL (ref 30.0–36.0)
MCV: 65.7 fL — ABNORMAL LOW (ref 80.0–100.0)
Platelets: 230 10*3/uL (ref 150–400)
RBC: 5.92 MIL/uL — ABNORMAL HIGH (ref 4.22–5.81)
RDW: 17.1 % — ABNORMAL HIGH (ref 11.5–15.5)
WBC: 7.4 10*3/uL (ref 4.0–10.5)
nRBC: 0 % (ref 0.0–0.2)

## 2020-02-20 ENCOUNTER — Telehealth: Payer: Self-pay

## 2020-02-20 NOTE — Telephone Encounter (Signed)
Call to patient to review labs.    left detailed message per DPR.   Advised pt to call for any further questions or concerns.  No further orders.

## 2020-02-20 NOTE — Telephone Encounter (Signed)
-----   Message from Minna Merritts, MD sent at 02/19/2020  6:34 PM EDT ----- Stable lab work Cholesterol good Other labs okay

## 2020-03-22 ENCOUNTER — Other Ambulatory Visit: Payer: Self-pay | Admitting: Cardiovascular Disease

## 2020-04-15 ENCOUNTER — Other Ambulatory Visit: Payer: Self-pay | Admitting: Urology

## 2020-04-19 ENCOUNTER — Ambulatory Visit: Payer: Medicare HMO | Admitting: Urology

## 2020-05-10 ENCOUNTER — Ambulatory Visit: Payer: Medicare HMO | Admitting: Urology

## 2020-05-21 ENCOUNTER — Ambulatory Visit: Payer: Medicare HMO | Admitting: Urology

## 2020-05-22 ENCOUNTER — Other Ambulatory Visit: Payer: Self-pay | Admitting: Urology

## 2020-06-07 ENCOUNTER — Other Ambulatory Visit: Payer: Self-pay

## 2020-06-07 ENCOUNTER — Ambulatory Visit (INDEPENDENT_AMBULATORY_CARE_PROVIDER_SITE_OTHER): Payer: Medicare HMO | Admitting: Urology

## 2020-06-07 ENCOUNTER — Encounter: Payer: Self-pay | Admitting: Urology

## 2020-06-07 VITALS — BP 126/78 | HR 83 | Ht 63.0 in | Wt 190.8 lb

## 2020-06-07 DIAGNOSIS — N401 Enlarged prostate with lower urinary tract symptoms: Secondary | ICD-10-CM | POA: Diagnosis not present

## 2020-06-07 DIAGNOSIS — B372 Candidiasis of skin and nail: Secondary | ICD-10-CM | POA: Diagnosis not present

## 2020-06-07 DIAGNOSIS — N138 Other obstructive and reflux uropathy: Secondary | ICD-10-CM

## 2020-06-07 LAB — BLADDER SCAN AMB NON-IMAGING

## 2020-06-07 MED ORDER — NYSTATIN 100000 UNIT/GM EX POWD: 1.0000 "application " | Freq: Two times a day (BID) | CUTANEOUS | 0 refills | Status: DC

## 2020-06-07 MED ORDER — FINASTERIDE 5 MG PO TABS: 5.0000 mg | ORAL_TABLET | Freq: Every day | ORAL | 3 refills | Status: DC

## 2020-06-07 MED ORDER — ALFUZOSIN HCL ER 10 MG PO TB24: 10.0000 mg | ORAL_TABLET | Freq: Every day | ORAL | 3 refills | Status: DC

## 2020-06-07 NOTE — Progress Notes (Signed)
   06/07/2020 1:42 PM   Edwin Woodward Jun 17, 1951 [id number]  Reason for visit: Follow up urinary symptoms  HPI: I saw Edwin Woodward back in in clinic today for BPH and urinary symptoms.  He is a 69 year old comorbid male with history of CAD, dementia with suspicion for Alzheimer's, and BPH.  He was previously followed by Dr. Jacqlyn Larsen at [facility].  He reports his BPH and urinary symptoms are very well controlled on alfuzosin and finasteride.  He underwent a cystoscopy 3 years ago with Dr. Jacqlyn Larsen that was notable for trilobar hyperplasia, median lobe, and high bladder neck.  PSAs have been within normal limit, and we agreed to discontinue screening previously secondary to his age and comorbidities.  Prostate measures 50 g on CT from October 2019.  He also reports a 20+ year history of urinary dribbling and incontinence that is minimally bothersome to him.  He reports he does not have any urge or stress incontinence, he will just leak without realizing it.   He denies any major changes over the last year.  Overall he is doing well.  IPSS score today is 4, with quality of life mostly satisfied.  PVR in clinic is normal today at 4 mL.  Continue alfuzosin and finasteride RTC 1 year  Billey Co, MD  [facility] [address], [place], [place] [address] [phone]

## 2020-06-07 NOTE — Patient Instructions (Signed)

## 2020-06-20 ENCOUNTER — Other Ambulatory Visit: Payer: Self-pay | Admitting: Cardiovascular Disease

## 2020-07-13 ENCOUNTER — Other Ambulatory Visit: Payer: Self-pay

## 2020-07-13 ENCOUNTER — Ambulatory Visit (INDEPENDENT_AMBULATORY_CARE_PROVIDER_SITE_OTHER): Payer: Medicare HMO

## 2020-07-13 DIAGNOSIS — Z23 Encounter for immunization: Secondary | ICD-10-CM

## 2020-09-03 ENCOUNTER — Telehealth: Payer: Self-pay

## 2020-09-03 MED ORDER — CLINDAMYCIN HCL 300 MG PO CAPS: ORAL_CAPSULE | ORAL | 1 refills | Status: DC

## 2020-09-03 NOTE — Telephone Encounter (Signed)
Clindamycin rx sent to [facility]

## 2020-09-03 NOTE — Telephone Encounter (Signed)
Pt's wife said pt needs clindamycin  300mg  sent [facility] for a dental procedure pt is having this week. She said this was not supposed to be removed from his medication list and he has to have it for his appointment. She also said the pharmacy should have sent this to [place] last week for her and her husband.

## 2020-10-11 ENCOUNTER — Other Ambulatory Visit: Payer: Self-pay | Admitting: Urology

## 2020-10-11 ENCOUNTER — Other Ambulatory Visit: Payer: Self-pay | Admitting: Cardiovascular Disease

## 2020-10-11 DIAGNOSIS — N401 Enlarged prostate with lower urinary tract symptoms: Secondary | ICD-10-CM

## 2020-10-11 DIAGNOSIS — N138 Other obstructive and reflux uropathy: Secondary | ICD-10-CM

## 2020-10-12 NOTE — Telephone Encounter (Signed)
Rx request sent to pharmacy.  

## 2020-12-04 ENCOUNTER — Other Ambulatory Visit: Payer: Self-pay | Admitting: Internal Medicine

## 2021-01-18 ENCOUNTER — Ambulatory Visit: Payer: Medicare HMO | Admitting: Cardiovascular Disease

## 2021-01-22 ENCOUNTER — Encounter: Payer: Self-pay | Admitting: Cardiovascular Disease

## 2021-01-22 ENCOUNTER — Other Ambulatory Visit: Payer: Self-pay

## 2021-01-22 ENCOUNTER — Ambulatory Visit (INDEPENDENT_AMBULATORY_CARE_PROVIDER_SITE_OTHER): Payer: Medicare HMO | Admitting: Cardiovascular Disease

## 2021-01-22 VITALS — BP 122/78 | HR 61 | Ht 63.0 in | Wt 194.1 lb

## 2021-01-22 DIAGNOSIS — E782 Mixed hyperlipidemia: Secondary | ICD-10-CM

## 2021-01-22 DIAGNOSIS — I25118 Atherosclerotic heart disease of native coronary artery with other forms of angina pectoris: Secondary | ICD-10-CM | POA: Diagnosis not present

## 2021-01-22 DIAGNOSIS — N183 Chronic kidney disease, stage 3 unspecified: Secondary | ICD-10-CM | POA: Diagnosis not present

## 2021-01-22 DIAGNOSIS — I10 Essential (primary) hypertension: Secondary | ICD-10-CM | POA: Diagnosis not present

## 2021-01-22 NOTE — Progress Notes (Signed)
Cardiology Office Note  Date:  01/22/2021   ID:  [name], [name] 04/27/1951, MRN [id number]  PCP:  [name], MD   Chief Complaint  Patient presents with  . 12 month follow up     "doing well." Medications reviewed by the patient verbally.     HPI:  Edwin Woodward is a very pleasant 70 y.o. gentleman who has a history of  coronary artery disease,  bare-metal stents placed to his proximal RCA and LAD in April 2009, Gout episodes x 2 (2015, 2016) total knee replacements bilaterally,  hypertension,  hyperlipidemia,  anxiety ADHD  Frontal lobe dementia who presents for routine followup of his coronary artery disease  In follow-up today reports he is doing well  accompanied by his ex-wife She does the shopping , she feels he is eating too much ice cream Weight up 15 pounds in the past 3 years Up 6 pounds in the past year Eating more ice cream, less walking Recently started on daily walks Continues to play drums daily Denies any chest pain on exertion Some shortness of breath when trying to tie his shoes  No leg edema, no PND orthopnea No tachypalpitations Lab work reviewed from middle of 2021  EKG personally reviewed by myself on todays visit Shows Sinus rhythm with rate 61 bpm right bundle branch block, left anterior fascicular block, no change  EKG has looked the same for the past 10 years.      PMH:   has a past medical history of CAD (coronary artery disease), Cardiovascular stress test abnormal, Hyperlipidemia, Hypertension, Presence of bare metal stent in right coronary artery, and Trigger finger.  PSH:    Past Surgical History:  Procedure Laterality Date  . APPENDECTOMY    . APPENDECTOMY    . CARPAL TUNNEL RELEASE    . CATARACT EXTRACTION     right  . COLONOSCOPY WITH PROPOFOL N/A 06/30/2018   Procedure: COLONOSCOPY WITH PROPOFOL;  Surgeon: [name], MD;  Location: [facility] ENDOSCOPY;  Service: Gastroenterology;  Laterality: N/A;  . COLONOSCOPY  WITH PROPOFOL N/A 09/14/2018   Procedure: COLONOSCOPY WITH PROPOFOL;  Surgeon: [name], MD;  Location: [facility] ENDOSCOPY;  Service: Gastroenterology;  Laterality: N/A;  . CORONARY ANGIOPLASTY  12/2007  . CORONARY ARTERY BYPASS GRAFT     2 vessel  . ESOPHAGOGASTRODUODENOSCOPY (EGD) WITH PROPOFOL N/A 06/29/2018   Procedure: ESOPHAGOGASTRODUODENOSCOPY (EGD) WITH PROPOFOL;  Surgeon: [name], MD;  Location: [facility] ENDOSCOPY;  Service: Gastroenterology;  Laterality: N/A;  . ESOPHAGOGASTRODUODENOSCOPY (EGD) WITH PROPOFOL N/A 09/14/2018   Procedure: ESOPHAGOGASTRODUODENOSCOPY (EGD) WITH PROPOFOL;  Surgeon: [name], MD;  Location: [facility] ENDOSCOPY;  Service: Gastroenterology;  Laterality: N/A;  . EYE SURGERY    . JOINT REPLACEMENT     total knee  . REPLACEMENT TOTAL KNEE    . TONSILLECTOMY    . VASECTOMY      Current Outpatient Medications  Medication Sig Dispense Refill  . alfuzosin (UROXATRAL) 10 MG 24 hr tablet TAKE 1 TABLET BY MOUTH EVERY DAY 90 tablet 3  . Ascorbic Acid (VITAMIN C) 1000 MG tablet Take 1,000 mg by mouth daily.    Marland Kitchen aspirin EC 81 MG tablet Take 81 mg by mouth daily.    Marland Kitchen atorvastatin (LIPITOR) 40 MG tablet TAKE TABLET DAILY AT 6 PM. TAKE 1 TABLET ALTERNATING WITH 1/2 TABLET DAILY 69 tablet 4  . buPROPion (WELLBUTRIN SR) 150 MG 12 hr tablet TAKE 1 TABLET BY MOUTH TWICE A DAY 60 tablet 6  . calcium  carbonate 1250 MG capsule Take 1,250 mg by mouth 2 (two) times daily with a meal.    . citalopram (CELEXA) 20 MG tablet Take 30 mg by mouth daily.    . clindamycin (CLEOCIN) 300 MG capsule 2 CAPSULES ONE HOUR BEFORE DENTAL PROCEDURE 2 capsule 1  . Cyanocobalamin (B-12 PO) Take by mouth daily.    Marland Kitchen donepezil (ARICEPT) 5 MG tablet Take 5 mg by mouth 2 (two) times daily.     . finasteride (PROSCAR) 5 MG tablet TAKE 1 TABLET BY MOUTH EVERY DAY 90 tablet 3  . magnesium oxide (MAG-OX) 400 MG tablet TAKE 1 TABLET (400 MG TOTAL) BY MOUTH 2 (TWO) TIMES DAILY AFTER A MEAL 60 tablet 3  .  nitroGLYCERIN (NITROSTAT) 0.4 MG SL tablet Place 1 tablet (0.4 mg total) under the tongue every 5 (five) minutes as needed. 90 tablet 3  . nystatin (MYCOSTATIN/NYSTOP) powder Apply 1 application topically 2 (two) times daily. 60 g 0  . olopatadine (PATANOL) 0.1 % ophthalmic solution Place 1 drop into both eyes 2 (two) times daily. (Patient taking differently: Place 1 drop into both eyes as needed.) 5 mL 12  . zinc gluconate 50 MG tablet Take 50 mg by mouth daily.    . risperiDONE (RISPERDAL) 0.25 MG tablet Take 0.25 mg by mouth 2 (two) times daily.      No current facility-administered medications for this visit.     Allergies:   Biaxin [clarithromycin], Clarithromycin, Codeine, Penicillins, Promethazine, Iodinated diagnostic agents, and Metrizamide   Social History:  The patient  reports that he has quit smoking. He has never used smokeless tobacco. He reports that he does not drink alcohol and does not use drugs.   Family History:   family history includes Diabetes in his mother; Heart disease in his father; Hypertension in his mother; Thalassemia in his mother.    Review of Systems: Review of Systems  Constitutional: Negative.   HENT: Negative.   Eyes: Negative.   Respiratory: Negative.   Cardiovascular: Negative.  Negative for chest pain.  Gastrointestinal: Negative.   Genitourinary: Negative.   Musculoskeletal: Negative.   Neurological: Negative.   Psychiatric/Behavioral: Negative.   All other systems reviewed and are negative.   PHYSICAL EXAM: VS:  BP 122/78 (BP Location: Left Arm, Patient Position: Sitting, Cuff Size: Normal)   Pulse 61   Ht 5\' 3"  (1.6 m)   Wt 194 lb 2 oz (88.1 kg)   SpO2 98%   BMI 34.39 kg/m  , BMI Body mass index is 34.39 kg/m.  Constitutional:  oriented to person, place, and time. No distress.  HENT:  Head: Grossly normal Eyes:  no discharge. No scleral icterus.  Neck: No JVD, no carotid bruits  Cardiovascular: Regular rate and rhythm, no  murmurs appreciated Pulmonary/Chest: Clear to auscultation bilaterally, no wheezes or rails Abdominal: Soft.  no distension.  no tenderness.  Musculoskeletal: Normal range of motion Neurological:  normal muscle tone. Coordination normal. No atrophy Skin: Skin warm and dry Psychiatric: normal affect, pleasant   Recent Labs: 02/17/2020: ALT 37; BUN 16; Creatinine, Ser 1.31; Hemoglobin 12.2; Platelets 230; Potassium 3.8; Sodium 138    Lipid Panel Lab Results  Component Value Date   CHOL 114 02/17/2020   HDL 40 (L) 02/17/2020   LDLCALC 60 02/17/2020   TRIG 72 02/17/2020      Wt Readings from Last 3 Encounters:  01/22/21 194 lb 2 oz (88.1 kg)  06/07/20 190 lb 12.8 oz (86.5 kg)  12/06/19 188 lb  2 oz (85.3 kg)     ASSESSMENT AND PLAN:  Pure hypercholesterolemia Cholesterol at goal, no changes made, recommend walking program, dietary changes  Hypotension/orthostasis Denies orthostasis, blood pressure stable  Atherosclerosis of native coronary artery without angina pectoris, unspecified whether native or transplanted heart Non-smoker, no significant diabetes, cholesterol at goal No anginal symptoms, no further testing Recommend daily walking, weight loss  Class 1 obesity due to excess calories with serious comorbidity and body mass index (BMI) of 31.0 to 31.9 in adult We have encouraged continued exercise, careful diet management in an effort to lose weight.  up 15 pounds in 3 years  Bradycardia Beta-blocker previously held, not an active issue  GAD (generalized anxiety disorder) Doing well, has good supports  CKD (chronic kidney disease) stage 3, GFR 30-59 ml/min Lab work ordered  Memory loss Assistance from ex-wife, friends Stable    Total encounter time more than 25 minutes  Greater than 50% was spent in counseling and coordination of care with the patient    No orders of the defined types were placed in this encounter.    Signed, Edwin Woodward, M.D.,  Ph.D. 01/22/2021  [place], [place]

## 2021-01-22 NOTE — Patient Instructions (Signed)
Medication Instructions:  No changes  If you need a refill on your cardiac medications before your next appointment, please call your pharmacy.    Lab work: No new labs needed   If you have labs (blood work) drawn today and your tests are completely normal, you will receive your results only by: . MyChart Message (if you have MyChart) OR . A paper copy in the mail If you have any lab test that is abnormal or we need to change your treatment, we will call you to review the results.   Testing/Procedures: No new testing needed   Follow-Up: At [facility], you and your health needs are our priority.  As part of our continuing mission to provide you with exceptional heart care, we have created designated Provider Care Teams.  These Care Teams include your primary Cardiologist (physician) and Advanced Practice Providers (APPs -  Physician Assistants and Nurse Practitioners) who all work together to provide you with the care you need, when you need it.  . You will need a follow up appointment in 12 months  . Providers on your designated Care Team:   . Christopher Berge, NP . Ryan Dunn, PA-C . Jacquelyn Visser, PA-C  Any Other Special Instructions Will Be Listed Below (If Applicable).  COVID-19 Vaccine Information can be found at: [email or web address] For questions related to vaccine distribution or appointments, please email [email or web address] or call [phone].     

## 2021-06-12 ENCOUNTER — Ambulatory Visit: Payer: Self-pay | Admitting: Urology

## 2021-07-11 ENCOUNTER — Ambulatory Visit: Payer: Self-pay | Admitting: Urology

## 2021-08-08 ENCOUNTER — Ambulatory Visit: Payer: Self-pay | Admitting: Urology

## 2021-08-15 ENCOUNTER — Other Ambulatory Visit: Payer: Self-pay

## 2021-08-15 ENCOUNTER — Ambulatory Visit (INDEPENDENT_AMBULATORY_CARE_PROVIDER_SITE_OTHER): Payer: Medicare HMO | Admitting: Urology

## 2021-08-15 ENCOUNTER — Encounter: Payer: Self-pay | Admitting: Urology

## 2021-08-15 VITALS — BP 138/87 | HR 80 | Ht 63.0 in | Wt 196.0 lb

## 2021-08-15 DIAGNOSIS — N401 Enlarged prostate with lower urinary tract symptoms: Secondary | ICD-10-CM

## 2021-08-15 DIAGNOSIS — N3281 Overactive bladder: Secondary | ICD-10-CM

## 2021-08-15 DIAGNOSIS — N138 Other obstructive and reflux uropathy: Secondary | ICD-10-CM | POA: Diagnosis not present

## 2021-08-15 DIAGNOSIS — B372 Candidiasis of skin and nail: Secondary | ICD-10-CM

## 2021-08-15 LAB — BLADDER SCAN AMB NON-IMAGING

## 2021-08-15 MED ORDER — NYSTATIN 100000 UNIT/GM EX POWD: 1.0000 "application " | Freq: Two times a day (BID) | CUTANEOUS | 0 refills | Status: AC

## 2021-08-15 MED ORDER — ALFUZOSIN HCL ER 10 MG PO TB24: 10.0000 mg | ORAL_TABLET | Freq: Every day | ORAL | 3 refills | Status: DC

## 2021-08-15 MED ORDER — FINASTERIDE 5 MG PO TABS: 5.0000 mg | ORAL_TABLET | Freq: Every day | ORAL | 3 refills | Status: DC

## 2021-08-15 NOTE — Patient Instructions (Signed)

## 2021-08-15 NOTE — Progress Notes (Signed)
   08/15/2021 1:51 PM   Edwin Woodward Apr 24, 1951 [id number]  Reason for visit: Follow up BPH, incontinence, OAB  HPI: I saw [name] back in in clinic today for BPH and urinary symptoms.  He is a 70 year old comorbid male with history of CAD, dementia with suspicion for Alzheimer's, and BPH.  He was previously followed by Dr. Jacqlyn Larsen at [facility].  He reports his BPH and urinary symptoms are very well controlled on alfuzosin and finasteride.  He underwent a cystoscopy 4 years ago with Dr. Jacqlyn Larsen that was notable for trilobar hyperplasia, median lobe, and high bladder neck.  PSAs have been within normal limit, and we agreed to discontinue screening previously secondary to his age and comorbidities.  Prostate measures 50 g on CT from October 2019.  He also reports a 20+ year history of urinary dribbling and incontinence that is minimally bothersome to him.  His son is with him today and helps provide some of the history.  It sounds like he has some incontinence without realization, but also some urgency and frequency during the day that, though not bothersome to the patient, impacts his quality of life and ability to be out of the house.    PVR in clinic is normal today at 53ml.  I think it is reasonable to try a overactive bladder medication like vibegron to see if we can help with some of his urgency, frequency, and incontinence.  He would not be a candidate for anticholinergics with his frailty and dementia.   Continue alfuzosin and finasteride Trial of vibegron 75 mg daily Phone visit 4 weeks symptom check    Billey Co, MD  [facility] [address], [place], [place] [address] [phone]

## 2021-09-19 ENCOUNTER — Telehealth: Payer: Medicare HMO | Admitting: Urology

## 2021-10-02 ENCOUNTER — Telehealth: Payer: Medicare HMO | Admitting: Urology

## 2021-10-18 ENCOUNTER — Other Ambulatory Visit: Payer: Self-pay | Admitting: Cardiovascular Disease

## 2021-11-21 ENCOUNTER — Other Ambulatory Visit: Payer: Self-pay | Admitting: Cardiovascular Disease

## 2021-12-28 ENCOUNTER — Other Ambulatory Visit: Payer: Self-pay | Admitting: Internal Medicine

## 2022-03-31 NOTE — Progress Notes (Unsigned)
Cardiology Office Note  Date:  04/01/2022   ID:  [name], [name] 1950/09/14, MRN [id number]  PCP:  [name], MD   Chief Complaint  Patient presents with   12 month follow up     "Doing well." Medications reviewed by the patient verbally.     HPI:  [name] is a very pleasant 71 y.o. gentleman who has a history of  coronary artery disease,  bare-metal stents placed to his proximal RCA and LAD in April 2009, Gout episodes x 2 (2015, 2016) total knee replacements bilaterally,  hypertension,  hyperlipidemia,  anxiety ADHD  Frontal lobe dementia who presents for routine followup of his coronary artery disease  Last seen by myself in clinic May 2022  accompanied by his ex-wife, She does the shopping  In follow-up today reports no significant changes Denies shortness of breath or chest pain on exertion No regular exercise program PVCs noted on EKG, denies symptoms of  palpitations, No leg edema, no PND orthopnea No tachypalpitations  Weight stable, down 2 pounds Cut back on ice cream apart from the summer  Reports poor sleep, restless Naps in day  No recent labs  EKG personally reviewed by myself on todays visit Shows Sinus rhythm with rate 70 bpm right bundle branch block, left anterior fascicular block, no change, PVCs noted  EKG has looked the same for the past 10 years.      PMH:   has a past medical history of CAD (coronary artery disease), Cardiovascular stress test abnormal, Hyperlipidemia, Hypertension, Presence of bare metal stent in right coronary artery, and Trigger finger.  PSH:    Past Surgical History:  Procedure Laterality Date   APPENDECTOMY     APPENDECTOMY     CARPAL TUNNEL RELEASE     CATARACT EXTRACTION     right   COLONOSCOPY WITH PROPOFOL N/A 06/30/2018   Procedure: COLONOSCOPY WITH PROPOFOL;  Surgeon: [name], MD;  Location: [facility] ENDOSCOPY;  Service: Gastroenterology;  Laterality: N/A;   COLONOSCOPY WITH PROPOFOL N/A  09/14/2018   Procedure: COLONOSCOPY WITH PROPOFOL;  Surgeon: [name], MD;  Location: [facility] ENDOSCOPY;  Service: Gastroenterology;  Laterality: N/A;   CORONARY ANGIOPLASTY  12/2007   CORONARY ARTERY BYPASS GRAFT     2 vessel   ESOPHAGOGASTRODUODENOSCOPY (EGD) WITH PROPOFOL N/A 06/29/2018   Procedure: ESOPHAGOGASTRODUODENOSCOPY (EGD) WITH PROPOFOL;  Surgeon: [name], MD;  Location: [facility] ENDOSCOPY;  Service: Gastroenterology;  Laterality: N/A;   ESOPHAGOGASTRODUODENOSCOPY (EGD) WITH PROPOFOL N/A 09/14/2018   Procedure: ESOPHAGOGASTRODUODENOSCOPY (EGD) WITH PROPOFOL;  Surgeon: [name], MD;  Location: [facility] ENDOSCOPY;  Service: Gastroenterology;  Laterality: N/A;   EYE SURGERY     JOINT REPLACEMENT     total knee   REPLACEMENT TOTAL KNEE     TONSILLECTOMY     VASECTOMY      Current Outpatient Medications  Medication Sig Dispense Refill   alfuzosin (UROXATRAL) 10 MG 24 hr tablet Take 1 tablet (10 mg total) by mouth daily. 90 tablet 3   Ascorbic Acid (VITAMIN C) 1000 MG tablet Take 1,000 mg by mouth daily.     aspirin EC 81 MG tablet Take 81 mg by mouth daily.     atorvastatin (LIPITOR) 40 MG tablet TAKE TABLET DAILY AT 6 PM. TAKE 1 TABLET ALTERNATING WITH 1/2 TABLET DAILY 67.5 tablet 2   buPROPion (WELLBUTRIN SR) 150 MG 12 hr tablet TAKE 1 TABLET BY MOUTH TWICE A DAY 60 tablet 6   calcium carbonate 1250 MG capsule Take  1,250 mg by mouth 2 (two) times daily with a meal.     citalopram (CELEXA) 20 MG tablet Take 30 mg by mouth daily.     clindamycin (CLEOCIN) 300 MG capsule TAKE 2 CAPSULES BY MOUTH 1 HR BEFORE DENTAL PROCEDURE 2 capsule 1   Cyanocobalamin (B-12 PO) Take by mouth daily.     donepezil (ARICEPT) 5 MG tablet Take 5 mg by mouth 2 (two) times daily.      finasteride (PROSCAR) 5 MG tablet Take 1 tablet (5 mg total) by mouth daily. 90 tablet 3   magnesium oxide (MAG-OX) 400 MG tablet TAKE 1 TABLET (400 MG TOTAL) BY MOUTH 2 (TWO) TIMES DAILY AFTER A MEAL 60 tablet 3   memantine  (NAMENDA) 5 MG tablet Take 5 mg by mouth 2 (two) times daily.     nitroGLYCERIN (NITROSTAT) 0.4 MG SL tablet Place 1 tablet (0.4 mg total) under the tongue every 5 (five) minutes as needed. 90 tablet 3   nystatin (MYCOSTATIN/NYSTOP) powder Apply 1 application topically 2 (two) times daily. 60 g 0   olopatadine (PATANOL) 0.1 % ophthalmic solution Place 1 drop into both eyes 2 (two) times daily. (Patient taking differently: Place 1 drop into both eyes as needed.) 5 mL 12   risperiDONE (RISPERDAL) 0.25 MG tablet Take 0.25 mg by mouth 2 (two) times daily.      zinc gluconate 50 MG tablet Take 50 mg by mouth daily.     No current facility-administered medications for this visit.     Allergies:   Penicillins, Clarithromycin, Clarithromycin, Promethazine, Codeine, Iodinated contrast media, and Metrizamide   Social History:  The patient  reports that he has quit smoking. He has never used smokeless tobacco. He reports that he does not drink alcohol and does not use drugs.   Family History:   family history includes Diabetes in his mother; Heart disease in his father; Hypertension in his mother; Thalassemia in his mother.    Review of Systems: Review of Systems  Constitutional: Negative.   HENT: Negative.    Eyes: Negative.   Respiratory: Negative.    Cardiovascular: Negative.  Negative for chest pain.  Gastrointestinal: Negative.   Genitourinary: Negative.   Musculoskeletal: Negative.   Neurological: Negative.   Psychiatric/Behavioral: Negative.    All other systems reviewed and are negative.   PHYSICAL EXAM: VS:  BP 110/60 (BP Location: Left Arm, Patient Position: Sitting, Cuff Size: Normal)   Pulse 70   Ht '5\' 4"'$  (1.626 m)   Wt 192 lb 6 oz (87.3 kg)   SpO2 97%   BMI 33.02 kg/m  , BMI Body mass index is 33.02 kg/m. Constitutional:  oriented to person, place, and time. No distress.  HENT:  Head: Grossly normal Eyes:  no discharge. No scleral icterus.  Neck: No JVD, no carotid  bruits  Cardiovascular: Regular rate and rhythm, no murmurs appreciated Pulmonary/Chest: Clear to auscultation bilaterally, no wheezes or rails Abdominal: Soft.  no distension.  no tenderness.  Musculoskeletal: Normal range of motion Neurological:  normal muscle tone. Coordination normal. No atrophy Skin: Skin warm and dry Psychiatric: normal affect, pleasant  Recent Labs: No results found for requested labs within last 365 days.    Lipid Panel Lab Results  Component Value Date   CHOL 114 02/17/2020   HDL 40 (L) 02/17/2020   LDLCALC 60 02/17/2020   TRIG 72 02/17/2020      Wt Readings from Last 3 Encounters:  04/01/22 192 lb 6 oz (87.3 kg)  08/15/21 196 lb (88.9 kg)  01/22/21 194 lb 2 oz (88.1 kg)     ASSESSMENT AND PLAN:  Pure hypercholesterolemia Repeat lipid panel ordered Prior lipid panel well-controlled, still on a statin  Hypotension/orthostasis Low blood pressure, denies any symptoms of orthostasis  Atherosclerosis of native coronary artery without angina pectoris, unspecified whether native or transplanted heart Non-smoker, no significant diabetes, cholesterol at goal Currently with no symptoms of angina. No further workup at this time. Continue current medication regimen. Exercise recommended  Class 1 obesity due to excess calories with serious comorbidity and body mass index (BMI) of 31.0 to 31.9 in adult Calorie restriction and walking program suggested  Bradycardia/PVCs Beta-blocker previously held, not an active issue PVCs noted but asymptomatic  GAD (generalized anxiety disorder) Doing well, has good supports from ex-wife  CKD (chronic kidney disease) stage 3, GFR 30-59 ml/min Lab work ordered  Memory loss Assistance from ex-wife, friends Stable    Total encounter time more than 30 minutes  Greater than 50% was spent in counseling and coordination of care with the patient    Orders Placed This Encounter  Procedures   EKG 12-Lead      Signed, Esmond Plants, M.D., Ph.D. 04/01/2022  [place], [place]

## 2022-04-01 ENCOUNTER — Encounter: Payer: Self-pay | Admitting: Cardiovascular Disease

## 2022-04-01 ENCOUNTER — Ambulatory Visit: Payer: Medicare HMO | Admitting: Cardiovascular Disease

## 2022-04-01 VITALS — BP 110/60 | HR 70 | Ht 64.0 in | Wt 192.4 lb

## 2022-04-01 DIAGNOSIS — I25118 Atherosclerotic heart disease of native coronary artery with other forms of angina pectoris: Secondary | ICD-10-CM | POA: Diagnosis not present

## 2022-04-01 DIAGNOSIS — I10 Essential (primary) hypertension: Secondary | ICD-10-CM | POA: Diagnosis not present

## 2022-04-01 DIAGNOSIS — E782 Mixed hyperlipidemia: Secondary | ICD-10-CM | POA: Diagnosis not present

## 2022-04-01 DIAGNOSIS — N183 Chronic kidney disease, stage 3 unspecified: Secondary | ICD-10-CM | POA: Diagnosis not present

## 2022-04-01 NOTE — Patient Instructions (Addendum)
Medication Instructions:  No changes  If you need a refill on your cardiac medications before your next appointment, please call your pharmacy.   Lab work: Your physician recommends that you return for a FASTING lipid profile, CBC, CMP later this week  - You will need to be fasting. Please do not have anything to eat or drink after midnight the morning you have the lab work. You may only have water or black coffee with no cream or sugar.   - Please go to the [facility]. You will check in at the front desk to the right as you walk into the atrium. Valet Parking is offered if needed. - No appointment needed. You may go any day between 7 am and 6 pm.    Testing/Procedures: No new testing needed  Follow-Up: At [facility], you and your health needs are our priority.  As part of our continuing mission to provide you with exceptional heart care, we have created designated Provider Care Teams.  These Care Teams include your primary Cardiologist (physician) and Advanced Practice Providers (APPs -  Physician Assistants and Nurse Practitioners) who all work together to provide you with the care you need, when you need it.  You will need a follow up appointment in 12 months  Providers on your designated Care Team:   Murray Hodgkins, NP Christell Faith, PA-C Cadence Kathlen Mody, [place]  COVID-19 Vaccine Information can be found at: [email or web address] For questions related to vaccine distribution or appointments, please email [email or web address] or call [phone].

## 2022-05-09 ENCOUNTER — Other Ambulatory Visit: Payer: Self-pay | Admitting: Cardiovascular Disease

## 2022-09-12 ENCOUNTER — Other Ambulatory Visit: Payer: Self-pay | Admitting: Urology

## 2022-09-12 DIAGNOSIS — N138 Other obstructive and reflux uropathy: Secondary | ICD-10-CM

## 2022-09-12 NOTE — Telephone Encounter (Signed)
Pt have canceled all appts or noshowed, refill denied.

## 2022-10-22 ENCOUNTER — Telehealth: Payer: Self-pay | Admitting: Urology

## 2022-10-22 ENCOUNTER — Telehealth: Payer: Self-pay | Admitting: Internal Medicine

## 2022-10-22 NOTE — Telephone Encounter (Signed)
Pt wife Harmon Pier called in asking if Dr.Tullo can see him. I didn't booked an appt with him because its been 3 yrs since see saw Tullo. As per wife, they denying his care at the heart center, and she would like to establish care back here. Any questions, she's available @336$ [phone].

## 2022-10-22 NOTE — Telephone Encounter (Signed)
Pt wife called stating that she was able to book an appointment at a new neurology facility in [place] on march 8, but pt need to see Tullo ASAP just to get a months worth of medication

## 2022-10-22 NOTE — Telephone Encounter (Signed)
Harmon Pier called and was not happy that we didn't [facility], or make an appt.  I explained to her that the pt had too many No Shows and Cancels, per Dr. Diamantina Providence.  Harmon Pier would like a call back [phone].  Thank you.

## 2022-10-23 NOTE — Telephone Encounter (Signed)
Pt scheduled 2/21

## 2022-10-29 ENCOUNTER — Ambulatory Visit: Payer: Medicare HMO | Admitting: Internal Medicine

## 2022-10-29 ENCOUNTER — Encounter: Payer: Self-pay | Admitting: Internal Medicine

## 2022-10-29 VITALS — BP 124/70 | HR 63 | Temp 98.4°F | Ht 64.0 in | Wt 197.6 lb

## 2022-10-29 DIAGNOSIS — I10 Essential (primary) hypertension: Secondary | ICD-10-CM

## 2022-10-29 DIAGNOSIS — Z6831 Body mass index (BMI) 31.0-31.9, adult: Secondary | ICD-10-CM

## 2022-10-29 DIAGNOSIS — Z125 Encounter for screening for malignant neoplasm of prostate: Secondary | ICD-10-CM | POA: Diagnosis not present

## 2022-10-29 DIAGNOSIS — E6609 Other obesity due to excess calories: Secondary | ICD-10-CM

## 2022-10-29 DIAGNOSIS — Z23 Encounter for immunization: Secondary | ICD-10-CM

## 2022-10-29 DIAGNOSIS — F0280 Dementia in other diseases classified elsewhere without behavioral disturbance: Secondary | ICD-10-CM

## 2022-10-29 DIAGNOSIS — R7301 Impaired fasting glucose: Secondary | ICD-10-CM | POA: Diagnosis not present

## 2022-10-29 DIAGNOSIS — G1220 Motor neuron disease, unspecified: Secondary | ICD-10-CM

## 2022-10-29 DIAGNOSIS — R5383 Other fatigue: Secondary | ICD-10-CM | POA: Diagnosis not present

## 2022-10-29 DIAGNOSIS — N182 Chronic kidney disease, stage 2 (mild): Secondary | ICD-10-CM

## 2022-10-29 DIAGNOSIS — E782 Mixed hyperlipidemia: Secondary | ICD-10-CM | POA: Diagnosis not present

## 2022-10-29 DIAGNOSIS — G3109 Other frontotemporal dementia: Secondary | ICD-10-CM

## 2022-10-29 DIAGNOSIS — F322 Major depressive disorder, single episode, severe without psychotic features: Secondary | ICD-10-CM

## 2022-10-29 DIAGNOSIS — N138 Other obstructive and reflux uropathy: Secondary | ICD-10-CM

## 2022-10-29 DIAGNOSIS — N401 Enlarged prostate with lower urinary tract symptoms: Secondary | ICD-10-CM

## 2022-10-29 DIAGNOSIS — I2510 Atherosclerotic heart disease of native coronary artery without angina pectoris: Secondary | ICD-10-CM

## 2022-10-29 MED ORDER — ALFUZOSIN HCL ER 10 MG PO TB24: 10.0000 mg | ORAL_TABLET | Freq: Every day | ORAL | 0 refills | Status: DC

## 2022-10-29 MED ORDER — FINASTERIDE 5 MG PO TABS: 5.0000 mg | ORAL_TABLET | Freq: Every day | ORAL | 0 refills | Status: DC

## 2022-10-29 NOTE — Patient Instructions (Addendum)
In order to lose weight:  1) DO A CALORIE COUNT . FOR 3 DAYS   2) INCREASE YOUR WALKING  TO BURN MORE CALORIES.  YOU MUST BE IN NEGATIVE CALORIE BALANCE TO LOSE WEIGHT    TRY DANNON LT N FIT GREEK YOGURT AND ADD WHIPPED CREAM  ONE PIECE OF CHOCOLATE DAILY  NUTS ARE HIGH IN CALORIES.  EAT THEM DURING THE DAY AND LIMIT AMOUNT TO ONE OUNCE    EARS ARE CLEAN    PNEUMONIA VACCINE GIVEN TODAY (PREVNAR 13)   The Tdap (tetanus-diphtheria-whooping cough vaccine )  IS OVERDUE .  tDAP AND and Shingrix vaccines are now  COVERED BY MEDICARE if you get them at your pharmacy .   You can expect 24 hours of flu like symptoms after receiving the shingles vaccine, so plan accordingly.

## 2022-10-29 NOTE — Progress Notes (Signed)
Subjective:  Patient ID: Edwin Woodward, male    DOB: November 22, 1950  Age: 72 y.o. MRN: [id number]  CC: The primary encounter diagnosis was HYPERTENSION, BENIGN. 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  HPI SHO DIENER presents for reestablishment of care.  Last seen In 2020   1) Seeing Dr Jeffie Pollock in March for urinary incontinence .    2) obesity: wt gain of 14 lbs since 2020.  Trying to walk more Outside. Has increased time to 15-20 minutes per  walk,  reviewed diet today:  breakfast :  eggs/potatoes  3 egg.    Or bagel with cream cheese,  or cheerios with oat milk.   Lunch is a Cytogeneticist  or a pita pocket.   3)  Dementia: FTD, managed by [facility].  Did not toelrat Aricept,  now on memantine.  Seems well adjusted,  mood is happy.  Speech is articulate.   History Sherwin has a past medical history of CAD (coronary artery disease), Cardiovascular stress test abnormal, Hyperlipidemia, Hypertension, Presence of bare metal stent in right coronary artery, and Trigger finger.   He has a past surgical history that includes Replacement total knee; Coronary artery bypass graft; Appendectomy; Carpal tunnel release; Vasectomy; Coronary angioplasty (12/2007); Appendectomy; Tonsillectomy; Cataract extraction; Joint replacement; Esophagogastroduodenoscopy (egd) with propofol (N/A, 06/29/2018);  Colonoscopy with propofol (N/A, 06/30/2018); Eye surgery; Colonoscopy with propofol (N/A, 09/14/2018); and Esophagogastroduodenoscopy (egd) with propofol (N/A, 09/14/2018).   His family history includes Diabetes in his mother; Heart disease in his father; Hypertension in his mother; Thalassemia in his mother.He reports that he has quit smoking. He has never used smokeless tobacco. He reports that he does not drink alcohol and does not use drugs.  Outpatient Medications Prior to Visit  Medication Sig Dispense Refill   Ascorbic Acid (VITAMIN C) 1000 MG tablet Take 1,000 mg by mouth daily.     aspirin EC 81 MG tablet Take 81 mg by mouth daily.     atorvastatin (LIPITOR) 40 MG tablet TAKE TABLET DAILY AT 6 PM. TAKE 1 TABLET ALTERNATING WITH 1/2 TABLET DAILY 68 tablet 2   buPROPion (WELLBUTRIN SR) 150 MG 12 hr tablet TAKE 1 TABLET BY MOUTH TWICE A DAY 60 tablet 6   calcium carbonate 1250 MG capsule Take 1,250 mg by mouth 2 (two) times daily with a meal.     citalopram (CELEXA) 20 MG tablet Take 30 mg by mouth daily.     clindamycin (CLEOCIN) 300 MG capsule TAKE 2 CAPSULES BY MOUTH 1 HR BEFORE DENTAL PROCEDURE 2 capsule 1   Cyanocobalamin (B-12 PO) Take by mouth daily.     donepezil (ARICEPT) 10 MG tablet Take 10 mg by mouth at bedtime.     magnesium oxide (MAG-OX) 400 MG tablet TAKE 1 TABLET (400 MG TOTAL) BY MOUTH 2 (TWO) TIMES DAILY AFTER A MEAL 60 tablet 3   memantine (NAMENDA) 5 MG tablet Take 5 mg by mouth 2 (two) times daily.     nitroGLYCERIN (  NITROSTAT) 0.4 MG SL tablet Place 1 tablet (0.4 mg total) under the tongue every 5 (five) minutes as needed. 90 tablet 3   nystatin (MYCOSTATIN/NYSTOP) powder Apply 1 application topically 2 (two) times daily. 60 g 0   olopatadine (PATANOL) 0.1 % ophthalmic solution Place 1 drop into both eyes 2 (two) times daily. (Patient taking differently: Place 1 drop into both eyes as needed.) 5 mL 12   zinc gluconate 50 MG tablet Take 50 mg by mouth daily.      donepezil (ARICEPT) 5 MG tablet Take 5 mg by mouth 2 (two) times daily.      risperiDONE (RISPERDAL) 0.25 MG tablet Take 0.25 mg by mouth 2 (two) times daily.      alfuzosin (UROXATRAL) 10 MG 24 hr tablet Take 1 tablet (10 mg total) by mouth daily. (Patient not taking: Reported on 10/29/2022) 90 tablet 3   finasteride (PROSCAR) 5 MG tablet Take 1 tablet (5 mg total) by mouth daily. (Patient not taking: Reported on 10/29/2022) 90 tablet 3   No facility-administered medications prior to visit.    Review of Systems:  Patient denies headache, fevers, malaise, unintentional weight loss, skin rash, eye pain, sinus congestion and sinus pain, sore throat, dysphagia,  hemoptysis , cough, dyspnea, wheezing, chest pain, palpitations, orthopnea, edema, abdominal pain, nausea, melena, diarrhea, constipation, flank pain, dysuria, hematuria, urinary  Frequency, nocturia, numbness, tingling, seizures,  Focal weakness, Loss of consciousness,  Tremor, insomnia, depression, anxiety, and suicidal ideation.     Objective:  BP 124/70   Pulse 63   Temp 98.4 F (36.9 C) (Oral)   Ht '5\' 4"'$  (1.626 m)   Wt 197 lb 9.6 oz (89.6 kg)   SpO2 96%   BMI 33.92 kg/m   Physical Exam Vitals reviewed.  Constitutional:      General: He is not in acute distress.    Appearance: Normal appearance. He is normal weight. He is not ill-appearing, toxic-appearing or diaphoretic.  HENT:     Head: Normocephalic and atraumatic.     Right Ear: Tympanic membrane, ear canal and external ear normal. There is no impacted cerumen.     Left Ear: Tympanic membrane, ear canal and external ear normal. There is no impacted cerumen.     Nose: Nose normal.     Mouth/Throat:     Mouth: Mucous membranes are moist.     Pharynx: Oropharynx is clear.  Eyes:     General: No scleral icterus.       Right eye: No discharge.        Left eye: No discharge.     Conjunctiva/sclera: Conjunctivae normal.  Neck:     Thyroid: No thyromegaly.     Vascular:  No carotid bruit or JVD.  Cardiovascular:     Rate and Rhythm: Normal rate and regular rhythm.     Heart sounds: Normal heart sounds.  Pulmonary:     Effort: Pulmonary effort is normal. No respiratory distress.     Breath sounds: Normal breath sounds.  Abdominal:     General: Bowel sounds are normal.     Palpations: Abdomen is soft. There is no mass.     Tenderness: There is no abdominal tenderness. There is no guarding or rebound.  Musculoskeletal:        General: Normal range of motion.     Cervical back: Normal range of motion and neck supple.  Lymphadenopathy:     Cervical: No cervical adenopathy.  Skin:    General: Skin  is warm and dry.  Neurological:     General: No focal deficit present.     Mental Status: He is alert and oriented to person, place, and time. Mental status is at baseline.  Psychiatric:        Mood and Affect: Mood normal.        Behavior: Behavior normal.        Thought Content: Thought content normal.        Judgment: Judgment normal.    Assessment & Plan:  HYPERTENSION, BENIGN -     Comprehensive metabolic panel -     Microalbumin / creatinine urine ratio  Mixed hyperlipidemia Assessment & Plan: Goal is LDL , 70 for history of CAD.  Tolerating Lipitor, liver enzymes normal,  No changes today   Lab Results  Component Value Date   CHOL 128 10/29/2022   HDL 40.30 10/29/2022   LDLCALC 67 10/29/2022   LDLDIRECT 71.0 10/29/2022   TRIG 103.0 10/29/2022   CHOLHDL 3 10/29/2022   Lab Results  Component Value Date   ALT 23 10/29/2022   AST 24 10/29/2022   ALKPHOS 76 10/29/2022   BILITOT 0.7 10/29/2022     Orders: -     Lipid panel -     LDL cholesterol, direct  Prostate cancer screening -     PSA, Medicare  Other fatigue -     CBC with Differential/Platelet -     TSH  Impaired fasting glucose -     Hemoglobin A1c  BPH with obstruction/lower urinary tract symptoms -     Alfuzosin HCl ER; Take 1 tablet (10 mg total) by mouth daily.   Dispense: 90 tablet; Refill: 0 -     Finasteride; Take 1 tablet (5 mg total) by mouth daily.  Dispense: 90 tablet; Refill: 0  Need for vaccination with 13-polyvalent pneumococcal conjugate vaccine -     Pneumococcal conjugate vaccine 13-valent IM  Atherosclerosis of native coronary artery of native heart without angina pectoris Assessment & Plan: With prior stent pladement.  He is asymptomatic but rather sedentary.  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) Assessment & Plan: Resolved, mood is well Controlled with current medicaations and managed n by neurology   No changes today    CKD stage G2/A1, 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 Assessment & Plan: Managed with avoidance of NSAIDs and control of BP.  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 Managed by [facility]   Class 1 obesity due to excess calories with serious comorbidity and body mass index (BMI) of 31.0 to 31.9 in adult Assessment & Plan: I have addressed  BMI and recommended a low glycemic index diet utilizing smaller more frequent meals to increase metabolism.  I have also recommended that patient start exercising with a goal of 30 minutes of aerobic exercise a minimum of 5 days per week. Screening for lipid disorders, thyroid and diabetes to be done today.   Lab Results  Component Value Date   HGBA1C 5.8 10/29/2022   Lab Results  Component Value Date   TSH 3.30 10/29/2022         Follow-up: No follow-ups on file.   I provided 30 minutes during this encounter reviewing patient's last  visit with previous provider,  most recent imaging studies and labs.  Provided counseling on the above mentioned problems and coordination of care.   [name], MD

## 2022-10-30 LAB — COMPREHENSIVE METABOLIC PANEL
ALT: 23 U/L (ref 0–53)
AST: 24 U/L (ref 0–37)
Albumin: 4.4 g/dL (ref 3.5–5.2)
Alkaline Phosphatase: 76 U/L (ref 39–117)
BUN: 17 mg/dL (ref 6–23)
CO2: 27 mEq/L (ref 19–32)
Calcium: 9.7 mg/dL (ref 8.4–10.5)
Chloride: 104 mEq/L (ref 96–112)
Creatinine, Ser: 1.23 mg/dL (ref 0.40–1.50)
GFR: 58.68 mL/min — ABNORMAL LOW (ref >60.00)
Glucose, Bld: 74 mg/dL (ref 70–99)
Potassium: 4.3 mEq/L (ref 3.5–5.1)
Sodium: 140 mEq/L (ref 135–145)
Total Bilirubin: 0.7 mg/dL (ref 0.2–1.2)
Total Protein: 6.9 g/dL (ref 6.0–8.3)

## 2022-10-30 LAB — CBC WITH DIFFERENTIAL/PLATELET
Basophils Absolute: 0 10*3/uL (ref 0.0–0.1)
Basophils Relative: 0.3 % (ref 0.0–3.0)
Eosinophils Absolute: 0.1 10*3/uL (ref 0.0–0.7)
Eosinophils Relative: 1.7 % (ref 0.0–5.0)
HCT: 39 % (ref 39.0–52.0)
Hemoglobin: 12.7 g/dL — ABNORMAL LOW (ref 13.0–17.0)
Lymphocytes Relative: 24.6 % (ref 12.0–46.0)
Lymphs Abs: 1.9 10*3/uL (ref 0.7–4.0)
MCHC: 32.5 g/dL (ref 30.0–36.0)
MCV: 65.6 fl — ABNORMAL LOW (ref 78.0–100.0)
Monocytes Absolute: 0.7 10*3/uL (ref 0.1–1.0)
Monocytes Relative: 8.9 % (ref 3.0–12.0)
Neutro Abs: 4.9 10*3/uL (ref 1.4–7.7)
Neutrophils Relative %: 64.5 % (ref 43.0–77.0)
Platelets: 236 10*3/uL (ref 150.0–400.0)
RBC: 5.94 Mil/uL — ABNORMAL HIGH (ref 4.22–5.81)
RDW: 15.5 % (ref 11.5–15.5)
WBC: 7.6 10*3/uL (ref 4.0–10.5)

## 2022-10-30 LAB — LIPID PANEL
Cholesterol: 128 mg/dL (ref 0–200)
HDL: 40.3 mg/dL (ref >39.00)
LDL Cholesterol: 67 mg/dL (ref 0–99)
NonHDL: 87.81
Total CHOL/HDL Ratio: 3
Triglycerides: 103 mg/dL (ref 0.0–149.0)
VLDL: 20.6 mg/dL (ref 0.0–40.0)

## 2022-10-30 LAB — TSH: TSH: 3.3 u[IU]/mL (ref 0.35–5.50)

## 2022-10-30 LAB — MICROALBUMIN / CREATININE URINE RATIO
Creatinine,U: 191.2 mg/dL
Microalb Creat Ratio: 0.6 mg/g (ref 0.0–30.0)
Microalb, Ur: 1.1 mg/dL (ref 0.0–1.9)

## 2022-10-30 LAB — HEMOGLOBIN A1C: Hgb A1c MFr Bld: 5.8 % (ref 4.6–6.5)

## 2022-10-30 LAB — LDL CHOLESTEROL, DIRECT: Direct LDL: 71 mg/dL

## 2022-10-30 LAB — PSA, MEDICARE: PSA: 0.3 ng/ml (ref 0.10–4.00)

## 2022-10-30 NOTE — Telephone Encounter (Signed)
Called and spoke to Edwin Woodward she was very upset that we were unable to refill pts rx. She states that the phone call with front desk was not helpful and there was a lack of concern.   Upon looking through pts chart [facility] refilled the meds on 08/15/21 with a year refill. Edwin Woodward was [facility].  [facility] [name] went 1 week w/o meds. PCP did refill and pt has an appt with [facility] in 3/24.   I apologized to pt for not meeting his expectation and thanked her for the feedback. Edwin Woodward was  appreciative of call.

## 2022-11-01 NOTE — Assessment & Plan Note (Signed)
Managed with avoidance of NSAIDs and control of BP.  Lab Results  Component Value Date   CREATININE 1.23 10/29/2022     Lab Results  Component Value Date   MICROALBUR 1.1 10/29/2022

## 2022-11-01 NOTE — Assessment & Plan Note (Signed)
Goal is LDL , 70 for history of CAD.  Tolerating Lipitor, liver enzymes normal,  No changes today   Lab Results  Component Value Date   CHOL 128 10/29/2022   HDL 40.30 10/29/2022   LDLCALC 67 10/29/2022   LDLDIRECT 71.0 10/29/2022   TRIG 103.0 10/29/2022   CHOLHDL 3 10/29/2022   Lab Results  Component Value Date   ALT 23 10/29/2022   AST 24 10/29/2022   ALKPHOS 76 10/29/2022   BILITOT 0.7 10/29/2022

## 2022-11-01 NOTE — Assessment & Plan Note (Signed)
I have addressed  BMI and recommended a low glycemic index diet utilizing smaller more frequent meals to increase metabolism.  I have also recommended that patient start exercising with a goal of 30 minutes of aerobic exercise a minimum of 5 days per week. Screening for lipid disorders, thyroid and diabetes to be done today.   Lab Results  Component Value Date   HGBA1C 5.8 10/29/2022   Lab Results  Component Value Date   TSH 3.30 10/29/2022

## 2022-11-01 NOTE — Assessment & Plan Note (Signed)
Resolved, mood is well Controlled with current medicaations and managed n by neurology   No changes today

## 2022-11-01 NOTE — Assessment & Plan Note (Signed)
With prior stent pladement.  He is asymptomatic but rather sedentary.  Continue

## 2022-11-01 NOTE — Assessment & Plan Note (Signed)
Currenlty taking aricept and memantine. Managed by [facility]

## 2023-03-14 ENCOUNTER — Other Ambulatory Visit: Payer: Self-pay | Admitting: Cardiovascular Disease

## 2023-03-16 ENCOUNTER — Telehealth: Payer: Self-pay | Admitting: Cardiovascular Disease

## 2023-03-16 NOTE — Telephone Encounter (Signed)
Unable to leave voice mail. Patient needs to schedule follow up appt from recall

## 2023-03-16 NOTE — Telephone Encounter (Signed)
Please contact pt for future appointment. °Pt due for 12 month f/u. °

## 2023-03-16 NOTE — Telephone Encounter (Signed)
Unable to leave voice mail, needs to schedule appt

## 2023-03-18 MED ORDER — ATORVASTATIN CALCIUM 40 MG PO TABS: ORAL_TABLET | ORAL | 0 refills | Status: DC

## 2023-03-19 ENCOUNTER — Telehealth: Payer: Self-pay | Admitting: Cardiovascular Disease

## 2023-03-19 NOTE — Telephone Encounter (Signed)
Left voice mail to schedule appt from recall and to refill meds

## 2023-03-20 ENCOUNTER — Encounter: Payer: Self-pay | Admitting: Cardiovascular Disease

## 2023-03-20 NOTE — Telephone Encounter (Signed)
Called 3x, unable to reach letter sent to patient.

## 2023-03-20 NOTE — Telephone Encounter (Signed)
Pt is scheduled on 9/10.

## 2023-03-20 NOTE — Telephone Encounter (Signed)
Pt called 3x, unable to reach letter sent to patient via mail

## 2023-03-20 NOTE — Telephone Encounter (Signed)
Patient's wife is returning call. Please return call to [phone].

## 2023-03-23 ENCOUNTER — Telehealth: Payer: Self-pay | Admitting: Internal Medicine

## 2023-03-23 NOTE — Telephone Encounter (Signed)
Pt caretaker would like to be called regarding pt overall health

## 2023-03-25 ENCOUNTER — Encounter: Payer: Self-pay | Admitting: Internal Medicine

## 2023-03-25 NOTE — Telephone Encounter (Signed)
Spoke with Edwin Woodward and she is wanting to schedule an appointment for pt to discuss the need for home health. She stated that pt is not wanting to do anything any more. She stated that she has to be with his all the time to make sure he takes care of himself like bathing, brushing his teeth, cleaning. They scheduled an appt for 04/30/2023 but she doesn't think they should wait that long. Would it be okay to schedule them in a hospital follow up slot or a 4:30 slot? Can it be virtual or should it be in person?

## 2023-03-26 NOTE — Telephone Encounter (Signed)
Pt is scheduled for next Tuesday. Caregiver is aware.

## 2023-03-26 NOTE — Telephone Encounter (Signed)
Spoke with pt's caregiver and schedule an appt for next Tuesday.

## 2023-03-31 ENCOUNTER — Encounter: Payer: Self-pay | Admitting: Internal Medicine

## 2023-03-31 ENCOUNTER — Ambulatory Visit: Payer: Medicare HMO | Admitting: Internal Medicine

## 2023-03-31 VITALS — BP 116/78 | HR 73 | Temp 98.2°F | Ht 64.0 in | Wt 193.4 lb

## 2023-03-31 DIAGNOSIS — N138 Other obstructive and reflux uropathy: Secondary | ICD-10-CM | POA: Diagnosis not present

## 2023-03-31 DIAGNOSIS — F0280 Dementia in other diseases classified elsewhere without behavioral disturbance: Secondary | ICD-10-CM

## 2023-03-31 DIAGNOSIS — N401 Enlarged prostate with lower urinary tract symptoms: Secondary | ICD-10-CM | POA: Diagnosis not present

## 2023-03-31 DIAGNOSIS — M6281 Muscle weakness (generalized): Secondary | ICD-10-CM | POA: Diagnosis not present

## 2023-03-31 MED ORDER — ZOSTER VAC RECOMB ADJUVANTED 50 MCG/0.5ML IM SUSR: 0.5000 mL | Freq: Once | INTRAMUSCULAR | 1 refills | Status: AC

## 2023-03-31 MED ORDER — ALFUZOSIN HCL ER 10 MG PO TB24: 10.0000 mg | ORAL_TABLET | Freq: Every day | ORAL | 0 refills | Status: DC

## 2023-03-31 MED ORDER — TETANUS-DIPHTH-ACELL PERTUSSIS 5-2.5-18.5 LF-MCG/0.5 IM SUSY: 0.5000 mL | PREFILLED_SYRINGE | Freq: Once | INTRAMUSCULAR | 0 refills | Status: AC

## 2023-03-31 MED ORDER — FINASTERIDE 5 MG PO TABS: 5.0000 mg | ORAL_TABLET | Freq: Every day | ORAL | 0 refills | Status: DC

## 2023-03-31 NOTE — Patient Instructions (Signed)
I have ordered Home Health to come out and provide physical therapy and a home health aide

## 2023-03-31 NOTE — Progress Notes (Unsigned)
Subjective:  Patient ID: Edwin Woodward, male    DOB: 04/13/1951  Age: 72 y.o. 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 Diagnoses of BPH with obstruction/lower urinary tract symptoms and Muscle weakness of lower extremity were also pertinent to this visit.   HPI TONIO SEIDER presents for follow up on dementia Chief Complaint  Patient presents with   Medical Management of Chronic Issues    Discuss the need to home health services    72  yr old male with FTD , managed by [facility],  Hypertension,  GAD, presents with need for additional assistance at home.  Patient no longer managing his ADLs and he is fearful of his deterioration   1) trouble getting up from certain chairs  toilets.  Feels his proximal leg muscles have become weak. His  caregiver has ordered a special cane with two levels of support  /grip .  Home PT for proximal leg weakness a.  He has not had any falls.   The bed is higher so he can get out of bed .  He does not wear a medic alert any more because he wore it for 6 years and had 3 occasions when he needed it and didn't use it.   2) having trouble shaving himself even with an electric razor and needs an aide to help with grooming and bathing . He  has had 4 grab bars installed for a 360 degree effect . He feels weak in the morning and refers to shower before dinner ,  around 4 :30    Outpatient Medications Prior to Visit  Medication Sig Dispense Refill   Ascorbic Acid (VITAMIN C) 1000 MG tablet Take 1,000 mg by mouth daily.     aspirin EC 81 MG tablet Take 81 mg by mouth daily.     atorvastatin (LIPITOR) 40 MG tablet TAKE TABLET DAILY AT 6 PM. TAKE 1 TABLET ALTERNATING WITH 1/2 TABLET DAILY/PLEASE CALL OFFICE TO SCHEDULE APPOINTMENT PRIOR TO NEXT REFILL (1st attempt) 23 tablet 0   buPROPion (WELLBUTRIN SR) 150 MG 12 hr tablet TAKE 1 TABLET BY MOUTH TWICE A DAY 60 tablet 6    calcium carbonate 1250 MG capsule Take 1,250 mg by mouth 2 (two) times daily with a meal.     citalopram (CELEXA) 20 MG tablet Take 30 mg by mouth daily.     clindamycin (CLEOCIN) 300 MG capsule TAKE 2 CAPSULES BY MOUTH 1 HR BEFORE DENTAL PROCEDURE 2 capsule 1   Cyanocobalamin (B-12 PO) Take by mouth daily.     donepezil (ARICEPT) 10 MG tablet Take 10 mg by mouth at bedtime.     magnesium oxide (MAG-OX) 400 MG tablet TAKE 1 TABLET (400 MG TOTAL) BY MOUTH 2 (TWO) TIMES DAILY AFTER A MEAL 60 tablet 3   memantine (NAMENDA) 5 MG tablet Take 5 mg by mouth 2 (two) times daily.     nitroGLYCERIN (NITROSTAT) 0.4 MG SL tablet Place 1 tablet (0.4 mg total) under the tongue every 5 (five) minutes as needed. 90 tablet 3   nystatin (MYCOSTATIN/NYSTOP) powder Apply 1 application topically 2 (two) times daily. 60 g 0   olopatadine (PATANOL) 0.1 % ophthalmic solution Place 1 drop into both eyes 2 (two) times daily. (Patient taking differently: Place 1 drop into both eyes as needed.) 5 mL 12   zinc gluconate 50 MG tablet Take 50 mg by mouth daily.  alfuzosin (UROXATRAL) 10 MG 24 hr tablet Take 1 tablet (10 mg total) by mouth daily. 90 tablet 0   finasteride (PROSCAR) 5 MG tablet Take 1 tablet (5 mg total) by mouth daily. 90 tablet 0   risperiDONE (RISPERDAL) 0.25 MG tablet Take 0.25 mg by mouth 2 (two) times daily.      No facility-administered medications prior to visit.    Review of Systems;  Patient denies headache, fevers, malaise, unintentional weight loss, skin rash, eye pain, sinus congestion and sinus pain, sore throat, dysphagia,  hemoptysis , cough, dyspnea, wheezing, chest pain, palpitations, orthopnea, edema, abdominal pain, nausea, melena, diarrhea, constipation, flank pain, dysuria, hematuria, urinary  Frequency, nocturia, numbness, tingling, seizures,  Focal weakness, Loss of consciousness,  Tremor, insomnia, depression, anxiety, and suicidal ideation.      Objective:  BP 116/78   Pulse  73   Temp 98.2 F (36.8 C) (Oral)   Ht 5\' 4"  (1.626 m)   Wt 193 lb 6.4 oz (87.7 kg)   SpO2 95%   BMI 33.20 kg/m   BP Readings from Last 3 Encounters:  03/31/23 116/78  10/29/22 124/70  04/01/22 110/60    Wt Readings from Last 3 Encounters:  03/31/23 193 lb 6.4 oz (87.7 kg)  10/29/22 197 lb 9.6 oz (89.6 kg)  04/01/22 192 lb 6 oz (87.3 kg)    Physical Exam Vitals reviewed.  Constitutional:      General: He is not in acute distress.    Appearance: Normal appearance. He is normal weight. He is not ill-appearing, toxic-appearing or diaphoretic.  HENT:     Head: Normocephalic.  Eyes:     General: No scleral icterus.       Right eye: No discharge.        Left eye: No discharge.     Conjunctiva/sclera: Conjunctivae normal.  Cardiovascular:     Rate and Rhythm: Normal rate and regular rhythm.     Heart sounds: Normal heart sounds.  Pulmonary:     Effort: Pulmonary effort is normal. No respiratory distress.     Breath sounds: Normal breath sounds.  Musculoskeletal:        General: Normal range of motion.     Cervical back: Normal range of motion.  Skin:    General: Skin is warm and dry.  Neurological:     General: No focal deficit present.     Mental Status: He is alert and oriented to person, place, and time. Mental status is at baseline.  Psychiatric:        Mood and Affect: Mood normal.        Behavior: Behavior normal.        Thought Content: Thought content normal.        Judgment: Judgment normal.   Lab Results  Component Value Date   HGBA1C 5.8 10/29/2022    Lab Results  Component Value Date   CREATININE 1.25 03/31/2023   CREATININE 1.23 10/29/2022   CREATININE 1.31 (H) 02/17/2020    Lab Results  Component Value Date   WBC 7.6 10/29/2022   HGB 12.7 (L) 10/29/2022   HCT 39.0 10/29/2022   PLT 236.0 10/29/2022   GLUCOSE 81 03/31/2023   CHOL 128 10/29/2022   TRIG 103.0 10/29/2022   HDL 40.30 10/29/2022   LDLDIRECT 71.0 10/29/2022   LDLCALC 67  10/29/2022   ALT 33 03/31/2023   AST 26 03/31/2023   NA 139 03/31/2023   K 4.6 03/31/2023   CL 104 03/31/2023  CREATININE 1.25 03/31/2023   BUN 21 03/31/2023   CO2 26 03/31/2023   TSH 2.44 03/31/2023   PSA 0.30 10/29/2022   INR 1.16 06/26/2018   HGBA1C 5.8 10/29/2022   MICROALBUR 1.1 10/29/2022    No results found.  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 Managed by [facility]   BPH with obstruction/lower urinary tract symptoms -     Alfuzosin HCl ER; Take 1 tablet (10 mg total) by mouth daily.  Dispense: 90 tablet; Refill: 0 -     Finasteride; Take 1 tablet (5 mg total) by mouth daily.  Dispense: 90 tablet; Refill: 0  Muscle weakness of lower extremity Assessment & Plan: Secondary to sedentary lifestyle and dementia . He is fearful of falling . HOme PT ordered  Orders: -     Ambulatory referral to Home Health -     Comprehensive metabolic panel -     CK -     TSH -     Magnesium  Other orders -     Tetanus-Diphth-Acell Pertussis; Inject 0.5 mLs into the muscle once for 1 dose.  Dispense: 0.5 mL; Refill: 0 -     Zoster Vac Recomb Adjuvanted; Inject 0.5 mLs into the muscle once for 1 dose.  Dispense: 1 each; Refill: 1    Follow-up: Return in about 6 months (around 10/01/2023).   Sherlene Shams, MD

## 2023-04-01 LAB — COMPREHENSIVE METABOLIC PANEL
ALT: 33 U/L (ref 0–53)
AST: 26 U/L (ref 0–37)
Albumin: 4.3 g/dL (ref 3.5–5.2)
Alkaline Phosphatase: 63 U/L (ref 39–117)
BUN: 21 mg/dL (ref 6–23)
CO2: 26 mEq/L (ref 19–32)
Calcium: 9.3 mg/dL (ref 8.4–10.5)
Chloride: 104 mEq/L (ref 96–112)
Creatinine, Ser: 1.25 mg/dL (ref 0.40–1.50)
GFR: 57.39 mL/min — ABNORMAL LOW (ref >60.00)
Glucose, Bld: 81 mg/dL (ref 70–99)
Potassium: 4.6 mEq/L (ref 3.5–5.1)
Sodium: 139 mEq/L (ref 135–145)
Total Bilirubin: 0.8 mg/dL (ref 0.2–1.2)
Total Protein: 6.7 g/dL (ref 6.0–8.3)

## 2023-04-01 LAB — MAGNESIUM: Magnesium: 1.9 mg/dL (ref 1.5–2.5)

## 2023-04-01 LAB — CK: Total CK: 78 U/L (ref 7–232)

## 2023-04-01 LAB — TSH: TSH: 2.44 u[IU]/mL (ref 0.35–5.50)

## 2023-04-01 NOTE — Assessment & Plan Note (Signed)
Currenlty taking aricept and memantine. Managed by [facility]

## 2023-04-01 NOTE — Assessment & Plan Note (Signed)
Secondary to sedentary lifestyle and dementia . He is fearful of falling . HOme PT ordered

## 2023-04-09 ENCOUNTER — Other Ambulatory Visit: Payer: Self-pay | Admitting: Cardiovascular Disease

## 2023-04-13 DIAGNOSIS — Z79899 Other long term (current) drug therapy: Secondary | ICD-10-CM

## 2023-04-13 DIAGNOSIS — M6281 Muscle weakness (generalized): Secondary | ICD-10-CM

## 2023-04-13 DIAGNOSIS — I10 Essential (primary) hypertension: Secondary | ICD-10-CM

## 2023-04-13 DIAGNOSIS — N138 Other obstructive and reflux uropathy: Secondary | ICD-10-CM

## 2023-04-13 DIAGNOSIS — F0283 Dementia in other diseases classified elsewhere, unspecified severity, with mood disturbance: Secondary | ICD-10-CM

## 2023-04-13 DIAGNOSIS — F411 Generalized anxiety disorder: Secondary | ICD-10-CM

## 2023-04-13 DIAGNOSIS — N401 Enlarged prostate with lower urinary tract symptoms: Secondary | ICD-10-CM

## 2023-04-13 DIAGNOSIS — Z604 Social exclusion and rejection: Secondary | ICD-10-CM

## 2023-04-13 DIAGNOSIS — Z7982 Long term (current) use of aspirin: Secondary | ICD-10-CM

## 2023-04-13 DIAGNOSIS — H9193 Unspecified hearing loss, bilateral: Secondary | ICD-10-CM

## 2023-04-13 DIAGNOSIS — G3109 Other frontotemporal dementia: Secondary | ICD-10-CM

## 2023-04-13 DIAGNOSIS — Z556 Problems related to health literacy: Secondary | ICD-10-CM

## 2023-04-13 DIAGNOSIS — F0284 Dementia in other diseases classified elsewhere, unspecified severity, with anxiety: Secondary | ICD-10-CM

## 2023-04-30 ENCOUNTER — Ambulatory Visit: Payer: Medicare HMO | Admitting: Internal Medicine

## 2023-05-05 ENCOUNTER — Telehealth: Payer: Self-pay | Admitting: Internal Medicine

## 2023-05-05 NOTE — Telephone Encounter (Signed)
Patient needs verbal order to move the next medicare appointment to another week. Could someone give them a call at [phone]. Could someone give his daughter a call.

## 2023-05-08 NOTE — Telephone Encounter (Signed)
LM giving the verbal order

## 2023-05-18 NOTE — Progress Notes (Unsigned)
Cardiology Office Note  Date:  05/18/2023   ID:  Edwin Woodward, Edwin Woodward [birth year], MRN [id number]  PCP:  Sherlene Shams, MD   No chief complaint on file.   HPI:  Edwin Woodward is a very pleasant 72 y.o. gentleman who has a history of  coronary artery disease,  bare-metal stents placed to his proximal RCA and LAD in April 2009, Gout episodes x 2 (2015, 2016) total knee replacements bilaterally,  hypertension,  hyperlipidemia,  anxiety ADHD  Frontal lobe dementia who presents for routine followup of his coronary artery disease  Last seen by myself in clinic July 2023   accompanied by his ex-wife, She does the shopping  In follow-up today reports no significant changes Denies shortness of breath or chest pain on exertion No regular exercise program PVCs noted on EKG, denies symptoms of  palpitations, No leg edema, no PND orthopnea No tachypalpitations  Weight stable, down 2 pounds Cut back on ice cream apart from the summer  Reports poor sleep, restless Naps in day  No recent labs  EKG personally reviewed by myself on todays visit Shows Sinus rhythm with rate 70 bpm right bundle branch block, left anterior fascicular block, no change, PVCs noted  EKG has looked the same for the past 10 years.      PMH:   has a past medical history of CAD (coronary artery disease), Cardiovascular stress test abnormal, Hyperlipidemia, Hypertension, Presence of bare metal stent in right coronary artery, and Trigger finger.  PSH:    Past Surgical History:  Procedure Laterality Date   APPENDECTOMY     APPENDECTOMY     CARPAL TUNNEL RELEASE     CATARACT EXTRACTION     right   COLONOSCOPY WITH PROPOFOL N/A 06/30/2018   Procedure: COLONOSCOPY WITH PROPOFOL;  Surgeon: Wyline Mood, MD;  Location: [facility] ENDOSCOPY;  Service: Gastroenterology;  Laterality: N/A;   COLONOSCOPY WITH PROPOFOL N/A 09/14/2018   Procedure: COLONOSCOPY WITH PROPOFOL;  Surgeon: Wyline Mood, MD;  Location: [facility]  ENDOSCOPY;  Service: Gastroenterology;  Laterality: N/A;   CORONARY ANGIOPLASTY  12/2007   CORONARY ARTERY BYPASS GRAFT     2 vessel   ESOPHAGOGASTRODUODENOSCOPY (EGD) WITH PROPOFOL N/A 06/29/2018   Procedure: ESOPHAGOGASTRODUODENOSCOPY (EGD) WITH PROPOFOL;  Surgeon: Wyline Mood, MD;  Location: [facility] ENDOSCOPY;  Service: Gastroenterology;  Laterality: N/A;   ESOPHAGOGASTRODUODENOSCOPY (EGD) WITH PROPOFOL N/A 09/14/2018   Procedure: ESOPHAGOGASTRODUODENOSCOPY (EGD) WITH PROPOFOL;  Surgeon: Wyline Mood, MD;  Location: [facility] ENDOSCOPY;  Service: Gastroenterology;  Laterality: N/A;   EYE SURGERY     JOINT REPLACEMENT     total knee   REPLACEMENT TOTAL KNEE     TONSILLECTOMY     VASECTOMY      Current Outpatient Medications  Medication Sig Dispense Refill   alfuzosin (UROXATRAL) 10 MG 24 hr tablet Take 1 tablet (10 mg total) by mouth daily. 90 tablet 0   Ascorbic Acid (VITAMIN C) 1000 MG tablet Take 1,000 mg by mouth daily.     aspirin EC 81 MG tablet Take 81 mg by mouth daily.     atorvastatin (LIPITOR) 40 MG tablet TAKE 1 TABLET DAILY AT 6PM ALTERNATING WITH 1/2 TABLET DAILY/PLEASE SCHEDULE APPOINTMENT 68 tablet 0   buPROPion (WELLBUTRIN SR) 150 MG 12 hr tablet TAKE 1 TABLET BY MOUTH TWICE A DAY 60 tablet 6   calcium carbonate 1250 MG capsule Take 1,250 mg by mouth 2 (two) times daily with a meal.     citalopram (CELEXA) 20 MG tablet Take  30 mg by mouth daily.     clindamycin (CLEOCIN) 300 MG capsule TAKE 2 CAPSULES BY MOUTH 1 HR BEFORE DENTAL PROCEDURE 2 capsule 1   Cyanocobalamin (B-12 PO) Take by mouth daily.     donepezil (ARICEPT) 10 MG tablet Take 10 mg by mouth at bedtime.     finasteride (PROSCAR) 5 MG tablet Take 1 tablet (5 mg total) by mouth daily. 90 tablet 0   magnesium oxide (MAG-OX) 400 MG tablet TAKE 1 TABLET (400 MG TOTAL) BY MOUTH 2 (TWO) TIMES DAILY AFTER A MEAL 60 tablet 3   memantine (NAMENDA) 5 MG tablet Take 5 mg by mouth 2 (two) times daily.     nitroGLYCERIN  (NITROSTAT) 0.4 MG SL tablet Place 1 tablet (0.4 mg total) under the tongue every 5 (five) minutes as needed. 90 tablet 3   nystatin (MYCOSTATIN/NYSTOP) powder Apply 1 application topically 2 (two) times daily. 60 g 0   olopatadine (PATANOL) 0.1 % ophthalmic solution Place 1 drop into both eyes 2 (two) times daily. (Patient taking differently: Place 1 drop into both eyes as needed.) 5 mL 12   risperiDONE (RISPERDAL) 0.25 MG tablet Take 0.25 mg by mouth 2 (two) times daily.      zinc gluconate 50 MG tablet Take 50 mg by mouth daily.     No current facility-administered medications for this visit.     Allergies:   Penicillins, Clarithromycin, Clarithromycin, Promethazine, Codeine, Iodinated contrast media, and Metrizamide   Social History:  The patient  reports that he has quit smoking. He has never used smokeless tobacco. He reports that he does not drink alcohol and does not use drugs.   Family History:   family history includes Diabetes in his mother; Heart disease in his father; Hypertension in his mother; Thalassemia in his mother.    Review of Systems: Review of Systems  Constitutional: Negative.   HENT: Negative.    Eyes: Negative.   Respiratory: Negative.    Cardiovascular: Negative.  Negative for chest pain.  Gastrointestinal: Negative.   Genitourinary: Negative.   Musculoskeletal: Negative.   Neurological: Negative.   Psychiatric/Behavioral: Negative.    All other systems reviewed and are negative.   PHYSICAL EXAM: VS:  There were no vitals taken for this visit. , BMI There is no height or weight on file to calculate BMI. Constitutional:  oriented to person, place, and time. No distress.  HENT:  Head: Grossly normal Eyes:  no discharge. No scleral icterus.  Neck: No JVD, no carotid bruits  Cardiovascular: Regular rate and rhythm, no murmurs appreciated Pulmonary/Chest: Clear to auscultation bilaterally, no wheezes or rails Abdominal: Soft.  no distension.  no  tenderness.  Musculoskeletal: Normal range of motion Neurological:  normal muscle tone. Coordination normal. No atrophy Skin: Skin warm and dry Psychiatric: normal affect, pleasant  Recent Labs: 10/29/2022: Hemoglobin 12.7; Platelets 236.0 03/31/2023: ALT 33; BUN 21; Creatinine, Ser 1.25; Magnesium 1.9; Potassium 4.6; Sodium 139; TSH 2.44    Lipid Panel Lab Results  Component Value Date   CHOL 128 10/29/2022   HDL 40.30 10/29/2022   LDLCALC 67 10/29/2022   TRIG 103.0 10/29/2022      Wt Readings from Last 3 Encounters:  03/31/23 193 lb 6.4 oz (87.7 kg)  10/29/22 197 lb 9.6 oz (89.6 kg)  04/01/22 192 lb 6 oz (87.3 kg)     ASSESSMENT AND PLAN:  Pure hypercholesterolemia Repeat lipid panel ordered Prior lipid panel well-controlled, still on a statin  Hypotension/orthostasis Low blood pressure,  denies any symptoms of orthostasis  Atherosclerosis of native coronary artery without angina pectoris, unspecified whether native or transplanted heart Non-smoker, no significant diabetes, cholesterol at goal Currently with no symptoms of angina. No further workup at this time. Continue current medication regimen. Exercise recommended  Class 1 obesity due to excess calories with serious comorbidity and body mass index (BMI) of 31.0 to 31.9 in adult Calorie restriction and walking program suggested  Bradycardia/PVCs Beta-blocker previously held, not an active issue PVCs noted but asymptomatic  GAD (generalized anxiety disorder) Doing well, has good supports from ex-wife  CKD (chronic kidney disease) stage 3, GFR 30-59 ml/min Lab work ordered  Memory loss Assistance from ex-wife, friends Stable    Total encounter time more than 30 minutes  Greater than 50% was spent in counseling and coordination of care with the patient    No orders of the defined types were placed in this encounter.    Signed, Dossie Arbour, M.D., Ph.D. 05/18/2023  [facility] [place],  [place] [phone]

## 2023-05-19 ENCOUNTER — Encounter: Payer: Self-pay | Admitting: Cardiovascular Disease

## 2023-05-19 ENCOUNTER — Ambulatory Visit: Payer: Medicare HMO | Attending: Cardiovascular Disease | Admitting: Cardiovascular Disease

## 2023-05-19 VITALS — BP 130/70 | HR 82 | Ht 63.0 in | Wt 193.5 lb

## 2023-05-19 DIAGNOSIS — E782 Mixed hyperlipidemia: Secondary | ICD-10-CM

## 2023-05-19 DIAGNOSIS — I25118 Atherosclerotic heart disease of native coronary artery with other forms of angina pectoris: Secondary | ICD-10-CM

## 2023-05-19 DIAGNOSIS — N183 Chronic kidney disease, stage 3 unspecified: Secondary | ICD-10-CM | POA: Diagnosis not present

## 2023-05-19 DIAGNOSIS — I10 Essential (primary) hypertension: Secondary | ICD-10-CM | POA: Diagnosis not present

## 2023-05-19 MED ORDER — ATORVASTATIN CALCIUM 40 MG PO TABS: ORAL_TABLET | ORAL | 3 refills | Status: DC

## 2023-05-19 NOTE — Patient Instructions (Addendum)
[facility] Classes are offered at two locations:  [employer] in [place] (for more information, contact Florencia Reasons at [phone] or email [email or web address]  [facility] at [facility] (class is open to the public -- for more information, contact Arn Medal at [phone] or email [email or web address]).   Medication Instructions:  Please increase the lipitor up to 40 daily  If you need a refill on your cardiac medications before your next appointment, please call your pharmacy.   Lab work: No new labs needed  Testing/Procedures: No new testing needed  Follow-Up: At [facility], you and your health needs are our priority.  As part of our continuing mission to provide you with exceptional heart care, we have created designated Provider Care Teams.  These Care Teams include your primary Cardiologist (physician) and Advanced Practice Providers (APPs -  Physician Assistants and Nurse Practitioners) who all work together to provide you with the care you need, when you need it.  You will need a follow up appointment in 12 months  Providers on your designated Care Team:   Nicolasa Ducking, NP Eula Listen, PA-C Cadence Fransico Michael, [place]  COVID-19 Vaccine Information can be found at: [email or web address] For questions related to vaccine distribution or appointments, please email [email or web address] or call [phone].

## 2023-05-20 ENCOUNTER — Other Ambulatory Visit: Payer: Self-pay | Admitting: Internal Medicine

## 2023-05-20 DIAGNOSIS — N138 Other obstructive and reflux uropathy: Secondary | ICD-10-CM

## 2023-05-26 ENCOUNTER — Ambulatory Visit: Payer: Medicare HMO | Attending: Internal Medicine

## 2023-05-26 ENCOUNTER — Telehealth: Payer: Self-pay

## 2023-05-26 ENCOUNTER — Telehealth: Payer: Self-pay | Admitting: Internal Medicine

## 2023-05-26 DIAGNOSIS — R001 Bradycardia, unspecified: Secondary | ICD-10-CM | POA: Diagnosis not present

## 2023-05-26 DIAGNOSIS — I4510 Unspecified right bundle-branch block: Secondary | ICD-10-CM | POA: Diagnosis not present

## 2023-05-26 DIAGNOSIS — I447 Left bundle-branch block, unspecified: Secondary | ICD-10-CM | POA: Diagnosis not present

## 2023-05-26 NOTE — Telephone Encounter (Signed)
Spoke with Kenney Houseman from [facility] and she stated that pt was not symptomatic when she got the low heart rate reading. No chest pain, no SOBr, and was not lethargic.

## 2023-05-26 NOTE — Telephone Encounter (Signed)
Patient's wife, Echo Topp, called to follow-up on the report of a low heart rate for patient via [facility].  I read messages to patient, as she states [facility] contacted [place] directly and did not give her the information.  Carley Hammed states she would like for Sandy Salaam, CMA, to please call her at [phone] for today, as her home phone is down because of the weather.  Carley Hammed states she is unavailable today from 2:30-3:45pm as she has a doctor's appointment.  Carley Hammed states she would like to discuss the ZIO monitor for patient.

## 2023-05-26 NOTE — Telephone Encounter (Signed)
Tanya from [facility] called stating the pt has a low heart rate. Kenney Houseman stated it was 51 and then it went to 44. When she switched to his pinky it was 59 and went down to 54

## 2023-05-26 NOTE — Telephone Encounter (Signed)
NEEDS ZIO MONITOR,  PLEASE SEE OTHER REPLY

## 2023-05-26 NOTE — Telephone Encounter (Signed)
Spoke with pt's wife and explained everything to her. Wife gave a verbal understanding.

## 2023-05-27 ENCOUNTER — Emergency Department
Admission: EM | Admit: 2023-05-27 | Discharge: 2023-05-28 | Disposition: A | Discharge status: Home / Self Care | Payer: Medicare HMO | Attending: Emergency Medicine | Admitting: Emergency Medicine

## 2023-05-27 ENCOUNTER — Other Ambulatory Visit: Payer: Self-pay

## 2023-05-27 DIAGNOSIS — I2510 Atherosclerotic heart disease of native coronary artery without angina pectoris: Secondary | ICD-10-CM | POA: Diagnosis not present

## 2023-05-27 DIAGNOSIS — R5383 Other fatigue: Secondary | ICD-10-CM | POA: Diagnosis not present

## 2023-05-27 DIAGNOSIS — Z20822 Contact with and (suspected) exposure to covid-19: Secondary | ICD-10-CM | POA: Diagnosis not present

## 2023-05-27 DIAGNOSIS — I10 Essential (primary) hypertension: Secondary | ICD-10-CM | POA: Insufficient documentation

## 2023-05-27 DIAGNOSIS — R531 Weakness: Secondary | ICD-10-CM | POA: Diagnosis present

## 2023-05-27 LAB — BASIC METABOLIC PANEL WITH GFR
Anion gap: 7 (ref 5–15)
BUN: 22 mg/dL (ref 8–23)
CO2: 22 mmol/L (ref 22–32)
Calcium: 8.4 mg/dL — ABNORMAL LOW (ref 8.9–10.3)
Chloride: 108 mmol/L (ref 98–111)
Creatinine, Ser: 1.07 mg/dL (ref 0.61–1.24)
GFR, Estimated: >60 mL/min (ref >60)
Glucose, Bld: 84 mg/dL (ref 70–99)
Potassium: 3.9 mmol/L (ref 3.5–5.1)
Sodium: 137 mmol/L (ref 135–145)

## 2023-05-27 LAB — CBC
HCT: 38.6 % — ABNORMAL LOW (ref 39.0–52.0)
Hemoglobin: 11.9 g/dL — ABNORMAL LOW (ref 13.0–17.0)
MCH: 20.9 pg — ABNORMAL LOW (ref 26.0–34.0)
MCHC: 30.8 g/dL (ref 30.0–36.0)
MCV: 67.8 fL — ABNORMAL LOW (ref 80.0–100.0)
Platelets: 205 10*3/uL (ref 150–400)
RBC: 5.69 MIL/uL (ref 4.22–5.81)
RDW: 16.5 % — ABNORMAL HIGH (ref 11.5–15.5)
WBC: 8 10*3/uL (ref 4.0–10.5)
nRBC: 0 % (ref 0.0–0.2)

## 2023-05-27 LAB — CBG MONITORING, ED: Glucose-Capillary: 79 mg/dL (ref 70–99)

## 2023-05-27 LAB — TROPONIN I (HIGH SENSITIVITY): Troponin I (High Sensitivity): 7 ng/L (ref <18)

## 2023-05-27 LAB — SARS CORONAVIRUS 2 BY RT PCR: SARS Coronavirus 2 by RT PCR: NEGATIVE

## 2023-05-27 MED ORDER — SODIUM CHLORIDE 0.9 % IV BOLUS
1000.0000 mL | Freq: Once | INTRAVENOUS | Status: AC
  Administered 2023-05-27: 1000 mL via INTRAVENOUS

## 2023-05-27 NOTE — ED Provider Notes (Signed)
   [facility] Provider Note    Event Date/Time   First MD Initiated Contact with Patient 05/27/23 2201     (approximate)  History   Chief Complaint: Fatigue  HPI  Edwin Woodward is a 72 y.o. male with a past medical history of CAD, hypertension, hyperlipidemia, presents to the emergency department for generalized weakness.  According to the patient patient is coming from home for generalized weakness and fatigue over the last 2 to 3 days.  Patient denies any fever cough congestion nausea vomiting diarrhea chest pain or abdominal pain.  States just a sense of generalized fatigue and weakness.  Physical Exam   Triage Vital Signs: ED Triage Vitals  Encounter Vitals Group     BP 05/27/23 1758 (!) 130/94     Systolic BP Percentile --      Diastolic BP Percentile --      Pulse Rate 05/27/23 1758 (!) 58     Resp 05/27/23 1758 16     Temp 05/27/23 1758 (!) 97.5 F (36.4 C)     Temp Source 05/27/23 1758 Oral     SpO2 05/27/23 1758 97 %     Weight 05/27/23 1757 193 lb (87.5 kg)     Height 05/27/23 1757 5\' 3"  (1.6 m)     Head Circumference --      Peak Flow --      Pain Score 05/27/23 1757 0     Pain Loc --      Pain Education --      Exclude from Growth Chart --     Most recent vital signs: Vitals:   05/27/23 1758  BP: (!) 130/94  Pulse: (!) 58  Resp: 16  Temp: (!) 97.5 F (36.4 C)  SpO2: 97%    General: Awake, no distress.  CV:  Good peripheral perfusion.  Regular rate and rhythm  Resp:  Normal effort.  Equal breath sounds bilaterally.  Abd:  No distention.  Soft, nontender.  No rebound or guarding.  ED Results / Procedures / Treatments   EKG  EKG viewed and interpreted by myself shows a sinus rhythm at 60 bpm with a widened QRS, largely normal axis, normal intervals, nonspecific ST changes.  Occasional PVC.  MEDICATIONS ORDERED IN ED: Medications  sodium chloride 0.9 % bolus 1,000 mL (has no administration in time range)      IMPRESSION / MDM / ASSESSMENT AND PLAN / ED COURSE  I reviewed the triage vital signs and the nursing notes.  Patient's presentation is most consistent with acute presentation with potential threat to life or bodily function.  Patient presents emergency department for generalized weakness over the last 2 to 3 days.  Overall the patient appears well, no distress.  Patient's lab work has resulted overall reassuring with a normal chemistry reassuring CBC negative troponin.  Given the patient's generalized weakness and fatigue without other cause we will obtain a COVID swab as well as a urine sample to rule out urinary tract infection.  If the remainder the workup is normal with no significant findings anticipate discharge home with PCP follow-up.  FINAL CLINICAL IMPRESSION(S) / ED DIAGNOSES   Weakness  Note:  This document was prepared using Dragon voice recognition software and may include unintentional dictation errors.   Minna Antis, MD 05/27/23 2312

## 2023-05-27 NOTE — ED Notes (Signed)
No answer when called several times from lobby 

## 2023-05-27 NOTE — ED Provider Notes (Signed)
11:10 PM  Assumed care at shift change.  Initial labs unremarkable.  Awaiting troponin, COVID swab and urinalysis to evaluate for causes for fatigue/weakness.   12:16 AM  Pt's troponin is negative.  COVID-negative.  Urine shows no sign of infection or dehydration.  I feel he is safe for discharge home to follow-up with his PCP.   Wilma Michaelson, Layla Maw, DO 05/28/23 [phone]

## 2023-05-27 NOTE — ED Triage Notes (Signed)
Pt arrives via [facility] from home for worsening fatigue and generalized weakness for several days. Pt denies CP and SOB. Pt denies recent fevers, cough, N/V/D.

## 2023-05-28 LAB — URINALYSIS, ROUTINE W REFLEX MICROSCOPIC
Bacteria, UA: NONE SEEN
Bilirubin Urine: NEGATIVE
Glucose, UA: NEGATIVE mg/dL
Hgb urine dipstick: NEGATIVE
Ketones, ur: NEGATIVE mg/dL
Nitrite: NEGATIVE
Protein, ur: NEGATIVE mg/dL
Specific Gravity, Urine: 1.011 (ref 1.005–1.030)
pH: 5 (ref 5.0–8.0)

## 2023-05-28 NOTE — ED Notes (Signed)
Patient's wife has found him a ride, and they should be here in approximately 15-20 minutes

## 2023-05-29 ENCOUNTER — Telehealth: Payer: Self-pay | Admitting: Internal Medicine

## 2023-05-29 NOTE — Telephone Encounter (Signed)
Spoke with pt's wife and she stated that they have received the heart monitor and she will be placing it on by 1 pm today. She also stated that when the pt was discharged from the hospital after getting fluids his heart rate was back up in the 60s, so she stated that she is trying to push fluids with him.

## 2023-05-29 NOTE — Telephone Encounter (Signed)
Edwin Woodward from [facility], [phone]. Patient's heart rate today is 31, oxygen is 93%. No SOB. BP 140/76, heart rate would go up to 55, then later it went back down to 38. Heart rate this morning has been between 31 to 55.

## 2023-06-01 ENCOUNTER — Encounter: Payer: Self-pay | Admitting: Cardiovascular Disease

## 2023-06-01 ENCOUNTER — Telehealth: Payer: Self-pay | Admitting: Cardiovascular Disease

## 2023-06-01 NOTE — Telephone Encounter (Signed)
Spoke with [facility] reporting heart rates 38-70 at rest it is 50. No symptoms at this time. We do have an appointment tomorrow but she provided me with number and they hung up the phone on me. Will reach out to them again tomorrow.

## 2023-06-01 NOTE — Telephone Encounter (Signed)
STAT if HR is under 50 or over 120 (normal HR is 60-100 beats per minute)  What is your heart rate? High 40's-50's  Do you have a log of your heart rate readings (document readings)?   Do you have any other symptoms? Enrique Sack, physical therapist with [facility] states patient is having no symptoms.  He was sent to the ER last week for low HR in the 30's -40's was given fluid and sent home.  He is currently wearing zio monitor.  Enrique Sack states that primary care office is the one that has been called in the past to deal with the low heart rate and they ordered the zio monitor.

## 2023-06-02 NOTE — Telephone Encounter (Signed)
See My Chart message from Dr. Mariah Milling to patient.

## 2023-06-04 ENCOUNTER — Ambulatory Visit: Payer: Medicare HMO | Admitting: Nurse Practitioner

## 2023-06-10 DIAGNOSIS — Z556 Problems related to health literacy: Secondary | ICD-10-CM

## 2023-06-10 DIAGNOSIS — Z7982 Long term (current) use of aspirin: Secondary | ICD-10-CM

## 2023-06-10 DIAGNOSIS — N138 Other obstructive and reflux uropathy: Secondary | ICD-10-CM

## 2023-06-10 DIAGNOSIS — M6281 Muscle weakness (generalized): Secondary | ICD-10-CM

## 2023-06-10 DIAGNOSIS — G3109 Other frontotemporal dementia: Secondary | ICD-10-CM | POA: Diagnosis not present

## 2023-06-10 DIAGNOSIS — F411 Generalized anxiety disorder: Secondary | ICD-10-CM | POA: Diagnosis not present

## 2023-06-10 DIAGNOSIS — N401 Enlarged prostate with lower urinary tract symptoms: Secondary | ICD-10-CM

## 2023-06-10 DIAGNOSIS — I10 Essential (primary) hypertension: Secondary | ICD-10-CM

## 2023-06-10 DIAGNOSIS — Z79899 Other long term (current) drug therapy: Secondary | ICD-10-CM

## 2023-06-10 DIAGNOSIS — F0283 Dementia in other diseases classified elsewhere, unspecified severity, with mood disturbance: Secondary | ICD-10-CM | POA: Diagnosis not present

## 2023-06-10 DIAGNOSIS — H9193 Unspecified hearing loss, bilateral: Secondary | ICD-10-CM

## 2023-06-10 DIAGNOSIS — F0284 Dementia in other diseases classified elsewhere, unspecified severity, with anxiety: Secondary | ICD-10-CM | POA: Diagnosis not present

## 2023-06-11 ENCOUNTER — Telehealth: Payer: Self-pay | Admitting: Internal Medicine

## 2023-06-11 NOTE — Telephone Encounter (Signed)
I can't call the speech therapist back because the number listed is the pt's number.

## 2023-06-11 NOTE — Telephone Encounter (Signed)
Speech therapy just called and said they need verbal orders to move from 06-07-2023 to 06-14-2023. Due to patient scheduling conflict. They need approval by the provider.

## 2023-06-15 NOTE — Telephone Encounter (Signed)
I have faxed what orders we have received as of last week.

## 2023-06-15 NOTE — Telephone Encounter (Signed)
Speech therapy just called wanting to know about the verbal orders being signed by the provider. The number is [phone]. Her name is Edwin Woodward. She also said she has a few other orders that needs to be faxed from the provider.

## 2023-06-22 ENCOUNTER — Telehealth: Payer: Self-pay | Admitting: Emergency Medicine

## 2023-06-22 DIAGNOSIS — I493 Ventricular premature depolarization: Secondary | ICD-10-CM

## 2023-06-22 NOTE — Telephone Encounter (Signed)
Called and spoke with wife Edwin Woodward per [place]. Informed wife of the following from Dr. Mariah Milling.  Zio monitor results reviewed  Overall good average baseline heart rate, asymptomatic from bradycardia  Of note is the high PVC burden 28%   [name], can we call him  Would like updated echocardiogram given such high PVC burden  Thx  Tgollan   Wife verbalizes understanding. Orders placed for echo.

## 2023-06-30 ENCOUNTER — Telehealth: Payer: Self-pay | Admitting: Cardiovascular Disease

## 2023-06-30 NOTE — Telephone Encounter (Signed)
Pt needs echocardiogram scheduled.

## 2023-07-01 ENCOUNTER — Telehealth: Payer: Self-pay | Admitting: Cardiovascular Disease

## 2023-07-01 ENCOUNTER — Encounter: Payer: Self-pay | Admitting: Cardiovascular Disease

## 2023-07-01 NOTE — Telephone Encounter (Signed)
Error

## 2023-07-01 NOTE — Telephone Encounter (Signed)
The patient's wife was advised that Jillyn Hidden has been informed.

## 2023-07-01 NOTE — Telephone Encounter (Signed)
Left voicemail, pt needs echo scheduled

## 2023-07-01 NOTE — Telephone Encounter (Signed)
Patient's wife returned call to schedule the patients echo.   The test is scheduled 11/15 at [place] [facility].   Wife requested a message be sent to the tech to notify them the patient has dementia as well as other medical issues causing it to be harder for him to understand the same as most people, so you will have to be patient with him. She reports he will need assistance getting on the table and sitting up. Patient is also hard of hearing so you will have to speak loudly for him to hear you. 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 She wanted to make the tech aware as to not have to inform them of this in front of the patient.

## 2023-07-07 NOTE — Telephone Encounter (Signed)
Error

## 2023-07-15 ENCOUNTER — Ambulatory Visit (INDEPENDENT_AMBULATORY_CARE_PROVIDER_SITE_OTHER): Payer: Medicare HMO

## 2023-07-15 DIAGNOSIS — Z23 Encounter for immunization: Secondary | ICD-10-CM

## 2023-07-15 NOTE — Progress Notes (Signed)
Patient presented High Dose Flu vaccine to left deltoid, patient voiced no concerns nor showed any signs of distress during injection

## 2023-07-24 ENCOUNTER — Ambulatory Visit: Payer: Medicare HMO | Attending: Cardiovascular Disease

## 2023-07-24 DIAGNOSIS — I340 Nonrheumatic mitral (valve) insufficiency: Secondary | ICD-10-CM

## 2023-07-24 DIAGNOSIS — I493 Ventricular premature depolarization: Secondary | ICD-10-CM | POA: Diagnosis not present

## 2023-07-24 DIAGNOSIS — I5030 Unspecified diastolic (congestive) heart failure: Secondary | ICD-10-CM | POA: Diagnosis not present

## 2023-07-24 LAB — ECHOCARDIOGRAM COMPLETE
AR max vel: 2.58 cm2
AV Area VTI: 2.61 cm2
AV Area mean vel: 2.69 cm2
AV Mean grad: 3 mm[Hg]
AV Peak grad: 6.2 mm[Hg]
Ao pk vel: 1.24 m/s
Area-P 1/2: 1.89 cm2
Calc EF: 47.8 %
S' Lateral: 3.9 cm
Single Plane A2C EF: 47.8 %
Single Plane A4C EF: 47.5 %

## 2023-07-30 ENCOUNTER — Telehealth: Payer: Self-pay

## 2023-07-30 NOTE — Telephone Encounter (Signed)
[facility] orders have been placed in provider to be signed folder

## 2023-08-03 ENCOUNTER — Encounter: Payer: Self-pay | Admitting: Ophthalmology

## 2023-08-03 ENCOUNTER — Encounter: Payer: Self-pay | Admitting: Emergency Medicine

## 2023-08-05 NOTE — Discharge Instructions (Signed)

## 2023-08-11 ENCOUNTER — Ambulatory Visit: Payer: Medicare HMO | Admitting: Anesthesiology

## 2023-08-11 ENCOUNTER — Encounter
Admission: RE | Disposition: A | Discharge status: Home / Self Care | Payer: Self-pay | Source: Home / Self Care | Attending: Ophthalmology

## 2023-08-11 ENCOUNTER — Encounter: Payer: Self-pay | Admitting: Ophthalmology

## 2023-08-11 ENCOUNTER — Other Ambulatory Visit: Payer: Self-pay

## 2023-08-11 ENCOUNTER — Ambulatory Visit
Admission: RE | Admit: 2023-08-11 | Discharge: 2023-08-11 | Disposition: A | Discharge status: Home / Self Care | Payer: Medicare HMO | Attending: Ophthalmology | Admitting: Ophthalmology

## 2023-08-11 DIAGNOSIS — F411 Generalized anxiety disorder: Secondary | ICD-10-CM | POA: Diagnosis not present

## 2023-08-11 DIAGNOSIS — N183 Chronic kidney disease, stage 3 unspecified: Secondary | ICD-10-CM | POA: Diagnosis not present

## 2023-08-11 DIAGNOSIS — Z79899 Other long term (current) drug therapy: Secondary | ICD-10-CM | POA: Diagnosis not present

## 2023-08-11 DIAGNOSIS — Z7982 Long term (current) use of aspirin: Secondary | ICD-10-CM | POA: Diagnosis not present

## 2023-08-11 DIAGNOSIS — H2512 Age-related nuclear cataract, left eye: Secondary | ICD-10-CM | POA: Insufficient documentation

## 2023-08-11 DIAGNOSIS — I10 Essential (primary) hypertension: Secondary | ICD-10-CM

## 2023-08-11 DIAGNOSIS — F0390 Unspecified dementia without behavioral disturbance: Secondary | ICD-10-CM | POA: Diagnosis not present

## 2023-08-11 DIAGNOSIS — Z951 Presence of aortocoronary bypass graft: Secondary | ICD-10-CM | POA: Diagnosis not present

## 2023-08-11 DIAGNOSIS — M6281 Muscle weakness (generalized): Secondary | ICD-10-CM

## 2023-08-11 DIAGNOSIS — I129 Hypertensive chronic kidney disease with stage 1 through stage 4 chronic kidney disease, or unspecified chronic kidney disease: Secondary | ICD-10-CM | POA: Diagnosis not present

## 2023-08-11 DIAGNOSIS — Z556 Problems related to health literacy: Secondary | ICD-10-CM

## 2023-08-11 DIAGNOSIS — G3109 Other frontotemporal dementia: Secondary | ICD-10-CM | POA: Diagnosis not present

## 2023-08-11 DIAGNOSIS — F0284 Dementia in other diseases classified elsewhere, unspecified severity, with anxiety: Secondary | ICD-10-CM | POA: Diagnosis not present

## 2023-08-11 DIAGNOSIS — Z87891 Personal history of nicotine dependence: Secondary | ICD-10-CM | POA: Insufficient documentation

## 2023-08-11 DIAGNOSIS — N138 Other obstructive and reflux uropathy: Secondary | ICD-10-CM

## 2023-08-11 DIAGNOSIS — N401 Enlarged prostate with lower urinary tract symptoms: Secondary | ICD-10-CM

## 2023-08-11 DIAGNOSIS — H9193 Unspecified hearing loss, bilateral: Secondary | ICD-10-CM

## 2023-08-11 DIAGNOSIS — F0283 Dementia in other diseases classified elsewhere, unspecified severity, with mood disturbance: Secondary | ICD-10-CM | POA: Diagnosis not present

## 2023-08-11 HISTORY — PX: CATARACT EXTRACTION W/PHACO: SHX586

## 2023-08-11 HISTORY — DX: Chronic kidney disease, stage 3 unspecified: N18.30

## 2023-08-11 HISTORY — DX: Ventricular premature depolarization: I49.3

## 2023-08-11 HISTORY — DX: Unspecified dementia, unspecified severity, without behavioral disturbance, psychotic disturbance, mood disturbance, and anxiety: F03.90

## 2023-08-11 HISTORY — DX: Sleep apnea, unspecified: G47.30

## 2023-08-11 HISTORY — DX: Thalassemia minor: D56.3

## 2023-08-11 HISTORY — DX: Presence of dental prosthetic device (complete) (partial): Z97.2

## 2023-08-11 HISTORY — DX: Presence of external hearing-aid: Z97.4

## 2023-08-11 SURGERY — PHACOEMULSIFICATION, CATARACT, WITH IOL INSERTION
Anesthesia: Monitor Anesthesia Care | Site: Eye | Laterality: Left

## 2023-08-11 MED ORDER — TETRACAINE HCL 0.5 % OP SOLN
OPHTHALMIC | Status: AC
  Filled 2023-08-11: qty 4

## 2023-08-11 MED ORDER — FENTANYL CITRATE (PF) 100 MCG/2ML IJ SOLN
INTRAMUSCULAR | Status: DC | PRN
  Administered 2023-08-11: 50 ug via INTRAVENOUS

## 2023-08-11 MED ORDER — SIGHTPATH DOSE#1 BSS IO SOLN
INTRAOCULAR | Status: DC | PRN
  Administered 2023-08-11: 15 mL via INTRAOCULAR

## 2023-08-11 MED ORDER — FENTANYL CITRATE (PF) 100 MCG/2ML IJ SOLN
INTRAMUSCULAR | Status: AC
  Filled 2023-08-11: qty 2

## 2023-08-11 MED ORDER — PHENYLEPHRINE HCL 10 % OP SOLN
1.0000 [drp] | OPHTHALMIC | Status: DC | PRN
  Administered 2023-08-11: 1 [drp] via OPHTHALMIC

## 2023-08-11 MED ORDER — GLYCOPYRROLATE 0.2 MG/ML IJ SOLN
INTRAMUSCULAR | Status: AC
Start: 2023-08-11 — End: ?
  Filled 2023-08-11: qty 1

## 2023-08-11 MED ORDER — ARMC OPHTHALMIC DILATING DROPS
1.0000 | OPHTHALMIC | Status: DC | PRN
  Administered 2023-08-11 (×3): 1 via OPHTHALMIC

## 2023-08-11 MED ORDER — BRIMONIDINE TARTRATE-TIMOLOL 0.2-0.5 % OP SOLN
OPHTHALMIC | Status: DC | PRN
  Administered 2023-08-11: 1 [drp] via OPHTHALMIC

## 2023-08-11 MED ORDER — SODIUM CHLORIDE 0.9% FLUSH: 10.0000 mL | Freq: Two times a day (BID) | INTRAVENOUS | Status: DC

## 2023-08-11 MED ORDER — PHENYLEPHRINE HCL 10 % OP SOLN
OPHTHALMIC | Status: AC
  Filled 2023-08-11: qty 5

## 2023-08-11 MED ORDER — MIDAZOLAM HCL 2 MG/2ML IJ SOLN
INTRAMUSCULAR | Status: AC
  Filled 2023-08-11: qty 2

## 2023-08-11 MED ORDER — TETRACAINE HCL 0.5 % OP SOLN
1.0000 [drp] | OPHTHALMIC | Status: DC | PRN
  Administered 2023-08-11 (×3): 1 [drp] via OPHTHALMIC

## 2023-08-11 MED ORDER — EPINEPHRINE PF 1 MG/ML IJ SOLN
INTRAMUSCULAR | Status: DC | PRN
  Administered 2023-08-11: 1 mL

## 2023-08-11 MED ORDER — ONDANSETRON HCL 4 MG/2ML IJ SOLN
INTRAMUSCULAR | Status: AC
  Filled 2023-08-11: qty 2

## 2023-08-11 MED ORDER — MOXIFLOXACIN HCL 0.5 % OP SOLN
OPHTHALMIC | Status: DC | PRN
  Administered 2023-08-11: .2 mL via OPHTHALMIC

## 2023-08-11 MED ORDER — PHENYLEPHRINE-KETOROLAC 1-0.3 % IO SOLN
INTRAOCULAR | Status: DC | PRN
  Administered 2023-08-11: 64 mL via OPHTHALMIC

## 2023-08-11 MED ORDER — SIGHTPATH DOSE#1 NA CHONDROIT SULF-NA HYALURON 40-17 MG/ML IO SOLN
INTRAOCULAR | Status: DC | PRN
  Administered 2023-08-11: 1 mL via INTRAOCULAR

## 2023-08-11 MED ORDER — TRYPAN BLUE 0.06 % IO SOSY
PREFILLED_SYRINGE | INTRAOCULAR | Status: DC | PRN
  Administered 2023-08-11: .5 mL via INTRAOCULAR

## 2023-08-11 MED ORDER — SIGHTPATH DOSE#1 BSS IO SOLN
INTRAOCULAR | Status: DC | PRN
  Administered 2023-08-11: 2 mL

## 2023-08-11 SURGICAL SUPPLY — 16 items
ANGLE REVERSE CUT SHRT 25GA (CUTTER) ×1
CANNULA ANT/CHMB 27G (MISCELLANEOUS) IMPLANT
CANNULA ANT/CHMB 27GA (MISCELLANEOUS) ×1
CATARACT SUITE SIGHTPATH (MISCELLANEOUS) ×1
CYSTOTOME ANGL RVRS SHRT 25G (CUTTER) ×1 IMPLANT
FEE CATARACT SUITE SIGHTPATH (MISCELLANEOUS) ×1 IMPLANT
GLOVE BIOGEL PI IND STRL 8 (GLOVE) ×1 IMPLANT
GLOVE SURG LX STRL 8.0 MICRO (GLOVE) ×1 IMPLANT
LENS IOL TECNIS EYHANCE 21.0 (Intraocular Lens) IMPLANT
NDL FILTER BLUNT 18X1 1/2 (NEEDLE) ×1 IMPLANT
NEEDLE FILTER BLUNT 18X1 1/2 (NEEDLE) ×1
PACK VIT ANT 23G (MISCELLANEOUS) IMPLANT
RING MALYGIN (MISCELLANEOUS) IMPLANT
SUT ETHILON 10-0 CS-B-6CS-B-6 (SUTURE)
SUTURE EHLN 10-0 CS-B-6CS-B-6 (SUTURE) IMPLANT
SYR 3ML LL SCALE MARK (SYRINGE) ×1 IMPLANT

## 2023-08-11 NOTE — Transfer of Care (Signed)
Immediate Anesthesia Transfer of Care Note  Patient: Edwin Woodward  Procedure(s) Performed: CATARACT EXTRACTION PHACO AND INTRAOCULAR LENS PLACEMENT (IOC) LEFT VISION BLUE, OMIDRIA 12.06 01:13.8 (Left: Eye)  Patient Location: PACU  Anesthesia Type:MAC  Level of Consciousness: awake and alert   Airway & Oxygen Therapy: Patient Spontanous Breathing  Post-op Assessment: Report given to RN and Post -op Vital signs reviewed and stable  Post vital signs: Reviewed and stable  Last Vitals:  Vitals Value Taken Time  BP    Temp    Pulse    Resp    SpO2      Last Pain:  Vitals:   08/11/23 0955  TempSrc: Temporal  PainSc: 0-No pain         Complications: No notable events documented.

## 2023-08-11 NOTE — Op Note (Signed)
PREOPERATIVE DIAGNOSIS:  Nuclear sclerotic cataract of the left eye.   POSTOPERATIVE DIAGNOSIS:  Nuclear sclerotic cataract of the left eye.   OPERATIVE PROCEDURE:ORPROCALL@   SURGEON:  Edwin Manila, MD.   ANESTHESIA:  Anesthesiologist: Yevette Edwards, MD CRNA: Lanell Matar, CRNA  1.      Managed anesthesia care. 2.     0.72ml of Shugarcaine was instilled following the paracentesis   COMPLICATIONS: Vision Blue was used to stain the anterior capsule due to very poor/ no visualization of the red reflex.    TECHNIQUE:   Stop and chop   DESCRIPTION OF PROCEDURE:  The patient was examined and consented in the preoperative holding area where the aforementioned topical anesthesia was applied to the left eye and then brought back to the Operating Room where the left eye was prepped and draped in the usual sterile ophthalmic fashion and a lid speculum was placed. A paracentesis was created with the side port blade and the anterior chamber was filled with viscoelastic. A near clear corneal incision was performed with the steel keratome. A continuous curvilinear capsulorrhexis was performed with a cystotome followed by the capsulorrhexis forceps. Hydrodissection and hydrodelineation were carried out with BSS on a blunt cannula. The lens was removed in a stop and chop  technique and the remaining cortical material was removed with the irrigation-aspiration handpiece. The capsular bag was inflated with viscoelastic and the Technis [id number] lens was placed in the capsular bag without complication. The remaining viscoelastic was removed from the eye with the irrigation-aspiration handpiece. The wounds were hydrated. The anterior chamber was flushed with BSS and the eye was inflated to physiologic pressure. 0.44ml Vigamox was placed in the anterior chamber. The wounds were found to be water tight. The eye was dressed with Combigan. The patient was given protective glasses to wear throughout the day and a  shield with which to sleep tonight. The patient was also given drops with which to begin a drop regimen today and will follow-up with me in one day. Implant Name Type Inv. Item Serial No. Manufacturer Lot No. LRB No. Used Action  LENS IOL TECNIS EYHANCE 21.0 - [id number] Intraocular Lens LENS IOL TECNIS EYHANCE 21.0 [id number] [employer]  Left 1 Implanted    Procedure(s): CATARACT EXTRACTION PHACO AND INTRAOCULAR LENS PLACEMENT (IOC) LEFT VISION BLUE, OMIDRIA 12.06 01:13.8 (Left)  Electronically signed: Galen Woodward 08/11/2023 10:58 AM

## 2023-08-11 NOTE — Anesthesia Postprocedure Evaluation (Signed)
Anesthesia Post Note  Patient: Edwin Woodward  Procedure(s) Performed: CATARACT EXTRACTION PHACO AND INTRAOCULAR LENS PLACEMENT (IOC) LEFT VISION BLUE, OMIDRIA 12.06 01:13.8 (Left: Eye)  Patient location during evaluation: PACU Anesthesia Type: MAC Level of consciousness: awake and alert Pain management: pain level controlled Vital Signs Assessment: post-procedure vital signs reviewed and stable Respiratory status: spontaneous breathing, nonlabored ventilation, respiratory function stable and patient connected to nasal cannula oxygen Cardiovascular status: blood pressure returned to baseline and stable Postop Assessment: no apparent nausea or vomiting Anesthetic complications: no   No notable events documented.   Last Vitals:  Vitals:   08/11/23 1059 08/11/23 1104  BP: (!) 158/80 (!) 163/80  Pulse: (!) 59 (!) 57  Resp: 20 20  Temp: 36.7 C 36.7 C  SpO2: 98% 96%    Last Pain:  Vitals:   08/11/23 1104  TempSrc:   PainSc: 0-No pain                 Yevette Edwards

## 2023-08-11 NOTE — H&P (Signed)
[facility]   Primary Care Physician:  Sherlene Shams, MD Ophthalmologist: Dr.POriflio  Pre-Procedure History & Physical: HPI:  Edwin Woodward is a 72 y.o. male here for cataract surgery.   Past Medical History:  Diagnosis Date   Beta thalassemia minor    CAD (coronary artery disease)    Cardiovascular stress test abnormal    Jan 2010 no significant ischemia   CKD (chronic kidney disease), stage III (HCC)    Dementia (HCC)    08/03/23 -still able to sign own papers)   Hyperlipidemia    Hypertension    Presence of bare metal stent in right coronary artery    proximal RCA adn LAD in April 2009   PVCs (premature ventricular contractions)    Sleep apnea    Has support machine, will NOT wear   Trigger finger    Wears dentures    has partial upper and lower, does not wear   Wears hearing aid in left ear    Has, does not wear    Past Surgical History:  Procedure Laterality Date   APPENDECTOMY     APPENDECTOMY     CARPAL TUNNEL RELEASE     CATARACT EXTRACTION  2013   right   COLONOSCOPY WITH PROPOFOL N/A 06/30/2018   Procedure: COLONOSCOPY WITH PROPOFOL;  Surgeon: Wyline Mood, MD;  Location: [facility] ENDOSCOPY;  Service: Gastroenterology;  Laterality: N/A;   COLONOSCOPY WITH PROPOFOL N/A 09/14/2018   Procedure: COLONOSCOPY WITH PROPOFOL;  Surgeon: Wyline Mood, MD;  Location: [facility] ENDOSCOPY;  Service: Gastroenterology;  Laterality: N/A;   CORONARY ANGIOPLASTY  12/2007   CORONARY ARTERY BYPASS GRAFT     2 vessel   ESOPHAGOGASTRODUODENOSCOPY (EGD) WITH PROPOFOL N/A 06/29/2018   Procedure: ESOPHAGOGASTRODUODENOSCOPY (EGD) WITH PROPOFOL;  Surgeon: Wyline Mood, MD;  Location: [facility] ENDOSCOPY;  Service: Gastroenterology;  Laterality: N/A;   ESOPHAGOGASTRODUODENOSCOPY (EGD) WITH PROPOFOL N/A 09/14/2018   Procedure: ESOPHAGOGASTRODUODENOSCOPY (EGD) WITH PROPOFOL;  Surgeon: Wyline Mood, MD;  Location: [facility] ENDOSCOPY;  Service: Gastroenterology;  Laterality: N/A;   EYE SURGERY      JOINT REPLACEMENT     total knee   REPLACEMENT TOTAL KNEE     TONSILLECTOMY     VASECTOMY      Prior to Admission medications   Medication Sig Start Date End Date Taking? Authorizing Provider  alfuzosin (UROXATRAL) 10 MG 24 hr tablet TAKE 1 TABLET BY MOUTH EVERY DAY 05/20/23  Yes Sherlene Shams, MD  Ascorbic Acid (VITAMIN C) 1000 MG tablet Take 1,000 mg by mouth daily.   Yes [provider]  aspirin EC 81 MG tablet Take 81 mg by mouth daily.   Yes [provider]  atorvastatin (LIPITOR) 40 MG tablet TAKE 1 TABLET DAILY at 6 PM 05/19/23  Yes Gollan, Tollie Pizza, MD  buPROPion (WELLBUTRIN SR) 150 MG 12 hr tablet TAKE 1 TABLET BY MOUTH TWICE A DAY 08/13/15  Yes Clapacs, John T, MD  calcium carbonate 1250 MG capsule Take 1,250 mg by mouth 2 (two) times daily with a meal.   Yes [provider]  cholecalciferol (VITAMIN D3) 25 MCG (1000 UNIT) tablet Take 1,000 Units by mouth daily.   Yes [provider]  citalopram (CELEXA) 20 MG tablet Take 30 mg by mouth daily. 03/20/16  Yes [provider]  clindamycin (CLEOCIN) 300 MG capsule TAKE 2 CAPSULES BY MOUTH 1 HR BEFORE DENTAL PROCEDURE 12/31/21  Yes Sherlene Shams, MD  donepezil (ARICEPT) 10 MG tablet Take 10 mg by mouth  at bedtime.   Yes [provider]  finasteride (PROSCAR) 5 MG tablet TAKE 1 TABLET (5 MG TOTAL) BY MOUTH DAILY. 05/20/23  Yes Sherlene Shams, MD  Magnesium 250 MG TABS Take by mouth at bedtime.   Yes [provider]  memantine (NAMENDA) 5 MG tablet Take 5 mg by mouth 2 (two) times daily. 02/25/22  Yes [provider]  Multiple Vitamins-Minerals (PRESERVISION AREDS PO) Take by mouth in the morning and at bedtime.   Yes [provider]  pyridOXINE (VITAMIN B6) 100 MG tablet Take 100 mg by mouth daily with supper.   Yes [provider]  risperiDONE (RISPERDAL) 0.25 MG tablet Take 0.25 mg by mouth 2 (two) times daily.  09/14/15 08/11/23 Yes [provider]  zinc gluconate 50 MG tablet Take 50 mg by mouth daily.   Yes [provider]  Melatonin CR 3 MG TBCR Take 3 mg by mouth at bedtime.    [provider]  nitroGLYCERIN (NITROSTAT) 0.4 MG SL tablet Place 1 tablet (0.4 mg total) under the tongue every 5 (five) minutes as needed. 04/11/11   Antonieta Iba, MD  nystatin (MYCOSTATIN/NYSTOP) powder Apply 1 application topically 2 (two) times daily. 08/15/21   Sondra Come, MD  olopatadine (PATANOL) 0.1 % ophthalmic solution Place 1 drop into both eyes 2 (two) times daily. Patient taking differently: Place 1 drop into both eyes as needed. 06/22/17   Sherlene Shams, MD    Allergies as of 07/13/2023 - Review Complete 05/27/2023  Allergen Reaction Noted   Penicillins Anaphylaxis, Rash, and Other (See Comments) 08/17/2014   Clarithromycin Diarrhea, Nausea Only, and Other (See Comments) 08/17/2014   Clarithromycin  05/13/2011   Promethazine Other (See Comments) 10/05/2013   Codeine Nausea Only and Nausea And Vomiting 08/17/2014   Iodinated contrast media Rash 03/21/2013   Metrizamide Rash 03/21/2013    Family History  Problem Relation Age of Onset   Hypertension Mother    Diabetes Mother    Thalassemia Mother    Heart disease Father     Social History   Socioeconomic History   Marital status: Legally Separated    Spouse name: Not on file   Number of children: Not on file   Years of education: Not on file   Highest education level: Not on file  Occupational History   Not on file  Tobacco Use   Smoking status: Former    Current packs/day: 0.00    Average packs/day: 1 pack/day for 13.0 years (13.0 ttl pk-yrs)    Types: Cigarettes    Start date: 64    Quit date: 80    Years since quitting: 47.9   Smokeless tobacco: Never  Vaping Use   Vaping status: Never Used  Substance and Sexual Activity   Alcohol use: No    Alcohol/week: 0.0 standard drinks of alcohol   Drug use: No    Types: Marijuana     Comment: 10 years   Sexual activity: Not Currently    Birth control/protection: None  Other Topics Concern   Not on file  Social History Narrative   Lives with wife.   Social Determinants of Health   Financial Resource Strain: Not on file  Food Insecurity: Not on file  Transportation Needs: Not on file  Physical Activity: Not on file  Stress: Not on file  Social Connections: Not on file  Intimate Partner Violence: Not on file    Review of Systems: See HPI, otherwise negative  ROS  Physical Exam: BP (!) 161/76   Pulse (!) 58   Temp (!) 97.4 F (36.3 C) (Temporal)   Ht 5\' 3"  (1.6 m)   Wt 86.6 kg   SpO2 97%   BMI 33.83 kg/m  General:   Alert, cooperative in NAD Head:  Normocephalic and atraumatic. Respiratory:  Normal work of breathing. Cardiovascular:  RRR  Impression/Plan: Edwin Woodward is here for cataract surgery.  Risks, benefits, limitations, and alternatives regarding cataract surgery have been reviewed with the patient.  Questions have been answered.  All parties agreeable.   Galen Manila, MD  08/11/2023, 10:03 AM

## 2023-08-11 NOTE — Anesthesia Preprocedure Evaluation (Signed)
Anesthesia Evaluation  Patient identified by MRN, date of birth, ID band Patient awake    Reviewed: Allergy & Precautions, H&P , NPO status , Patient's Chart, lab work & pertinent test results, reviewed documented beta blocker date and time   Airway Mallampati: II  TM Distance: >3 FB Neck ROM: full    Dental no notable dental hx. (+) Teeth Intact   Pulmonary sleep apnea , former smoker   Pulmonary exam normal breath sounds clear to auscultation       Cardiovascular Exercise Tolerance: Poor hypertension, On Medications + CAD   Rhythm:regular Rate:Normal     Neuro/Psych  PSYCHIATRIC DISORDERS Anxiety Depression     Neuromuscular disease    GI/Hepatic Neg liver ROS,GERD  Medicated,,  Endo/Other  negative endocrine ROSdiabetes    Renal/GU Renal disease     Musculoskeletal   Abdominal   Peds  Hematology  (+) Blood dyscrasia, anemia   Anesthesia Other Findings   Reproductive/Obstetrics negative OB ROS                             Anesthesia Physical Anesthesia Plan  ASA: 3  Anesthesia Plan: MAC   Post-op Pain Management:    Induction:   PONV Risk Score and Plan: 1  Airway Management Planned:   Additional Equipment:   Intra-op Plan:   Post-operative Plan:   Informed Consent: I have reviewed the patients History and Physical, chart, labs and discussed the procedure including the risks, benefits and alternatives for the proposed anesthesia with the patient or authorized representative who has indicated his/her understanding and acceptance.       Plan Discussed with: CRNA  Anesthesia Plan Comments:        Anesthesia Quick Evaluation

## 2023-08-17 ENCOUNTER — Encounter: Payer: Self-pay | Admitting: Ophthalmology

## 2023-08-22 ENCOUNTER — Other Ambulatory Visit: Payer: Self-pay | Admitting: Internal Medicine

## 2023-08-22 DIAGNOSIS — N401 Enlarged prostate with lower urinary tract symptoms: Secondary | ICD-10-CM

## 2023-09-24 ENCOUNTER — Telehealth: Payer: Self-pay

## 2023-09-24 NOTE — Telephone Encounter (Signed)
Copied from CRM [id number]. Topic: Clinical - Home Health Verbal Orders >> Sep 24, 2023 10:16 AM Fredrich Romans wrote: Caller/Agency: Modesta Messing [facility] Callback Number: [phone] Service Requested: Speech Therapy Frequency: 1wk 2 Cognitive communication deficients  Any new concerns about the patient? No

## 2023-09-25 NOTE — Telephone Encounter (Signed)
 Lvm for callback

## 2023-09-28 ENCOUNTER — Ambulatory Visit: Payer: Self-pay | Admitting: Internal Medicine

## 2023-09-28 ENCOUNTER — Telehealth: Payer: Self-pay | Admitting: Cardiovascular Disease

## 2023-09-28 NOTE — Telephone Encounter (Signed)
Pt refused ED. Pt is scheduled to see Dr. Darrick Huntsman this Friday.

## 2023-09-28 NOTE — Telephone Encounter (Addendum)
Chief Complaint: Bradycardia Symptoms: Low HR Pertinent Negatives: Patient denies dizziness, lightheadedness, chest pain, sweating Disposition: [x] ED /[] Urgent Care (no appt availability in office) / [] Appointment(In office/virtual)/ []  [facility]/ [] Home Care/ [x] Refused Recommended Disposition /[] [facility]/ []  Follow-up with PCP Additional Notes: Patient's home health physical therapist called in with patient to report a low HR of HR in the 30's. Patient states he is asymptomatic, denying chest pain, sweating, lightheadedness, dizziness. PT also stating as she is watching pulse ox, it is jumping from 30's, to 40's, to 60's, back to 30's. Patient denies any previous diagnosis of A-Fib. Patient's PT states this has happened once before and he went to ER for IV fluids. While on the phone, PT read incoming message from Cardiologist that patient received stating the low HR is a false reading due to pulse ox unable to detect PVCs, and that patient's HR is not a cause of concern at this time. PT states she is manually feeling wrist and counting HR in the 30's as well. Confirmed the patient's BP at this time is 144/76 and O2 of 96%. Advised patient to be seen in ED for evaluation. PT states patient and wife are not wanting to go to ER at this time due to patient feeling fine. Advised patient to follow up immediately with phone call to Cardiologist to inform of symptoms. Patient verbalized understanding.   Copied from CRM [id number]. Topic: Clinical - Red Word Triage >> Sep 28, 2023 10:52 AM Shelbie Proctor wrote: Red Word that prompted transfer to Nurse Triage: Welton Flakes physcial therapist [phone] from [facility] health since July, low heart 30 and walking around 60-62 and is concerned. Patient has shortness of breath when walking. Patient is not feeling dizziness, nor lightheaded. Reason for Disposition  Patient sounds very sick or weak to the triager    Evaluation of low HR and  dehydration status  Answer Assessment - Initial Assessment Questions 1. DESCRIPTION: "Please describe your heart rate or heartbeat that you are having" (e.g., fast/slow, regular/irregular, skipped or extra beats, "palpitations")     PT called in about low HR 2. ONSET: "When did it start?" (Minutes, hours or days)      Today - but has happened before in September 3. DURATION: "How long does it last" (e.g., seconds, minutes, hours)     Intermittent and increases with exercises 6. HEART RATE: "Can you tell me your heart rate?" "How many beats in 15 seconds?"  (Note: not all patients can do this)       Low HR capturing 30 on pulse ox 7. RECURRENT SYMPTOM: "Have you ever had this before?" If Yes, ask: "When was the last time?" and "What happened that time?"      Once in September 8. CAUSE: "What do you think is causing the palpitations?"     PVC 9. CARDIAC HISTORY: "Do you have any history of heart disease?" (e.g., heart attack, angina, bypass surgery, angioplasty, arrhythmia)      YEs 10. OTHER SYMPTOMS: "Do you have any other symptoms?" (e.g., dizziness, chest pain, sweating, difficulty breathing)       Feeling normal  Protocols used: Heart Rate and Heartbeat Questions-A-AH

## 2023-09-28 NOTE — Telephone Encounter (Signed)
STAT if HR is under 50 or over 120 (normal HR is 60-100 beats per minute)  What is your heart rate? Has been in the 30s  Do you have a log of your heart rate readings (document readings)?    Do you have any other symptoms? no

## 2023-09-28 NOTE — Telephone Encounter (Signed)
Called and spoke with wife Edwin Woodward per [place]. Informed her of the following from Dr. Mariah Milling.  He has long history of PVCs  PVCs will not be measured on routine pulse check including blood pressure cuff  It will read slow  We are aware of PVCs, he is asymptomatic with normal ejection fraction on echo  No further testing needed  It is safe for PT to work with him  Thx  TGollan   Wife verbalizes understanding. Will send MyChart message per wife request.

## 2023-09-28 NOTE — Telephone Encounter (Signed)
Call transferred directly to this RN from the Call Center.   I spoke with the patient's PT, Ana.  She reports seeing the patient in the home today and his HR is 30 by automatic and manuel check.  The patient has not had much to drink and is currently working on his 2nd bottle of water.  BP 144/76.  Ana confirmed with the patient he does not check his BP/ HR at home.   Reviewed the patient's chart. He has had a high PVC burden (28%) per his last monitor report dated 05/26/23. He is not currently on any heart rate suppressing medications.   Darien Ramus confirms the patient is currently asymptomatic.  I advised Ana that the patient is most likely having PVC's. She is aware I will forward this message to Dr. Mariah Milling and we will call back with further recommendations.  Per Darien Ramus, we should call the patient's wife Carley Hammed- primary # listed in the patient's chart belongs to [place].

## 2023-10-02 ENCOUNTER — Encounter: Payer: Self-pay | Admitting: Internal Medicine

## 2023-10-02 ENCOUNTER — Ambulatory Visit (INDEPENDENT_AMBULATORY_CARE_PROVIDER_SITE_OTHER): Payer: Medicare HMO | Admitting: Internal Medicine

## 2023-10-02 VITALS — BP 146/74 | HR 52 | Ht 63.0 in | Wt 195.8 lb

## 2023-10-02 DIAGNOSIS — N401 Enlarged prostate with lower urinary tract symptoms: Secondary | ICD-10-CM | POA: Diagnosis not present

## 2023-10-02 DIAGNOSIS — G3109 Other frontotemporal dementia: Secondary | ICD-10-CM

## 2023-10-02 DIAGNOSIS — E66811 Obesity, class 1: Secondary | ICD-10-CM

## 2023-10-02 DIAGNOSIS — G252 Other specified forms of tremor: Secondary | ICD-10-CM

## 2023-10-02 DIAGNOSIS — F322 Major depressive disorder, single episode, severe without psychotic features: Secondary | ICD-10-CM

## 2023-10-02 DIAGNOSIS — F0280 Dementia in other diseases classified elsewhere without behavioral disturbance: Secondary | ICD-10-CM

## 2023-10-02 DIAGNOSIS — I10 Essential (primary) hypertension: Secondary | ICD-10-CM | POA: Diagnosis not present

## 2023-10-02 DIAGNOSIS — E6609 Other obesity due to excess calories: Secondary | ICD-10-CM

## 2023-10-02 DIAGNOSIS — E782 Mixed hyperlipidemia: Secondary | ICD-10-CM

## 2023-10-02 DIAGNOSIS — Z6831 Body mass index (BMI) 31.0-31.9, adult: Secondary | ICD-10-CM

## 2023-10-02 DIAGNOSIS — N138 Other obstructive and reflux uropathy: Secondary | ICD-10-CM

## 2023-10-02 DIAGNOSIS — G1220 Motor neuron disease, unspecified: Secondary | ICD-10-CM

## 2023-10-02 DIAGNOSIS — D569 Thalassemia, unspecified: Secondary | ICD-10-CM

## 2023-10-02 MED ORDER — ALPRAZOLAM 0.25 MG PO TABS: 0.2500 mg | ORAL_TABLET | Freq: Every evening | ORAL | 0 refills | Status: DC | PRN

## 2023-10-02 MED ORDER — FINASTERIDE 5 MG PO TABS: 5.0000 mg | ORAL_TABLET | Freq: Every day | ORAL | 1 refills | Status: DC

## 2023-10-02 MED ORDER — ALFUZOSIN HCL ER 10 MG PO TB24: 10.0000 mg | ORAL_TABLET | Freq: Every day | ORAL | 1 refills | Status: DC

## 2023-10-02 NOTE — Progress Notes (Unsigned)
Subjective:  Patient ID: Edwin Woodward, male    DOB: 09-10-1950  Age: 73 y.o. MRN: [id number]  CC: The primary encounter diagnosis was HYPERTENSION, BENIGN. Diagnoses of BPH with obstruction/lower urinary tract symptoms, Mixed hyperlipidemia, and Class 1 obesity due to excess calories with serious comorbidity and body mass index (BMI) of 31.0 to 31.9 in adult were also pertinent to this visit.   HPI AIDENN SKELLENGER presents for  Chief Complaint  Patient presents with   Medical Management of Chronic Issues    6 month follow up     1) arrhythmia  : frequent pvc's causing low pulse reading on pulse ox,  Dr Mariah Milling rec no further workup since he is asymptomatic  2) BPHwith LUTS/nocturia :  taking alfuzosin and finasteride  3)  Tremors:  wife has stopped the evening dose of risperdal and tremors have improved .  Has occasional agitation expressed with verbal communication     Outpatient Medications Prior to Visit  Medication Sig Dispense Refill   alfuzosin (UROXATRAL) 10 MG 24 hr tablet TAKE 1 TABLET BY MOUTH EVERY DAY 90 tablet 0   Ascorbic Acid (VITAMIN C) 1000 MG tablet Take 1,000 mg by mouth daily.     aspirin EC 81 MG tablet Take 81 mg by mouth daily.     atorvastatin (LIPITOR) 40 MG tablet TAKE 1 TABLET DAILY at 6 PM 90 tablet 3   buPROPion (WELLBUTRIN SR) 150 MG 12 hr tablet TAKE 1 TABLET BY MOUTH TWICE A DAY 60 tablet 6   calcium carbonate 1250 MG capsule Take 1,250 mg by mouth 2 (two) times daily with a meal.     cholecalciferol (VITAMIN D3) 25 MCG (1000 UNIT) tablet Take 1,000 Units by mouth daily.     citalopram (CELEXA) 20 MG tablet Take 30 mg by mouth daily.     clindamycin (CLEOCIN) 300 MG capsule TAKE 2 CAPSULES BY MOUTH 1 HR BEFORE DENTAL PROCEDURE 2 capsule 1   donepezil (ARICEPT) 10 MG tablet Take 10 mg by mouth at bedtime.     finasteride (PROSCAR) 5 MG tablet TAKE 1 TABLET (5 MG TOTAL) BY MOUTH DAILY. 90 tablet 0   Lactobacillus (ACIDOPHILUS PO) Take 1  capsule by mouth daily.     Magnesium 250 MG TABS Take by mouth at bedtime.     Melatonin CR 3 MG TBCR Take 3 mg by mouth at bedtime.     memantine (NAMENDA) 5 MG tablet Take 5 mg by mouth 2 (two) times daily.     Multiple Vitamins-Minerals (PRESERVISION AREDS PO) Take by mouth in the morning and at bedtime.     nitroGLYCERIN (NITROSTAT) 0.4 MG SL tablet Place 1 tablet (0.4 mg total) under the tongue every 5 (five) minutes as needed. 90 tablet 3   nystatin (MYCOSTATIN/NYSTOP) powder Apply 1 application topically 2 (two) times daily. 60 g 0   pyridOXINE (VITAMIN B6) 100 MG tablet Take 100 mg by mouth daily with supper.     risperiDONE (RISPERDAL) 0.25 MG tablet Take 0.25 mg by mouth daily.     zinc gluconate 50 MG tablet Take 50 mg by mouth daily.     olopatadine (PATANOL) 0.1 % ophthalmic solution Place 1 drop into both eyes 2 (two) times daily. (Patient taking differently: Place 1 drop into both eyes as needed.) 5 mL 12   No facility-administered medications prior to visit.    Review of Systems;  Patient denies headache, fevers, malaise, unintentional weight loss, skin rash, eye pain,  sinus congestion and sinus pain, sore throat, dysphagia,  hemoptysis , cough, dyspnea, wheezing, chest pain, palpitations, orthopnea, edema, abdominal pain, nausea, melena, diarrhea, constipation, flank pain, dysuria, hematuria, urinary  Frequency, nocturia, numbness, tingling, seizures,  Focal weakness, Loss of consciousness,  Tremor, insomnia, depression, anxiety, and suicidal ideation.      Objective:  BP (!) 146/74   Pulse (!) 52   Ht 5\' 3"  (1.6 m)   Wt 195 lb 12.8 oz (88.8 kg)   SpO2 97%   BMI 34.68 kg/m   BP Readings from Last 3 Encounters:  10/02/23 (!) 146/74  08/11/23 (!) 163/80  05/28/23 (!) 132/91    Wt Readings from Last 3 Encounters:  10/02/23 195 lb 12.8 oz (88.8 kg)  08/11/23 191 lb (86.6 kg)  05/27/23 193 lb (87.5 kg)    Physical Exam  Lab Results  Component Value Date    HGBA1C 5.8 10/29/2022    Lab Results  Component Value Date   CREATININE 1.07 05/27/2023   CREATININE 1.25 03/31/2023   CREATININE 1.23 10/29/2022    Lab Results  Component Value Date   WBC 8.0 05/27/2023   HGB 11.9 (L) 05/27/2023   HCT 38.6 (L) 05/27/2023   PLT 205 05/27/2023   GLUCOSE 84 05/27/2023   CHOL 128 10/29/2022   TRIG 103.0 10/29/2022   HDL 40.30 10/29/2022   LDLDIRECT 71.0 10/29/2022   LDLCALC 67 10/29/2022   ALT 33 03/31/2023   AST 26 03/31/2023   NA 137 05/27/2023   K 3.9 05/27/2023   CL 108 05/27/2023   CREATININE 1.07 05/27/2023   BUN 22 05/27/2023   CO2 22 05/27/2023   TSH 2.44 03/31/2023   PSA 0.30 10/29/2022   INR 1.16 06/26/2018   HGBA1C 5.8 10/29/2022   MICROALBUR 1.1 10/29/2022    No results found.  Assessment & Plan:  .HYPERTENSION, BENIGN  BPH with obstruction/lower urinary tract symptoms  Mixed hyperlipidemia  Class 1 obesity due to excess calories with serious comorbidity and body mass index (BMI) of 31.0 to 31.9 in adult     I provided 30 minutes of face-to-face time during this encounter reviewing patient's last visit with me, patient's  most recent visit with cardiology,  nephrology,  and neurology,  recent surgical and non surgical procedures, previous  labs and imaging studies, counseling on currently addressed issues,  and post visit ordering to diagnostics and therapeutics .   Follow-up: No follow-ups on file.   Sherlene Shams, MD

## 2023-10-02 NOTE — Assessment & Plan Note (Addendum)
,   mood is well Controlled with  wellbutrin/citalopram,  and agitation with once daily risperda  dosing   managed by neurology   No changes today.    dose of risperdal was reduced to once daily due to increased tremor.  History of excessive sedation with prior trial of seroquel

## 2023-10-02 NOTE — Patient Instructions (Addendum)
Edwin Woodward.  Brush your teeth 3 times daily to keep teeth clean and breath tolerable (especially from garlic)   Home health referral has been re instated for health aide  Ok to use low dose Alprazolam as needed for agitation I nthe evening   No more COVID VACCINES FOR Edwin Woodward  Colonoscopy postponed for now    I do recommend the RSV vaccine for Edwin Woodward  It is now available at his pharmacy  , along with the TDaP vaccine which is really overdue

## 2023-10-03 ENCOUNTER — Encounter: Payer: Self-pay | Admitting: Internal Medicine

## 2023-10-03 LAB — COMPREHENSIVE METABOLIC PANEL
AG Ratio: 1.6 (calc) (ref 1.0–2.5)
ALT: 28 U/L (ref 9–46)
AST: 22 U/L (ref 10–35)
Albumin: 4.2 g/dL (ref 3.6–5.1)
Alkaline phosphatase (APISO): 82 U/L (ref 35–144)
BUN: 21 mg/dL (ref 7–25)
CO2: 26 mmol/L (ref 20–32)
Calcium: 9.1 mg/dL (ref 8.6–10.3)
Chloride: 102 mmol/L (ref 98–110)
Creat: 1.09 mg/dL (ref 0.70–1.28)
Globulin: 2.7 g/dL (ref 1.9–3.7)
Glucose, Bld: 73 mg/dL (ref 65–99)
Potassium: 4.4 mmol/L (ref 3.5–5.3)
Sodium: 138 mmol/L (ref 135–146)
Total Bilirubin: 0.6 mg/dL (ref 0.2–1.2)
Total Protein: 6.9 g/dL (ref 6.1–8.1)

## 2023-10-03 LAB — CBC WITH DIFFERENTIAL/PLATELET
Absolute Lymphocytes: 2521 {cells}/uL (ref 850–3900)
Absolute Monocytes: 937 {cells}/uL (ref 200–950)
Basophils Absolute: 36 {cells}/uL (ref 0–200)
Basophils Relative: 0.4 %
Eosinophils Absolute: 127 {cells}/uL (ref 15–500)
Eosinophils Relative: 1.4 %
HCT: 39.7 % (ref 38.5–50.0)
Hemoglobin: 12.7 g/dL — ABNORMAL LOW (ref 13.2–17.1)
MCH: 21.1 pg — ABNORMAL LOW (ref 27.0–33.0)
MCHC: 32 g/dL (ref 32.0–36.0)
MCV: 65.9 fL — ABNORMAL LOW (ref 80.0–100.0)
MPV: 11.2 fL (ref 7.5–12.5)
Monocytes Relative: 10.3 %
Neutro Abs: 5478 {cells}/uL (ref 1500–7800)
Neutrophils Relative %: 60.2 %
Platelets: 274 10*3/uL (ref 140–400)
RBC: 6.02 10*6/uL — ABNORMAL HIGH (ref 4.20–5.80)
RDW: 15.6 % — ABNORMAL HIGH (ref 11.0–15.0)
Total Lymphocyte: 27.7 %
WBC: 9.1 10*3/uL (ref 3.8–10.8)

## 2023-10-03 LAB — TSH: TSH: 2.85 m[IU]/L (ref 0.40–4.50)

## 2023-10-03 LAB — HEMOGLOBIN A1C
Hgb A1c MFr Bld: 5.4 %{Hb} (ref <5.7)
Mean Plasma Glucose: 108 mg/dL
eAG (mmol/L): 6 mmol/L

## 2023-10-03 LAB — CBC MORPHOLOGY

## 2023-10-03 LAB — LIPID PANEL
Cholesterol: 118 mg/dL (ref <200)
HDL: 38 mg/dL — ABNORMAL LOW (ref >=40)
LDL Cholesterol (Calc): 60 mg/dL
Non-HDL Cholesterol (Calc): 80 mg/dL (ref <130)
Total CHOL/HDL Ratio: 3.1 (calc) (ref <5.0)
Triglycerides: 114 mg/dL (ref <150)

## 2023-10-03 LAB — LDL CHOLESTEROL, DIRECT: Direct LDL: 68 mg/dL (ref <100)

## 2023-10-03 LAB — MICROALBUMIN / CREATININE URINE RATIO
Creatinine, Urine: 148 mg/dL (ref 20–320)
Microalb Creat Ratio: 4 mg/g{creat} (ref <30)
Microalb, Ur: 0.6 mg/dL

## 2023-10-04 NOTE — Assessment & Plan Note (Signed)
Currenlty taking aricept and memantine. Managed by [facility].  He requires supervision of medications which is done by his family (ex wife)  and is need of a home health aide to assist him in managing hygiene,.  Home health referral tor aide has been ordered.

## 2023-10-04 NOTE — Assessment & Plan Note (Signed)
Encouraged Edwin Woodward to start walking  for exercise  with a goal of 30 minutes of aerobic exercise a minimum of 5 days per week. Screening for lipid disorders, thyroid and diabetes  has been done  Lab Results  Component Value Date   HGBA1C 5.4 10/02/2023   Lab Results  Component Value Date   TSH 2.85 10/02/2023

## 2023-10-04 NOTE — Assessment & Plan Note (Signed)
Resolved,  With a fine resting tremor still noted on exam, which was aggravated by twice daily use of risperdal.  Tremor has improved with reduction in dosing of antipsychotic to once daily

## 2023-10-06 DIAGNOSIS — H9193 Unspecified hearing loss, bilateral: Secondary | ICD-10-CM

## 2023-10-06 DIAGNOSIS — M6281 Muscle weakness (generalized): Secondary | ICD-10-CM

## 2023-10-06 DIAGNOSIS — F0284 Dementia in other diseases classified elsewhere, unspecified severity, with anxiety: Secondary | ICD-10-CM | POA: Diagnosis not present

## 2023-10-06 DIAGNOSIS — Z7982 Long term (current) use of aspirin: Secondary | ICD-10-CM

## 2023-10-06 DIAGNOSIS — I10 Essential (primary) hypertension: Secondary | ICD-10-CM

## 2023-10-06 DIAGNOSIS — F411 Generalized anxiety disorder: Secondary | ICD-10-CM | POA: Diagnosis not present

## 2023-10-06 DIAGNOSIS — Z79899 Other long term (current) drug therapy: Secondary | ICD-10-CM

## 2023-10-06 DIAGNOSIS — G3109 Other frontotemporal dementia: Secondary | ICD-10-CM | POA: Diagnosis not present

## 2023-10-06 DIAGNOSIS — N401 Enlarged prostate with lower urinary tract symptoms: Secondary | ICD-10-CM

## 2023-10-06 DIAGNOSIS — F0283 Dementia in other diseases classified elsewhere, unspecified severity, with mood disturbance: Secondary | ICD-10-CM | POA: Diagnosis not present

## 2023-10-06 DIAGNOSIS — N138 Other obstructive and reflux uropathy: Secondary | ICD-10-CM

## 2023-10-06 DIAGNOSIS — R2689 Other abnormalities of gait and mobility: Secondary | ICD-10-CM

## 2023-10-07 ENCOUNTER — Telehealth: Payer: Self-pay

## 2023-10-07 NOTE — Telephone Encounter (Signed)
FYI

## 2023-10-07 NOTE — Telephone Encounter (Signed)
Copied from CRM [id number]. Topic: General - Other >> Oct 07, 2023  8:13 AM Elizebeth Brooking wrote: Reason for CRM: Edwin Woodward from Home health calling to report that patient had a fall yesterday while walking no injuries or skin abrasion wanted to report doctor for protocol for a fall.

## 2023-10-26 ENCOUNTER — Telehealth: Payer: Self-pay

## 2023-10-26 NOTE — Telephone Encounter (Signed)
Verbal orders were given 

## 2023-10-26 NOTE — Telephone Encounter (Signed)
Copied from CRM [id number]. Topic: Clinical - Home Health Verbal Orders >> Oct 26, 2023 12:37 PM Irine Seal wrote: Caller/Agency: Rico Ala PT- [facility]  Callback Number: [phone] Service Requested: Physical Therapy Frequency: 1 week, 4  Any new concerns about the patient? Wants to continue to work on his balance and prevent falls

## 2024-01-06 ENCOUNTER — Encounter: Payer: Self-pay | Admitting: Internal Medicine

## 2024-01-06 DIAGNOSIS — M6281 Muscle weakness (generalized): Secondary | ICD-10-CM

## 2024-01-06 DIAGNOSIS — R296 Repeated falls: Secondary | ICD-10-CM

## 2024-01-12 NOTE — Addendum Note (Signed)
 Addended by: Thersia Flax on: 01/12/2024 01:07 PM   Modules accepted: Orders

## 2024-04-05 ENCOUNTER — Ambulatory Visit: Payer: Medicare HMO | Admitting: Internal Medicine

## 2024-04-05 ENCOUNTER — Encounter: Payer: Self-pay | Admitting: Internal Medicine

## 2024-04-05 VITALS — BP 118/78 | HR 31 | Temp 98.3°F | Ht 63.0 in | Wt 189.6 lb

## 2024-04-05 DIAGNOSIS — G1220 Motor neuron disease, unspecified: Secondary | ICD-10-CM

## 2024-04-05 DIAGNOSIS — G3109 Other frontotemporal dementia: Secondary | ICD-10-CM | POA: Diagnosis not present

## 2024-04-05 DIAGNOSIS — I10 Essential (primary) hypertension: Secondary | ICD-10-CM | POA: Diagnosis not present

## 2024-04-05 DIAGNOSIS — R296 Repeated falls: Secondary | ICD-10-CM | POA: Diagnosis not present

## 2024-04-05 DIAGNOSIS — M6281 Muscle weakness (generalized): Secondary | ICD-10-CM | POA: Diagnosis not present

## 2024-04-05 DIAGNOSIS — G252 Other specified forms of tremor: Secondary | ICD-10-CM

## 2024-04-05 DIAGNOSIS — F0280 Dementia in other diseases classified elsewhere without behavioral disturbance: Secondary | ICD-10-CM

## 2024-04-05 DIAGNOSIS — N401 Enlarged prostate with lower urinary tract symptoms: Secondary | ICD-10-CM

## 2024-04-05 DIAGNOSIS — F988 Other specified behavioral and emotional disorders with onset usually occurring in childhood and adolescence: Secondary | ICD-10-CM | POA: Insufficient documentation

## 2024-04-05 DIAGNOSIS — N138 Other obstructive and reflux uropathy: Secondary | ICD-10-CM

## 2024-04-05 LAB — COMPREHENSIVE METABOLIC PANEL WITH GFR
ALT: 24 U/L (ref 0–53)
AST: 22 U/L (ref 0–37)
Albumin: 4.2 g/dL (ref 3.5–5.2)
Alkaline Phosphatase: 72 U/L (ref 39–117)
BUN: 17 mg/dL (ref 6–23)
CO2: 26 meq/L (ref 19–32)
Calcium: 9.4 mg/dL (ref 8.4–10.5)
Chloride: 106 meq/L (ref 96–112)
Creatinine, Ser: 1.13 mg/dL (ref 0.40–1.50)
GFR: 64.32 mL/min (ref >60.00)
Glucose, Bld: 96 mg/dL (ref 70–99)
Potassium: 4.4 meq/L (ref 3.5–5.1)
Sodium: 140 meq/L (ref 135–145)
Total Bilirubin: 0.6 mg/dL (ref 0.2–1.2)
Total Protein: 6.7 g/dL (ref 6.0–8.3)

## 2024-04-05 MED ORDER — ALFUZOSIN HCL ER 10 MG PO TB24: 10.0000 mg | ORAL_TABLET | Freq: Every day | ORAL | 1 refills | Status: AC

## 2024-04-05 MED ORDER — FINASTERIDE 5 MG PO TABS: 5.0000 mg | ORAL_TABLET | Freq: Every day | ORAL | 1 refills | Status: AC

## 2024-04-05 NOTE — Assessment & Plan Note (Signed)
 Well controlled on current regimen. Renal function is due for assessment. , no changes today.  Lab Results  Component Value Date   CREATININE 1.09 10/02/2023   Lab Results  Component Value Date   NA 138 10/02/2023   K 4.4 10/02/2023   CL 102 10/02/2023   CO2 26 10/02/2023

## 2024-04-05 NOTE — Progress Notes (Unsigned)
 Subjective:  Patient ID: Edwin Woodward, male    DOB: [birth year]  Age: 73 y.o. MRN: [id number]  CC: The primary encounter diagnosis was Muscle weakness of lower extremity. 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   HPI Edwin Woodward presents for  Chief Complaint  Patient presents with   Medical Management of Chronic Issues    6 month F/U   1) FTD with MND:  referral for home health aide made at last visit in January .  Aide came for 3 months and provided full PT   jan/Feb/March.  Since they stopped he has not been exercising and had another fall.  The private pay aide his wife paid for for the month of May did not spend any time with Edwin Woodward.  She was fired.  His fall was reviewed today:  occurred as he was leaving the house which requires  one step down.  He was unable to get up unassisted ,  required 2 neighbors to gt him up. Meds: namenda, aricept  and risperidal dose reduce to 0.125 at bedtime.  Tremors have resolved and confusion is at baseline .  Planning to stop risperidone  completely as he has not had any agitation    2) BPH with LUTS using finasteride  and alfuzosin  ,  denies trouble voiding currently. . DUE FOR UROLOGY FOLLOW UP. Refills needed   3) [name] is taking his medications as prescribed and notes no adverse effects.  Home BP readings have been done about once per week and are  generally < 130/80 .  he is avoiding added salt in her diet and not walking regularly or doing any form of exercise.   NEEDS REPEAT HOME HEALTH REFERRAL FOR PT  [facility] PREFERRED DUE TO PAST EXPERIENCE  .  ALSO NEEDS AN AIDE FOR HYGIENE, MEAL PREP AND LAUNDRY   Outpatient Medications Prior to Visit  Medication Sig Dispense Refill   ALPRAZolam  (XANAX ) 0.25 MG tablet Take 1  tablet (0.25 mg total) by mouth at bedtime as needed for anxiety (or agitation). 30 tablet 0   Ascorbic Acid (VITAMIN C) 1000 MG tablet Take 1,000 mg by mouth daily.     aspirin  EC 81 MG tablet Take 81 mg by mouth daily.     atorvastatin  (LIPITOR) 40 MG tablet TAKE 1 TABLET DAILY at 6 PM 90 tablet 3   buPROPion  (WELLBUTRIN  SR) 150 MG 12 hr tablet TAKE 1 TABLET BY MOUTH TWICE A DAY 60 tablet 6   calcium  carbonate 1250 MG capsule Take 1,250 mg by mouth 2 (two) times daily with a meal.     cholecalciferol (VITAMIN D3) 25 MCG (1000 UNIT) tablet Take 1,000 Units by mouth daily.     citalopram  (CELEXA ) 20 MG tablet Take 30 mg by mouth daily.     clindamycin  (CLEOCIN ) 300 MG capsule TAKE 2 CAPSULES BY MOUTH 1 HR BEFORE DENTAL PROCEDURE 2 capsule 1   cyanocobalamin (VITAMIN B12) 1000 MCG tablet Take 1,000 mcg by mouth daily.     donepezil  (ARICEPT ) 10 MG tablet Take 10 mg by mouth at bedtime.     Lactobacillus (ACIDOPHILUS PO) Take 1 capsule by mouth daily.     Magnesium  250 MG TABS Take by mouth at bedtime.     Melatonin CR 3 MG TBCR Take 3 mg by mouth at bedtime.  memantine (NAMENDA) 5 MG tablet Take 5 mg by mouth 2 (two) times daily.     Multiple Vitamins-Minerals (PRESERVISION AREDS PO) Take by mouth in the morning and at bedtime.     nitroGLYCERIN  (NITROSTAT ) 0.4 MG SL tablet Place 1 tablet (0.4 mg total) under the tongue every 5 (five) minutes as needed. 90 tablet 3   nystatin  (MYCOSTATIN /NYSTOP ) powder Apply 1 application topically 2 (two) times daily. 60 g 0   pyridOXINE (VITAMIN B6) 100 MG tablet Take 100 mg by mouth daily with supper.     risperiDONE  (RISPERDAL ) 0.25 MG tablet Take 0.25 mg by mouth daily. (Patient taking differently: Take 0.5 tablets by mouth daily.)     zinc gluconate 50 MG tablet Take 50 mg by mouth daily.     alfuzosin  (UROXATRAL ) 10 MG 24 hr tablet Take 1 tablet (10 mg total) by mouth daily. 90 tablet 1   finasteride  (PROSCAR ) 5 MG tablet Take 1 tablet (5 mg total) by  mouth daily. 90 tablet 1   No facility-administered medications prior to visit.    Review of Systems;  Patient denies headache, fevers, malaise, unintentional weight loss, skin rash, eye pain, sinus congestion and sinus pain, sore throat, dysphagia,  hemoptysis , cough, dyspnea, wheezing, chest pain, palpitations, orthopnea, edema, abdominal pain, nausea, melena, diarrhea, constipation, flank pain, dysuria, hematuria, urinary  Frequency, nocturia, numbness, tingling, seizures,  Focal weakness, Loss of consciousness,  Tremor, insomnia, depression, anxiety, and suicidal ideation.      Objective:  BP 118/78 (BP Location: Left Arm, Patient Position: Sitting, Cuff Size: Normal)   Pulse (!) 31   Temp 98.3 F (36.8 C) (Oral)   Ht 5' 3 (1.6 m)   Wt 189 lb 9.6 oz (86 kg)   SpO2 95%   BMI 33.59 kg/m   BP Readings from Last 3 Encounters:  04/05/24 118/78  10/02/23 (!) 146/74  08/11/23 (!) 163/80    Wt Readings from Last 3 Encounters:  04/05/24 189 lb 9.6 oz (86 kg)  10/02/23 195 lb 12.8 oz (88.8 kg)  08/11/23 191 lb (86.6 kg)    Physical Exam Vitals reviewed.  Constitutional:      General: He is not in acute distress.    Appearance: Normal appearance. He is obese. He is not ill-appearing, toxic-appearing or diaphoretic.  HENT:     Head: Normocephalic.  Eyes:     General: No scleral icterus.       Right eye: No discharge.        Left eye: No discharge.     Conjunctiva/sclera: Conjunctivae normal.  Cardiovascular:     Rate and Rhythm: Normal rate and regular rhythm.     Heart sounds: Normal heart sounds.  Pulmonary:     Effort: Pulmonary effort is normal. No respiratory distress.     Breath sounds: Normal breath sounds.  Musculoskeletal:        General: Deformity present. Normal range of motion.     Cervical back: Normal range of motion.     Left knee: Deformity present.       Legs:     Comments: Surgical scar   Skin:    General: Skin is warm and dry.  Neurological:      General: No focal deficit present.     Mental Status: He is alert and oriented to person, place, and time. Mental status is at baseline.  Psychiatric:        Mood and Affect: Mood normal.  Behavior: Behavior normal.        Thought Content: Thought content normal.        Judgment: Judgment normal.     Lab Results  Component Value Date   HGBA1C 5.4 10/02/2023   HGBA1C 5.8 10/29/2022    Lab Results  Component Value Date   CREATININE 1.13 04/05/2024   CREATININE 1.09 10/02/2023   CREATININE 1.07 05/27/2023    Lab Results  Component Value Date   WBC 9.1 10/02/2023   HGB 12.7 (L) 10/02/2023   HCT 39.7 10/02/2023   PLT 274 10/02/2023   GLUCOSE 96 04/05/2024   CHOL 118 10/02/2023   TRIG 114 10/02/2023   HDL 38 (L) 10/02/2023   LDLDIRECT 68 10/02/2023   LDLCALC 60 10/02/2023   ALT 24 04/05/2024   AST 22 04/05/2024   NA 140 04/05/2024   K 4.4 04/05/2024   CL 106 04/05/2024   CREATININE 1.13 04/05/2024   BUN 17 04/05/2024   CO2 26 04/05/2024   TSH 2.85 10/02/2023   PSA 0.30 10/29/2022   INR 1.16 06/26/2018   HGBA1C 5.4 10/02/2023   MICROALBUR 0.6 10/02/2023    No results found.  Assessment & Plan:  .Muscle weakness of lower extremity  HYPERTENSION, BENIGN Assessment & Plan: Well controlled on current regimen. Renal function is due for assessment. , no changes today.  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 Managed by [facility].  He requires supervision of medications which is done by his family (ex wife)  and is need of a home health aide to assist him in managing hygiene,. Meals and light chores around the house.    Home health referral tor aide has been ordered.     Recurrent falls while walking Assessment & Plan: SECONDARY TO POOR BALANCE AND PROXIMAL MUSCLE WEAKNESS  .  Will repeat HH referral for PT for leg strengthening   Orders: -     Ambulatory referral to Home Health  BPH with obstruction/lower urinary tract symptoms -     Alfuzosin  HCl ER; Take 1 tablet (10 mg total) by mouth daily.  Dispense: 90 tablet; Refill: 1 -     Finasteride ; Take 1 tablet (5 mg total) by mouth daily.  Dispense: 90 tablet; Refill: 1  Nail biting  Intention tremor Assessment & Plan: Resolved with reduction in risperidoen dose     .   Follow-up: No follow-ups on file.   Verneita LITTIE Kettering, MD

## 2024-04-05 NOTE — Assessment & Plan Note (Signed)
 Currenlty taking aricept  and memantine. Managed by [facility].  He requires supervision of medications which is done by his family (ex wife)  and is need of a home health aide to assist him in managing hygiene,. Meals and light chores around the house.   Home health referral tor aide has been ordered.

## 2024-04-05 NOTE — Assessment & Plan Note (Signed)
 SECONDARY TO POOR BALANCE AND PROXIMAL MUSCLE WEAKNESS  .  Will repeat [facility] referral for PT for leg strengthening

## 2024-04-05 NOTE — Assessment & Plan Note (Signed)
 Resolved with reduction in risperidoen dose

## 2024-04-07 ENCOUNTER — Ambulatory Visit: Payer: Self-pay | Admitting: Internal Medicine

## 2024-04-27 ENCOUNTER — Encounter: Payer: Self-pay | Admitting: Internal Medicine

## 2024-04-27 NOTE — Telephone Encounter (Signed)
 Pt's caregiver [name] is wanting to know if the referral for home health was sent to either [facility] or [facility] instead of [facility].

## 2024-05-09 ENCOUNTER — Other Ambulatory Visit: Payer: Self-pay | Admitting: Cardiovascular Disease

## 2024-05-20 ENCOUNTER — Ambulatory Visit: Admitting: Podiatry

## 2024-05-20 DIAGNOSIS — G252 Other specified forms of tremor: Secondary | ICD-10-CM

## 2024-05-20 DIAGNOSIS — I10 Essential (primary) hypertension: Secondary | ICD-10-CM | POA: Diagnosis not present

## 2024-05-20 DIAGNOSIS — Z602 Problems related to living alone: Secondary | ICD-10-CM

## 2024-05-20 DIAGNOSIS — M79674 Pain in right toe(s): Secondary | ICD-10-CM

## 2024-05-20 DIAGNOSIS — R296 Repeated falls: Secondary | ICD-10-CM

## 2024-05-20 DIAGNOSIS — Z556 Problems related to health literacy: Secondary | ICD-10-CM

## 2024-05-20 DIAGNOSIS — F988 Other specified behavioral and emotional disorders with onset usually occurring in childhood and adolescence: Secondary | ICD-10-CM | POA: Diagnosis not present

## 2024-05-20 DIAGNOSIS — M79675 Pain in left toe(s): Secondary | ICD-10-CM

## 2024-05-20 DIAGNOSIS — Z7982 Long term (current) use of aspirin: Secondary | ICD-10-CM

## 2024-05-20 DIAGNOSIS — F0280 Dementia in other diseases classified elsewhere without behavioral disturbance: Secondary | ICD-10-CM | POA: Diagnosis not present

## 2024-05-20 DIAGNOSIS — N400 Enlarged prostate without lower urinary tract symptoms: Secondary | ICD-10-CM

## 2024-05-20 DIAGNOSIS — B351 Tinea unguium: Secondary | ICD-10-CM

## 2024-05-20 DIAGNOSIS — G3109 Other frontotemporal dementia: Secondary | ICD-10-CM | POA: Diagnosis not present

## 2024-05-20 NOTE — Progress Notes (Signed)
   Chief Complaint  Patient presents with   Toe Pain    RFC. *patient has dementia ,wife will be present.     SUBJECTIVE Patient presents to office today complaining of elongated, thickened nails that cause pain while ambulating in shoes.  Patient is unable to trim their own nails. Patient is here for further evaluation and treatment.  Past Medical History:  Diagnosis Date   Beta thalassemia minor    CAD (coronary artery disease)    Cardiovascular stress test abnormal    Jan 2010 no significant ischemia   CKD (chronic kidney disease), stage III (HCC)    Dementia (HCC)    08/03/23 -still able to sign own papers)   Hyperlipidemia    Hypertension    Presence of bare metal stent in right coronary artery    proximal RCA adn LAD in April 2009   PVCs (premature ventricular contractions)    Sleep apnea    Has support machine, will NOT wear   Trigger finger    Wears dentures    has partial upper and lower, does not wear   Wears hearing aid in left ear    Has, does not wear    Allergies  Allergen Reactions   Penicillins Anaphylaxis, Rash and Other (See Comments)   Clarithromycin Diarrhea, Nausea Only and Other (See Comments)   Quetiapine  Other (See Comments)    Excessive sedation   Codeine Nausea Only and Nausea And Vomiting    Sever vomiting    Iodinated Contrast Media Rash   Metrizamide Rash   Promethazine Palpitations     OBJECTIVE General Patient is awake, alert, and oriented x 3 and in no acute distress. Derm Skin is dry and supple bilateral. Negative open lesions or macerations. Remaining integument unremarkable. Nails are tender, long, thickened and dystrophic with subungual debris, consistent with onychomycosis, 1-5 bilateral. No signs of infection noted. Vasc  DP and PT pedal pulses palpable bilaterally. Temperature gradient within normal limits.  Neuro Epicritic and protective threshold sensation grossly intact bilaterally.  Musculoskeletal Exam No symptomatic  pedal deformities noted bilateral. Muscular strength within normal limits.  ASSESSMENT 1.  Pain due to onychomycosis of toenails both  PLAN OF CARE 1. Patient evaluated today.  2. Instructed to maintain good pedal hygiene and foot care.  3. Mechanical debridement of nails 1-5 bilaterally performed using a nail nipper. Filed with dremel without incident.  4. Return to clinic in 3 mos.    Thresa EMERSON Sar, DPM [facility]  Dr. Thresa EMERSON Sar, DPM    [address] [place], [place] [address]                Office [phone]  Fax [phone]

## 2024-05-23 ENCOUNTER — Telehealth: Payer: Self-pay

## 2024-05-23 NOTE — Telephone Encounter (Signed)
 Copied from CRM [id number]. Topic: Clinical - Medical Advice >> May 23, 2024  4:11 PM Anairis L wrote: Reason for CRM: Chyrl from [facility] is calling in today because he went to visit Mr. Huss and his HR resting was 40.Typically when a HR is under 50 they have to call the provider. Mrs Bakos informed him 76 is a normal HR for me. Chyrl would like a call back at [phone] for  verbal updated safe perimeters for patient. It is safe to leave a VM.   Thank you

## 2024-05-23 NOTE — Telephone Encounter (Signed)
 duplicate

## 2024-05-24 ENCOUNTER — Encounter: Payer: Self-pay | Admitting: Internal Medicine

## 2024-05-25 ENCOUNTER — Telehealth: Payer: Self-pay | Admitting: Cardiovascular Disease

## 2024-05-25 NOTE — Telephone Encounter (Signed)
 Spoke with Edwin Woodward from home care. Edwin Woodward reports that patient's heart rate has dropped into 40's but patient remains asymptomatic. Edwin Woodward reports that their policy is to notify the MD anytime patient's heart rate drops below 50. Edwin Woodward calling to see if Dr. Perla would be willing to sign an order changing the parameter to 40.

## 2024-05-25 NOTE — Telephone Encounter (Signed)
  Chyrl, the PT, called and would like to ask Dr. Gollan if a request can be sent to update the patient's safe heart rate parameters. The patient's heart rate has consistently been low, and instead of the current threshold of 50, he is requesting that Dr. Gollan be contacted only if the heart rate drops below 40. He mentioned that a detailed message can be left on his voicemail. If Dr. Gollan agrees, they can proceed with sending the order request for his signature.

## 2024-05-25 NOTE — Telephone Encounter (Signed)
 Called and left message for Chyrl, PT to call back.

## 2024-05-30 NOTE — Telephone Encounter (Signed)
 Florence Mott, PT and notified him of the following.   We can change parameters to 40 bpm His low heart rate is secondary to PVCs (which we will not read on the blood pressure cuff or pulse meters) not from sinus bradycardia Thx TGollan  Edwin Woodward states that will fax order for updated parameter.

## 2024-06-14 ENCOUNTER — Telehealth: Payer: Self-pay | Admitting: Internal Medicine

## 2024-06-14 NOTE — Telephone Encounter (Signed)
 I have placed in quick sign folder for signature.

## 2024-06-14 NOTE — Telephone Encounter (Signed)
 Patient dropped off a [employer] parking plaqcard. Form is up front in Dr Altha color folder. Please call when ready.

## 2024-06-15 ENCOUNTER — Other Ambulatory Visit: Payer: Self-pay | Admitting: Cardiovascular Disease

## 2024-06-17 NOTE — Telephone Encounter (Signed)
 FYI..Form has been picked up.

## 2024-07-09 ENCOUNTER — Other Ambulatory Visit: Payer: Self-pay | Admitting: Cardiovascular Disease

## 2024-07-14 DIAGNOSIS — G252 Other specified forms of tremor: Secondary | ICD-10-CM

## 2024-07-14 DIAGNOSIS — G3109 Other frontotemporal dementia: Secondary | ICD-10-CM | POA: Diagnosis not present

## 2024-07-14 DIAGNOSIS — F988 Other specified behavioral and emotional disorders with onset usually occurring in childhood and adolescence: Secondary | ICD-10-CM | POA: Diagnosis not present

## 2024-07-14 DIAGNOSIS — F0280 Dementia in other diseases classified elsewhere without behavioral disturbance: Secondary | ICD-10-CM | POA: Diagnosis not present

## 2024-07-14 DIAGNOSIS — Z7982 Long term (current) use of aspirin: Secondary | ICD-10-CM

## 2024-07-14 DIAGNOSIS — Z602 Problems related to living alone: Secondary | ICD-10-CM

## 2024-07-14 DIAGNOSIS — Z556 Problems related to health literacy: Secondary | ICD-10-CM

## 2024-07-14 DIAGNOSIS — N138 Other obstructive and reflux uropathy: Secondary | ICD-10-CM

## 2024-07-14 DIAGNOSIS — E669 Obesity, unspecified: Secondary | ICD-10-CM

## 2024-07-14 DIAGNOSIS — I10 Essential (primary) hypertension: Secondary | ICD-10-CM | POA: Diagnosis not present

## 2024-07-14 DIAGNOSIS — N401 Enlarged prostate with lower urinary tract symptoms: Secondary | ICD-10-CM

## 2024-07-23 ENCOUNTER — Other Ambulatory Visit: Payer: Self-pay | Admitting: Cardiovascular Disease

## 2024-08-04 ENCOUNTER — Emergency Department: Admission: EM | Admit: 2024-08-04 | Discharge: 2024-08-09 | Disposition: A | Discharge status: Skilled Nursing Facility

## 2024-08-04 ENCOUNTER — Emergency Department

## 2024-08-04 ENCOUNTER — Other Ambulatory Visit: Payer: Self-pay

## 2024-08-04 DIAGNOSIS — G3109 Other frontotemporal dementia: Secondary | ICD-10-CM | POA: Diagnosis not present

## 2024-08-04 DIAGNOSIS — X58XXXA Exposure to other specified factors, initial encounter: Secondary | ICD-10-CM | POA: Insufficient documentation

## 2024-08-04 DIAGNOSIS — M79671 Pain in right foot: Secondary | ICD-10-CM

## 2024-08-04 DIAGNOSIS — M6281 Muscle weakness (generalized): Secondary | ICD-10-CM | POA: Diagnosis not present

## 2024-08-04 DIAGNOSIS — T148XXA Other injury of unspecified body region, initial encounter: Secondary | ICD-10-CM

## 2024-08-04 DIAGNOSIS — R262 Difficulty in walking, not elsewhere classified: Secondary | ICD-10-CM | POA: Insufficient documentation

## 2024-08-04 DIAGNOSIS — S92241A Displaced fracture of medial cuneiform of right foot, initial encounter for closed fracture: Secondary | ICD-10-CM | POA: Diagnosis not present

## 2024-08-04 DIAGNOSIS — F0280 Dementia in other diseases classified elsewhere without behavioral disturbance: Secondary | ICD-10-CM

## 2024-08-04 DIAGNOSIS — I2510 Atherosclerotic heart disease of native coronary artery without angina pectoris: Secondary | ICD-10-CM | POA: Insufficient documentation

## 2024-08-04 DIAGNOSIS — S99921A Unspecified injury of right foot, initial encounter: Secondary | ICD-10-CM | POA: Diagnosis present

## 2024-08-04 DIAGNOSIS — R001 Bradycardia, unspecified: Secondary | ICD-10-CM | POA: Diagnosis not present

## 2024-08-04 DIAGNOSIS — I10 Essential (primary) hypertension: Secondary | ICD-10-CM | POA: Insufficient documentation

## 2024-08-04 LAB — CBC WITH DIFFERENTIAL/PLATELET
Abs Immature Granulocytes: 0.05 K/uL (ref 0.00–0.07)
Basophils Absolute: 0 K/uL (ref 0.0–0.1)
Basophils Relative: 0 %
Eosinophils Absolute: 0.1 K/uL (ref 0.0–0.5)
Eosinophils Relative: 1 %
HCT: 36.1 % — ABNORMAL LOW (ref 39.0–52.0)
Hemoglobin: 11.8 g/dL — ABNORMAL LOW (ref 13.0–17.0)
Immature Granulocytes: 0 %
Lymphocytes Relative: 12 %
Lymphs Abs: 1.4 K/uL (ref 0.7–4.0)
MCH: 21.6 pg — ABNORMAL LOW (ref 26.0–34.0)
MCHC: 32.7 g/dL (ref 30.0–36.0)
MCV: 66.1 fL — ABNORMAL LOW (ref 80.0–100.0)
Monocytes Absolute: 1 K/uL (ref 0.1–1.0)
Monocytes Relative: 9 %
Neutro Abs: 8.7 K/uL — ABNORMAL HIGH (ref 1.7–7.7)
Neutrophils Relative %: 78 %
Platelets: 218 K/uL (ref 150–400)
RBC: 5.46 MIL/uL (ref 4.22–5.81)
RDW: 15.9 % — ABNORMAL HIGH (ref 11.5–15.5)
WBC: 11.3 K/uL — ABNORMAL HIGH (ref 4.0–10.5)
nRBC: 0 % (ref 0.0–0.2)

## 2024-08-04 LAB — BASIC METABOLIC PANEL WITH GFR
Anion gap: 11 (ref 5–15)
BUN: 16 mg/dL (ref 8–23)
CO2: 26 mmol/L (ref 22–32)
Calcium: 9.2 mg/dL (ref 8.9–10.3)
Chloride: 102 mmol/L (ref 98–111)
Creatinine, Ser: 1.07 mg/dL (ref 0.61–1.24)
GFR, Estimated: >60 mL/min (ref >60)
Glucose, Bld: 98 mg/dL (ref 70–99)
Potassium: 3.9 mmol/L (ref 3.5–5.1)
Sodium: 139 mmol/L (ref 135–145)

## 2024-08-04 LAB — MAGNESIUM: Magnesium: 2.1 mg/dL (ref 1.7–2.4)

## 2024-08-04 LAB — PROCALCITONIN: Procalcitonin: <0.1 ng/mL

## 2024-08-04 LAB — C-REACTIVE PROTEIN: CRP: 1.2 mg/dL — ABNORMAL HIGH (ref <1.0)

## 2024-08-04 LAB — SEDIMENTATION RATE: Sed Rate: 15 mm/h (ref 0–20)

## 2024-08-04 LAB — TSH: TSH: 2.52 u[IU]/mL (ref 0.350–4.500)

## 2024-08-04 MED ORDER — MEMANTINE HCL 5 MG PO TABS
5.0000 mg | ORAL_TABLET | Freq: Two times a day (BID) | ORAL | Status: DC
  Administered 2024-08-04 – 2024-08-09 (×11): 5 mg via ORAL
  Filled 2024-08-04 (×11): qty 1

## 2024-08-04 MED ORDER — ASPIRIN 81 MG PO TBEC
81.0000 mg | DELAYED_RELEASE_TABLET | Freq: Every day | ORAL | Status: DC
  Administered 2024-08-04 – 2024-08-09 (×6): 81 mg via ORAL
  Filled 2024-08-04 (×6): qty 1

## 2024-08-04 MED ORDER — FINASTERIDE 5 MG PO TABS
5.0000 mg | ORAL_TABLET | Freq: Every day | ORAL | Status: DC
  Administered 2024-08-04 – 2024-08-09 (×6): 5 mg via ORAL
  Filled 2024-08-04 (×6): qty 1

## 2024-08-04 MED ORDER — SODIUM CHLORIDE 0.9 % IV BOLUS
1000.0000 mL | Freq: Once | INTRAVENOUS | Status: AC
  Administered 2024-08-04: 1000 mL via INTRAVENOUS

## 2024-08-04 MED ORDER — DONEPEZIL HCL 5 MG PO TABS
10.0000 mg | ORAL_TABLET | Freq: Every day | ORAL | Status: DC
  Administered 2024-08-04 – 2024-08-08 (×5): 10 mg via ORAL
  Filled 2024-08-04 (×6): qty 2

## 2024-08-04 MED ORDER — HYDROCODONE-ACETAMINOPHEN 5-325 MG PO TABS
1.0000 | ORAL_TABLET | Freq: Once | ORAL | Status: AC
  Administered 2024-08-04: 1 via ORAL
  Filled 2024-08-04: qty 1

## 2024-08-04 MED ORDER — MAGNESIUM OXIDE -MG SUPPLEMENT 400 (240 MG) MG PO TABS
200.0000 mg | ORAL_TABLET | Freq: Every day | ORAL | Status: DC
  Administered 2024-08-04 – 2024-08-08 (×5): 200 mg via ORAL
  Filled 2024-08-04 (×5): qty 1

## 2024-08-04 MED ORDER — CYANOCOBALAMIN 500 MCG PO TABS
1000.0000 ug | ORAL_TABLET | Freq: Every day | ORAL | Status: DC
  Administered 2024-08-04 – 2024-08-09 (×6): 1000 ug via ORAL
  Filled 2024-08-04 (×6): qty 2

## 2024-08-04 MED ORDER — BUPROPION HCL ER (SR) 150 MG PO TB12
150.0000 mg | ORAL_TABLET | Freq: Two times a day (BID) | ORAL | Status: DC
  Administered 2024-08-04 – 2024-08-09 (×10): 150 mg via ORAL
  Filled 2024-08-04 (×11): qty 1

## 2024-08-04 MED ORDER — MORPHINE SULFATE (PF) 4 MG/ML IV SOLN
4.0000 mg | Freq: Once | INTRAVENOUS | Status: AC
  Administered 2024-08-04: 4 mg via INTRAVENOUS
  Filled 2024-08-04: qty 1

## 2024-08-04 MED ORDER — ZINC SULFATE 220 (50 ZN) MG PO CAPS
220.0000 mg | ORAL_CAPSULE | Freq: Every day | ORAL | Status: DC
  Administered 2024-08-04 – 2024-08-09 (×6): 220 mg via ORAL
  Filled 2024-08-04 (×6): qty 1

## 2024-08-04 MED ORDER — METHOCARBAMOL 500 MG PO TABS
500.0000 mg | ORAL_TABLET | Freq: Once | ORAL | Status: AC
  Administered 2024-08-04: 500 mg via ORAL
  Filled 2024-08-04: qty 1

## 2024-08-04 MED ORDER — CALCIUM CARBONATE 1250 (500 CA) MG PO TABS
1250.0000 mg | ORAL_TABLET | Freq: Two times a day (BID) | ORAL | Status: DC
  Administered 2024-08-04 – 2024-08-09 (×10): 1250 mg via ORAL
  Filled 2024-08-04 (×11): qty 1

## 2024-08-04 MED ORDER — ONDANSETRON HCL 4 MG/2ML IJ SOLN
4.0000 mg | Freq: Once | INTRAMUSCULAR | Status: AC
  Administered 2024-08-04: 4 mg via INTRAVENOUS
  Filled 2024-08-04: qty 2

## 2024-08-04 MED ORDER — VITAMIN C 500 MG PO TABS
1000.0000 mg | ORAL_TABLET | Freq: Every day | ORAL | Status: DC
  Administered 2024-08-04 – 2024-08-09 (×6): 1000 mg via ORAL
  Filled 2024-08-04 (×7): qty 2

## 2024-08-04 MED ORDER — VITAMIN D 25 MCG (1000 UNIT) PO TABS
1000.0000 [IU] | ORAL_TABLET | Freq: Every day | ORAL | Status: DC
  Administered 2024-08-04 – 2024-08-09 (×6): 1000 [IU] via ORAL
  Filled 2024-08-04 (×6): qty 1

## 2024-08-04 MED ORDER — NYSTATIN 100000 UNIT/GM EX POWD
1.0000 | Freq: Two times a day (BID) | CUTANEOUS | Status: DC
  Administered 2024-08-07 – 2024-08-09 (×4): 1 via TOPICAL
  Filled 2024-08-04: qty 15

## 2024-08-04 MED ORDER — MELATONIN 5 MG PO TABS
5.0000 mg | ORAL_TABLET | Freq: Every day | ORAL | Status: DC
  Administered 2024-08-04 – 2024-08-08 (×5): 5 mg via ORAL
  Filled 2024-08-04 (×5): qty 1

## 2024-08-04 MED ORDER — ALFUZOSIN HCL ER 10 MG PO TB24
10.0000 mg | ORAL_TABLET | Freq: Every evening | ORAL | Status: DC
  Administered 2024-08-04 – 2024-08-08 (×5): 10 mg via ORAL
  Filled 2024-08-04 (×8): qty 1

## 2024-08-04 MED ORDER — MAGNESIUM 250 MG PO TABS: ORAL_TABLET | Freq: Every day | ORAL | Status: DC

## 2024-08-04 MED ORDER — CITALOPRAM HYDROBROMIDE 20 MG PO TABS
30.0000 mg | ORAL_TABLET | Freq: Every day | ORAL | Status: DC
  Administered 2024-08-04 – 2024-08-09 (×6): 30 mg via ORAL
  Filled 2024-08-04 (×6): qty 2

## 2024-08-04 MED ORDER — RISAQUAD PO CAPS
1.0000 | ORAL_CAPSULE | Freq: Every day | ORAL | Status: DC
  Administered 2024-08-04 – 2024-08-09 (×6): 1 via ORAL
  Filled 2024-08-04 (×6): qty 1

## 2024-08-04 MED ORDER — ATORVASTATIN CALCIUM 20 MG PO TABS
40.0000 mg | ORAL_TABLET | Freq: Every day | ORAL | Status: DC
  Administered 2024-08-04 – 2024-08-09 (×6): 40 mg via ORAL
  Filled 2024-08-04 (×6): qty 2

## 2024-08-04 NOTE — ED Provider Notes (Signed)
 Care of this patient assumed from prior physician at 1500 pending ultrasound and disposition. Please see prior physician note for further details.  Briefly this is a 73 year old male with frontotemporal dementia presenting with right foot pain and inability to ambulate.  X-Bethann Qualley demonstrated an avulsion fracture of the cuneiform.  Placed in a walking boot with plans for outpatient follow-up with podiatry.  Ultrasound of the lower extremity pending.  Concerns about patient's ability be safely discharged, plan for [facility] consult when completed.   Clinical Course as of 08/04/24 1621  Thu Aug 04, 2024  1233 DG Foot Complete Right Patient does have a tiny avulsion fragment on his cuneiform.  Will place him in a boot [HD]  1319 TSH: 2.520 [HD]  1323 Calling: Edwin Woodward [HD]  1332 I talked with the patient's significant other Edwin Woodward.  Patient lives at home alone with home health care that intermittently comes.  She herself is elderly, and recently had a heart attack.  Given that he is now in a walking boot feels that he may need more resources and possible SNF placement.  Will make the patient boarder status  [HD]  1356 Case discussed with on-call orthopedist via epic chat Dr. Arlyss Schneider.  He agrees with walking boot and outpatient follow-up with podiatry.  Agrees that this was likely secondary to a low mechanism incident [HD]  1508 Patient signed out to oncoming physician.  We are pending the venous ultrasound results but I did order the patient's home medications.  There was a plan to make the patient a boarder with PT OT after the ultrasound results [HD]  1539 Delay in ultrasound read.  [facility] radiology notified, will ensure this gets read by a radiologist.  [NR]  [phone] Ultrasound without evidence of DVT.  Will place TOC as well as PT and OT consults.  Home meds ordered by prior physician. [NR]    Clinical Course User Index [HD] Nicholaus Rolland FORBES, MD [NR] Levander Slate, MD      Levander Slate,  MD 08/04/24 [phone]

## 2024-08-04 NOTE — ED Notes (Signed)
 Called ccmd for pt cardiac monitoring

## 2024-08-04 NOTE — ED Triage Notes (Signed)
 Pt coming from home via EMS. A/O x4. Pt reporting Right foot pain that began Tuesday while he was walking. Pt states pain has increased so much he could no longer get out of bed. EMS reporting slow HR but pt states his cardiologist told him that's normal for him. Hx of GERD and HTN.   EMS vitals HR 66 BP 130's/70's  SPO2 98%

## 2024-08-04 NOTE — ED Provider Notes (Signed)
 [facility] Provider Note    Event Date/Time   First MD Initiated Contact with Patient 08/04/24 1134     (approximate)   History   Foot Pain (Right Foot pain)   HPI  Edwin Woodward is a 73 y.o. 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  Other initial documentation states that pain began Tuesday patient later states that it began Sunday after walking a copious amount.  He states that the pain is so bad that he can no longer get out of bed.  He denies any further trauma.  Denies any headache hearing or vision changes, neck pain chest pain shortness of breath changes in urinary or bowel habits.   Further history is obtained by the patient's legal guardian Edwin Woodward via telephone after the patient's workup.  Patient lives at home but his estranged wife is the legal guardian and primary caregiver as he develops signs of dementia during the day worse and she cannot legally separate from him.  Unfortunately she herself has recently become ill with an MI.  Although he has home health they are intermittent and do not come regularly.  She does not think she can care for him if he is not ambulatory at home anymore   Physical Exam   Triage Vital Signs: ED Triage Vitals  Encounter Vitals Group     BP 08/04/24 1142 (!) 144/64     Girls Systolic BP Percentile --      Girls Diastolic BP Percentile --      Boys Systolic BP Percentile --      Boys Diastolic BP Percentile --      Pulse Rate 08/04/24 1142 (!) 32     Resp 08/04/24 1142 18     Temp 08/04/24 1142 97.6 F (36.4 C)     Temp Source 08/04/24 1142 Oral     SpO2 08/04/24 1142 100 %     Weight --      Height --      Head Circumference --      [facility] Flow --      Pain Score 08/04/24 1144 8     Pain Loc --      Pain Education  --      Exclude from Growth Chart --     Most recent vital signs: Vitals:   08/04/24 1300 08/04/24 1330  BP: (!) 140/69 (!) 140/68  Pulse: 66 66  Resp: (!) 21 13  Temp:    SpO2: 93% 100%    Nursing Triage Note reviewed. Vital signs reviewed and patients oxygen saturation is normoxic  General: Patient is well nourished, well developed, awake and alert, resting comfortably in no acute distress, does arrive and has feces on his abdomen Head: Normocephalic and atraumatic Eyes: Normal inspection, extraocular muscles intact, no conjunctival pallor Ear, nose, throat: Normal external exam Neck: Normal range of motion Respiratory: Patient is in no respiratory distress, lungs CTAB Cardiovascular: Patient is not tachycardic, RR GI: Abd SNT with no guarding or rebound  Back: Normal inspection of the back with good strength and range of motion throughout all ext Extremities: pulses intact with good cap refills, no LE pitting edema or calf tenderness  Neuro: The patient is alert and oriented to person, place, not to time, very easily confused  with 5/5 bilat UE/LE strength, no  gross motor or sensory defects noted. Coordination appears to be adequate. Skin: Warm, dry, and intact Psych: normal mood and affect, no SI or HI  ED Results / Procedures / Treatments   Labs (all labs ordered are listed, but only abnormal results are displayed) Labs Reviewed  C-REACTIVE PROTEIN - Abnormal; Notable for the following components:      Result Value   CRP 1.2 (*)    All other components within normal limits  CBC WITH DIFFERENTIAL/PLATELET - Abnormal; Notable for the following components:   WBC 11.3 (*)    Hemoglobin 11.8 (*)    HCT 36.1 (*)    MCV 66.1 (*)    MCH 21.6 (*)    RDW 15.9 (*)    Neutro Abs 8.7 (*)    All other components within normal limits  SEDIMENTATION RATE  BASIC METABOLIC PANEL WITH GFR  PROCALCITONIN  MAGNESIUM   TSH     EKG EKG and rhythm strip are interpreted by  myself:   EKG: Bigeminy rhythm at heart rate of 74, normal QRS duration, QTc 397, nonspecific ST segments and T waves no ectopy EKG not consistent with Acute STEMI Rhythm strip: Bigeminy in lead II   RADIOLOGY Xray right foot: Small avulsion fracture US  venous: pending   PROCEDURES:  Critical Care performed: No  Procedures   MEDICATIONS ORDERED IN ED: Medications  alfuzosin  (UROXATRAL ) 24 hr tablet 10 mg (has no administration in time range)  ascorbic acid  (VITAMIN C ) tablet 1,000 mg (has no administration in time range)  aspirin  EC tablet 81 mg (has no administration in time range)  atorvastatin  (LIPITOR) tablet 40 mg (has no administration in time range)  buPROPion  (WELLBUTRIN  SR) 12 hr tablet 150 mg (has no administration in time range)  calcium  carbonate (OS-CAL - dosed in mg of elemental calcium ) tablet 1,250 mg (has no administration in time range)  cholecalciferol  (VITAMIN D3) 25 MCG (1000 UNIT) tablet 1,000 Units (has no administration in time range)  citalopram  (CELEXA ) tablet 30 mg (has no administration in time range)  cyanocobalamin  (VITAMIN B12) tablet 1,000 mcg (has no administration in time range)  donepezil  (ARICEPT ) tablet 10 mg (has no administration in time range)  finasteride  (PROSCAR ) tablet 5 mg (has no administration in time range)  acidophilus (RISAQUAD) capsule 1 capsule (has no administration in time range)  melatonin tablet 5 mg (has no administration in time range)  memantine  (NAMENDA ) tablet 5 mg (has no administration in time range)  nystatin  (MYCOSTATIN /NYSTOP ) topical powder 1 Application (has no administration in time range)  zinc  sulfate (50mg  elemental zinc ) capsule 220 mg (has no administration in time range)  magnesium  oxide (MAG-OX) tablet 200 mg (has no administration in time range)  morphine  (PF) 4 MG/ML injection 4 mg (4 mg Intravenous Given 08/04/24 1202)  ondansetron  (ZOFRAN ) injection 4 mg (4 mg Intravenous Given 08/04/24 1202)   sodium chloride  0.9 % bolus 1,000 mL (1,000 mLs Intravenous New Bag/Given 08/04/24 1202)  methocarbamol  (ROBAXIN ) tablet 500 mg (500 mg Oral Given 08/04/24 1203)     IMPRESSION / MDM / ASSESSMENT AND PLAN / ED COURSE                                Differential diagnosis includes, but is not limited to, fracture, contusion, DVT, anemia, electrolyte derangement   ED course: Patient presents for right foot pain.  On exam he is neurovascularly intact in that extremity and has good pulses.  Left foot is swollen however does not appear cellulitic.  An x-ray did confirm a very small avulsion fracture and his case was discussed with on-call orthopedist who recommends to and podiatry follow-up.  There was some initial concern regarding patient's heart rate however patient has not been hypotensive and upon chart review this appears to be his baseline.  Caregiver was concerned about his kidney function and creatinine is not elevated.  He has a very mild leukocytosis which may be reactive.  His case was rediscussed with his caregiver and at this time more resources are required at home especially if he cannot ambulate.  Will make him a boarder status and pursue PT OT.  Caregiver and patient in agreement with plan.  Patient will be signed out to oncoming physician   Clinical Course as of 08/04/24 1508  Thu Aug 04, 2024  1233 DG Foot Complete Right Patient does have a tiny avulsion fragment on his cuneiform.  Will place him in a boot [HD]  1319 TSH: 2.520 [HD]  1323 Calling: [name] FORBES Casco [HD]  1332 I talked with the patient's significant other Bassem Bernasconi.  Patient lives at home alone with home health care that intermittently comes.  She herself is elderly, and recently had a heart attack.  Given that he is now in a walking boot feels that he may need more resources and possible SNF placement.  Will make the patient boarder status  [HD]  1356 Case discussed with on-call orthopedist via epic chat Dr.  Arlyss Schneider.  He agrees with walking boot and outpatient follow-up with podiatry.  Agrees that this was likely secondary to a low mechanism incident [HD]  1508 Patient signed out to oncoming physician.  We are pending the venous ultrasound results but I did order the patient's home medications.  There was a plan to make the patient a boarder with PT OT after the ultrasound results [HD]    Clinical Course User Index [HD] Nicholaus Rolland FORBES, MD   -- Risk: 5 This patient has a high risk of morbidity due to further diagnostic testing or treatment. Rationale: This patient's evaluation and management involve a high risk of morbidity due to the potential severity of presenting symptoms, need for diagnostic testing, and/or initiation of treatment that may require close monitoring. The differential includes conditions with potential for significant deterioration or requiring escalation of care. Treatment decisions in the ED, including medication administration, procedural interventions, or disposition planning, reflect this level of risk. COPA: 5 The patient has the following acute or chronic illness/injury that poses a possible threat to life or bodily function: [X] : The patient has a potentially serious acute condition or an acute exacerbation of a chronic illness requiring urgent evaluation and management in the Emergency Department. The clinical presentation necessitates immediate consideration of life-threatening or function-threatening diagnoses, even if they are ultimately ruled out.   FINAL CLINICAL IMPRESSION(S) / ED DIAGNOSES   Final diagnoses:  Right foot pain  Avulsion fracture  Frontotemporal dementia (HCC)  Chronic sinus bradycardia     Rx / DC Orders   ED Discharge Orders     None        Note:  This document was prepared using Dragon voice recognition software and may include unintentional dictation errors.   Nicholaus Rolland FORBES, MD 08/04/24 [phone]

## 2024-08-05 NOTE — ED Notes (Signed)
 This RN attempt to contact pt spouse per secretary note. No answer

## 2024-08-05 NOTE — ED Notes (Signed)
 Pt changed into clean briefs, soiled briefs disposed of appropriately. Call light within reach, stretcher locked in lowest position.

## 2024-08-05 NOTE — ED Provider Notes (Signed)
 Emergency Medicine Observation Re-evaluation Note   BP (!) 163/74   Pulse 76   Temp 98.1 F (36.7 C) (Oral)   Resp 20   Ht 5' 3 (1.6 m)   Wt 86.2 kg   SpO2 93%   BMI 33.66 kg/m    ED Course / MDM   No reported events during my shift at the time of this note.   Pt is awaiting dispo from consultants   Ginnie Shams MD    Shams Ginnie, MD 08/05/24 [phone]

## 2024-08-05 NOTE — TOC Progression Note (Signed)
 Transition of Care [facility]) - Progression Note    Patient Details  Name: Edwin Woodward MRN: [id number] Date of Birth: 11/06/1950  Transition of [facility]) CM/SW Contact  Nathanael CHRISTELLA Ring, RN Phone Number: 08/05/2024, 2:10 PM  Clinical Narrative:    PT is recommending SNF, wife is in agreement.  1st choice is [facility], 2nd choice [facility], 3rd choice [facility] in [place].  [facility] started- PASRR is pending under manuel review.    Expected Discharge Plan: Skilled Nursing Facility Barriers to Discharge: Other (must enter comment) (Therapy recommendations)               Expected Discharge Plan and Services   Discharge Planning Services: CM Consult   Living arrangements for the past 2 months: Single Family Home                                       Social Drivers of Health (SDOH) Interventions SDOH Screenings   Housing: Unknown (01/01/2024)   Received from [facility]  Depression [phone]): Medium Risk (10/29/2022)  Tobacco Use: Medium Risk (04/05/2024)    Readmission Risk Interventions     No data to display

## 2024-08-05 NOTE — NC FL2 (Signed)
 [place]  MEDICAID FL2 LEVEL OF CARE FORM     IDENTIFICATION  Patient Name: Edwin Woodward Birthdate: 1950-11-06 Sex: male Admission Date (Current Location): 08/04/2024  [place] and [place] Number:  Chiropodist and Address:  [facility], [address] [place], [place] [address]      Provider Number: [id number]  Attending Physician Name and Address:  Waymond Lorelle Cummins, MD  Relative Name and Phone Number:  Jeriko Kowalke- wife- [phone]    Current Level of Care: Other (Comment) (ED border) Recommended Level of Care: Skilled Nursing Facility Prior Approval Number:    Date Approved/Denied:   PASRR Number:    Discharge Plan: SNF    Current Diagnoses: Patient Active Problem List   Diagnosis Date Noted   Recurrent falls while walking 04/05/2024   Nail biting 04/05/2024   BPH with obstruction/lower urinary tract symptoms 01/04/2019   Muscle weakness of lower extremity 07/06/2018   Orthostasis 12/09/2016   Bradycardia 12/09/2016   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) 04/01/2016   GERD (gastroesophageal reflux disease) 04/01/2016   CKD stage G2/A1, 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 04/01/2016   Gout of foot 11/26/2014   Intention tremor 06/07/2014   Left sided sciatica 06/07/2014   Musculoskeletal neck pain 01/19/2014   Allergic rhinitis 12/04/2013   Non-recurrent unilateral inguinal hernia without obstruction or gangrene 06/28/2012   Thalassemia 08/16/2011   GAD (generalized anxiety disorder) 05/14/2011   Obesity 04/11/2011   OSA on CPAP 04/11/2011   Hyperlipidemia 11/22/2009   HYPERTENSION, BENIGN 11/22/2009   CAD, NATIVE VESSEL 11/22/2009    Orientation RESPIRATION BLADDER Height & Weight     Self, Time, Situation, Place  Normal Continent Weight: 86.2 kg Height:  5' 3 (160 cm)  BEHAVIORAL SYMPTOMS/MOOD NEUROLOGICAL BOWEL NUTRITION STATUS       Continent Diet (Regular)  AMBULATORY STATUS COMMUNICATION OF NEEDS Skin   Extensive Assist Verbally Normal                       Personal Care Assistance Level of Assistance  Bathing, Feeding, Dressing Bathing Assistance: Limited assistance Feeding assistance: Independent Dressing Assistance: Limited assistance     Functional Limitations Info  Sight, Hearing, Speech Sight Info: Adequate Hearing Info: Adequate Speech Info: Adequate    SPECIAL CARE FACTORS FREQUENCY  PT (By licensed PT), OT (By licensed OT)     PT Frequency: 5 times per week OT Frequency: 5 times per week            Contractures Contractures Info: Not present    Additional Factors Info  Code Status, Allergies Code Status Info: Full Code Allergies Info: Penicillins High  Anaphylaxis, Rash, Other (See Comments)   Clarithromycin Not Specified  Diarrhea, Nausea Only, Other (See Comments)   Quetiapine  Not Specified  Other (See Comments) Excessive sedation  Codeine Low  Nausea Only, Nausea And Vomiting Sever vomiting  Iodinated Contrast Media Low  Rash   Metrizamide Low  Rash   Promethazine Low  Palpitations           Current Medications (08/05/2024):  This is the current hospital active medication list Current Facility-Administered Medications  Medication Dose Route Frequency Provider Last Rate Last Admin   acidophilus (RISAQUAD) capsule 1 capsule  1 capsule Oral Daily Nicholaus Maize E, MD   1 capsule at 08/05/24 1142   alfuzosin  (UROXATRAL ) 24 hr tablet 10 mg  10  mg Oral QPM Nicholaus Maize E, MD   10 mg at 08/04/24 2148   ascorbic acid  (VITAMIN C ) tablet 1,000 mg  1,000 mg Oral Daily Nicholaus Maize E, MD   1,000 mg at 08/05/24 1143   aspirin  EC tablet 81 mg  81 mg Oral Daily Nicholaus Maize E, MD   81 mg at 08/05/24 1143   atorvastatin  (LIPITOR) tablet 40 mg  40 mg Oral Daily Nicholaus Maize E, MD   40 mg at 08/05/24 1143   buPROPion  (WELLBUTRIN  SR) 12 hr tablet 150 mg  150 mg Oral BID Nicholaus Maize E, MD   150 mg at 08/05/24 1138   calcium  carbonate (OS-CAL - dosed in mg of elemental calcium ) tablet 1,250 mg  1,250 mg Oral BID WC Nicholaus Maize E, MD   1,250 mg at 08/05/24 1145   cholecalciferol  (VITAMIN D3) 25 MCG (1000 UNIT) tablet 1,000 Units  1,000 Units Oral Daily Nicholaus Maize BRAVO, MD   1,000 Units at 08/05/24 1144   citalopram  (CELEXA ) tablet 30 mg  30 mg Oral Daily Nicholaus Maize E, MD   30 mg at 08/05/24 1139   cyanocobalamin  (VITAMIN B12) tablet 1,000 mcg  1,000 mcg Oral Daily Davis, Hillary E, MD   1,000 mcg at 08/05/24 1146   donepezil  (ARICEPT ) tablet 10 mg  10 mg Oral QHS Nicholaus Maize E, MD   10 mg at 08/04/24 2148   finasteride  (PROSCAR ) tablet 5 mg  5 mg Oral Daily Nicholaus Maize E, MD   5 mg at 08/05/24 1138   magnesium  oxide (MAG-OX) tablet 200 mg  200 mg Oral QHS Nicholaus Maize E, MD   200 mg at 08/04/24 2147   melatonin tablet 5 mg  5 mg Oral QHS Nicholaus Maize E, MD   5 mg at 08/04/24 2147   memantine  (NAMENDA ) tablet 5 mg  5 mg Oral BID Nicholaus Maize BRAVO, MD   5 mg at 08/05/24 1146   nystatin  (MYCOSTATIN /NYSTOP ) topical powder 1 Application  1 Application Topical BID Nicholaus Maize BRAVO, MD       zinc  sulfate (50mg  elemental zinc ) capsule 220 mg  220 mg Oral Daily Nicholaus Maize E, MD   220 mg at 08/05/24 1144   Current Outpatient Medications  Medication Sig Dispense Refill   alfuzosin  (UROXATRAL ) 10 MG 24 hr tablet Take 1 tablet (10 mg total) by mouth daily. 90 tablet 1   Ascorbic Acid  (VITAMIN C ) 1000 MG tablet Take 1,000 mg by mouth daily.     aspirin  EC 81 MG tablet Take 81 mg by mouth daily.     atorvastatin  (LIPITOR) 40 MG tablet TAKE 1 TABLET BY MOUTH EVERY DAY AT 6PM. Patient must call and schedule annual appointment last attempt 15 tablet 0   buPROPion  (WELLBUTRIN  SR) 150 MG 12 hr tablet TAKE 1 TABLET BY MOUTH TWICE A DAY 60 tablet 6   calcium  carbonate 1250 MG capsule Take 1,250 mg by mouth 2 (two) times daily with a meal.     cholecalciferol  (VITAMIN  D3) 25 MCG (1000 UNIT) tablet Take 1,000 Units by mouth daily.     citalopram  (CELEXA ) 20 MG tablet Take 30 mg by mouth daily.     clindamycin  (CLEOCIN ) 300 MG capsule TAKE 2 CAPSULES BY MOUTH 1 HR BEFORE DENTAL PROCEDURE 2 capsule 1   cyanocobalamin  (VITAMIN B12) 1000 MCG tablet Take 1,000 mcg by mouth daily.     donepezil  (ARICEPT ) 10 MG tablet Take 10 mg by mouth at bedtime.  finasteride  (PROSCAR ) 5 MG tablet Take 1 tablet (5 mg total) by mouth daily. 90 tablet 1   Lactobacillus (ACIDOPHILUS PO) Take 1 capsule by mouth daily.     loratadine (CLARITIN) 10 MG tablet Take 10 mg by mouth daily.     Magnesium  250 MG TABS Take by mouth at bedtime.     Melatonin CR 3 MG TBCR Take 3 mg by mouth at bedtime.     memantine  (NAMENDA ) 5 MG tablet Take 5 mg by mouth 2 (two) times daily.     nitroGLYCERIN  (NITROSTAT ) 0.4 MG SL tablet Place 1 tablet (0.4 mg total) under the tongue every 5 (five) minutes as needed. 90 tablet 3   nystatin  (MYCOSTATIN /NYSTOP ) powder Apply 1 application topically 2 (two) times daily. 60 g 0   zinc  gluconate 50 MG tablet Take 50 mg by mouth daily.     ALPRAZolam  (XANAX ) 0.25 MG tablet Take 1 tablet (0.25 mg total) by mouth at bedtime as needed for anxiety (or agitation). (Patient not taking: Reported on 08/04/2024) 30 tablet 0   Multiple Vitamins-Minerals (PRESERVISION AREDS PO) Take by mouth in the morning and at bedtime. (Patient not taking: Reported on 08/04/2024)     pyridOXINE (VITAMIN B6) 100 MG tablet Take 100 mg by mouth daily with supper. (Patient not taking: Reported on 08/04/2024)     risperiDONE  (RISPERDAL ) 0.25 MG tablet Take 0.25 mg by mouth daily. (Patient not taking: Reported on 08/04/2024)       Discharge Medications: Please see discharge summary for a list of discharge medications.  Relevant Imaging Results:  Relevant Lab Results:   Additional Information SS# [id number]  Nathanael CHRISTELLA Ring, RN

## 2024-08-05 NOTE — ED Notes (Signed)
 Pt changed into clean briefs, soiled briefs disposed of appropriately.

## 2024-08-05 NOTE — ED Notes (Signed)
 Patient resting in room, no apparent needs voiced, vital signs stable at this time.

## 2024-08-05 NOTE — Evaluation (Signed)
 Physical Therapy Evaluation Patient Details Name: Edwin Woodward MRN: [id number] DOB: 1951/01/24 Today's Date: 08/05/2024  History of Present Illness  73 y.o. male with severe frontotemporal dementia, BPH, baseline bradycardia with known RBBB and LAFB, CAD followed by [facility] [place]), hypertension who presents to the emergency department with several days of right foot pain.  Clinical Impression  Pt was willing to participate with PT but ultimately was quite limited with what he could do/tolerate.  He showed good effort with rolling in bed to change linens, etc but overall was limited.  He needed heavy assist to get to sitting but maintained EOB balance reasonably well, however with attempts to get up to standing he became tremorous, anxious and even with considerable assist and encouragement could not hazard an attempt at standing.  Pt not at all close to his ostensible baseline of living at home alone, will need continued PT to address functional limitations.        If plan is discharge home, recommend the following: Two people to help with walking and/or transfers;A lot of help with bathing/dressing/bathroom;Assistance with cooking/housework;Direct supervision/assist for medications management;Assist for transportation;Help with stairs or ramp for entrance   Can travel by private vehicle   No    Equipment Recommendations Rolling walker (2 wheels)  Recommendations for Other Services       Functional Status Assessment Patient has had a recent decline in their functional status and demonstrates the ability to make significant improvements in function in a reasonable and predictable amount of time.     Precautions / Restrictions Precautions Precautions: Fall Required Braces or Orthoses:  (R walking boot) Restrictions Weight Bearing Restrictions Per Provider Order: Yes RLE Weight Bearing Per Provider Order: Weight bearing as tolerated Other Position/Activity Restrictions: R  walking boot for WBing      Mobility  Bed Mobility Overal bed mobility: Needs Assistance Bed Mobility: Supine to Sit, Sit to Supine, Rolling Rolling: Min assist (assist to grip rails, able to effortfully roll each direction for bedding changes)   Supine to sit: Mod assist, Max assist Sit to supine: Max assist   General bed mobility comments: pt showed good effort with    Transfers Overall transfer level: Needs assistance Equipment used: Rolling walker (2 wheels) Transfers: Sit to/from Stand             General transfer comment: Pt was willing to attempt standing from ED bed, but was too anxious, tremorous and limited to be willing to attempt actually rising when PT would count down to start to initiate upward movement    Ambulation/Gait                  Stairs            Wheelchair Mobility     Tilt Bed    Modified Rankin (Stroke Patients Only)       Balance Overall balance assessment: Needs assistance Sitting-balance support: Single extremity supported Sitting balance-Leahy Scale: Fair Sitting balance - Comments: Pt was able to maintain sitting EOB w/o assist, tended to have posterior bias w/o LOB       Standing balance comment: deferred as pt was too anxious about standing                             Pertinent Vitals/Pain Pain Assessment Pain Assessment: 0-10 Pain Location: reports minimal pain, a little bit in the R foot    Home Living Family/patient expects  to be discharged to:: Skilled nursing facility Living Arrangements: Alone Available Help at Discharge: Family (estranged wife checks in multiple times/week and does meds, groceries, etc for him) Type of Home:  (condo) Home Access: Stairs to enter Entrance Stairs-Rails:  (yes) Entrance Stairs-Number of Steps: 4   Home Layout: Able to live on main level with bedroom/bathroom Home Equipment: Agricultural Consultant (2 wheels);Cane - single point      Prior Function Prior  Level of Function : Needs assist             Mobility Comments: Pt reports he is only really out of the home when his wife takes him to [place], but using the walker he can get around in the home ADLs Comments: estranged wife sets up his pill box, grocery shops and checks in consistently     Extremity/Trunk Assessment   Upper Extremity Assessment Upper Extremity Assessment: Defer to OT evaluation    Lower Extremity Assessment Lower Extremity Assessment:  (R foot in boot, grossly 3+ to 4-/5 t/o LEs)       Communication   Communication Communication: No apparent difficulties    Cognition Arousal: Alert Behavior During Therapy: Anxious                           PT - Cognition Comments: able to answer most questions Following commands: Intact       Cueing Cueing Techniques: Verbal cues     General Comments General comments (skin integrity, edema, etc.): Pt was able to assist some with bed mobility/scooting up in bed but functional mobility, specifically attempts at standing, were limited    Exercises     Assessment/Plan    PT Assessment Patient needs continued PT services  PT Problem List Decreased strength;Decreased range of motion;Decreased activity tolerance;Decreased balance;Decreased mobility;Decreased safety awareness;Decreased knowledge of use of DME       PT Treatment Interventions DME instruction;Gait training;Therapeutic activities;Functional mobility training;Therapeutic exercise;Balance training;Patient/family education    PT Goals (Current goals can be found in the Care Plan section)  Acute Rehab PT Goals Patient Stated Goal: get back home PT Goal Formulation: With patient Time For Goal Achievement: 08/18/24 Potential to Achieve Goals: Fair    Frequency Min 2X/week     Co-evaluation               AM-PAC PT 6 Clicks Mobility  Outcome Measure Help needed turning from your back to your side while in a flat bed without using  bedrails?: A Little Help needed moving from lying on your back to sitting on the side of a flat bed without using bedrails?: A Lot Help needed moving to and from a bed to a chair (including a wheelchair)?: Total Help needed standing up from a chair using your arms (e.g., wheelchair or bedside chair)?: Total Help needed to walk in hospital room?: Total Help needed climbing 3-5 steps with a railing? : Total 6 Click Score: 9    End of Session Equipment Utilized During Treatment: Gait belt Activity Tolerance: Patient limited by fatigue Patient left: with call bell/phone within reach;in bed Nurse Communication: Mobility status PT Visit Diagnosis: Unsteadiness on feet (R26.81);Muscle weakness (generalized) (M62.81);Difficulty in walking, not elsewhere classified (R26.2)    Time: 8944-8876 PT Time Calculation (min) (ACUTE ONLY): 28 min   Charges:   PT Evaluation $PT Eval Low Complexity: 1 Low PT Treatments $Therapeutic Activity: 8-22 mins PT General Charges $$ ACUTE PT VISIT: 1 Visit  Carmin JONELLE Deed, DPT 08/05/2024, 1:41 PM

## 2024-08-05 NOTE — TOC Initial Note (Signed)
 Transition of Care [facility]) - Initial/Assessment Note    Patient Details  Name: Edwin Woodward MRN: [id number] Date of Birth: 11/05/1950  [facility]) CM/SW Contact:    Nathanael CHRISTELLA Ring, RN Phone Number: 08/05/2024, 12:14 PM  Clinical Narrative:                 Met with patient at the beside, he is alert, engaged in the conversation.  He is able to tell me that he lives alone in [place] and his Wife lives in [place].  I ask if it is okay for me to call his wife and he reports yes.  ED RN is at the beside and she reports that he has not been up walking he does have the walking boot on.   Called and spoke with his wife [name], she says they have been estranged for 15 years but during the divorce proceedings it was discovered that he had Frontal temporal dementia and that he legally could not sign divorce papers so they are still married but she lives in [place] with her finance.  She packs his pill box every week for him and calls to remind him to take his meds.  She gets exactly what he needs from the grocery store every Saturday.  She and her fiance both visit him regularly and her brother visits him every morning around 10 and they go for walks.  She provides all his transportation.  He is getting home health from [facility] and has been for a while.  As soon as Therapy sees him I will call her back, she would be okay with him going to SNF for STR and likes [facility] because it is close and her brother could easily visit him there.   Expected Discharge Plan: Skilled Nursing Facility Barriers to Discharge: Other (must enter comment) (Therapy recommendations)   Patient Goals and CMS Choice Patient states their goals for this hospitalization and ongoing recovery are:: Wife hopes that he can go to SNF for some rehab before going back home to get used to the walking boot          Expected Discharge Plan and Services   Discharge Planning Services: CM Consult   Living arrangements for  the past 2 months: Single Family Home                                      Prior Living Arrangements/Services Living arrangements for the past 2 months: Single Family Home Lives with:: Self Patient language and need for interpreter reviewed:: Yes Do you feel safe going back to the place where you live?: Yes      Need for Family Participation in Patient Care: Yes (Comment) Care giver support system in place?: Yes (comment) Current home services: DME (RW, Home Health with [facility]) Criminal Activity/Legal Involvement Pertinent to Current Situation/Hospitalization: No - Comment as needed  Activities of Daily Living      Permission Sought/Granted Permission sought to share information with : Family Supports, Oceanographer granted to share information with : Yes, Verbal Permission Granted  Share Information with NAME: Fredy Gladu  Permission granted to share info w AGENCY: SNF's  Permission granted to share info w Relationship: Wife  Permission granted to share info w Contact Information: [phone]  Emotional Assessment Appearance:: Appears stated age Attitude/Demeanor/Rapport: Engaged Affect (typically observed): Accepting, Calm Orientation: : Oriented to Self,  Oriented to Place, Oriented to  Time, Oriented to Situation Alcohol / Substance Use: Not Applicable Psych Involvement: No (comment)  Admission diagnosis:  Rt Foot Pain Patient Active Problem List   Diagnosis Date Noted   Recurrent falls while walking 04/05/2024   Nail biting 04/05/2024   BPH with obstruction/lower urinary tract symptoms 01/04/2019   Muscle weakness of lower extremity 07/06/2018   Orthostasis 12/09/2016   Bradycardia 12/09/2016   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) 04/01/2016   GERD (gastroesophageal reflux disease) 04/01/2016   CKD stage G2/A1, 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 ratio <30  mg/g 04/01/2016   Gout of foot 11/26/2014   Intention tremor 06/07/2014   Left sided sciatica 06/07/2014   Musculoskeletal neck pain 01/19/2014   Allergic rhinitis 12/04/2013   Non-recurrent unilateral inguinal hernia without obstruction or gangrene 06/28/2012   Thalassemia 08/16/2011   GAD (generalized anxiety disorder) 05/14/2011   Obesity 04/11/2011   OSA on CPAP 04/11/2011   Hyperlipidemia 11/22/2009   HYPERTENSION, BENIGN 11/22/2009   CAD, NATIVE VESSEL 11/22/2009   PCP:  Marylynn Verneita CROME, MD Pharmacy:   [facility] [phone] - [place], [place] - [address] [place] [address] Phone: [phone] Fax: [phone]     Social Drivers of Health (SDOH) Social History: SDOH Screenings   Housing: Unknown (01/01/2024)   [facility] from [facility]  Depression [phone]): Medium Risk (10/29/2022)  Tobacco Use: Medium Risk (04/05/2024)   SDOH Interventions:     Readmission Risk Interventions     No data to display

## 2024-08-05 NOTE — Evaluation (Signed)
 Occupational Therapy Evaluation Patient Details Name: Edwin Woodward MRN: [id number] DOB: 1951/02/17 Today's Date: 08/05/2024   History of Present Illness   73 y.o. male with severe frontotemporal dementia, BPH, baseline bradycardia with known RBBB and LAFB, CAD followed by [facility]iology [place]), hypertension who presents to the emergency department with several days of right foot pain.     Clinical Impressions Patient presenting with decreased Ind in self care,balance, functional mobility, transfers, endurance, and safety awareness. Patient reports being Mod I at baseline and living at home alone. Pt's ex wife assists him with medication and groceries and checks on him. She is unable to provide physical assistance.Pt does endorse pain in R foot during this session with R boot donned. He is confused about date and time.  Patient needing max A for bed mobility and once dangling on EOB he becomes very tremulous and fearful to attempt standing and declines. He returns to supine and needs max A to reposition in bed. Patient will benefit from acute OT to increase overall independence in the areas of ADLs, functional mobility, and safety awareness in order to safely discharge.     If plan is discharge home, recommend the following:   A lot of help with walking and/or transfers;A lot of help with bathing/dressing/bathroom;Assistance with cooking/housework;Assistance with feeding;Direct supervision/assist for medications management;Direct supervision/assist for financial management;Assist for transportation;Help with stairs or ramp for entrance;Supervision due to cognitive status     Functional Status Assessment   Patient has had a recent decline in their functional status and demonstrates the ability to make significant improvements in function in a reasonable and predictable amount of time.     Equipment Recommendations   Other (comment) (defer to next venue of care)       Precautions/Restrictions   Precautions Precautions: Fall Restrictions RLE Weight Bearing Per Provider Order: Weight bearing as tolerated Other Position/Activity Restrictions: R walking boot for WBing     Mobility Bed Mobility Overal bed mobility: Needs Assistance Bed Mobility: Supine to Sit, Sit to Supine, Rolling Rolling: Min assist   Supine to sit: Max assist Sit to supine: Max assist        Transfers                   General transfer comment: pt became tremulous and declined further attempts to stand from ED stretcher      Balance Overall balance assessment: Needs assistance Sitting-balance support: Single extremity supported Sitting balance-Leahy Scale: Fair                                     ADL either performed or assessed with clinical judgement   ADL Overall ADL's : Needs assistance/impaired                                       General ADL Comments: max A for LB self care     Vision Patient Visual Report: No change from baseline              Pertinent Vitals/Pain Pain Assessment Pain Assessment: 0-10 Pain Score: 4  Pain Location: reports minimal pain, a little bit in the R foot Pain Descriptors / Indicators: Aching, Discomfort Pain Intervention(s): Limited activity within patient's tolerance, Repositioned     Extremity/Trunk Assessment Upper Extremity Assessment Upper Extremity Assessment: Generalized weakness  Lower Extremity Assessment Lower Extremity Assessment: Generalized weakness       Communication Communication Communication: No apparent difficulties   Cognition Arousal: Alert Behavior During Therapy: Anxious Cognition: No apparent impairments                               Following commands: Intact       Cueing  General Comments   Cueing Techniques: Verbal cues  Pt was able to assist some with bed mobility/scooting up in bed but functional mobility,  specifically attempts at standing, were limited           Home Living Family/patient expects to be discharged to:: Skilled nursing facility Living Arrangements: Alone Available Help at Discharge: Family Type of Home:  (condo) Home Access: Stairs to enter Secretary/administrator of Steps: 4 Entrance Stairs-Rails:  (yes) Home Layout: Able to live on main level with bedroom/bathroom               Home Equipment: Agricultural Consultant (2 wheels);Cane - single point          Prior Functioning/Environment Prior Level of Function : Needs assist             Mobility Comments: Pt reports he is only really out of the home when his wife takes him to [place], but using the walker he can get around in the home ADLs Comments: estranged wife sets up his pill box, grocery shops and checks in consistently. She is unable to physically assist him.    OT Problem List: Impaired balance (sitting and/or standing);Decreased strength;Decreased safety awareness;Decreased activity tolerance   OT Treatment/Interventions: Self-care/ADL training;Balance training;Therapeutic exercise;Therapeutic activities;Patient/family education      OT Goals(Current goals can be found in the care plan section)   Acute Rehab OT Goals Patient Stated Goal: to go home OT Goal Formulation: With patient Time For Goal Achievement: 08/19/24 Potential to Achieve Goals: Fair ADL Goals Pt Will Perform Grooming: with supervision Pt Will Perform Lower Body Dressing: with supervision;sit to/from stand Pt Will Transfer to Toilet: with supervision;ambulating Pt Will Perform Toileting - Clothing Manipulation and hygiene: with supervision;sit to/from stand   OT Frequency:  Min 2X/week       AM-PAC OT 6 Clicks Daily Activity     Outcome Measure Help from another person eating meals?: None Help from another person taking care of personal grooming?: A Little Help from another person toileting, which includes using toliet,  bedpan, or urinal?: A Lot Help from another person bathing (including washing, rinsing, drying)?: A Lot Help from another person to put on and taking off regular upper body clothing?: A Little Help from another person to put on and taking off regular lower body clothing?: A Lot 6 Click Score: 16   End of Session Equipment Utilized During Treatment: Rolling walker (2 wheels) Nurse Communication: Mobility status  Activity Tolerance: Patient tolerated treatment well Patient left: in bed;with call bell/phone within reach;with bed alarm set  OT Visit Diagnosis: Unsteadiness on feet (R26.81)                Time: 1440-1500 OT Time Calculation (min): 20 min Charges:  OT General Charges $OT Visit: 1 Visit OT Evaluation $OT Eval Low Complexity: 1 Low OT Treatments $Therapeutic Activity: 8-22 mins  Izetta Claude, MS, OTR/L , CBIS ascom [phone]  08/05/24, 3:55 PM

## 2024-08-05 NOTE — ED Notes (Signed)
 Attempted to call pt's wife, Eva Allinson, to give update.

## 2024-08-06 NOTE — ED Notes (Signed)
 CCMD notified of pt placement on cardiac monitor.

## 2024-08-06 NOTE — ED Notes (Addendum)
 This RN cleaned pt and changed pt's brief, gown, bed sheet, blanket, and repositioned in the bed. Bed is low, locked, and in lowest position, call bell within reach. Pt expressed no other needs at the moment.

## 2024-08-06 NOTE — ED Notes (Addendum)
 This RN returned call for pt wife and answered her questions. Pt wife requesting each pt meal not be thrown away until next meal arrives. Pt wife also requested to talk to [facility] team. This RN sent request to [place], [place] along with pt wife phone #.

## 2024-08-06 NOTE — TOC Progression Note (Signed)
 Transition of Care [facility]) - Progression Note    Patient Details  Name: Edwin Woodward MRN: [id number] Date of Birth: [birth year]  [facility]) CM/SW Contact  Elan Mcelvain L Adley Mazurow[place], [place] Phone Number: 08/06/2024, 7:55 AM  Clinical Narrative:     Patient has no bed offers for SNF. Request in pending status.   Expected Discharge Plan: Skilled Nursing Facility Barriers to Discharge: Other (must enter comment) (Therapy recommendations)               Expected Discharge Plan and Services   Discharge Planning Services: CM Consult   Living arrangements for the past 2 months: Single Family Home                                       Social Drivers of Health (SDOH) Interventions SDOH Screenings   Housing: Unknown (01/01/2024)   Received from [facility]  Depression [phone]): Medium Risk (10/29/2022)  Tobacco Use: Medium Risk (08/05/2024)    Readmission Risk Interventions     No data to display

## 2024-08-06 NOTE — ED Notes (Signed)
 Pt provided with pericare and clean dry disposable brief d/t urinary incontinence.

## 2024-08-06 NOTE — ED Notes (Signed)
 Dinner tray given

## 2024-08-06 NOTE — ED Notes (Addendum)
 This RN cleaned pt and changed pt's brief and repositioned in the bed. Bed is low, locked, and in lowest position, call bell within reach. Pt expressed no other needs at the moment.

## 2024-08-06 NOTE — ED Provider Notes (Signed)
-----------------------------------------   3:45 AM on 08/06/2024 -----------------------------------------   Blood pressure (!) 147/69, pulse 81, temperature 98.9 F (37.2 C), temperature source Oral, resp. rate 20, height 1.6 m (5' 3), weight 86.2 kg, SpO2 95%.  The patient is calm and cooperative at this time.  There have been no acute events since the last update.  Awaiting disposition plan from [facility] team.   Gordan Huxley, MD 08/06/24 [phone]

## 2024-08-07 ENCOUNTER — Other Ambulatory Visit: Payer: Self-pay | Admitting: Cardiovascular Disease

## 2024-08-07 NOTE — TOC Progression Note (Addendum)
 Transition of Care [facility]) - Progression Note    Patient Details  Name: Edwin Woodward MRN: [id number] Date of Birth: 20-Sep-1950  [facility]) CM/SW Contact  Laurieann Friddle L Braylen Denunz[place], [place] Phone Number: 08/07/2024, 9:42 AM  Clinical Narrative:     Chart reviewed. Patient does not have any bed offers for SNF.   CSW contacted patients spouse to provide a follow-up to her request for contact yesterday.   The call went straight to voicemail. CSW left a voicemail message.   Expected Discharge Plan: Skilled Nursing Facility Barriers to Discharge: Other (must enter comment) (Therapy recommendations)               Expected Discharge Plan and Services   Discharge Planning Services: CM Consult   Living arrangements for the past 2 months: Single Family Home                                       Social Drivers of Health (SDOH) Interventions SDOH Screenings   Housing: Unknown (01/01/2024)   Received from [facility]  Depression [phone]): Medium Risk (10/29/2022)  Tobacco Use: Medium Risk (08/05/2024)    Readmission Risk Interventions     No data to display

## 2024-08-07 NOTE — ED Notes (Signed)
Lunch meal tray given at this time.  

## 2024-08-07 NOTE — ED Notes (Signed)
 Pt provided with pericare and clean dry disposable brief d/t urinary incontinence.  Transferred to hospital bed from stretcher with max assistance.

## 2024-08-07 NOTE — ED Notes (Signed)
 Pt provided with pericare, clean dry disposable brief and chux pad d/t urinary and fecal incontinence.  Pt unable to participate or roll self.  Tolerated well.

## 2024-08-08 NOTE — Progress Notes (Signed)
 Occupational Therapy Treatment Patient Details Name: Edwin Woodward MRN: [id number] DOB: 14-Apr-1951 Today's Date: 08/08/2024   History of present illness 74 y.o. male with severe frontotemporal dementia, BPH, baseline bradycardia with known RBBB and LAFB, CAD followed by [facility] [place]), hypertension who presents to the emergency department with several days of right foot pain.   OT comments  Pt seen for OT and PT co-tx to optimize safety with ADL mobility. Pt eager to participate. He requires +2 for bed mobility and standing attempts x3 with heavy posterior lean. Instruction provided in anterior weight shifts but pt demo's limited carryover. Tremors limiting efforts as well. Pt demo's motivation to improve, continues to benefit from skilled OT services.       If plan is discharge home, recommend the following:  A lot of help with bathing/dressing/bathroom;Assistance with cooking/housework;Assistance with feeding;Direct supervision/assist for medications management;Direct supervision/assist for financial management;Assist for transportation;Help with stairs or ramp for entrance;Supervision due to cognitive status;Two people to help with walking and/or transfers   Equipment Recommendations  Other (comment) (defer)    Recommendations for Other Services      Precautions / Restrictions Precautions Precautions: Fall Recall of Precautions/Restrictions: Impaired Restrictions Weight Bearing Restrictions Per Provider Order: Yes RLE Weight Bearing Per Provider Order: Weight bearing as tolerated Other Position/Activity Restrictions: R walking boot for WBing       Mobility Bed Mobility Overal bed mobility: Needs Assistance Bed Mobility: Rolling, Supine to Sit, Sit to Supine Rolling: Mod assist   Supine to sit: Mod assist, +2 for physical assistance Sit to supine: Mod assist, +2 for physical assistance   General bed mobility comments: pt showed good effort but still needs  extensive assist    Transfers Overall transfer level: Needs assistance Equipment used: Rolling walker (2 wheels) Transfers: Sit to/from Stand Sit to Stand: Mod assist, +2 physical assistance, Max assist           General transfer comment: x 3 attempts.  progressively better each attempt but unable to stand fully only clearing hips off bed     Balance Overall balance assessment: Needs assistance Sitting-balance support: Feet supported, Bilateral upper extremity supported Sitting balance-Leahy Scale: Poor Sitting balance - Comments: +1 -+2 hands on assist for safety Postural control: Posterior lean Standing balance support: Bilateral upper extremity supported Standing balance-Leahy Scale: Zero Standing balance comment: uanble to fully stand                           ADL either performed or assessed with clinical judgement   ADL                                              Extremity/Trunk Assessment              Vision       Perception     Praxis     Communication Communication Communication: No apparent difficulties   Cognition Arousal: Alert Behavior During Therapy: Anxious Cognition: No apparent impairments                               Following commands: Intact        Cueing   Cueing Techniques: Verbal cues, Tactile cues  Exercises Other Exercises Other Exercises: pt instructed in ADL transfers requiring  cues for anterior weight shift    Shoulder Instructions       General Comments      Pertinent Vitals/ Pain       Pain Assessment Pain Assessment: Faces Faces Pain Scale: Hurts even more Pain Location: R  hip - seems to be positional as his LE is flexed in ER and has likely been kept in that position for some time Pain Descriptors / Indicators: Aching, Discomfort Pain Intervention(s): Limited activity within patient's tolerance, Monitored during session, Repositioned  Home Living                                           Prior Functioning/Environment              Frequency  Min 2X/week        Progress Toward Goals  OT Goals(current goals can now be found in the care plan section)  Progress towards OT goals: Progressing toward goals  Acute Rehab OT Goals Patient Stated Goal: go home OT Goal Formulation: With patient Time For Goal Achievement: 08/19/24 Potential to Achieve Goals: Fair  Plan      Co-evaluation    PT/OT/SLP Co-Evaluation/Treatment: Yes Reason for Co-Treatment: Complexity of the patient's impairments (multi-system involvement) PT goals addressed during session: Mobility/safety with mobility;Strengthening/ROM OT goals addressed during session: Proper use of Adaptive equipment and DME      AM-PAC OT 6 Clicks Daily Activity     Outcome Measure   Help from another person eating meals?: None Help from another person taking care of personal grooming?: A Little Help from another person toileting, which includes using toliet, bedpan, or urinal?: A Lot Help from another person bathing (including washing, rinsing, drying)?: A Lot Help from another person to put on and taking off regular upper body clothing?: A Little Help from another person to put on and taking off regular lower body clothing?: A Lot 6 Click Score: 16    End of Session Equipment Utilized During Treatment: Rolling walker (2 wheels);Gait belt  OT Visit Diagnosis: Unsteadiness on feet (R26.81)   Activity Tolerance Patient tolerated treatment well   Patient Left in bed;with call bell/phone within reach;with bed alarm set   Nurse Communication          Time: 8758-8747 OT Time Calculation (min): 11 min  Charges: OT General Charges $OT Visit: 1 Visit OT Treatments $Therapeutic Activity: 8-22 mins  Warren SAUNDERS., MPH, MS, OTR/L ascom [phone] 08/08/24, 1:45 PM

## 2024-08-08 NOTE — ED Notes (Signed)
 Patient cleaned with peri wipes, linens changed. Call light within reach. Patient denies any needs at this time.

## 2024-08-08 NOTE — ED Notes (Signed)
 This RN cut up pt's breakfast and adjusted side table so pt could eat, pt ate 75% of breakfast. Pt currently resting comfortably in bed, call light within reach,

## 2024-08-08 NOTE — TOC Progression Note (Addendum)
 Transition of Care [facility]) - Progression Note    Patient Details  Name: Edwin Woodward MRN: [id number] Date of Birth: [birth year]  [facility]) CM/SW Contact  Delphine KANDICE Bring, RN Phone Number: 08/08/2024, 12:06 PM  Clinical Narrative:    Cm called wife [name]. CM gave her the facilities that had made a bed offer, [facility]. She picked [facility]. Insurance shara will be started. Patient may need update therapy notes. Therapy notes are from the 11/28. CM reached out to Therapy manager.  PASRR is still pending.   1440: PASRR requesting additional information.   CM sent information.   CM spoke with Tammy at [facility]. CM informed her that insurance has approved for patient to come. CM gave her the authorization number, [id number]  12/1-12/7. CM informed her PASRR is pending and CM have to submit additional information. She states that they will accept patient tomorrow pending PASRR.  CM has submitted the additional clinical needed for PASRR.    Expected Discharge Plan: Skilled Nursing Facility Barriers to Discharge: Other (must enter comment) (Therapy recommendations)               Expected Discharge Plan and Services   Discharge Planning Services: CM Consult   Living arrangements for the past 2 months: Single Family Home                                       Social Drivers of Health (SDOH) Interventions SDOH Screenings   Housing: Unknown (01/01/2024)   Received from [facility]  Depression [phone]): Medium Risk (10/29/2022)  Tobacco Use: Medium Risk (08/05/2024)    Readmission Risk Interventions     No data to display

## 2024-08-08 NOTE — ED Provider Notes (Signed)
-----------------------------------------   3:17 AM on 08/08/2024 -----------------------------------------   Blood pressure (!) 152/80, pulse (!) 48, temperature 99.8 F (37.7 C), temperature source Oral, resp. rate (!) 25, height 1.6 m (5' 3), weight 86.2 kg, SpO2 96%.  The patient is calm and cooperative at this time.  There have been no acute events since the last update.  Awaiting disposition plan from [facility] team.   Gordan Huxley, MD 08/08/24 [phone]

## 2024-08-08 NOTE — ED Notes (Signed)
 This RN called [name], pt's wife, to give her an update on pt's care plan.

## 2024-08-08 NOTE — Progress Notes (Addendum)
 Physical Therapy Treatment Patient Details Name: Edwin Woodward MRN: [id number] DOB: [birth year] Today's Date: 08/08/2024   History of Present Illness 73 y.o. male with severe frontotemporal dementia, BPH, baseline bradycardia with known RBBB and LAFB, CAD followed by [facility]iology [place]), hypertension who presents to the emergency department with several days of right foot pain.    PT Comments  Pt in bed, ready for co-tx session.  In regular bed today which allows for improved and safer mobility attempts.  Pt is inc of urine and stated he did not tell nursing.  He rolls left/right with mod a x 1 for care.  BLE AAROM prior to further mobility and LE's generally painful.  Pt with CAM boot on right and LE positioned in flexion and ER likely contributing to soreness in RLE.  He is able to get neutral after gentle AROM.  To EOB with mod a x 2 and bed features.  Once sitting he needs +1 - + 2 to maintain balance.  Tremors are noted in supine and do increase with upright mobility and attempts to stand.  He tries x 3 with RW and mod/max a x 2.  On first attempt he is unable to clear hips but by 3rd attempt he does clear but is unable to get fully upright.  Post lean/push with heavy cues to lean forward.  While fear does seem to affect mobility, tremors also impact ability to stand.  He returns to bed with mod a x 2 and max a x 2 to position for comfort.  He does put in max effort and would benefit from < 3 hrs of therapy at discharge.   If plan is discharge home, recommend the following:    Can travel by private vehicle     No  Equipment Recommendations  Rolling walker (2 wheels)    Recommendations for Other Services       Precautions / Restrictions Precautions Precautions: Fall Required Braces or Orthoses:  (R walking boot) Restrictions Weight Bearing Restrictions Per Provider Order: Yes RLE Weight Bearing Per Provider Order: Weight bearing as tolerated Other Position/Activity  Restrictions: R walking boot for WBing     Mobility  Bed Mobility Overal bed mobility: Needs Assistance Bed Mobility: Rolling, Supine to Sit, Sit to Supine Rolling: Mod assist   Supine to sit: Mod assist, +2 for physical assistance Sit to supine: Mod assist, +2 for physical assistance   General bed mobility comments: pt showed good effort but still needs extensive assist Patient Response: Cooperative  Transfers Overall transfer level: Needs assistance Equipment used: Rolling walker (2 wheels) Transfers: Sit to/from Stand Sit to Stand: Mod assist, +2 physical assistance, Max assist           General transfer comment: x 3 attempts.  progressively better each attempt but unable to stand fully only clearing hips off bed    Ambulation/Gait               General Gait Details: uanble   Stairs             Wheelchair Mobility     Tilt Bed Tilt Bed Patient Response: Cooperative  Modified Rankin (Stroke Patients Only)       Balance Overall balance assessment: Needs assistance Sitting-balance support: Feet supported, Bilateral upper extremity supported Sitting balance-Leahy Scale: Poor Sitting balance - Comments: +1 -+2 hands on assist for safety Postural control: Posterior lean Standing balance support: Bilateral upper extremity supported Standing balance-Leahy Scale: Zero Standing balance comment: uanble to  fully stand                            Communication Communication Communication: No apparent difficulties  Cognition Arousal: Alert Behavior During Therapy: Anxious   PT - Cognitive impairments: Difficult to assess                       PT - Cognition Comments: able to answer most questions Following commands: Intact      Cueing Cueing Techniques: Verbal cues, Tactile cues  Exercises Other Exercises Other Exercises: BLE AAROM prior to mobility x 10 Other Exercises: pt inc urine and changed prior to mobility     General Comments        Pertinent Vitals/Pain Pain Assessment Pain Assessment: Faces Faces Pain Scale: Hurts even more Pain Location: R  hip - seems to be positional as his LE is flexed in ER and has likely been kept in that position for some time Pain Descriptors / Indicators: Aching, Discomfort Pain Intervention(s): Limited activity within patient's tolerance, Monitored during session, Repositioned    Home Living                          Prior Function            PT Goals (current goals can now be found in the care plan section) Progress towards PT goals: Progressing toward goals    Frequency    Min 2X/week      PT Plan      Co-evaluation PT/OT/SLP Co-Evaluation/Treatment: Yes Reason for Co-Treatment: Complexity of the patient's impairments (multi-system involvement) PT goals addressed during session: Mobility/safety with mobility;Strengthening/ROM OT goals addressed during session: Proper use of Adaptive equipment and DME      AM-PAC PT 6 Clicks Mobility   Outcome Measure  Help needed turning from your back to your side while in a flat bed without using bedrails?: A Lot Help needed moving from lying on your back to sitting on the side of a flat bed without using bedrails?: A Lot Help needed moving to and from a bed to a chair (including a wheelchair)?: Total Help needed standing up from a chair using your arms (e.g., wheelchair or bedside chair)?: Total Help needed to walk in hospital room?: Total Help needed climbing 3-5 steps with a railing? : Total 6 Click Score: 8    End of Session Equipment Utilized During Treatment: Gait belt Activity Tolerance: Patient limited by fatigue Patient left: with call bell/phone within reach;in bed;with bed alarm set Nurse Communication: Mobility status PT Visit Diagnosis: Unsteadiness on feet (R26.81);Muscle weakness (generalized) (M62.81);Difficulty in walking, not elsewhere classified (R26.2)     Time:  8770-8747 PT Time Calculation (min) (ACUTE ONLY): 23 min  Charges:    $Therapeutic Activity: 8-22 mins PT General Charges $$ ACUTE PT VISIT: 1 Visit                   Lauraine Gills, PTA 08/08/24, 1:33 PM

## 2024-08-08 NOTE — Progress Notes (Signed)
 RE:  Edwin Woodward  Date of Birth:  06-Oct-1950 Date:  08/08/24     To Whom It May Concern:   Please be advised that the above-named patient will require a short-term nursing home stay - anticipated 30 days or less for rehabilitation and strengthening.  The plan is for return home

## 2024-08-08 NOTE — ED Notes (Signed)
 This RN and Tawni, NT put clean briefs on pt. Soiled briefs disposed of approprietly. Call light within reach, family member at bedside.

## 2024-08-08 NOTE — ED Notes (Signed)
 TOC

## 2024-08-09 ENCOUNTER — Ambulatory Visit: Admitting: Physician Assistant

## 2024-08-09 NOTE — TOC Progression Note (Signed)
 Transition of Care [facility]) - Progression Note    Patient Details  Name: Edwin Woodward MRN: [id number] Date of Birth: [birth year]  [facility]) CM/SW Contact  Nathanael CHRISTELLA Ring, RN Phone Number: 08/09/2024, 8:43 AM  Clinical Narrative:    Edwin Woodward [phone] E good from 08/09/24- 09/08/24.  Messaged Tammy at [facility] to let her know, insurance auth approved yesterday.  As soon as she has a room number CM will get ED RN to call report and set up transportation.    Expected Discharge Plan: Skilled Nursing Facility Barriers to Discharge: Other (must enter comment) (Therapy recommendations)               Expected Discharge Plan and Services   Discharge Planning Services: CM Consult   Living arrangements for the past 2 months: Single Family Home                                       Social Drivers of Health (SDOH) Interventions SDOH Screenings   Housing: Unknown (01/01/2024)   Received from [facility]  Depression [phone]): Medium Risk (10/29/2022)  Tobacco Use: Medium Risk (08/05/2024)    Readmission Risk Interventions     No data to display

## 2024-08-09 NOTE — ED Provider Notes (Signed)
-----------------------------------------   6:44 AM on 08/09/2024 -----------------------------------------   Blood pressure (!) 153/76, pulse 88, temperature 98.8 F (37.1 C), temperature source Oral, resp. rate (!) 22, height 5' 3 (1.6 m), weight 86.2 kg, SpO2 94%.  The patient is calm and cooperative at this time.  There have been no acute events since the last update.  Awaiting disposition plan from [facility] team.    Jacolyn Pae, MD 08/09/24 [phone]

## 2024-08-09 NOTE — TOC Transition Note (Addendum)
 Transition of Care [facility]) - Discharge Note   Patient Details  Name: Edwin Woodward MRN: [id number] Date of Birth: [birth year]  Transition of Care [facility]) CM/SW Contact:  Nathanael CHRISTELLA Ring, RN Phone Number: 08/09/2024, 10:30 AM   Clinical Narrative:    Patient will be going to room [address] [facility] [place].  ED nurse to call report to [phone].  ED secretary to set up non emergent EMS.  Called and left a message for patient's wife.    Final next level of care: Skilled Nursing Facility Barriers to Discharge: Barriers Resolved   Patient Goals and CMS Choice Patient states their goals for this hospitalization and ongoing recovery are:: Wife hopes that he can go to SNF for some rehab before going back home to get used to the walking boot CMS Medicare.gov Compare Post Acute Care list provided to:: Patient Represenative (must comment) Choice offered to / list presented to : Spouse      Discharge Placement PASRR number recieved: 08/09/24            Patient chooses bed at: [facility] [place] Patient to be transferred to facility by: [facility] non emergent EMS Name of family member notified: [name]- wife Patient and family notified of of transfer: 08/09/24  Discharge Plan and Services Additional resources added to the After Visit Summary for     Discharge Planning Services: CM Consult            DME Arranged: N/A                    Social Drivers of Health (SDOH) Interventions SDOH Screenings   Housing: Unknown (01/01/2024)   Received from [facility]  Depression [phone]): Medium Risk (10/29/2022)  Tobacco Use: Medium Risk (08/05/2024)     Readmission Risk Interventions     No data to display

## 2024-08-09 NOTE — ED Provider Notes (Signed)
 I received updates from our [facility] team that they were able to secure placement at [facility] for this patient.  Will get him ready for discharge and have nursing staff call report.   Claudene Rover, MD 08/09/24 [phone]

## 2024-08-09 NOTE — ED Notes (Signed)
 [facility] called [facility] with Edwin Woodward pt transfer to [facility]

## 2024-08-09 NOTE — ED Notes (Signed)
 CCMD called to discontinue cardiac monitoring

## 2024-08-09 NOTE — ED Notes (Signed)
 Pt cleaned, brief, and linen changed

## 2024-08-09 NOTE — ED Notes (Signed)
Pt cleaned and brief changed

## 2024-08-17 ENCOUNTER — Emergency Department

## 2024-08-17 ENCOUNTER — Emergency Department
Admission: EM | Admit: 2024-08-17 | Discharge: 2024-08-17 | Disposition: A | Discharge status: Home / Self Care | Source: Skilled Nursing Facility

## 2024-08-17 ENCOUNTER — Other Ambulatory Visit: Payer: Self-pay

## 2024-08-17 DIAGNOSIS — R042 Hemoptysis: Secondary | ICD-10-CM | POA: Insufficient documentation

## 2024-08-17 DIAGNOSIS — I129 Hypertensive chronic kidney disease with stage 1 through stage 4 chronic kidney disease, or unspecified chronic kidney disease: Secondary | ICD-10-CM | POA: Insufficient documentation

## 2024-08-17 DIAGNOSIS — F0390 Unspecified dementia without behavioral disturbance: Secondary | ICD-10-CM | POA: Insufficient documentation

## 2024-08-17 DIAGNOSIS — R040 Epistaxis: Secondary | ICD-10-CM | POA: Insufficient documentation

## 2024-08-17 DIAGNOSIS — N189 Chronic kidney disease, unspecified: Secondary | ICD-10-CM | POA: Diagnosis not present

## 2024-08-17 DIAGNOSIS — I2510 Atherosclerotic heart disease of native coronary artery without angina pectoris: Secondary | ICD-10-CM | POA: Diagnosis not present

## 2024-08-17 LAB — CBC
HCT: 37.4 % — ABNORMAL LOW (ref 39.0–52.0)
Hemoglobin: 11.9 g/dL — ABNORMAL LOW (ref 13.0–17.0)
MCH: 20.9 pg — ABNORMAL LOW (ref 26.0–34.0)
MCHC: 31.8 g/dL (ref 30.0–36.0)
MCV: 65.7 fL — ABNORMAL LOW (ref 80.0–100.0)
Platelets: 504 K/uL — ABNORMAL HIGH (ref 150–400)
RBC: 5.69 MIL/uL (ref 4.22–5.81)
RDW: 15.7 % — ABNORMAL HIGH (ref 11.5–15.5)
WBC: 10.1 K/uL (ref 4.0–10.5)
nRBC: 0 % (ref 0.0–0.2)

## 2024-08-17 LAB — TYPE AND SCREEN
ABO/RH(D): B POS
Antibody Screen: NEGATIVE

## 2024-08-17 LAB — COMPREHENSIVE METABOLIC PANEL WITH GFR
ALT: 80 U/L — ABNORMAL HIGH (ref 0–44)
AST: 43 U/L — ABNORMAL HIGH (ref 15–41)
Albumin: 3.5 g/dL (ref 3.5–5.0)
Alkaline Phosphatase: 106 U/L (ref 38–126)
Anion gap: 11 (ref 5–15)
BUN: 20 mg/dL (ref 8–23)
CO2: 24 mmol/L (ref 22–32)
Calcium: 9.5 mg/dL (ref 8.9–10.3)
Chloride: 104 mmol/L (ref 98–111)
Creatinine, Ser: 0.98 mg/dL (ref 0.61–1.24)
GFR, Estimated: >60 mL/min (ref >60)
Glucose, Bld: 108 mg/dL — ABNORMAL HIGH (ref 70–99)
Potassium: 4.4 mmol/L (ref 3.5–5.1)
Sodium: 139 mmol/L (ref 135–145)
Total Bilirubin: 0.4 mg/dL (ref 0.0–1.2)
Total Protein: 7.3 g/dL (ref 6.5–8.1)

## 2024-08-17 LAB — APTT: aPTT: 30 s (ref 24–36)

## 2024-08-17 LAB — LACTIC ACID, PLASMA: Lactic Acid, Venous: 1.4 mmol/L (ref 0.5–1.9)

## 2024-08-17 LAB — PROTIME-INR
INR: 1.2 (ref 0.8–1.2)
Prothrombin Time: 15.4 s — ABNORMAL HIGH (ref 11.4–15.2)

## 2024-08-17 LAB — TROPONIN T, HIGH SENSITIVITY
Troponin T High Sensitivity: 24 ng/L — ABNORMAL HIGH (ref 0–19)
Troponin T High Sensitivity: 22 ng/L — ABNORMAL HIGH (ref 0–19)

## 2024-08-17 MED ORDER — IOHEXOL 350 MG/ML SOLN
75.0000 mL | Freq: Once | INTRAVENOUS | Status: AC | PRN
  Administered 2024-08-17: 75 mL via INTRAVENOUS

## 2024-08-17 MED ORDER — DIPHENHYDRAMINE HCL 25 MG PO CAPS: 50.0000 mg | ORAL_CAPSULE | Freq: Once | ORAL | Status: AC

## 2024-08-17 MED ORDER — METHYLPREDNISOLONE SODIUM SUCC 40 MG IJ SOLR
40.0000 mg | Freq: Once | INTRAMUSCULAR | Status: AC
  Administered 2024-08-17: 40 mg via INTRAVENOUS
  Filled 2024-08-17: qty 1

## 2024-08-17 MED ORDER — DIPHENHYDRAMINE HCL 50 MG/ML IJ SOLN
50.0000 mg | Freq: Once | INTRAMUSCULAR | Status: AC
  Administered 2024-08-17: 50 mg via INTRAVENOUS
  Filled 2024-08-17: qty 1

## 2024-08-17 NOTE — ED Notes (Signed)
 Pt spouse reports that pt had a coughing episode that resulted in a large amount of blood to be expelled. This RN notified MD.

## 2024-08-17 NOTE — ED Notes (Signed)
 Called CCMD to add pt to monitoring.

## 2024-08-17 NOTE — ED Notes (Signed)
 This RN was not notified that [facility] transport had arrived to transport pt. Discharge papers not given to [facility]. Discharge vitals unable to be obtained.

## 2024-08-17 NOTE — ED Provider Notes (Signed)
 [facility] Provider Note    Event Date/Time   First MD Initiated Contact with Patient 08/17/24 [phone]     (approximate)   History   Hemoptysis   HPI  Edwin Woodward is a 73 y.o. male with a past medical history of dementia, thalassemia, CAD, CKD presenting to the emergency department via EMS from [facility] complaining of 2 days of hemoptysis.  Patient reports that he will suddenly feel as if he needs to cough and then will have a coughing fit and spit up blood.  He denies any nausea, vomiting, or diarrhea.  He denies any epistaxis and states that it does not feel as if the blood is running down from his nose.  He denies any shortness of breath, chest pain, or abdominal pain.  Patient is also noted to have a rash on his abdomen that he describes as itchy.  Patient and EMS report that they did not noticed the rash prior to placing EKG leads on the patient.     Physical Exam   Triage Vital Signs: ED Triage Vitals  Encounter Vitals Group     BP 08/17/24 0831 139/77     Girls Systolic BP Percentile --      Girls Diastolic BP Percentile --      Boys Systolic BP Percentile --      Boys Diastolic BP Percentile --      Pulse Rate 08/17/24 0831 62     Resp 08/17/24 0831 15     Temp 08/17/24 0831 98.5 F (36.9 C)     Temp Source 08/17/24 0831 Oral     SpO2 08/17/24 0830 95 %     Weight 08/17/24 0835 196 lb (88.9 kg)     Height 08/17/24 0835 5' 3 (1.6 m)     Head Circumference --      [facility] Flow --      Pain Score 08/17/24 0833 0     Pain Loc --      Pain Education --      Exclude from Growth Chart --     Most recent vital signs: Vitals:   08/17/24 0830 08/17/24 0831  BP:  139/77  Pulse:  62  Resp:  15  Temp:  98.5 F (36.9 C)  SpO2: 95% 97%     General: Awake, no distress.  CV:  Good peripheral perfusion.  Regular rate and rhythm Resp:  Normal effort.  Lungs clear to auscultation Abd:  No distention.  Nontender to  palpation Other:  Urticaria noted to abdomen/torso   ED Results / Procedures / Treatments   Labs (all labs ordered are listed, but only abnormal results are displayed) Labs Reviewed  CBC  COMPREHENSIVE METABOLIC PANEL WITH GFR  LACTIC ACID, PLASMA  PROTIME-INR  APTT  TYPE AND SCREEN  TROPONIN T, HIGH SENSITIVITY     EKG  ED ECG REPORT I, Reche CHRISTELLA Leventhal, the attending physician, personally viewed and interpreted this ECG.  Date: 08/17/2024 Rate: 58 Rhythm: normal sinus rhythm QRS Axis: Left axis deviation Intervals: normal ST/T Wave abnormalities: normal RBBB Narrative Interpretation: no evidence of acute ischemia    RADIOLOGY Chest x-ray interpreted by myself as no acute pathology.  Radiologist interpretation agrees.  CTA of the chest does not show any acute findings.    PROCEDURES:  Critical Care performed: No  Procedures   MEDICATIONS ORDERED IN ED: Medications - No data to display   IMPRESSION / MDM / ASSESSMENT AND PLAN /  ED COURSE  I reviewed the triage vital signs and the nursing notes.                              Differential diagnosis includes, but is not limited to, pulmonary hemorrhage, hemothorax, PE, bronchitis, epistaxis  Patient's presentation is most consistent with acute complicated illness / injury requiring diagnostic workup.  Patient is a 73 year old male past medical history of dementia, CKD, thalassemia, hypertension, CAD, presenting to the emergency department via EMS from his facility for 2 days of hemoptysis.  EKG, chest x-ray, CTA chest, lab work ordered for further evaluation.  The patient is noted to be allergic to contrast media with a reaction of a rash.  Discussed plan for pretreatment.  On initial examination no blood was noted to bilateral naris.  On reevaluation I do note a small amount of blood in bilateral nostrils.  No posterior oropharyngeal blood noted.  CT of the chest is unremarkable.  On reexamination  there is still blood noted in the nares though it is not actively bleeding.  No no blood present in the mouth.  The patient states that he has not had any further episodes of hemoptysis.  Discussed plan for discharge with instructions to follow-up with his primary care physician.  He will return to the emergency department for any new or worsening symptoms.      FINAL CLINICAL IMPRESSION(S) / ED DIAGNOSES   Final diagnoses:  Epistaxis  Cough with hemoptysis     Rx / DC Orders   ED Discharge Orders     None        Note:  This document was prepared using Dragon voice recognition software and may include unintentional dictation errors.   Rexford Reche HERO, MD 08/17/24 1537

## 2024-08-17 NOTE — ED Notes (Signed)
 Pt requested to go to the bathroom, states that he normally gets up and walks to the toilet without aid of any assistive devices. Pt was unable to stand at the side of the bed with assistance from staff for more than a few seconds, so pt was returned to the bed and was put on a bedpan. Pt's brief was wet with urine when it was removed. Pt stated that they were unable to have a BM at this time. Pericare was performed and a new brief was applied. Pt was repositioned in the bed with the assistance of [place], EDT. Pt's bed is in the lowest, locked position with call be in reach. Pt denies any other needs at this time.

## 2024-08-17 NOTE — ED Notes (Signed)
 This RN attempted to call report to [facility] with no answer.

## 2024-08-17 NOTE — ED Triage Notes (Signed)
 Pt arrives via [facility] from [facility] with c/o coughing up blood x 2 days. Pt denies n/v/d. During triage, RN's noticed rash on pt's skin which was not present for EMS and they stated it was new and they only thing they did was add EKG leads to pt's skin. Pt is A&Ox3 during triage.   Pt has a hx of dementia.

## 2024-08-26 ENCOUNTER — Ambulatory Visit: Admitting: Podiatry

## 2024-09-14 ENCOUNTER — Telehealth: Payer: Self-pay

## 2024-09-14 NOTE — Telephone Encounter (Signed)
 Copied from CRM #[id number]. Topic: Clinical - Medical Advice >> Sep 14, 2024 11:55 AM Deleta RAMAN wrote: Reason for CRM: patient wife is having issues with patient nursing rehab placement would like for nurse to give her a call on steps to take [phone]

## 2024-09-15 NOTE — Telephone Encounter (Signed)
 Spoke with pt's caregiver and she stated that pt is currently in [facility]. She stated that from there he will be moving into a assisted living or nursing facility so she was wanting to know if she could move his appt up sooner. Pt has been rescheduled for 09/28/2024.

## 2024-09-18 NOTE — Progress Notes (Unsigned)
 "  Cardiology Clinic Note   Date: 09/18/2024 ID: Edwin Woodward, DOB Jan 31, 1951, MRN [id number]  Primary Cardiologist:  Evalene Lunger, MD  Chief Complaint   Edwin Woodward is a 74 y.o. male who presents to the clinic today for ***  Patient Profile   Edwin Woodward is followed by Dr. Gollan for the history outlined below.      Past medical history significant for: CAD. LHC April 2009: BMS to RCA and LAD. Nuclear stress test 06/28/2018: Normal, low risk study. Frequent PVCs. 14-day Zio 06/19/2023: HR 32 to 99 bpm, average 61 bpm.  Wenckebach present.  Rare PACs.  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  No sustained arrhythmias. Echo 07/24/2023: EF 55 to 60%.  No RWMA.  Grade I DD.  Normal RV size/function.  Mild MR. Hyperlipidemia. Lipid panel 10/02/2023: Direct LDL 68, HDL 38, TG 114, total 118. OSA. GERD. 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 Gout.  In summary, patient has a history of CAD with BMS placed to RCA and LAD in 2009.  Patient was admitted to the hospital October 2019 for GI bleed complicated by vasovagal cardiac arrest, aspiration pneumonia versus chemical pneumonitis with acute respiratory failure requiring intubation.  He presented to [facility] on 06/25/2018 for chest pressure that developed the night prior.  Upon arrival to the ED he was noted to have hematemesis and hypotension with BP in the 70s systolic.  He suffered a vasovagal cardiac arrest and received CPR x 1 minute prior to improvement in vitals.  He was started on low-dose epi infusion for support and intubated for airway protection.  Interventional cardiology was called for concern of possible STEMI but given relatively benign appearing EKG, normal cardiac enzymes x 1, and hematemesis it was felt to was not a candidate for emergent cath.  Troponin peaked at 0.03.  CTA of chest and abdomen was unremarkable.  Echo demonstrated EF 55 to 60%, mild concentric LVH, no RWMA, no significant  valvular abnormalities.  Nuclear stress test performed on 06/28/2018 was low risk.  Patient required 2 units of PRBC and was discharged with hemoglobin 9.1.  EGD and colonoscopy demonstrated small gastric ulcer and a nonbleeding rectal ulcer.  Blood loss was from suspected Mallory-Weiss tear.  Patient was seen in the clinic in September 2024 and had an EKG that demonstrated frequent PVCs.  He wore a 14-day ZIO that demonstrated 28.6% PVC burden.  Echo in November 2024 showed normal LV/RV function.  Patient underwent ED evaluation for foot pain. Patient's estranged wife was contacted for more information.  She reported being his legal guardian secondary to his dementia.  He was living alone with home health part-time.  Patient's wife set up pills, went grocery shopping and visited him regularly.  She also was providing all transportation for him.  Physical therapy recommended ongoing rehab at [facility].  Unfortunately she had suffered an MI and was having difficulty caring for him.  And he was transferred to long-term care on 08/09/2024.  Patient presented to the ED on 08/17/2024 for 2-day history of coughing up blood.  Workup was unremarkable.  CTA was negative for PE.  Patient was discharged back to SNF to follow-up with PCP as an outpatient.      History of Present Illness    Today, patient ***  CAD S/p BMS to RCA and LAD April 2009.  Nuclear stress test October 2019 was abnormal, low risk study.  Patient*** - Continue aspirin , atorvastatin , as needed SL NTG.  Frequent  PVCs 14-day ZIO October 2024 demonstrated HR 32 to 99 bpm, average 61 bpm, frequent PVCs 28.6% burden.  Echo November 2024 demonstrated normal LV/RV function, Grade I DD, mild MR.  Patient*** - Not on beta-blocker secondary to history of bradycardia. - Decision made for no intervention secondary to patient being asymptomatic.  Hyperlipidemia LDL 68 January 2025, at goal. - Continue atorvastatin . - Continue to follow with  PCP.  ROS: All other systems reviewed and are otherwise negative except as noted in History of Present Illness.  EKGs/Labs Reviewed        08/17/2024: ALT 80; AST 43; BUN 20; Creatinine, Ser 0.98; Potassium 4.4; Sodium 139   08/17/2024: Hemoglobin 11.9; WBC 10.1   08/04/2024: TSH 2.520   No results found for requested labs within last 365 days.  ***  Risk Assessment/Calculations    {Does this patient have ATRIAL FIBRILLATION?:[phone]} No BP recorded.  {Refresh Note OR Click here to enter BP  :1}***        Physical Exam    VS:  There were no vitals taken for this visit. , BMI There is no height or weight on file to calculate BMI.  GEN: Well nourished, well developed, in no acute distress. Neck: No JVD or carotid bruits. Cardiac: *** RRR. *** No murmur. No rubs or gallops.   Respiratory:  Respirations regular and unlabored. Clear to auscultation without rales, wheezing or rhonchi. GI: Soft, nontender, nondistended. Extremities: Radials/DP/PT 2+ and equal bilaterally. No clubbing or cyanosis. No edema ***  Skin: Warm and dry, no rash. Neuro: Strength intact.  Assessment & Plan   ***  Disposition: ***     {Are you ordering a CV Procedure (e.g. stress test, cath, DCCV, TEE, etc)?   Press F2        :[id number]}   Signed, Barnie HERO. Magdalen Cabana, DNP, NP-C  "

## 2024-09-20 ENCOUNTER — Ambulatory Visit: Admitting: Student

## 2024-09-20 NOTE — Progress Notes (Unsigned)
 "  Cardiology Clinic Note   Date: 09/20/2024 ID: Edwin Woodward, DOB 17-Aug-1951, MRN [id number]  Primary Cardiologist:  Evalene Lunger, MD  Chief Complaint   Edwin Woodward is a 74 y.o. male who presents to the clinic today for ***  Patient Profile   Edwin Woodward is followed by Dr. Gollan for the history outlined below.      Past medical history significant for: CAD. LHC April 2009: BMS to RCA and LAD. Nuclear stress test 06/28/2018: Normal, low risk study. Frequent PVCs. 14-day Zio 06/19/2023: HR 32 to 99 bpm, average 61 bpm.  Wenckebach present.  Rare PACs.  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  No sustained arrhythmias. Echo 07/24/2023: EF 55 to 60%.  No RWMA.  Grade I DD.  Normal RV size/function.  Mild MR. Hyperlipidemia. Lipid panel 10/02/2023: Direct LDL 68, HDL 38, TG 114, total 118. OSA. GERD. 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 Gout.  In summary, patient has a history of CAD with BMS placed to RCA and LAD in 2009.  Patient was admitted to the hospital October 2019 for GI bleed complicated by vasovagal cardiac arrest, aspiration pneumonia versus chemical pneumonitis with acute respiratory failure requiring intubation.  He presented to [facility] on 06/25/2018 for chest pressure that developed the night prior.  Upon arrival to the ED he was noted to have hematemesis and hypotension with BP in the 70s systolic.  He suffered a vasovagal cardiac arrest and received CPR x 1 minute prior to improvement in vitals.  He was started on low-dose epi infusion for support and intubated for airway protection.  Interventional cardiology was called for concern of possible STEMI but given relatively benign appearing EKG, normal cardiac enzymes x 1, and hematemesis it was felt to was not a candidate for emergent cath.  Troponin peaked at 0.03.  CTA of chest and abdomen was unremarkable.  Echo demonstrated EF 55 to 60%, mild concentric LVH, no RWMA, no significant  valvular abnormalities.  Nuclear stress test performed on 06/28/2018 was low risk.  Patient required 2 units of PRBC and was discharged with hemoglobin 9.1.  EGD and colonoscopy demonstrated small gastric ulcer and a nonbleeding rectal ulcer.  Blood loss was from suspected Mallory-Weiss tear.  Patient was seen in the clinic in September 2024 and had an EKG that demonstrated frequent PVCs.  He wore a 14-day ZIO that demonstrated 28.6% PVC burden.  Echo in November 2024 showed normal LV/RV function.  Patient underwent ED evaluation for foot pain. Patient's estranged wife was contacted for more information.  She reported being his legal guardian secondary to his dementia.  He was living alone with home health part-time.  Patient's wife set up pills, went grocery shopping and visited him regularly.  She also was providing all transportation for him.  Physical therapy recommended ongoing rehab at [facility].  Unfortunately she had suffered an MI and was having difficulty caring for him.  And he was transferred to long-term care on 08/09/2024.  Patient presented to the ED on 08/17/2024 for 2-day history of coughing up blood.  Workup was unremarkable.  CTA was negative for PE.  Patient was discharged back to SNF to follow-up with PCP as an outpatient.      History of Present Illness    Today, patient ***  CAD S/p BMS to RCA and LAD April 2009.  Nuclear stress test October 2019 was abnormal, low risk study.  Patient*** - Continue aspirin , atorvastatin , as needed SL NTG.  Frequent  PVCs 14-day ZIO October 2024 demonstrated HR 32 to 99 bpm, average 61 bpm, frequent PVCs 28.6% burden.  Echo November 2024 demonstrated normal LV/RV function, Grade I DD, mild MR.  Patient*** - Not on beta-blocker secondary to history of bradycardia. - Decision made for no intervention secondary to patient being asymptomatic.  Hyperlipidemia LDL 68 January 2025, at goal. - Continue atorvastatin . - Continue to follow with  PCP.  ROS: All other systems reviewed and are otherwise negative except as noted in History of Present Illness.  EKGs/Labs Reviewed        08/17/2024: ALT 80; AST 43; BUN 20; Creatinine, Ser 0.98; Potassium 4.4; Sodium 139   08/17/2024: Hemoglobin 11.9; WBC 10.1   08/04/2024: TSH 2.520   No results found for requested labs within last 365 days.  ***  Risk Assessment/Calculations    {Does this patient have ATRIAL FIBRILLATION?[phone]} No BP recorded.  {Refresh Note OR Click here to enter BP  :1}***        Physical Exam    VS:  There were no vitals taken for this visit. , BMI There is no height or weight on file to calculate BMI.  GEN: Well nourished, well developed, in no acute distress. Neck: No JVD or carotid bruits. Cardiac: *** RRR. *** No murmur. No rubs or gallops.   Respiratory:  Respirations regular and unlabored. Clear to auscultation without rales, wheezing or rhonchi. GI: Soft, nontender, nondistended. Extremities: Radials/DP/PT 2+ and equal bilaterally. No clubbing or cyanosis. No edema ***  Skin: Warm and dry, no rash. Neuro: Strength intact.  Assessment & Plan   ***  Disposition: ***     {Are you ordering a CV Procedure (e.g. stress test, cath, DCCV, TEE, etc)?   Press F2        :[id number]}   Signed, Barnie HERO. Sarahgrace Broman, DNP, NP-C  "

## 2024-09-26 ENCOUNTER — Telehealth: Payer: Self-pay | Admitting: *Deleted

## 2024-09-26 NOTE — Telephone Encounter (Signed)
 Copied from CRM [id number]. Topic: Appointments - Scheduling Inquiry for Clinic >> Sep 26, 2024  3:15 PM Edwin Woodward wrote: Reason for CRM: Patients wife,,Eva, states patient original 6 month follow up appnt was Feb 3rd, it was moved to Jan 21, patient has covid and had to cancel Jan 21st appnt. We made a new appnt for Feb 19th and added to wait list. [name] requests a cal back concerning this, and wants to speak to someone about the appnt date. And give a Tuesday after 10

## 2024-09-28 ENCOUNTER — Ambulatory Visit: Admitting: Internal Medicine

## 2024-09-29 NOTE — Telephone Encounter (Signed)
 Spoke with pt's caregiver and scheduled a sooner appt.

## 2024-09-30 NOTE — Progress Notes (Unsigned)
 "  Cardiology Clinic Note   Date: 09/30/2024 ID: Amanuel J Gidney, DOB 10/20/1950, MRN [id number]  Primary Cardiologist:  Evalene Lunger, MD  Chief Complaint   SANI MADARIAGA is a 74 y.o. male who presents to the clinic today for ***  Patient Profile   Edwin Woodward is followed by Dr. Gollan for the history outlined below.      Past medical history significant for: CAD. LHC April 2009: BMS to RCA and LAD. Nuclear stress test 06/28/2018: Normal, low risk study. Frequent PVCs. 14-day Zio 06/19/2023: HR 32 to 99 bpm, average 61 bpm.  Wenckebach present.  Rare PACs.  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  No sustained arrhythmias. Echo 07/24/2023: EF 55 to 60%.  No RWMA.  Grade I DD.  Normal RV size/function.  Mild MR. Hyperlipidemia. Lipid panel 10/02/2023: Direct LDL 68, HDL 38, TG 114, total 118. OSA. GERD. 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 Gout.  In summary, patient has a history of CAD with BMS placed to RCA and LAD in 2009.  Patient was admitted to the hospital October 2019 for GI bleed complicated by vasovagal cardiac arrest, aspiration pneumonia versus chemical pneumonitis with acute respiratory failure requiring intubation.  He presented to [facility] on 06/25/2018 for chest pressure that developed the night prior.  Upon arrival to the ED he was noted to have hematemesis and hypotension with BP in the 70s systolic.  He suffered a vasovagal cardiac arrest and received CPR x 1 minute prior to improvement in vitals.  He was started on low-dose epi infusion for support and intubated for airway protection.  Interventional cardiology was called for concern of possible STEMI but given relatively benign appearing EKG, normal cardiac enzymes x 1, and hematemesis it was felt to was not a candidate for emergent cath.  Troponin peaked at 0.03.  CTA of chest and abdomen was unremarkable.  Echo demonstrated EF 55 to 60%, mild concentric LVH, no RWMA, no significant  valvular abnormalities.  Nuclear stress test performed on 06/28/2018 was low risk.  Patient required 2 units of PRBC and was discharged with hemoglobin 9.1.  EGD and colonoscopy demonstrated small gastric ulcer and a nonbleeding rectal ulcer.  Blood loss was from suspected Mallory-Weiss tear.  Patient was seen in the clinic in September 2024 and had an EKG that demonstrated frequent PVCs.  He wore a 14-day ZIO that demonstrated 28.6% PVC burden.  Echo in November 2024 showed normal LV/RV function.  Patient underwent ED evaluation for foot pain. Patient's estranged wife was contacted for more information.  She reported being his legal guardian secondary to his dementia.  He was living alone with home health part-time.  Patient's wife set up pills, went grocery shopping and visited him regularly.  She also was providing all transportation for him.  Physical therapy recommended ongoing rehab at [facility].  Unfortunately she had suffered an MI and was having difficulty caring for him.  And he was transferred to long-term care on 08/09/2024.  Patient presented to the ED on 08/17/2024 for 2-day history of coughing up blood.  Workup was unremarkable.  CTA was negative for PE.  Patient was discharged back to SNF to follow-up with PCP as an outpatient.      History of Present Illness    Today, patient ***  CAD S/p BMS to RCA and LAD April 2009.  Nuclear stress test October 2019 was abnormal, low risk study.  Patient*** - Continue aspirin , atorvastatin , as needed SL NTG.  Frequent  PVCs 14-day ZIO October 2024 demonstrated HR 32 to 99 bpm, average 61 bpm, frequent PVCs 28.6% burden.  Echo November 2024 demonstrated normal LV/RV function, Grade I DD, mild MR.  Patient*** - Not on beta-blocker secondary to history of bradycardia. - Decision made for no intervention secondary to patient being asymptomatic.  Hyperlipidemia LDL 68 January 2025, at goal. - Continue atorvastatin . - Continue to follow with  PCP.  ROS: All other systems reviewed and are otherwise negative except as noted in History of Present Illness.  EKGs/Labs Reviewed        08/17/2024: ALT 80; AST 43; BUN 20; Creatinine, Ser 0.98; Potassium 4.4; Sodium 139   08/17/2024: Hemoglobin 11.9; WBC 10.1   08/04/2024: TSH 2.520   No results found for requested labs within last 365 days.  ***  Risk Assessment/Calculations    {Does this patient have ATRIAL FIBRILLATION?:[phone]} No BP recorded.  {Refresh Note OR Click here to enter BP  :1}***        Physical Exam    VS:  There were no vitals taken for this visit. , BMI There is no height or weight on file to calculate BMI.  GEN: Well nourished, well developed, in no acute distress. Neck: No JVD or carotid bruits. Cardiac: *** RRR. *** No murmur. No rubs or gallops.   Respiratory:  Respirations regular and unlabored. Clear to auscultation without rales, wheezing or rhonchi. GI: Soft, nontender, nondistended. Extremities: Radials/DP/PT 2+ and equal bilaterally. No clubbing or cyanosis. No edema ***  Skin: Warm and dry, no rash. Neuro: Strength intact.  Assessment & Plan   ***  Disposition: ***     {Are you ordering a CV Procedure (e.g. stress test, cath, DCCV, TEE, etc)?   Press F2        :[id number]}   Signed, Barnie HERO. Berdell Nevitt, DNP, NP-C  "

## 2024-10-03 ENCOUNTER — Ambulatory Visit: Admitting: Student

## 2024-10-07 ENCOUNTER — Ambulatory Visit: Admitting: Student

## 2024-10-07 ENCOUNTER — Ambulatory Visit: Admitting: Internal Medicine

## 2024-10-11 ENCOUNTER — Ambulatory Visit: Admitting: Internal Medicine

## 2024-10-12 NOTE — Telephone Encounter (Signed)
 Copied from CRM #[id number]. Topic: General - Other >> Oct 12, 2024 10:02 AM Nessti S wrote: Reason for CRM: lisa called to reschedule pt 12 month f/u but she transferred me to office nurses station with no answer.

## 2024-10-12 NOTE — Progress Notes (Unsigned)
 "  Cardiology Clinic Note   Date: 10/12/2024 ID: Edwin Woodward, DOB [birth year], MRN [id number]  Primary Cardiologist:  Evalene Lunger, MD  Chief Complaint   Edwin Woodward is a 74 y.o. male who presents to the clinic today for ***  Patient Profile   Edwin Woodward is followed by Dr. Gollan for the history outlined below.      Past medical history significant for: CAD. LHC April 2009: BMS to RCA and LAD. Nuclear stress test 06/28/2018: Normal, low risk study. Frequent PVCs. 14-day Zio 06/19/2023: HR 32 to 99 bpm, average 61 bpm.  Wenckebach present.  Rare PACs.  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  No sustained arrhythmias. Echo 07/24/2023: EF 55 to 60%.  No RWMA.  Grade I DD.  Normal RV size/function.  Mild MR. Hyperlipidemia. Lipid panel 10/02/2023: Direct LDL 68, HDL 38, TG 114, total 118. OSA. GERD. 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 Gout.  In summary, patient has a history of CAD with BMS placed to RCA and LAD in 2009.  Patient was admitted to the hospital October 2019 for GI bleed complicated by vasovagal cardiac arrest, aspiration pneumonia versus chemical pneumonitis with acute respiratory failure requiring intubation.  He presented to [facility] on 06/25/2018 for chest pressure that developed the night prior.  Upon arrival to the ED he was noted to have hematemesis and hypotension with BP in the 70s systolic.  He suffered a vasovagal cardiac arrest and received CPR x 1 minute prior to improvement in vitals.  He was started on low-dose epi infusion for support and intubated for airway protection.  Interventional cardiology was called for concern of possible STEMI but given relatively benign appearing EKG, normal cardiac enzymes x 1, and hematemesis it was felt to was not a candidate for emergent cath.  Troponin peaked at 0.03.  CTA of chest and abdomen was unremarkable.  Echo demonstrated EF 55 to 60%, mild concentric LVH, no RWMA, no significant  valvular abnormalities.  Nuclear stress test performed on 06/28/2018 was low risk.  Patient required 2 units of PRBC and was discharged with hemoglobin 9.1.  EGD and colonoscopy demonstrated small gastric ulcer and a nonbleeding rectal ulcer.  Blood loss was from suspected Mallory-Weiss tear.  Patient was seen in the clinic in September 2024 and had an EKG that demonstrated frequent PVCs.  He wore a 14-day ZIO that demonstrated 28.6% PVC burden.  Echo in November 2024 showed normal LV/RV function.  Patient underwent ED evaluation for foot pain. Patient's estranged wife was contacted for more information.  She reported being his legal guardian secondary to his dementia.  He was living alone with home health part-time.  Patient's wife set up pills, went grocery shopping and visited him regularly.  She also was providing all transportation for him.  Physical therapy recommended ongoing rehab at [facility].  Unfortunately she had suffered an MI and was having difficulty caring for him.  And he was transferred to long-term care on 08/09/2024.  Patient presented to the ED on 08/17/2024 for 2-day history of coughing up blood.  Workup was unremarkable.  CTA was negative for PE.  Patient was discharged back to SNF to follow-up with PCP as an outpatient.      History of Present Illness    Today, patient ***  CAD S/p BMS to RCA and LAD April 2009.  Nuclear stress test October 2019 was abnormal, low risk study.  Patient*** - Continue aspirin , atorvastatin , as needed SL NTG.  Frequent  PVCs 14-day ZIO October 2024 demonstrated HR 32 to 99 bpm, average 61 bpm, frequent PVCs 28.6% burden.  Echo November 2024 demonstrated normal LV/RV function, Grade I DD, mild MR.  Patient*** - Not on beta-blocker secondary to history of bradycardia. - Decision made for no intervention secondary to patient being asymptomatic.  Hyperlipidemia LDL 68 January 2025, at goal. - Continue atorvastatin . - Continue to follow with  PCP.  ROS: All other systems reviewed and are otherwise negative except as noted in History of Present Illness.  EKGs/Labs Reviewed        08/17/2024: ALT 80; AST 43; BUN 20; Creatinine, Ser 0.98; Potassium 4.4; Sodium 139   08/17/2024: Hemoglobin 11.9; WBC 10.1   08/04/2024: TSH 2.520   No results found for requested labs within last 365 days.  ***  Risk Assessment/Calculations    {Does this patient have ATRIAL FIBRILLATION?[phone]} No BP recorded.  {Refresh Note OR Click here to enter BP  :1}***        Physical Exam    VS:  There were no vitals taken for this visit. , BMI There is no height or weight on file to calculate BMI.  GEN: Well nourished, well developed, in no acute distress. Neck: No JVD or carotid bruits. Cardiac: *** RRR. *** No murmur. No rubs or gallops.   Respiratory:  Respirations regular and unlabored. Clear to auscultation without rales, wheezing or rhonchi. GI: Soft, nontender, nondistended. Extremities: Radials/DP/PT 2+ and equal bilaterally. No clubbing or cyanosis. No edema ***  Skin: Warm and dry, no rash. Neuro: Strength intact.  Assessment & Plan   ***  Disposition: ***     {Are you ordering a CV Procedure (e.g. stress test, cath, DCCV, TEE, etc)?   Press F2        :[id number]}   Signed, Barnie HERO. Zailynn Brandel, DNP, NP-C  "

## 2024-10-12 NOTE — Telephone Encounter (Signed)
 Copied from CRM #[id number]. Topic: General - Other >> Oct 12, 2024 10:02 AM Nessti S wrote: Reason for CRM: lisa called to reschedule pt 12 month f/u but she transferred me to office nurses station with no answer.  E2C2 already rescheduled patient's 52-month follow-up with Dr. Verneita Kettering.  The new date/time is 11/11/2024 at 3pm.

## 2024-10-13 NOTE — Telephone Encounter ([email or web address]
Noted appointment change

## 2024-10-17 ENCOUNTER — Ambulatory Visit: Admitting: Student

## 2024-10-27 ENCOUNTER — Ambulatory Visit: Admitting: Internal Medicine

## 2024-11-11 ENCOUNTER — Ambulatory Visit: Admitting: Internal Medicine

## 2024-11-18 ENCOUNTER — Ambulatory Visit: Admitting: Podiatry

## 2024-12-19 ENCOUNTER — Ambulatory Visit: Admitting: Podiatry
